# Patient Record
Sex: Female | Born: 1950
Health system: Southern US, Community
[De-identification: ages and names within clinical notes are randomized; demographics above are authoritative.]

## PROBLEM LIST (undated history)

## (undated) DIAGNOSIS — J45909 Unspecified asthma, uncomplicated: Secondary | ICD-10-CM

## (undated) DIAGNOSIS — R569 Unspecified convulsions: Secondary | ICD-10-CM

## (undated) DIAGNOSIS — C50919 Malignant neoplasm of unspecified site of unspecified female breast: Secondary | ICD-10-CM

## (undated) DIAGNOSIS — G35 Multiple sclerosis: Secondary | ICD-10-CM

## (undated) DIAGNOSIS — B019 Varicella without complication: Secondary | ICD-10-CM

## (undated) DIAGNOSIS — N183 Chronic kidney disease, stage 3 unspecified: Secondary | ICD-10-CM

## (undated) DIAGNOSIS — I10 Essential (primary) hypertension: Secondary | ICD-10-CM

## (undated) HISTORY — DX: Essential (primary) hypertension: I10

## (undated) HISTORY — DX: Chronic kidney disease, stage 3 unspecified: N18.30

## (undated) HISTORY — DX: Malignant neoplasm of unspecified site of unspecified female breast: C50.919

## (undated) HISTORY — DX: Varicella without complication: B01.9

## (undated) HISTORY — DX: Unspecified convulsions: R56.9

## (undated) HISTORY — DX: Unspecified asthma, uncomplicated: J45.909

## (undated) HISTORY — DX: Multiple sclerosis: G35

## (undated) HISTORY — PX: BREAST SURGERY: SHX581

---

## 2012-08-09 DIAGNOSIS — C50919 Malignant neoplasm of unspecified site of unspecified female breast: Secondary | ICD-10-CM

## 2012-08-09 HISTORY — DX: Malignant neoplasm of unspecified site of unspecified female breast: C50.919

## 2012-08-09 HISTORY — PX: MASTECTOMY: SHX3

## 2012-11-01 DIAGNOSIS — C50411 Malignant neoplasm of upper-outer quadrant of right female breast: Secondary | ICD-10-CM | POA: Insufficient documentation

## 2012-11-02 DIAGNOSIS — Z789 Other specified health status: Secondary | ICD-10-CM | POA: Insufficient documentation

## 2012-11-02 DIAGNOSIS — Z531 Procedure and treatment not carried out because of patient's decision for reasons of belief and group pressure: Secondary | ICD-10-CM | POA: Insufficient documentation

## 2015-06-02 DIAGNOSIS — M792 Neuralgia and neuritis, unspecified: Secondary | ICD-10-CM | POA: Insufficient documentation

## 2015-12-31 DIAGNOSIS — R569 Unspecified convulsions: Secondary | ICD-10-CM | POA: Insufficient documentation

## 2015-12-31 DIAGNOSIS — M79606 Pain in leg, unspecified: Secondary | ICD-10-CM | POA: Insufficient documentation

## 2015-12-31 DIAGNOSIS — G8929 Other chronic pain: Secondary | ICD-10-CM | POA: Insufficient documentation

## 2016-09-22 DIAGNOSIS — G4089 Other seizures: Secondary | ICD-10-CM | POA: Diagnosis not present

## 2016-09-22 DIAGNOSIS — M150 Primary generalized (osteo)arthritis: Secondary | ICD-10-CM | POA: Diagnosis not present

## 2016-09-22 DIAGNOSIS — I10 Essential (primary) hypertension: Secondary | ICD-10-CM | POA: Diagnosis not present

## 2016-09-29 DIAGNOSIS — Z832 Family history of diseases of the blood and blood-forming organs and certain disorders involving the immune mechanism: Secondary | ICD-10-CM | POA: Diagnosis not present

## 2016-09-29 DIAGNOSIS — D573 Sickle-cell trait: Secondary | ICD-10-CM | POA: Diagnosis not present

## 2016-09-29 DIAGNOSIS — Z79811 Long term (current) use of aromatase inhibitors: Secondary | ICD-10-CM | POA: Diagnosis not present

## 2016-09-29 DIAGNOSIS — M8580 Other specified disorders of bone density and structure, unspecified site: Secondary | ICD-10-CM | POA: Diagnosis not present

## 2016-09-29 DIAGNOSIS — C50111 Malignant neoplasm of central portion of right female breast: Secondary | ICD-10-CM | POA: Diagnosis not present

## 2016-09-29 DIAGNOSIS — Z9011 Acquired absence of right breast and nipple: Secondary | ICD-10-CM | POA: Diagnosis not present

## 2016-09-29 DIAGNOSIS — E559 Vitamin D deficiency, unspecified: Secondary | ICD-10-CM | POA: Diagnosis not present

## 2016-09-29 DIAGNOSIS — Z170 Estrogen receptor positive status [ER+]: Secondary | ICD-10-CM | POA: Diagnosis not present

## 2016-09-29 DIAGNOSIS — G35 Multiple sclerosis: Secondary | ICD-10-CM | POA: Diagnosis not present

## 2016-09-29 DIAGNOSIS — C50411 Malignant neoplasm of upper-outer quadrant of right female breast: Secondary | ICD-10-CM | POA: Diagnosis not present

## 2016-09-29 DIAGNOSIS — Z87891 Personal history of nicotine dependence: Secondary | ICD-10-CM | POA: Diagnosis not present

## 2016-09-29 DIAGNOSIS — G893 Neoplasm related pain (acute) (chronic): Secondary | ICD-10-CM | POA: Diagnosis not present

## 2016-09-29 DIAGNOSIS — G40909 Epilepsy, unspecified, not intractable, without status epilepticus: Secondary | ICD-10-CM | POA: Diagnosis not present

## 2016-09-29 DIAGNOSIS — J45909 Unspecified asthma, uncomplicated: Secondary | ICD-10-CM | POA: Diagnosis not present

## 2016-09-29 DIAGNOSIS — D649 Anemia, unspecified: Secondary | ICD-10-CM | POA: Diagnosis not present

## 2016-09-29 DIAGNOSIS — G629 Polyneuropathy, unspecified: Secondary | ICD-10-CM | POA: Diagnosis not present

## 2016-10-05 DIAGNOSIS — L84 Corns and callosities: Secondary | ICD-10-CM | POA: Diagnosis not present

## 2016-10-05 DIAGNOSIS — L608 Other nail disorders: Secondary | ICD-10-CM | POA: Diagnosis not present

## 2016-10-05 DIAGNOSIS — M2041 Other hammer toe(s) (acquired), right foot: Secondary | ICD-10-CM | POA: Diagnosis not present

## 2016-10-05 DIAGNOSIS — M79671 Pain in right foot: Secondary | ICD-10-CM | POA: Diagnosis not present

## 2016-10-15 DIAGNOSIS — Z124 Encounter for screening for malignant neoplasm of cervix: Secondary | ICD-10-CM | POA: Diagnosis not present

## 2016-10-15 DIAGNOSIS — I10 Essential (primary) hypertension: Secondary | ICD-10-CM | POA: Diagnosis not present

## 2016-10-15 DIAGNOSIS — Z853 Personal history of malignant neoplasm of breast: Secondary | ICD-10-CM | POA: Diagnosis not present

## 2016-10-15 DIAGNOSIS — G35 Multiple sclerosis: Secondary | ICD-10-CM | POA: Diagnosis not present

## 2016-12-14 DIAGNOSIS — L84 Corns and callosities: Secondary | ICD-10-CM | POA: Diagnosis not present

## 2016-12-14 DIAGNOSIS — M79671 Pain in right foot: Secondary | ICD-10-CM | POA: Diagnosis not present

## 2016-12-14 DIAGNOSIS — M2041 Other hammer toe(s) (acquired), right foot: Secondary | ICD-10-CM | POA: Diagnosis not present

## 2016-12-14 DIAGNOSIS — L608 Other nail disorders: Secondary | ICD-10-CM | POA: Diagnosis not present

## 2016-12-29 DIAGNOSIS — G35 Multiple sclerosis: Secondary | ICD-10-CM | POA: Diagnosis not present

## 2016-12-29 DIAGNOSIS — I10 Essential (primary) hypertension: Secondary | ICD-10-CM | POA: Diagnosis not present

## 2017-01-19 DIAGNOSIS — M6250 Muscle wasting and atrophy, not elsewhere classified, unspecified site: Secondary | ICD-10-CM | POA: Diagnosis not present

## 2017-01-19 DIAGNOSIS — R2689 Other abnormalities of gait and mobility: Secondary | ICD-10-CM | POA: Diagnosis not present

## 2017-01-19 DIAGNOSIS — G35 Multiple sclerosis: Secondary | ICD-10-CM | POA: Diagnosis not present

## 2017-01-26 DIAGNOSIS — R2689 Other abnormalities of gait and mobility: Secondary | ICD-10-CM | POA: Diagnosis not present

## 2017-01-26 DIAGNOSIS — G35 Multiple sclerosis: Secondary | ICD-10-CM | POA: Diagnosis not present

## 2017-01-26 DIAGNOSIS — M6250 Muscle wasting and atrophy, not elsewhere classified, unspecified site: Secondary | ICD-10-CM | POA: Diagnosis not present

## 2017-02-01 DIAGNOSIS — M6250 Muscle wasting and atrophy, not elsewhere classified, unspecified site: Secondary | ICD-10-CM | POA: Diagnosis not present

## 2017-02-01 DIAGNOSIS — R2689 Other abnormalities of gait and mobility: Secondary | ICD-10-CM | POA: Diagnosis not present

## 2017-02-01 DIAGNOSIS — G35 Multiple sclerosis: Secondary | ICD-10-CM | POA: Diagnosis not present

## 2017-02-08 DIAGNOSIS — G35 Multiple sclerosis: Secondary | ICD-10-CM | POA: Diagnosis not present

## 2017-02-08 DIAGNOSIS — R2689 Other abnormalities of gait and mobility: Secondary | ICD-10-CM | POA: Diagnosis not present

## 2017-02-08 DIAGNOSIS — M6250 Muscle wasting and atrophy, not elsewhere classified, unspecified site: Secondary | ICD-10-CM | POA: Diagnosis not present

## 2017-02-11 DIAGNOSIS — R2689 Other abnormalities of gait and mobility: Secondary | ICD-10-CM | POA: Diagnosis not present

## 2017-02-11 DIAGNOSIS — M6250 Muscle wasting and atrophy, not elsewhere classified, unspecified site: Secondary | ICD-10-CM | POA: Diagnosis not present

## 2017-02-11 DIAGNOSIS — G35 Multiple sclerosis: Secondary | ICD-10-CM | POA: Diagnosis not present

## 2017-02-15 DIAGNOSIS — R2689 Other abnormalities of gait and mobility: Secondary | ICD-10-CM | POA: Diagnosis not present

## 2017-02-15 DIAGNOSIS — G35 Multiple sclerosis: Secondary | ICD-10-CM | POA: Diagnosis not present

## 2017-02-15 DIAGNOSIS — M6250 Muscle wasting and atrophy, not elsewhere classified, unspecified site: Secondary | ICD-10-CM | POA: Diagnosis not present

## 2017-02-18 DIAGNOSIS — R2689 Other abnormalities of gait and mobility: Secondary | ICD-10-CM | POA: Diagnosis not present

## 2017-02-18 DIAGNOSIS — M6250 Muscle wasting and atrophy, not elsewhere classified, unspecified site: Secondary | ICD-10-CM | POA: Diagnosis not present

## 2017-02-18 DIAGNOSIS — G35 Multiple sclerosis: Secondary | ICD-10-CM | POA: Diagnosis not present

## 2017-02-22 DIAGNOSIS — M6250 Muscle wasting and atrophy, not elsewhere classified, unspecified site: Secondary | ICD-10-CM | POA: Diagnosis not present

## 2017-02-22 DIAGNOSIS — G35 Multiple sclerosis: Secondary | ICD-10-CM | POA: Diagnosis not present

## 2017-02-22 DIAGNOSIS — R2689 Other abnormalities of gait and mobility: Secondary | ICD-10-CM | POA: Diagnosis not present

## 2017-02-25 DIAGNOSIS — G35 Multiple sclerosis: Secondary | ICD-10-CM | POA: Diagnosis not present

## 2017-02-25 DIAGNOSIS — R2689 Other abnormalities of gait and mobility: Secondary | ICD-10-CM | POA: Diagnosis not present

## 2017-02-25 DIAGNOSIS — M6250 Muscle wasting and atrophy, not elsewhere classified, unspecified site: Secondary | ICD-10-CM | POA: Diagnosis not present

## 2017-03-31 DIAGNOSIS — C50911 Malignant neoplasm of unspecified site of right female breast: Secondary | ICD-10-CM | POA: Diagnosis not present

## 2017-03-31 DIAGNOSIS — Z853 Personal history of malignant neoplasm of breast: Secondary | ICD-10-CM | POA: Diagnosis not present

## 2017-04-06 DIAGNOSIS — G35 Multiple sclerosis: Secondary | ICD-10-CM | POA: Diagnosis not present

## 2017-04-06 DIAGNOSIS — Z6824 Body mass index (BMI) 24.0-24.9, adult: Secondary | ICD-10-CM | POA: Diagnosis not present

## 2017-04-06 DIAGNOSIS — I10 Essential (primary) hypertension: Secondary | ICD-10-CM | POA: Diagnosis not present

## 2017-04-07 DIAGNOSIS — G893 Neoplasm related pain (acute) (chronic): Secondary | ICD-10-CM | POA: Diagnosis not present

## 2017-04-07 DIAGNOSIS — G8928 Other chronic postprocedural pain: Secondary | ICD-10-CM | POA: Diagnosis not present

## 2017-04-07 DIAGNOSIS — R5383 Other fatigue: Secondary | ICD-10-CM | POA: Diagnosis not present

## 2017-04-07 DIAGNOSIS — C50411 Malignant neoplasm of upper-outer quadrant of right female breast: Secondary | ICD-10-CM | POA: Diagnosis not present

## 2017-04-07 DIAGNOSIS — N6459 Other signs and symptoms in breast: Secondary | ICD-10-CM | POA: Diagnosis not present

## 2017-04-07 DIAGNOSIS — D573 Sickle-cell trait: Secondary | ICD-10-CM | POA: Diagnosis not present

## 2017-04-07 DIAGNOSIS — Z87891 Personal history of nicotine dependence: Secondary | ICD-10-CM | POA: Diagnosis not present

## 2017-04-07 DIAGNOSIS — Z832 Family history of diseases of the blood and blood-forming organs and certain disorders involving the immune mechanism: Secondary | ICD-10-CM | POA: Diagnosis not present

## 2017-04-07 DIAGNOSIS — R208 Other disturbances of skin sensation: Secondary | ICD-10-CM | POA: Diagnosis not present

## 2017-04-07 DIAGNOSIS — M1990 Unspecified osteoarthritis, unspecified site: Secondary | ICD-10-CM | POA: Diagnosis not present

## 2017-04-07 DIAGNOSIS — M8580 Other specified disorders of bone density and structure, unspecified site: Secondary | ICD-10-CM | POA: Diagnosis not present

## 2017-04-07 DIAGNOSIS — J45909 Unspecified asthma, uncomplicated: Secondary | ICD-10-CM | POA: Diagnosis not present

## 2017-04-07 DIAGNOSIS — Z9221 Personal history of antineoplastic chemotherapy: Secondary | ICD-10-CM | POA: Diagnosis not present

## 2017-04-07 DIAGNOSIS — R110 Nausea: Secondary | ICD-10-CM | POA: Diagnosis not present

## 2017-04-07 DIAGNOSIS — Z170 Estrogen receptor positive status [ER+]: Secondary | ICD-10-CM | POA: Diagnosis not present

## 2017-04-07 DIAGNOSIS — C50111 Malignant neoplasm of central portion of right female breast: Secondary | ICD-10-CM | POA: Diagnosis not present

## 2017-04-07 DIAGNOSIS — Z79811 Long term (current) use of aromatase inhibitors: Secondary | ICD-10-CM | POA: Diagnosis not present

## 2017-04-07 DIAGNOSIS — Z9011 Acquired absence of right breast and nipple: Secondary | ICD-10-CM | POA: Diagnosis not present

## 2017-04-07 DIAGNOSIS — D649 Anemia, unspecified: Secondary | ICD-10-CM | POA: Diagnosis not present

## 2017-04-07 DIAGNOSIS — G629 Polyneuropathy, unspecified: Secondary | ICD-10-CM | POA: Diagnosis not present

## 2017-04-07 DIAGNOSIS — G35 Multiple sclerosis: Secondary | ICD-10-CM | POA: Diagnosis not present

## 2017-04-07 DIAGNOSIS — M8589 Other specified disorders of bone density and structure, multiple sites: Secondary | ICD-10-CM | POA: Diagnosis not present

## 2017-04-19 DIAGNOSIS — M47816 Spondylosis without myelopathy or radiculopathy, lumbar region: Secondary | ICD-10-CM | POA: Diagnosis not present

## 2017-04-19 DIAGNOSIS — R109 Unspecified abdominal pain: Secondary | ICD-10-CM | POA: Diagnosis not present

## 2017-04-19 DIAGNOSIS — M5136 Other intervertebral disc degeneration, lumbar region: Secondary | ICD-10-CM | POA: Diagnosis not present

## 2017-04-19 DIAGNOSIS — G8911 Acute pain due to trauma: Secondary | ICD-10-CM | POA: Diagnosis not present

## 2017-04-19 DIAGNOSIS — S2232XA Fracture of one rib, left side, initial encounter for closed fracture: Secondary | ICD-10-CM | POA: Diagnosis not present

## 2017-04-19 DIAGNOSIS — M545 Low back pain: Secondary | ICD-10-CM | POA: Diagnosis not present

## 2017-07-12 DIAGNOSIS — I10 Essential (primary) hypertension: Secondary | ICD-10-CM | POA: Diagnosis not present

## 2017-07-12 DIAGNOSIS — Z23 Encounter for immunization: Secondary | ICD-10-CM | POA: Diagnosis not present

## 2017-07-12 DIAGNOSIS — G35 Multiple sclerosis: Secondary | ICD-10-CM | POA: Diagnosis not present

## 2017-07-12 DIAGNOSIS — Z6823 Body mass index (BMI) 23.0-23.9, adult: Secondary | ICD-10-CM | POA: Diagnosis not present

## 2017-07-14 DIAGNOSIS — Z780 Asymptomatic menopausal state: Secondary | ICD-10-CM | POA: Diagnosis not present

## 2017-07-14 DIAGNOSIS — M810 Age-related osteoporosis without current pathological fracture: Secondary | ICD-10-CM | POA: Diagnosis not present

## 2017-07-14 DIAGNOSIS — M8589 Other specified disorders of bone density and structure, multiple sites: Secondary | ICD-10-CM | POA: Diagnosis not present

## 2017-07-14 DIAGNOSIS — Z13820 Encounter for screening for osteoporosis: Secondary | ICD-10-CM | POA: Diagnosis not present

## 2017-07-14 LAB — HM DEXA SCAN

## 2017-10-06 DIAGNOSIS — M8580 Other specified disorders of bone density and structure, unspecified site: Secondary | ICD-10-CM | POA: Diagnosis not present

## 2017-10-06 DIAGNOSIS — R634 Abnormal weight loss: Secondary | ICD-10-CM | POA: Diagnosis not present

## 2017-10-06 DIAGNOSIS — D573 Sickle-cell trait: Secondary | ICD-10-CM | POA: Diagnosis not present

## 2017-10-06 DIAGNOSIS — Z170 Estrogen receptor positive status [ER+]: Secondary | ICD-10-CM | POA: Diagnosis not present

## 2017-10-06 DIAGNOSIS — R0781 Pleurodynia: Secondary | ICD-10-CM | POA: Diagnosis not present

## 2017-10-06 DIAGNOSIS — R079 Chest pain, unspecified: Secondary | ICD-10-CM | POA: Diagnosis not present

## 2017-10-06 DIAGNOSIS — G893 Neoplasm related pain (acute) (chronic): Secondary | ICD-10-CM | POA: Diagnosis not present

## 2017-10-06 DIAGNOSIS — Z79811 Long term (current) use of aromatase inhibitors: Secondary | ICD-10-CM | POA: Diagnosis not present

## 2017-10-06 DIAGNOSIS — C50411 Malignant neoplasm of upper-outer quadrant of right female breast: Secondary | ICD-10-CM | POA: Diagnosis not present

## 2017-10-19 DIAGNOSIS — G35 Multiple sclerosis: Secondary | ICD-10-CM | POA: Diagnosis not present

## 2017-10-19 DIAGNOSIS — I10 Essential (primary) hypertension: Secondary | ICD-10-CM | POA: Diagnosis not present

## 2017-10-19 DIAGNOSIS — Z6824 Body mass index (BMI) 24.0-24.9, adult: Secondary | ICD-10-CM | POA: Diagnosis not present

## 2017-10-19 DIAGNOSIS — G4089 Other seizures: Secondary | ICD-10-CM | POA: Diagnosis not present

## 2017-10-19 DIAGNOSIS — C50911 Malignant neoplasm of unspecified site of right female breast: Secondary | ICD-10-CM | POA: Diagnosis not present

## 2017-12-08 DIAGNOSIS — J45909 Unspecified asthma, uncomplicated: Secondary | ICD-10-CM | POA: Diagnosis not present

## 2017-12-08 DIAGNOSIS — Z1211 Encounter for screening for malignant neoplasm of colon: Secondary | ICD-10-CM | POA: Diagnosis not present

## 2017-12-08 DIAGNOSIS — F17210 Nicotine dependence, cigarettes, uncomplicated: Secondary | ICD-10-CM | POA: Diagnosis not present

## 2017-12-08 DIAGNOSIS — K5730 Diverticulosis of large intestine without perforation or abscess without bleeding: Secondary | ICD-10-CM | POA: Diagnosis not present

## 2017-12-08 LAB — HM COLONOSCOPY

## 2018-03-12 DIAGNOSIS — K429 Umbilical hernia without obstruction or gangrene: Secondary | ICD-10-CM | POA: Diagnosis not present

## 2018-03-12 DIAGNOSIS — M8580 Other specified disorders of bone density and structure, unspecified site: Secondary | ICD-10-CM | POA: Diagnosis not present

## 2018-03-12 DIAGNOSIS — Z720 Tobacco use: Secondary | ICD-10-CM | POA: Diagnosis not present

## 2018-03-12 DIAGNOSIS — N12 Tubulo-interstitial nephritis, not specified as acute or chronic: Secondary | ICD-10-CM | POA: Diagnosis not present

## 2018-03-12 DIAGNOSIS — H9203 Otalgia, bilateral: Secondary | ICD-10-CM | POA: Diagnosis not present

## 2018-03-12 DIAGNOSIS — D649 Anemia, unspecified: Secondary | ICD-10-CM | POA: Diagnosis not present

## 2018-03-12 DIAGNOSIS — Z853 Personal history of malignant neoplasm of breast: Secondary | ICD-10-CM | POA: Diagnosis not present

## 2018-03-12 DIAGNOSIS — M1990 Unspecified osteoarthritis, unspecified site: Secondary | ICD-10-CM | POA: Diagnosis not present

## 2018-03-12 DIAGNOSIS — G629 Polyneuropathy, unspecified: Secondary | ICD-10-CM | POA: Diagnosis not present

## 2018-03-12 DIAGNOSIS — G35 Multiple sclerosis: Secondary | ICD-10-CM | POA: Diagnosis not present

## 2018-03-12 DIAGNOSIS — J45909 Unspecified asthma, uncomplicated: Secondary | ICD-10-CM | POA: Diagnosis not present

## 2018-03-12 DIAGNOSIS — R109 Unspecified abdominal pain: Secondary | ICD-10-CM | POA: Diagnosis not present

## 2018-03-12 DIAGNOSIS — I10 Essential (primary) hypertension: Secondary | ICD-10-CM | POA: Diagnosis not present

## 2018-03-12 DIAGNOSIS — D571 Sickle-cell disease without crisis: Secondary | ICD-10-CM | POA: Diagnosis not present

## 2018-03-12 DIAGNOSIS — R569 Unspecified convulsions: Secondary | ICD-10-CM | POA: Diagnosis not present

## 2018-03-12 DIAGNOSIS — K449 Diaphragmatic hernia without obstruction or gangrene: Secondary | ICD-10-CM | POA: Diagnosis not present

## 2018-03-12 DIAGNOSIS — N10 Acute tubulo-interstitial nephritis: Secondary | ICD-10-CM | POA: Diagnosis not present

## 2018-04-03 DIAGNOSIS — Z170 Estrogen receptor positive status [ER+]: Secondary | ICD-10-CM | POA: Diagnosis not present

## 2018-04-03 DIAGNOSIS — G629 Polyneuropathy, unspecified: Secondary | ICD-10-CM | POA: Diagnosis not present

## 2018-04-03 DIAGNOSIS — D573 Sickle-cell trait: Secondary | ICD-10-CM | POA: Diagnosis not present

## 2018-04-03 DIAGNOSIS — G893 Neoplasm related pain (acute) (chronic): Secondary | ICD-10-CM | POA: Diagnosis not present

## 2018-04-03 DIAGNOSIS — M8589 Other specified disorders of bone density and structure, multiple sites: Secondary | ICD-10-CM | POA: Diagnosis not present

## 2018-04-03 DIAGNOSIS — J45909 Unspecified asthma, uncomplicated: Secondary | ICD-10-CM | POA: Diagnosis not present

## 2018-04-03 DIAGNOSIS — D649 Anemia, unspecified: Secondary | ICD-10-CM | POA: Diagnosis not present

## 2018-04-03 DIAGNOSIS — C50111 Malignant neoplasm of central portion of right female breast: Secondary | ICD-10-CM | POA: Diagnosis not present

## 2018-04-03 DIAGNOSIS — G35 Multiple sclerosis: Secondary | ICD-10-CM | POA: Diagnosis not present

## 2018-04-03 DIAGNOSIS — M1990 Unspecified osteoarthritis, unspecified site: Secondary | ICD-10-CM | POA: Diagnosis not present

## 2018-04-03 DIAGNOSIS — Z1231 Encounter for screening mammogram for malignant neoplasm of breast: Secondary | ICD-10-CM | POA: Diagnosis not present

## 2018-04-03 DIAGNOSIS — Z79811 Long term (current) use of aromatase inhibitors: Secondary | ICD-10-CM | POA: Diagnosis not present

## 2018-04-03 DIAGNOSIS — Z9011 Acquired absence of right breast and nipple: Secondary | ICD-10-CM | POA: Diagnosis not present

## 2018-04-03 DIAGNOSIS — C50411 Malignant neoplasm of upper-outer quadrant of right female breast: Secondary | ICD-10-CM | POA: Diagnosis not present

## 2018-04-03 DIAGNOSIS — R569 Unspecified convulsions: Secondary | ICD-10-CM | POA: Diagnosis not present

## 2018-04-03 DIAGNOSIS — I10 Essential (primary) hypertension: Secondary | ICD-10-CM | POA: Diagnosis not present

## 2018-04-03 DIAGNOSIS — M8580 Other specified disorders of bone density and structure, unspecified site: Secondary | ICD-10-CM | POA: Diagnosis not present

## 2018-08-26 DIAGNOSIS — G629 Polyneuropathy, unspecified: Secondary | ICD-10-CM | POA: Diagnosis not present

## 2018-08-26 DIAGNOSIS — M19071 Primary osteoarthritis, right ankle and foot: Secondary | ICD-10-CM | POA: Diagnosis not present

## 2018-08-26 DIAGNOSIS — I10 Essential (primary) hypertension: Secondary | ICD-10-CM | POA: Diagnosis not present

## 2018-08-26 DIAGNOSIS — M79671 Pain in right foot: Secondary | ICD-10-CM | POA: Diagnosis not present

## 2018-08-26 DIAGNOSIS — Z87891 Personal history of nicotine dependence: Secondary | ICD-10-CM | POA: Diagnosis not present

## 2018-08-26 DIAGNOSIS — G8911 Acute pain due to trauma: Secondary | ICD-10-CM | POA: Diagnosis not present

## 2018-08-26 DIAGNOSIS — S9031XA Contusion of right foot, initial encounter: Secondary | ICD-10-CM | POA: Diagnosis not present

## 2018-08-26 DIAGNOSIS — Z7982 Long term (current) use of aspirin: Secondary | ICD-10-CM | POA: Diagnosis not present

## 2018-08-26 DIAGNOSIS — G35 Multiple sclerosis: Secondary | ICD-10-CM | POA: Diagnosis not present

## 2018-08-26 DIAGNOSIS — Z79899 Other long term (current) drug therapy: Secondary | ICD-10-CM | POA: Diagnosis not present

## 2018-09-15 ENCOUNTER — Other Ambulatory Visit: Payer: Self-pay

## 2018-09-15 ENCOUNTER — Ambulatory Visit (INDEPENDENT_AMBULATORY_CARE_PROVIDER_SITE_OTHER): Payer: Medicare Other | Admitting: Family Medicine

## 2018-09-15 ENCOUNTER — Encounter: Payer: Self-pay | Admitting: Family Medicine

## 2018-09-15 VITALS — BP 152/75 | HR 79 | Temp 97.7°F | Ht 66.0 in | Wt 145.6 lb

## 2018-09-15 DIAGNOSIS — C50911 Malignant neoplasm of unspecified site of right female breast: Secondary | ICD-10-CM

## 2018-09-15 DIAGNOSIS — I10 Essential (primary) hypertension: Secondary | ICD-10-CM | POA: Diagnosis not present

## 2018-09-15 DIAGNOSIS — Z23 Encounter for immunization: Secondary | ICD-10-CM

## 2018-09-15 DIAGNOSIS — G40109 Localization-related (focal) (partial) symptomatic epilepsy and epileptic syndromes with simple partial seizures, not intractable, without status epilepticus: Secondary | ICD-10-CM | POA: Insufficient documentation

## 2018-09-15 DIAGNOSIS — R269 Unspecified abnormalities of gait and mobility: Secondary | ICD-10-CM | POA: Insufficient documentation

## 2018-09-15 DIAGNOSIS — G35 Multiple sclerosis: Secondary | ICD-10-CM | POA: Diagnosis not present

## 2018-09-15 DIAGNOSIS — F40240 Claustrophobia: Secondary | ICD-10-CM | POA: Insufficient documentation

## 2018-09-15 DIAGNOSIS — R202 Paresthesia of skin: Secondary | ICD-10-CM | POA: Insufficient documentation

## 2018-09-15 DIAGNOSIS — Z5181 Encounter for therapeutic drug level monitoring: Secondary | ICD-10-CM | POA: Diagnosis not present

## 2018-09-15 MED ORDER — GABAPENTIN 300 MG PO CAPS: 600.0000 mg | ORAL_CAPSULE | Freq: Two times a day (BID) | ORAL | 3 refills | Status: DC

## 2018-09-15 MED ORDER — LAMOTRIGINE 100 MG PO TABS: 100.0000 mg | ORAL_TABLET | Freq: Every day | ORAL | 0 refills | Status: DC

## 2018-09-15 MED ORDER — OXYCODONE-ACETAMINOPHEN 5-325 MG PO TABS: 1.0000 | ORAL_TABLET | Freq: Three times a day (TID) | ORAL | 0 refills | Status: DC | PRN

## 2018-09-15 MED ORDER — LOSARTAN POTASSIUM 100 MG PO TABS: 100.0000 mg | ORAL_TABLET | Freq: Every day | ORAL | 1 refills | Status: DC

## 2018-09-15 MED ORDER — VITAMIN D3 25 MCG (1000 UNIT) PO TABS: 1000.0000 [IU] | ORAL_TABLET | Freq: Every day | ORAL | 3 refills | Status: DC

## 2018-09-15 MED ORDER — AMLODIPINE BESYLATE 5 MG PO TABS: 5.0000 mg | ORAL_TABLET | Freq: Every day | ORAL | 1 refills | Status: DC

## 2018-09-15 NOTE — Patient Instructions (Signed)
° ° ° °  If you have lab work done today you will be contacted with your lab results within the next 2 weeks.  If you have not heard from us then please contact us. The fastest way to get your results is to register for My Chart. ° ° °IF you received an x-ray today, you will receive an invoice from [facility]. Please contact [facility] at [phone] with questions or concerns regarding your invoice.  ° °IF you received labwork today, you will receive an invoice from [facility]. Please contact [facility] at [phone] with questions or concerns regarding your invoice.  ° °Our billing staff will not be able to assist you with questions regarding bills from these companies. ° °You will be contacted with the lab results as soon as they are available. The fastest way to get your results is to activate your My Chart account. Instructions are located on the last page of this paperwork. If you have not heard from us regarding the results in 2 weeks, please contact this office. °  ° ° ° °

## 2018-09-15 NOTE — Progress Notes (Signed)
2/7/202011:47 AM  [place] [birth year], 68 y.o. female [id number]  Chief Complaint  Patient presents with  . Establish Care    needs refill on all meds, moving to [place] from [place]. Will sign consent for records    HPI:   Patient is a 68 y.o. female with past medical history significant for HTN, breast cancer right in 11-02-12, seizure disorder, Multiple sclerosis who presents today for to establish care  Moving from [place] to [place] to be closer to family After her husband died in 02-Nov-2016 Has a daughter in [place] and a son in [place]  Had [place] and chemo x 1 year for breast cancer, right Last mammogram in 2017-11-02 Sees onc feb 25th in [place], will need referral to med onc  seziures started couple years before MS was diagnosed Trial off lamictal lead to return of seizures Has not had seizure since she resumed lamictal  Used to be on copaxone for MS, had issues with pharmaceutical Last MS meds about 3 years ago Last MRI about 2 years ago, needs valium before MRI Last MRI was stable Uses cane Has nueropathic pain from MS, mostly right leg No known relapse  Takes oxycodone mostly for sign pain at masectomy scar and it also helps with her neuropathic pain Takes prn pmp reviewed  HTN - has been losartan for about 3 years Took BP med today Checks BP occ at home, reports 150/70s Has never tried any other BP meds Mother and sister both have HTN and ESRD on HD Patient has been told she has decreased kidney function  Requesting flu vaccine, gets every year  Fall Risk  09/15/2018  Falls in the past year? 0  Number falls in past yr: 0  Injury with Fall? 0  Risk for fall due to : Impaired vision;Impaired balance/gait     Depression screen [facility] 2/9 09/15/2018  Decreased Interest 0  Down, Depressed, Hopeless 0  PHQ - 2 Score 0    No Known Allergies  Prior to Admission medications   Not on File  meds reviewed and updated in epic  Past Medical History:    Diagnosis Date  . Breast cancer ([place])   . Hypertension   . Multiple sclerosis ([place])   . Seizures ([place])     Past Surgical History:  Procedure Laterality Date  . BREAST SURGERY      Social History   Tobacco Use  . Smoking status: Not on file  Substance Use Topics  . Alcohol use: Not on file    No family history on file.  Review of Systems  Constitutional: Negative for chills and fever.  Eyes: Negative for blurred vision and double vision.  Respiratory: Negative for cough and shortness of breath.   Cardiovascular: Negative for chest pain, palpitations and leg swelling.  Gastrointestinal: Negative for abdominal pain, nausea and vomiting.  Neurological: Positive for tingling, focal weakness and seizures.  All other systems reviewed and are negative. per hpi  OBJECTIVE:  Blood pressure (!) 152/75, pulse 79, temperature 97.7 F (36.5 C), temperature source Oral, height 5\' 6"  (1.676 m), weight 145 lb 9.6 oz (66 kg), SpO2 98 %. Body mass index is 23.5 kg/m.   Physical Exam Vitals signs and nursing note reviewed.  Constitutional:      General: She is not in acute distress.    Appearance: She is well-developed.  HENT:     Head: Normocephalic and atraumatic.     Mouth/Throat:     Pharynx: No oropharyngeal exudate.  Eyes:     General: No scleral icterus.    Conjunctiva/sclera: Conjunctivae normal.     Pupils: Pupils are equal, round, and reactive to light.  Neck:     Musculoskeletal: Neck supple.  Cardiovascular:     Rate and Rhythm: Normal rate and regular rhythm.     Heart sounds: Normal heart sounds. No murmur. No friction rub. No gallop.   Pulmonary:     Effort: Pulmonary effort is normal.     Breath sounds: Normal breath sounds. No wheezing or rales.  Abdominal:     General: Bowel sounds are normal. There is no distension.     Palpations: Abdomen is soft. There is no mass.     Tenderness: There is no abdominal tenderness.  Musculoskeletal:     Right lower  leg: No edema.     Left lower leg: No edema.  Lymphadenopathy:     Cervical: No cervical adenopathy.  Skin:    General: Skin is warm and dry.  Neurological:     Mental Status: She is alert and oriented to person, place, and time.     Gait: Gait abnormal.  Psychiatric:        Mood and Affect: Mood normal.     ASSESSMENT and PLAN  1. Essential hypertension Uncontrolled. Adding amlodipine, reviewed r/se/b. Cont losartan.  - CBC - Comprehensive metabolic panel - Lipid panel - TSH  2. Need for prophylactic vaccination and inoculation against influenza - Flu vaccine HIGH DOSE PF  3. Localization-related epilepsy ([place]) Stable on lamictal. Checking labs. Med refilled. Referral to neuro - CBC - Comprehensive metabolic panel - Lamotrigine level - Ambulatory referral to Neurology  4. Multiple sclerosis ([place]) Patient currently not on any medications. Referring to neuro.  Refilled gabapentin  - Ambulatory referral to Neurology  5. Malignant neoplasm of right female breast, unspecified estrogen receptor status, unspecified site of breast [facility]) Patient has upcoming appt with current onc. Referring so that patient may establish locally, referred percocet, takes very prn, pmp reviewed - Ambulatory referral to Hematology / Oncology  6. Medication monitoring encounter - CBC - Comprehensive metabolic panel - Lamotrigine level  PMH, PSH, meds, allergies, Fhx, Shx, reviewed with patient today.  Other orders - gabapentin (NEURONTIN) 300 MG capsule; Take 2 capsules (600 mg total) by mouth 2 (two) times daily. - oxyCODONE-acetaminophen (PERCOCET/ROXICET) 5-325 MG tablet; Take 1 tablet by mouth every 8 (eight) hours as needed for severe pain. - lamoTRIgine (LAMICTAL) 100 MG tablet; Take 1 tablet (100 mg total) by mouth daily. - losartan (COZAAR) 100 MG tablet; Take 1 tablet (100 mg total) by mouth daily. - amLODipine (NORVASC) 5 MG tablet; Take 1 tablet (5 mg total) by mouth daily.     Return in about 3 months (around 12/14/2018).    Rutherford Guys, MD Primary Care at [place], [place] [address] Ph.  [phone] Fax [phone]

## 2018-09-18 ENCOUNTER — Telehealth: Payer: Self-pay | Admitting: Hematology and Oncology

## 2018-09-18 LAB — LIPID PANEL
Chol/HDL Ratio: 2.3 ratio (ref 0.0–4.4)
Cholesterol, Total: 175 mg/dL (ref 100–199)
HDL: 76 mg/dL (ref >39)
LDL Calculated: 87 mg/dL (ref 0–99)
Triglycerides: 60 mg/dL (ref 0–149)
VLDL Cholesterol Cal: 12 mg/dL (ref 5–40)

## 2018-09-18 LAB — COMPREHENSIVE METABOLIC PANEL
ALT: 12 IU/L (ref 0–32)
AST: 20 IU/L (ref 0–40)
Albumin/Globulin Ratio: 1.4 (ref 1.2–2.2)
Albumin: 4.2 g/dL (ref 3.8–4.8)
Alkaline Phosphatase: 54 IU/L (ref 39–117)
BUN/Creatinine Ratio: 15 (ref 12–28)
BUN: 20 mg/dL (ref 8–27)
Bilirubin Total: 0.5 mg/dL (ref 0.0–1.2)
CO2: 18 mmol/L — ABNORMAL LOW (ref 20–29)
Calcium: 9.6 mg/dL (ref 8.7–10.3)
Chloride: 106 mmol/L (ref 96–106)
Creatinine, Ser: 1.37 mg/dL — ABNORMAL HIGH (ref 0.57–1.00)
GFR calc Af Amer: 46 mL/min/{1.73_m2} — ABNORMAL LOW (ref >59)
GFR calc non Af Amer: 40 mL/min/{1.73_m2} — ABNORMAL LOW (ref >59)
Globulin, Total: 3 g/dL (ref 1.5–4.5)
Glucose: 82 mg/dL (ref 65–99)
Potassium: 4.5 mmol/L (ref 3.5–5.2)
Sodium: 141 mmol/L (ref 134–144)
Total Protein: 7.2 g/dL (ref 6.0–8.5)

## 2018-09-18 LAB — TSH: TSH: 0.583 u[IU]/mL (ref 0.450–4.500)

## 2018-09-18 LAB — CBC
Hematocrit: 33.3 % — ABNORMAL LOW (ref 34.0–46.6)
Hemoglobin: 11.3 g/dL (ref 11.1–15.9)
MCH: 27.6 pg (ref 26.6–33.0)
MCHC: 33.9 g/dL (ref 31.5–35.7)
MCV: 81 fL (ref 79–97)
Platelets: 204 10*3/uL (ref 150–450)
RBC: 4.09 x10E6/uL (ref 3.77–5.28)
RDW: 13.9 % (ref 11.7–15.4)
WBC: 5.6 10*3/uL (ref 3.4–10.8)

## 2018-09-18 LAB — LAMOTRIGINE LEVEL: Lamotrigine Lvl: 6.3 ug/mL (ref 2.0–20.0)

## 2018-09-18 NOTE — Telephone Encounter (Signed)
Received a new patient appt has been scheduled for the pt to see Dr. Lindi Adie on 3/4 at 1pm. Pt aware to arrive 30 minutes early.

## 2018-10-04 DIAGNOSIS — Z79811 Long term (current) use of aromatase inhibitors: Secondary | ICD-10-CM | POA: Diagnosis not present

## 2018-10-04 DIAGNOSIS — G893 Neoplasm related pain (acute) (chronic): Secondary | ICD-10-CM | POA: Diagnosis not present

## 2018-10-04 DIAGNOSIS — I10 Essential (primary) hypertension: Secondary | ICD-10-CM | POA: Diagnosis not present

## 2018-10-04 DIAGNOSIS — M1990 Unspecified osteoarthritis, unspecified site: Secondary | ICD-10-CM | POA: Diagnosis not present

## 2018-10-04 DIAGNOSIS — Z7982 Long term (current) use of aspirin: Secondary | ICD-10-CM | POA: Diagnosis not present

## 2018-10-04 DIAGNOSIS — Z170 Estrogen receptor positive status [ER+]: Secondary | ICD-10-CM | POA: Diagnosis not present

## 2018-10-04 DIAGNOSIS — Z87891 Personal history of nicotine dependence: Secondary | ICD-10-CM | POA: Diagnosis not present

## 2018-10-04 DIAGNOSIS — J45909 Unspecified asthma, uncomplicated: Secondary | ICD-10-CM | POA: Diagnosis not present

## 2018-10-04 DIAGNOSIS — M8580 Other specified disorders of bone density and structure, unspecified site: Secondary | ICD-10-CM | POA: Diagnosis not present

## 2018-10-04 DIAGNOSIS — G35 Multiple sclerosis: Secondary | ICD-10-CM | POA: Diagnosis not present

## 2018-10-04 DIAGNOSIS — Z9011 Acquired absence of right breast and nipple: Secondary | ICD-10-CM | POA: Diagnosis not present

## 2018-10-04 DIAGNOSIS — D573 Sickle-cell trait: Secondary | ICD-10-CM | POA: Diagnosis not present

## 2018-10-04 DIAGNOSIS — C50411 Malignant neoplasm of upper-outer quadrant of right female breast: Secondary | ICD-10-CM | POA: Diagnosis not present

## 2018-10-11 ENCOUNTER — Ambulatory Visit: Payer: Self-pay | Admitting: Hematology and Oncology

## 2018-10-11 NOTE — Assessment & Plan Note (Signed)
At [facility]  11/02/2012:Mastectomy simple Right and axillary lymph node dissection: Grade 3 IDC with DCIS 1.8 cm, 0/3 lymph nodes negative, ER PR positive HER-2 -3+ by IHC, T1c N0 stage Ia  Taxol Herceptin weekly x12 followed by Herceptin maintenance for 1 year Current treatment:  Cancer surveillance: 1.  Mammogram left breast 2.  Breast exam 10/11/2018: Benign  Return to clinic in 1 year for follow-up

## 2018-10-12 ENCOUNTER — Encounter: Payer: Self-pay | Admitting: Neurology

## 2018-11-22 ENCOUNTER — Telehealth: Payer: Self-pay | Admitting: Family Medicine

## 2018-11-22 MED ORDER — OXYCODONE-ACETAMINOPHEN 5-325 MG PO TABS: 1.0000 | ORAL_TABLET | Freq: Three times a day (TID) | ORAL | 0 refills | Status: DC | PRN

## 2018-11-22 NOTE — Telephone Encounter (Signed)
pmp reviewed  Med refilled 

## 2018-11-22 NOTE — Telephone Encounter (Signed)
Copied from [place] [id number]. Topic: Quick Communication - Rx Refill/Question >> Nov 22, 2018  4:02 PM Nils Flack, Marland Kitchen wrote: Medication: oxyCODONE-acetaminophen (PERCOCET/ROXICET) 5-325 MG tablet  Has the patient contacted their pharmacy? yes (Agent: If no, request that the patient contact the pharmacy for the refill.) (Agent: If yes, when and what did the pharmacy advise?)  Preferred Pharmacy (with phone number or street name): [facility] [address]   Agent: Please be advised that RX refills may take up to 3 business days. We ask that you follow-up with your pharmacy.

## 2018-11-22 NOTE — Telephone Encounter (Signed)
Please refill is appropriate 

## 2018-12-01 ENCOUNTER — Encounter: Payer: Self-pay | Admitting: Neurology

## 2018-12-06 ENCOUNTER — Other Ambulatory Visit: Payer: Self-pay | Admitting: Family Medicine

## 2018-12-12 ENCOUNTER — Ambulatory Visit: Payer: Self-pay | Admitting: Neurology

## 2018-12-15 ENCOUNTER — Ambulatory Visit: Payer: Medicare Other | Admitting: Family Medicine

## 2018-12-15 ENCOUNTER — Ambulatory Visit: Payer: Self-pay | Admitting: Neurology

## 2019-01-05 ENCOUNTER — Telehealth (INDEPENDENT_AMBULATORY_CARE_PROVIDER_SITE_OTHER): Payer: Medicare Other | Admitting: Family Medicine

## 2019-01-05 ENCOUNTER — Other Ambulatory Visit: Payer: Self-pay

## 2019-01-05 DIAGNOSIS — C50911 Malignant neoplasm of unspecified site of right female breast: Secondary | ICD-10-CM

## 2019-01-05 DIAGNOSIS — M792 Neuralgia and neuritis, unspecified: Secondary | ICD-10-CM

## 2019-01-05 DIAGNOSIS — I10 Essential (primary) hypertension: Secondary | ICD-10-CM | POA: Diagnosis not present

## 2019-01-05 DIAGNOSIS — G35 Multiple sclerosis: Secondary | ICD-10-CM

## 2019-01-05 DIAGNOSIS — G40109 Localization-related (focal) (partial) symptomatic epilepsy and epileptic syndromes with simple partial seizures, not intractable, without status epilepticus: Secondary | ICD-10-CM

## 2019-01-05 DIAGNOSIS — Z5181 Encounter for therapeutic drug level monitoring: Secondary | ICD-10-CM

## 2019-01-05 MED ORDER — LOSARTAN POTASSIUM 100 MG PO TABS: 100.0000 mg | ORAL_TABLET | Freq: Every day | ORAL | 1 refills | Status: DC

## 2019-01-05 MED ORDER — OXYCODONE-ACETAMINOPHEN 5-325 MG PO TABS: 1.0000 | ORAL_TABLET | Freq: Three times a day (TID) | ORAL | 0 refills | Status: DC | PRN

## 2019-01-05 MED ORDER — LORATADINE 10 MG PO TABS: 10.0000 mg | ORAL_TABLET | Freq: Every day | ORAL | 11 refills | Status: DC

## 2019-01-05 MED ORDER — LAMOTRIGINE 100 MG PO TABS: 100.0000 mg | ORAL_TABLET | Freq: Every day | ORAL | 0 refills | Status: DC

## 2019-01-05 NOTE — Progress Notes (Signed)
Virtual Visit Note  I connected with patient on 01/05/19 at 1238pm by phone and verified that I am speaking with the correct person using two identifiers. Selena Bender is currently located at home and patient is currently with them during visit. The provider, Rutherford Guys, MD is located in their office at time of visit.  I discussed the limitations, risks, security and privacy concerns of performing an evaluation and management service by telephone and the availability of in person appointments. I also discussed with the patient that there may be a patient responsible charge related to this service. The patient expressed understanding and agreed to proceed.   CC: routine followup  HPI ? Patient is a 68 y.o. female with past medical history significant for HTN, breast cancer right in 2014, seizure disorder, Multiple sclerosis who presents today for routine followup  Last OV feb 2020 Added amlodipine Referred to neuro and onc  Has not completed move to [place] Requesting we change her address to [place] (her cousin) were she is staying currently  Saw onc in march 2020, stable, followup 1 year Has not seen neurology yet, has appt in June  Checks BP at home Has been doing well, < 140/90 Having intermittent swelling with amlodipine of left ankle She does not want to change meds at this time  Chronic pain from MS and breast cancer scar Takes gabapentin and oxycodone only as needed Works for her pmp reviewed  Has not had any seizures  Requesting medication for seasonal allergies Has done well with loratidine in the past  Last labs in feb 2020 unremarkable x GFR 46  No Known Allergies  Prior to Admission medications   Medication Sig Start Date End Date Taking? Authorizing Provider  amLODipine (NORVASC) 5 MG tablet Take 1 tablet (5 mg total) by mouth daily. 09/15/18   Rutherford Guys, MD  ascorbic acid (VITAMIN C) 500 MG/5ML syrup Take by mouth daily.    [provider]  cholecalciferol (VITAMIN D) 25 MCG (1000 UT) tablet Take 1 tablet (1,000 Units total) by mouth daily. 09/15/18   Rutherford Guys, MD  gabapentin (NEURONTIN) 300 MG capsule Take 2 capsules (600 mg total) by mouth 2 (two) times daily. 09/15/18   Rutherford Guys, MD  lamoTRIgine (LAMICTAL) 100 MG tablet TAKE 1 TABLET BY MOUTH EVERY DAY 12/07/18   Rutherford Guys, MD  losartan (COZAAR) 100 MG tablet Take 1 tablet (100 mg total) by mouth daily. 09/15/18   Rutherford Guys, MD  oxyCODONE-acetaminophen (PERCOCET/ROXICET) 5-325 MG tablet Take 1 tablet by mouth every 8 (eight) hours as needed for severe pain. 11/22/18   Rutherford Guys, MD    Past Medical History:  Diagnosis Date  . Breast cancer ([place])   . Hypertension   . Multiple sclerosis ([place])   . Seizures ([place])     Past Surgical History:  Procedure Laterality Date  . BREAST SURGERY      Social History   Tobacco Use  . Smoking status: Former Smoker    Types: Cigarettes  . Smokeless tobacco: Never Used  Substance Use Topics  . Alcohol use: Yes    Family History  Problem Relation Age of Onset  . Sickle cell anemia Father     Review of Systems  Constitutional: Negative for chills and fever.  Respiratory: Negative for cough and shortness of breath.   Cardiovascular: Negative for chest pain, palpitations and leg swelling.  Gastrointestinal: Negative for abdominal pain, nausea and vomiting.  Objective  Vitals as reported by the patient: none   ASSESSMENT and PLAN  1. Essential hypertension Controlled. Discussed changing amlodipine due to swelling, at this time declines, as not constant and BP well controlled.  2. Localization-related epilepsy ([place]) Controlled. Continue current regime. lamictal level at goal in feb 2020.  3. Neuropathic pain Controlled. Continue current regime. pmp reviewed. Med refilled.  4. Multiple sclerosis ([place]) Has upcoming appt with neuro to establish care  5. Malignant neoplasm of right  female breast, unspecified estrogen receptor status, unspecified site of breast ([place]) Under onc care, seen once a year.   6. Medication monitoring encounter  Other orders - oxyCODONE-acetaminophen (PERCOCET/ROXICET) 5-325 MG tablet; Take 1 tablet by mouth every 8 (eight) hours as needed for severe pain. - losartan (COZAAR) 100 MG tablet; Take 1 tablet (100 mg total) by mouth daily. - lamoTRIgine (LAMICTAL) 100 MG tablet; Take 1 tablet (100 mg total) by mouth daily. - loratadine (CLARITIN) 10 MG tablet; Take 1 tablet (10 mg total) by mouth daily.  FOLLOW-UP: 3 months   The above assessment and management plan was discussed with the patient. The patient verbalized understanding of and has agreed to the management plan. Patient is aware to call the clinic if symptoms persist or worsen. Patient is aware when to return to the clinic for a follow-up visit. Patient educated on when it is appropriate to go to the emergency department.    I provided 22 minutes of non-face-to-face time during this encounter.  Rutherford Guys, MD Primary Care at [facility] [place], [place] [address] Ph.  [phone] Fax [phone]

## 2019-01-05 NOTE — Progress Notes (Signed)
Pt is following up with htn, nerve pain and and epilepsy. Pt is having swelling in the left ankle that is getting worse with the amlodipine she says. She is not experiencing any anxiety or depression. The gabapentin is not really working as well, she is using the oxycodone 5--325mg  for the pain

## 2019-01-10 ENCOUNTER — Telehealth: Payer: Self-pay | Admitting: Family Medicine

## 2019-01-10 NOTE — Telephone Encounter (Signed)
Called pt and LVM to call office back about medication. Informed pt that her medication is not on the recall list and if she has anymore questions concerning her Rx to call us back.

## 2019-01-10 NOTE — Telephone Encounter (Signed)
Copied from [place] [id number]. Topic: General - Other >> Jan 10, 2019  2:49 PM Lennox Solders wrote: Reason for CRM: Pt is calling and there is a recall on losartan and pt would like different medication. [facility] [address]

## 2019-01-11 NOTE — Telephone Encounter (Signed)
Patient returning call to [place]. Patient states that she is unable to pick up this medication from pharmacy. She is requesting a call back from CMA to discuss further. Please advise.

## 2019-01-12 NOTE — Telephone Encounter (Signed)
Pt states the  losartan (COZAAR) 100 MG tablet has been recalled and the pharmacy does not have. Pt needs a new medication prescribed.  [id number] [address], [place] [phone] (Phone) [phone] (Fax)   Would like to pick up today if possible

## 2019-01-12 NOTE — Telephone Encounter (Signed)
Is there and alternative medication she can be prescribed for her B/P.

## 2019-01-15 MED ORDER — OLMESARTAN MEDOXOMIL 40 MG PO TABS: 40.0000 mg | ORAL_TABLET | Freq: Every day | ORAL | 3 refills | Status: DC

## 2019-01-15 NOTE — Telephone Encounter (Signed)
Please let patient know that I sent in a prescription for olmesartan 40mg  once a day. thanks

## 2019-01-15 NOTE — Telephone Encounter (Signed)
Called pt and informed her that a new Rx has been sent to pharmacy.

## 2019-01-18 NOTE — Progress Notes (Deleted)
NEUROLOGY CONSULTATION NOTE  Selena Bender MRN: [id number] DOB: Jan 11, 1951  Referring provider: Grant Fontana, MD Primary care provider: Grant Fontana, MD  Reason for consult:  MS, epilepsy  HISTORY OF PRESENT ILLNESS: Selena Bender is a 68 year old black woman with multiple sclerosis, seizure disorder, hypertension and history of breast cancer who presents for MS and seizure disorder.  History supplemented by prior neurology and referring provider notes.  In the 1980s, she had ***.    Vision:  *** Motor:  *** Sensory:  *** Pain:  *** Gait:  *** Bowel/Bladder:  *** Fatigue:  *** Cognition:  *** Mood:  ***  Current disease modifying therapy:  *** Current antiepileptic medication:  Lamotrigine 100mg  daily Other current medications:  Gabapentin 600mg  twice daily, Percocet Vitamins:  D 1000 IU daily  Past disease modifying therapy:  *** Past antiepileptic medications:  *** Other past medications:  ***  MRI brain with and without contrast from 10/25/12 demonstrated "multiple perpendicularly oriented periventricular areas of T2/FLAIR hyperintense signal with dark T1 signal, compatible with chronic lesions of multiple sclerosis.  There is also characteristic involvement of the corpus callosum.  Mild encephalomalacia in the left parietal lobe is noted.  09/15/18 LABS:  CBC with WBC 5.6, HGB 11.3, HCT 33.3, PLT 204; CMP with Na 141, K 4.5, Cl 106, CO2 18, glucose 82, BUN 20, Cr 1.37, t bili 0.5, ALP 54, AST 20, ALT 12; lamotrigine level 6.3; TSH 0.583.  PAST MEDICAL HISTORY: Past Medical History:  Diagnosis Date  . Breast cancer ([place])   . Hypertension   . Multiple sclerosis ([place])   . Seizures ([place])     PAST SURGICAL HISTORY: Past Surgical History:  Procedure Laterality Date  . BREAST SURGERY      MEDICATIONS: Current Outpatient Medications on File Prior to Visit  Medication Sig Dispense Refill  . amLODipine (NORVASC) 5 MG tablet Take 1 tablet (5 mg total)  by mouth daily. 90 tablet 1  . ascorbic acid (VITAMIN C) 500 MG/5ML syrup Take by mouth daily.    . cholecalciferol (VITAMIN D) 25 MCG (1000 UT) tablet Take 1 tablet (1,000 Units total) by mouth daily. 90 tablet 3  . gabapentin (NEURONTIN) 300 MG capsule Take 2 capsules (600 mg total) by mouth 2 (two) times daily. 120 capsule 3  . lamoTRIgine (LAMICTAL) 100 MG tablet Take 1 tablet (100 mg total) by mouth daily. 90 tablet 0  . loratadine (CLARITIN) 10 MG tablet Take 1 tablet (10 mg total) by mouth daily. 30 tablet 11  . olmesartan (BENICAR) 40 MG tablet Take 1 tablet (40 mg total) by mouth daily. 90 tablet 3  . oxyCODONE-acetaminophen (PERCOCET/ROXICET) 5-325 MG tablet Take 1 tablet by mouth every 8 (eight) hours as needed for severe pain. 90 tablet 0   No current facility-administered medications on file prior to visit.     ALLERGIES: No Known Allergies  FAMILY HISTORY: Family History  Problem Relation Age of Onset  . Sickle cell anemia Father    ***.  SOCIAL HISTORY: Social History   Socioeconomic History  . Marital status: Widowed    Spouse name: Not on file  . Number of children: Not on file  . Years of education: Not on file  . Highest education level: Not on file  Occupational History  . Not on file  Social Needs  . Financial resource strain: Not on file  . Food insecurity    Worry: Not on file    Inability: Not on  file  . Transportation needs    Medical: Not on file    Non-medical: Not on file  Tobacco Use  . Smoking status: Former Smoker    Types: Cigarettes  . Smokeless tobacco: Never Used  Substance and Sexual Activity  . Alcohol use: Yes  . Drug use: Never  . Sexual activity: Not Currently  Lifestyle  . Physical activity    Days per week: Not on file    Minutes per session: Not on file  . Stress: Not on file  Relationships  . Social Herbalist on phone: Not on file    Gets together: Not on file    Attends religious service: Not on file     Active member of club or organization: Not on file    Attends meetings of clubs or organizations: Not on file    Relationship status: Not on file  . Intimate partner violence    Fear of current or ex partner: Not on file    Emotionally abused: Not on file    Physically abused: Not on file    Forced sexual activity: Not on file  Other Topics Concern  . Not on file  Social History Narrative  . Not on file    REVIEW OF SYSTEMS: Constitutional: No fevers, chills, or sweats, no generalized fatigue, change in appetite Eyes: No visual changes, double vision, eye pain Ear, nose and throat: No hearing loss, ear pain, nasal congestion, sore throat Cardiovascular: No chest pain, palpitations Respiratory:  No shortness of breath at rest or with exertion, wheezes GastrointestinaI: No nausea, vomiting, diarrhea, abdominal pain, fecal incontinence Genitourinary:  No dysuria, urinary retention or frequency Musculoskeletal:  No neck pain, back pain Integumentary: No rash, pruritus, skin lesions Neurological: as above Psychiatric: No depression, insomnia, anxiety Endocrine: No palpitations, fatigue, diaphoresis, mood swings, change in appetite, change in weight, increased thirst Hematologic/Lymphatic:  No purpura, petechiae. Allergic/Immunologic: no itchy/runny eyes, nasal congestion, recent allergic reactions, rashes  PHYSICAL EXAM: *** General: No acute distress.  Patient appears ***-groomed.  *** Head:  Normocephalic/atraumatic Eyes:  fundi examined but not visualized Neck: supple, no paraspinal tenderness, full range of motion Back: No paraspinal tenderness Heart: regular rate and rhythm Lungs: Clear to auscultation bilaterally. Vascular: No carotid bruits. Neurological Exam: Mental status: alert and oriented to person, place, and time, recent and remote memory intact, fund of knowledge intact, attention and concentration intact, speech fluent and not dysarthric, language intact. Cranial  nerves: CN I: not tested CN II: pupils equal, round and reactive to light, visual fields intact CN III, IV, VI:  full range of motion, no nystagmus, no ptosis CN V: facial sensation intact CN VII: upper and lower face symmetric CN VIII: hearing intact CN IX, X: gag intact, uvula midline CN XI: sternocleidomastoid and trapezius muscles intact CN XII: tongue midline Bulk & Tone: normal, no fasciculations. Motor:  5/5 throughout *** Sensation:  [place] *** temperature *** and vibration sensation intact.  ***. Deep Tendon Reflexes:  2+ throughout, *** toes downgoing.  *** Finger to nose testing:  Without dysmetria.  *** Heel to shin:  Without dysmetria.  *** Gait:  Normal station and stride.  Able to turn and tandem walk. [place] ***.  IMPRESSION: ***  PLAN: ***  Thank you for allowing me to take part in the care of this patient.  Metta Clines, DO  CC: ***

## 2019-01-19 ENCOUNTER — Ambulatory Visit: Payer: Self-pay | Admitting: Neurology

## 2019-01-25 ENCOUNTER — Ambulatory Visit: Payer: Self-pay

## 2019-01-25 NOTE — Telephone Encounter (Signed)
Appointment has been scheduled.

## 2019-01-25 NOTE — Telephone Encounter (Signed)
Returned call to patient who is reporting that she developed a stye on her left lower eye lid. She states that she first noticed it on Monday. Today it is very red. Just in the area of the stye and puss is draining form the area. She rates the pain at 7. She has been treating with warm compresses. She has no fever. She has had problems with styes in the past and since the quarantine started this is the second stye. Care advice read to patient. Pt verbalized understanding of instructions. Call placed to office for appointment scheduling.   Reason for Disposition . Longstanding or recurring problems with styes  Answer Assessment - Initial Assessment Questions 1. LOCATION: "Which eye has the sty?" "Upper or lower eyelid?"     Left eye lower eyelid 2. SIZE: "How big is it?" (Note: standard pencil eraser is 6 mm)     smaller 3. EYELID: "Is the eyelid swollen?" If so, ask: "How much?"     Just the painful spot 4. REDNESS: "Has the redness spread onto the eyelid?"    no 5. ONSET: "When did you notice the sty?"     monday 6. VISION: "Do you have blurred vision?"      no 7. PAIN: "Is it painful?" If so, ask: "How bad is the pain?"  (Scale 1-10; or mild, moderate, severe)     7 8. CONTACTS: "Do you wear contacts?"    no 9. OTHER SYMPTOMS: "Do you have any other symptoms?" (e.g., fever)     no 10. PREGNANCY: "Is there any chance you are pregnant?" "When was your last menstrual period?"       N/A  Protocols used: STY-A-AH

## 2019-01-26 ENCOUNTER — Ambulatory Visit: Payer: Medicare Other | Admitting: Registered Nurse

## 2019-02-23 ENCOUNTER — Ambulatory Visit: Payer: Self-pay | Admitting: Neurology

## 2019-02-24 ENCOUNTER — Other Ambulatory Visit: Payer: Self-pay | Admitting: Family Medicine

## 2019-02-24 NOTE — Telephone Encounter (Signed)
Requested Prescriptions  Pending Prescriptions Disp Refills  . amLODipine (NORVASC) 5 MG tablet [Pharmacy Med Name: AMLODIPINE BESYLATE 5 MG TAB] 90 tablet 0    Sig: TAKE 1 TABLET BY MOUTH EVERY DAY     Cardiovascular:  Calcium Channel Blockers Failed - 02/24/2019  9:06 AM      Failed - Last BP in normal range    BP Readings from Last 1 Encounters:  09/15/18 (!) 152/75         Passed - Valid encounter within last 6 months    Recent Outpatient Visits          5 months ago Essential hypertension   [facility], Lilia Argue, MD

## 2019-03-22 NOTE — Progress Notes (Signed)
NEUROLOGY CONSULTATION NOTE  Selena Bender MRN: [id number] DOB: [birth year]  Referring provider: Grant Fontana, MD Primary care provider: Grant Fontana, MD  Reason for consult:  Seizure disorder, multiple sclerosis  HISTORY OF PRESENT ILLNESS: Selena Bender is a 68 year old right-handed black female with hypertension and history of breast cancer presents to establish care regarding multiple sclerosis and seizure disorder.  History supplemented by prior neurologist and referring provider notes.  She had an initial flare up when she was in her 72s.  She had an episode of numbness from the waist down and difficulty with fine-finger movement, lasting 3-4 months.  After several years, she then started dragging her left foot.  She was formally diagnosed with multiple sclerosis in 2005 after MRI and CSF analysis.  She was started on Copaxone but stopped about 2 years ago.  She has not had any relapses but has had a gradual decline over the last several years, primary affecting her balance and gait.    Current disease modifying therapy:  None Past disease modifying therapy:  Copaxone (stopped in 2017- 2018 because she no longer had a neurologist)  Current medications:  Gabapentin 300mg  twice daily (for nerve pain, supposed to take 600mg  twice daily), lamotrigine 100mg  daily (for seizure prophylaxis), Percocet (for nerve pain), vitamin D 5000 IU daily  Vision:  Some blurred vision.  She states she needs new eyeglass prescription Motor:  Left foot weakness.  Sometimes her left hand cramps and closes, toes on left foot cramp.  Sensory:  She has neuropathy involving the right leg.   Pain:  Burning pins and needles sensation along the lateral right leg from foot to hip.  No back pain.   Gait:  Unsteady.  Tendency to lean backwards.  Left foot drags. Bowel/Bladder:  No issues. Fatigue:  Yes but manageable. Cognition:  Once in awhile she may have word-finding difficulty. Mood:  Some  depression from time to time.  Not chronic.    She also has a seizure disorder, possibly secondary to MS.  She has had a total of 3 seizures.  Last seizure was in the mid-90s. Current prophylaxis:  Lamotrigine 100mg  daily Past anti-epileptic medications:  Dilantin  09/15/18 LABS:  CMP with Na 141, K 4.5, Cl 106, CO2 18, glucose 82, BUN 20, Cr 1.37, GFR 46, t bili 0.5, ALP 54, AST 20, ALT 12; CBC with WBC 5.6, HGB 11.3, HCT 33.3, PLT 204; TSH 0.583; lamotrigine level 6.3.   PAST MEDICAL HISTORY: Past Medical History:  Diagnosis Date  . Breast cancer ([place])   . Hypertension   . Multiple sclerosis ([place])   . Seizures ([place])     PAST SURGICAL HISTORY: Past Surgical History:  Procedure Laterality Date  . BREAST SURGERY      MEDICATIONS: Current Outpatient Medications on File Prior to Visit  Medication Sig Dispense Refill  . amLODipine (NORVASC) 5 MG tablet TAKE 1 TABLET BY MOUTH EVERY DAY 90 tablet 0  . ascorbic acid (VITAMIN C) 500 MG/5ML syrup Take by mouth daily.    . cholecalciferol (VITAMIN D) 25 MCG (1000 UT) tablet Take 1 tablet (1,000 Units total) by mouth daily. 90 tablet 3  . gabapentin (NEURONTIN) 300 MG capsule Take 2 capsules (600 mg total) by mouth 2 (two) times daily. 120 capsule 3  . lamoTRIgine (LAMICTAL) 100 MG tablet Take 1 tablet (100 mg total) by mouth daily. 90 tablet 0  . loratadine (CLARITIN) 10 MG tablet Take 1 tablet (10 mg total) by  mouth daily. 30 tablet 11  . olmesartan (BENICAR) 40 MG tablet Take 1 tablet (40 mg total) by mouth daily. 90 tablet 3  . oxyCODONE-acetaminophen (PERCOCET/ROXICET) 5-325 MG tablet Take 1 tablet by mouth every 8 (eight) hours as needed for severe pain. 90 tablet 0   No current facility-administered medications on file prior to visit.     ALLERGIES: No Known Allergies  FAMILY HISTORY: Family History  Problem Relation Age of Onset  . Sickle cell anemia Father     SOCIAL HISTORY: Social History   Socioeconomic History  .  Marital status: Widowed    Spouse name: Not on file  . Number of children: Not on file  . Years of education: Not on file  . Highest education level: Not on file  Occupational History  . Not on file  Social Needs  . Financial resource strain: Not on file  . Food insecurity    Worry: Not on file    Inability: Not on file  . Transportation needs    Medical: Not on file    Non-medical: Not on file  Tobacco Use  . Smoking status: Former Smoker    Types: Cigarettes  . Smokeless tobacco: Never Used  Substance and Sexual Activity  . Alcohol use: Yes  . Drug use: Never  . Sexual activity: Not Currently  Lifestyle  . Physical activity    Days per week: Not on file    Minutes per session: Not on file  . Stress: Not on file  Relationships  . Social Herbalist on phone: Not on file    Gets together: Not on file    Attends religious service: Not on file    Active member of club or organization: Not on file    Attends meetings of clubs or organizations: Not on file    Relationship status: Not on file  . Intimate partner violence    Fear of current or ex partner: Not on file    Emotionally abused: Not on file    Physically abused: Not on file    Forced sexual activity: Not on file  Other Topics Concern  . Not on file  Social History Narrative  . Not on file    REVIEW OF SYSTEMS: Constitutional: No fevers, chills, or sweats, no generalized fatigue, change in appetite Eyes: No visual changes, double vision, eye pain Ear, nose and throat: No hearing loss, ear pain, nasal congestion, sore throat Cardiovascular: No chest pain, palpitations Respiratory:  No shortness of breath at rest or with exertion, wheezes GastrointestinaI: No nausea, vomiting, diarrhea, abdominal pain, fecal incontinence Genitourinary:  No dysuria, urinary retention or frequency Musculoskeletal:  No neck pain, back pain Integumentary: No rash, pruritus, skin lesions Neurological: as above  Psychiatric: No depression, insomnia, anxiety Endocrine: No palpitations, fatigue, diaphoresis, mood swings, change in appetite, change in weight, increased thirst Hematologic/Lymphatic:  No purpura, petechiae. Allergic/Immunologic: no itchy/runny eyes, nasal congestion, recent allergic reactions, rashes  PHYSICAL EXAM: Blood pressure 113/71, pulse 70, temperature 99 F (37.2 C), height 5\' 6"  (1.676 m), weight 134 lb (60.8 kg), SpO2 98 %. General: No acute distress.  Patient appears well-groomed.  Head:  Normocephalic/atraumatic Eyes:  fundi examined but not visualized Neck: supple, no paraspinal tenderness, full range of motion Back: No paraspinal tenderness Heart: regular rate and rhythm Lungs: Clear to auscultation bilaterally. Vascular: No carotid bruits. Neurological Exam: Mental status: alert and oriented to person, place, and time, recent and remote memory intact, fund of  knowledge intact, attention and concentration intact, speech fluent and not dysarthric, language intact. Cranial nerves: CN I: not tested CN II: pupils equal, round and reactive to light, visual fields intact CN III, IV, VI:  Left eye abducted on primary gaze, mildly reduced adduction of right eye;  Otherwise, full range of motion, no nystagmus, no ptosis CN V: facial sensation intact CN VII: upper and lower face symmetric CN VIII: hearing intact CN IX, X: gag intact, uvula midline CN XI: sternocleidomastoid and trapezius muscles intact CN XII: tongue midline Bulk & Tone: normal, no fasciculations. Motor:  4+/5 left upper extremity, 2+/5 left hip flexion, left foot drop/externally rotated.  Otherwise 5/5. Sensation:  Pinprick sensation reduced in left upper and lower extremities and vibration sensation reduced in lower extremities (right worse than left). Deep Tendon Reflexes:  2+ throughout, left Babinski, toes downgoing on right Finger to nose testing:  Past pointing Heel to shin:  Unable to assess Gait:   Left hemiplegic gait.  Left foot externally rotated.  IMPRESSION: 1.  Multiple sclerosis 2.  Localization-related epilepsy, stable  PLAN: 1.  Defers DMT 2.  Continue D3 5000 IU daily.  Check level. 3.  Increase gabapentin to 300mg  three times daily 4.  Check new baseline:  MRI of brain and cervical spine with and without contrast.  Will provide Valium for claustrophobia (patient does not drive) 5.  Follow up in 6 months.  Thank you for allowing me to take part in the care of this patient.  45 minutes spent face to face with patient, over 50% spent discussing management.  Metta Clines, DO  CC:  Grant Fontana, MD

## 2019-03-23 ENCOUNTER — Other Ambulatory Visit: Payer: Self-pay

## 2019-03-23 ENCOUNTER — Other Ambulatory Visit (INDEPENDENT_AMBULATORY_CARE_PROVIDER_SITE_OTHER): Payer: Medicare Other

## 2019-03-23 ENCOUNTER — Ambulatory Visit (INDEPENDENT_AMBULATORY_CARE_PROVIDER_SITE_OTHER): Payer: Medicare Other | Admitting: Neurology

## 2019-03-23 ENCOUNTER — Encounter: Payer: Self-pay | Admitting: Neurology

## 2019-03-23 VITALS — BP 113/71 | HR 70 | Temp 99.0°F | Ht 66.0 in | Wt 134.0 lb

## 2019-03-23 DIAGNOSIS — G40109 Localization-related (focal) (partial) symptomatic epilepsy and epileptic syndromes with simple partial seizures, not intractable, without status epilepticus: Secondary | ICD-10-CM

## 2019-03-23 DIAGNOSIS — R6889 Other general symptoms and signs: Secondary | ICD-10-CM | POA: Diagnosis not present

## 2019-03-23 DIAGNOSIS — G35 Multiple sclerosis: Secondary | ICD-10-CM

## 2019-03-23 DIAGNOSIS — E559 Vitamin D deficiency, unspecified: Secondary | ICD-10-CM | POA: Diagnosis not present

## 2019-03-23 LAB — VITAMIN D 25 HYDROXY (VIT D DEFICIENCY, FRACTURES): VITD: 111.82 ng/mL (ref 30.00–100.00)

## 2019-03-23 MED ORDER — DIAZEPAM 5 MG PO TABS: ORAL_TABLET | ORAL | 0 refills | Status: DC

## 2019-03-23 MED ORDER — GABAPENTIN 300 MG PO CAPS: 300.0000 mg | ORAL_CAPSULE | Freq: Three times a day (TID) | ORAL | 5 refills | Status: DC

## 2019-03-23 MED ORDER — LAMOTRIGINE 100 MG PO TABS: 100.0000 mg | ORAL_TABLET | Freq: Every day | ORAL | 1 refills | Status: DC

## 2019-03-23 NOTE — Patient Instructions (Addendum)
1. Refilled lamotrigine 100mg  daily 2.  Prescribed gabapentin 300mg  three times daily 3.  MRI of brain and cervical spine with and without contrast.  Take Valium 5mg  30 minutes prior to MRI and may take a tablet at the MRI if needed. 4.  Check vitamin D level 5.  Follow up in 6 months.  Your provider has requested that you have labwork completed today. Please go to [facility] ([id number]) on the second floor of this building before leaving the office today. You do not need to check in. If you are not called within 15 minutes please check with the front desk.    We have sent a referral to [facility] for your MRI's and they will call you directly to schedule your appointment. They are located at [address]. If you need to contact them directly, or you have not received a call within 5-7 days, please call [phone].

## 2019-03-26 ENCOUNTER — Telehealth: Payer: Self-pay

## 2019-03-26 NOTE — Telephone Encounter (Signed)
Patient is calling you back. Thanks!

## 2019-03-26 NOTE — Telephone Encounter (Signed)
-----   Message from Pieter Partridge, DO sent at 03/23/2019  4:30 PM EDT ----- Mildly elevated.  Reduce dose of D3 from 5000 to 4000 IU daily

## 2019-03-26 NOTE — Telephone Encounter (Signed)
Called Pt to advise of lab results, mailbox was full, unable to leave message.

## 2019-03-27 NOTE — Telephone Encounter (Signed)
Called and spoke with Pt, advised her to reduce D3 to 4000 IU daily. Pt verbalized understanding.

## 2019-04-04 DIAGNOSIS — C50411 Malignant neoplasm of upper-outer quadrant of right female breast: Secondary | ICD-10-CM | POA: Diagnosis not present

## 2019-04-04 DIAGNOSIS — M8580 Other specified disorders of bone density and structure, unspecified site: Secondary | ICD-10-CM | POA: Diagnosis not present

## 2019-04-04 DIAGNOSIS — Z170 Estrogen receptor positive status [ER+]: Secondary | ICD-10-CM | POA: Diagnosis not present

## 2019-04-06 DIAGNOSIS — Z1231 Encounter for screening mammogram for malignant neoplasm of breast: Secondary | ICD-10-CM | POA: Diagnosis not present

## 2019-04-06 LAB — HM MAMMOGRAPHY

## 2019-05-11 ENCOUNTER — Other Ambulatory Visit: Payer: Self-pay

## 2019-05-11 ENCOUNTER — Ambulatory Visit
Admission: RE | Admit: 2019-05-11 | Discharge: 2019-05-11 | Disposition: A | Discharge status: Home / Self Care | Payer: Medicare Other | Source: Ambulatory Visit | Attending: Neurology | Admitting: Neurology

## 2019-05-11 DIAGNOSIS — G35 Multiple sclerosis: Secondary | ICD-10-CM

## 2019-05-11 DIAGNOSIS — M4602 Spinal enthesopathy, cervical region: Secondary | ICD-10-CM | POA: Diagnosis not present

## 2019-05-11 DIAGNOSIS — R262 Difficulty in walking, not elsewhere classified: Secondary | ICD-10-CM | POA: Diagnosis not present

## 2019-05-11 DIAGNOSIS — M47812 Spondylosis without myelopathy or radiculopathy, cervical region: Secondary | ICD-10-CM | POA: Diagnosis not present

## 2019-05-11 DIAGNOSIS — M4802 Spinal stenosis, cervical region: Secondary | ICD-10-CM | POA: Diagnosis not present

## 2019-05-11 MED ORDER — GADOBENATE DIMEGLUMINE 529 MG/ML IV SOLN
7.0000 mL | Freq: Once | INTRAVENOUS | Status: AC | PRN
  Administered 2019-05-11: 7 mL via INTRAVENOUS

## 2019-05-15 ENCOUNTER — Telehealth: Payer: Self-pay | Admitting: *Deleted

## 2019-05-15 NOTE — Telephone Encounter (Addendum)
  Called and spoke to patient and relayed below info from MD - verbalizes understanding.  ----- Message from Pieter Partridge, DO sent at 05/14/2019 12:50 PM EDT -----     MRI of brain shows evidence of MS in brain and spinal cord.  No active plaques seen.

## 2019-05-20 ENCOUNTER — Other Ambulatory Visit: Payer: Self-pay | Admitting: Family Medicine

## 2019-05-25 ENCOUNTER — Telehealth: Payer: Self-pay | Admitting: Family Medicine

## 2019-05-25 NOTE — Telephone Encounter (Signed)
Pt called and is asking for Dr.Santiago to call something in different for allergies pt is taking loratadine (CLARITIN) 10 MG tablet   it is not working also needs something for pain .    Please advise

## 2019-05-29 NOTE — Telephone Encounter (Signed)
Patient needs office visit, please schedule. thanks

## 2019-05-29 NOTE — Telephone Encounter (Signed)
Spoke with pt and scheduled appt. Pt needs to verify transportation and may reschedule current appt

## 2019-05-31 ENCOUNTER — Encounter: Payer: Self-pay | Admitting: Family Medicine

## 2019-05-31 ENCOUNTER — Ambulatory Visit (INDEPENDENT_AMBULATORY_CARE_PROVIDER_SITE_OTHER): Payer: Medicare Other | Admitting: Family Medicine

## 2019-05-31 ENCOUNTER — Other Ambulatory Visit: Payer: Self-pay

## 2019-05-31 VITALS — BP 137/81 | HR 94 | Temp 98.0°F | Resp 16 | Wt 146.0 lb

## 2019-05-31 DIAGNOSIS — Z1159 Encounter for screening for other viral diseases: Secondary | ICD-10-CM | POA: Diagnosis not present

## 2019-05-31 DIAGNOSIS — G40109 Localization-related (focal) (partial) symptomatic epilepsy and epileptic syndromes with simple partial seizures, not intractable, without status epilepticus: Secondary | ICD-10-CM

## 2019-05-31 DIAGNOSIS — N1831 Chronic kidney disease, stage 3a: Secondary | ICD-10-CM | POA: Diagnosis not present

## 2019-05-31 DIAGNOSIS — Z23 Encounter for immunization: Secondary | ICD-10-CM

## 2019-05-31 DIAGNOSIS — J302 Other seasonal allergic rhinitis: Secondary | ICD-10-CM

## 2019-05-31 DIAGNOSIS — G35 Multiple sclerosis: Secondary | ICD-10-CM | POA: Diagnosis not present

## 2019-05-31 DIAGNOSIS — R8290 Unspecified abnormal findings in urine: Secondary | ICD-10-CM

## 2019-05-31 DIAGNOSIS — I10 Essential (primary) hypertension: Secondary | ICD-10-CM

## 2019-05-31 DIAGNOSIS — Z853 Personal history of malignant neoplasm of breast: Secondary | ICD-10-CM

## 2019-05-31 LAB — COMPREHENSIVE METABOLIC PANEL
ALT: 12 IU/L (ref 0–32)
AST: 16 IU/L (ref 0–40)
Albumin/Globulin Ratio: 1.2 (ref 1.2–2.2)
Albumin: 4.2 g/dL (ref 3.8–4.8)
Alkaline Phosphatase: 67 IU/L (ref 39–117)
BUN/Creatinine Ratio: 13 (ref 12–28)
BUN: 18 mg/dL (ref 8–27)
Bilirubin Total: 0.3 mg/dL (ref 0.0–1.2)
CO2: 21 mmol/L (ref 20–29)
Calcium: 9.4 mg/dL (ref 8.7–10.3)
Chloride: 106 mmol/L (ref 96–106)
Creatinine, Ser: 1.36 mg/dL — ABNORMAL HIGH (ref 0.57–1.00)
GFR calc Af Amer: 46 mL/min/{1.73_m2} — ABNORMAL LOW (ref >59)
GFR calc non Af Amer: 40 mL/min/{1.73_m2} — ABNORMAL LOW (ref >59)
Globulin, Total: 3.4 g/dL (ref 1.5–4.5)
Glucose: 94 mg/dL (ref 65–99)
Potassium: 4.3 mmol/L (ref 3.5–5.2)
Sodium: 142 mmol/L (ref 134–144)
Total Protein: 7.6 g/dL (ref 6.0–8.5)

## 2019-05-31 MED ORDER — LORATADINE 10 MG PO TABS: 10.0000 mg | ORAL_TABLET | Freq: Every day | ORAL | 11 refills | Status: DC

## 2019-05-31 MED ORDER — AMLODIPINE BESYLATE 5 MG PO TABS: 5.0000 mg | ORAL_TABLET | Freq: Every day | ORAL | 1 refills | Status: DC

## 2019-05-31 MED ORDER — LOSARTAN POTASSIUM 50 MG PO TABS: 50.0000 mg | ORAL_TABLET | Freq: Every day | ORAL | 1 refills | Status: DC

## 2019-05-31 NOTE — Progress Notes (Signed)
10/22/202010:07 AM  [place] 01-Aug-1951, 68 y.o., female [id number]  Chief Complaint  Patient presents with  . Hypertension    6 month follow-up   . Medication Management    need refills     HPI:   Patient is a 68 y.o. female with past medical history significant for HTN, CKD3, right breast cancer in 2014, seizure disorder and MS who presents today for routine followup  Last OV May 2020 - telemedicine Saw neuro, Dr Tomi Likens, in aug 2020 - increased her gabapentin, checked D3 - reduced her to 4000 units daily  Has never been tested for hep c  Has never had a blood transfusion  Continues to see Dr Chauncey Reading, onc He rx pain medication she takes prn Mammogram in aug 2020 at [facility] Colonoscopy 2 years ago, Dr Adonis Huguenin Scheduled for dexa in dec - managed by onc [facility] hospital were all of these happended  She still has not moved to [place] a cane  Depression screen [facility] 2/9 05/31/2019 09/15/2018  Decreased Interest 1 0  Down, Depressed, Hopeless 1 0  PHQ - 2 Score 2 0  Altered sleeping 0 -  Tired, decreased energy 0 -  Change in appetite 0 -  Feeling bad or failure about yourself  1 -  Trouble concentrating 0 -  Moving slowly or fidgety/restless 0 -  Suicidal thoughts 0 -  PHQ-9 Score 3 -    Fall Risk  05/31/2019 03/23/2019 09/15/2018 09/15/2018  Falls in the past year? 1 1 0 0  Number falls in past yr: 0 0 - 0  Injury with Fall? 0 0 - 0  Risk for fall due to : - - - Impaired vision;Impaired balance/gait     No Known Allergies  Prior to Admission medications   Medication Sig Start Date End Date Taking? Authorizing Provider  amLODipine (NORVASC) 5 MG tablet TAKE 1 TABLET BY MOUTH EVERY DAY 05/21/19  Yes Rutherford Guys, MD  ascorbic acid (VITAMIN C) 500 MG/5ML syrup Take by mouth daily.   Yes [provider]  cholecalciferol (VITAMIN D) 25 MCG (1000 UT) tablet Take 1 tablet (1,000 Units total) by mouth daily. 09/15/18  Yes  Rutherford Guys, MD  diazepam (VALIUM) 5 MG tablet Take 1 tablet 30 minutes prior to exam. May repeat at time of exam if needed. 03/23/19  Yes Jaffe, Adam R, DO  gabapentin (NEURONTIN) 300 MG capsule Take 1 capsule (300 mg total) by mouth 3 (three) times daily. 03/23/19  Yes Tomi Likens, Adam R, DO  lamoTRIgine (LAMICTAL) 100 MG tablet Take 1 tablet (100 mg total) by mouth daily. 03/23/19  Yes Jaffe, Adam R, DO  loratadine (CLARITIN) 10 MG tablet Take 1 tablet (10 mg total) by mouth daily. 01/05/19  Yes Rutherford Guys, MD  losartan (COZAAR) 50 MG tablet Take 50 mg by mouth daily.   Yes [provider]  oxyCODONE-acetaminophen (PERCOCET/ROXICET) 5-325 MG tablet Take 1 tablet by mouth every 8 (eight) hours as needed for severe pain. 01/05/19  Yes Rutherford Guys, MD    Past Medical History:  Diagnosis Date  . Breast cancer ([place])   . Hypertension   . Multiple sclerosis ([place])   . Seizures ([place])     Past Surgical History:  Procedure Laterality Date  . BREAST SURGERY      Social History   Tobacco Use  . Smoking status: Former Smoker    Types: Cigarettes  . Smokeless tobacco: Never Used  Substance Use Topics  . Alcohol use: Yes    Family History  Problem Relation Age of Onset  . Sickle cell anemia Father     Review of Systems  Constitutional: Negative for chills and fever.  Respiratory: Negative for cough and shortness of breath.   Cardiovascular: Negative for chest pain, palpitations and leg swelling.  Gastrointestinal: Negative for abdominal pain, nausea and vomiting.     OBJECTIVE:  Today's Vitals   05/31/19 0958 05/31/19 1051  BP: (!) 153/86 137/81  Pulse: 94   Resp: 16   Temp: 98 F (36.7 C)   TempSrc: Oral   SpO2: 100%   Weight: 146 lb (66.2 kg)    Body mass index is 23.57 kg/m.  BP Readings from Last 3 Encounters:  05/31/19 137/81  03/23/19 113/71  09/15/18 (!) 152/75    Physical Exam Vitals signs and nursing note reviewed.  Constitutional:       Appearance: She is well-developed.  HENT:     Head: Normocephalic and atraumatic.     Mouth/Throat:     Pharynx: No oropharyngeal exudate.  Eyes:     General: No scleral icterus.    Conjunctiva/sclera: Conjunctivae normal.     Pupils: Pupils are equal, round, and reactive to light.  Neck:     Musculoskeletal: Neck supple.  Cardiovascular:     Rate and Rhythm: Normal rate and regular rhythm.     Heart sounds: Normal heart sounds. No murmur. No friction rub. No gallop.   Pulmonary:     Effort: Pulmonary effort is normal.     Breath sounds: Normal breath sounds. No wheezing, rhonchi or rales.  Abdominal:     General: Bowel sounds are normal. There is no distension.     Palpations: There is no mass.     Tenderness: There is no abdominal tenderness.  Musculoskeletal:     Right lower leg: No edema.     Left lower leg: No edema.  Skin:    General: Skin is warm and dry.  Neurological:     Mental Status: She is alert and oriented to person, place, and time.     Gait: Gait abnormal.  Psychiatric:        Mood and Affect: Mood normal.        Behavior: Behavior normal.     No results found for this or any previous visit (from the past 24 hour(s)).  No results found.   ASSESSMENT and PLAN  1. Essential hypertension Controlled. Continue current regime.  - Comprehensive metabolic panel - Care order/instruction: - Urinalysis, Routine w reflex microscopic  2. Stage 3a chronic kidney disease Stable. Discussed avoidance of nephrotoxins. Unclear etiology, hoping medical records will elucidate cause. Cont to monitor.   3. Encounter for hepatitis C screening test for low risk patient - Hepatitis C antibody  4. Multiple sclerosis ([place]) 5. Localization-related epilepsy [facility]) Managed by neurology  6. History of right breast cancer Managed by onc  7. Seasonal allergies - loratadine (CLARITIN) 10 MG tablet; Take 1 tablet (10 mg total) by mouth daily.  Other orders - Pneumococcal  polysaccharide vaccine 23-valent greater than or equal to 2yo subcutaneous/IM - amLODipine (NORVASC) 5 MG tablet; Take 1 tablet (5 mg total) by mouth daily. - losartan (COZAAR) 50 MG tablet; Take 1 tablet (50 mg total) by mouth daily.  Return in about 6 months (around 11/29/2019).    Rutherford Guys, MD Primary Care at [facility], [place] [address] Ph.  [phone]  Fax [phone]

## 2019-05-31 NOTE — Patient Instructions (Signed)
° ° ° °  If you have lab work done today you will be contacted with your lab results within the next 2 weeks.  If you have not heard from us then please contact us. The fastest way to get your results is to register for My Chart. ° ° °IF you received an x-ray today, you will receive an invoice from [facility]. Please contact [facility] at [phone] with questions or concerns regarding your invoice.  ° °IF you received labwork today, you will receive an invoice from [facility]. Please contact [facility] at [phone] with questions or concerns regarding your invoice.  ° °Our billing staff will not be able to assist you with questions regarding bills from these companies. ° °You will be contacted with the lab results as soon as they are available. The fastest way to get your results is to activate your My Chart account. Instructions are located on the last page of this paperwork. If you have not heard from us regarding the results in 2 weeks, please contact this office. °  ° ° ° °

## 2019-06-01 ENCOUNTER — Telehealth: Payer: Self-pay | Admitting: Family Medicine

## 2019-06-01 LAB — URINALYSIS, ROUTINE W REFLEX MICROSCOPIC
Bilirubin, UA: NEGATIVE
Glucose, UA: NEGATIVE
Ketones, UA: NEGATIVE
Nitrite, UA: POSITIVE — AB
RBC, UA: NEGATIVE
Specific Gravity, UA: 1.019 (ref 1.005–1.030)
Urobilinogen, Ur: 0.2 mg/dL (ref 0.2–1.0)
pH, UA: 5.5 (ref 5.0–7.5)

## 2019-06-01 LAB — MICROSCOPIC EXAMINATION
Epithelial Cells (non renal): >10 /hpf — AB (ref 0–10)
RBC, Urine: NONE SEEN /hpf (ref 0–2)
WBC, UA: >30 /hpf — AB (ref 0–5)

## 2019-06-01 LAB — HEPATITIS C ANTIBODY: Hep C Virus Ab: <0.1 s/co ratio (ref 0.0–0.9)

## 2019-06-01 NOTE — Telephone Encounter (Signed)
Pt stated her medications that were sent to the pharmacy on yesterday were incorrect and she was missing some things as well. Please advise.

## 2019-06-01 NOTE — Telephone Encounter (Signed)
Patient would like something different for allergies instead of the Loratadine and she was suppose to have a pain medication sent to pharmacy. Please advise

## 2019-06-01 NOTE — Telephone Encounter (Signed)
Pt would like to speak to Dr Pamella Pert or her nurse.  Please call pt: [phone]

## 2019-06-01 NOTE — Addendum Note (Signed)
Addended by: Rutherford Guys on: 06/01/2019 08:16 AM   Modules accepted: Orders

## 2019-06-04 NOTE — Telephone Encounter (Signed)
Pt is checking on status of this call.

## 2019-06-05 NOTE — Telephone Encounter (Signed)
Patient calling to check the status of these prescriptions. States that she has not heard anything back. Please advise

## 2019-06-06 ENCOUNTER — Other Ambulatory Visit: Payer: Self-pay

## 2019-06-06 DIAGNOSIS — G35 Multiple sclerosis: Secondary | ICD-10-CM

## 2019-06-06 NOTE — Telephone Encounter (Signed)
Pt states a medication change was discussed at recent OV and it was not supposed to Loratadine that was called in as it is not effective.  Requesting change. Also states she was supposed to receive a refill of her Percocet, which she takes periodically for MS, rib pain and chronic pain. LRF was May 2020.  Refill pended.  Pharmacy confirmed.

## 2019-06-07 ENCOUNTER — Telehealth: Payer: Self-pay

## 2019-06-07 MED ORDER — HYDROCODONE-ACETAMINOPHEN 5-325 MG PO TABS: 1.0000 | ORAL_TABLET | Freq: Four times a day (QID) | ORAL | 0 refills | Status: DC | PRN

## 2019-06-07 MED ORDER — CETIRIZINE HCL 10 MG PO TABS: 10.0000 mg | ORAL_TABLET | Freq: Every day | ORAL | 11 refills | Status: DC

## 2019-06-07 NOTE — Telephone Encounter (Signed)
Lm on home phone letting pt know that meds were sent to her pharmacy.

## 2019-06-07 NOTE — Telephone Encounter (Signed)
PMP reviewed Please let patient know that I sent vicodin, which is what her previous provider was prescribing.  Also I normally do not send in prescriptions for oral allergy meds as usually they are not covered as they are all over the counter. I did however just send one, generic zyrtec. If not covered, its over the counter.  thanks

## 2019-06-07 NOTE — Telephone Encounter (Signed)
Pt called in, I informed her that she had prescriptions sent to the pharmacy

## 2019-06-14 ENCOUNTER — Other Ambulatory Visit: Payer: Self-pay | Admitting: Family Medicine

## 2019-06-14 ENCOUNTER — Encounter: Payer: Self-pay | Admitting: *Deleted

## 2019-06-14 NOTE — Telephone Encounter (Signed)
Forwarding medication refill request to provider for review. 

## 2019-07-19 DIAGNOSIS — Z780 Asymptomatic menopausal state: Secondary | ICD-10-CM | POA: Diagnosis not present

## 2019-07-19 DIAGNOSIS — C50411 Malignant neoplasm of upper-outer quadrant of right female breast: Secondary | ICD-10-CM | POA: Diagnosis not present

## 2019-07-19 DIAGNOSIS — M8588 Other specified disorders of bone density and structure, other site: Secondary | ICD-10-CM | POA: Diagnosis not present

## 2019-07-19 DIAGNOSIS — Z13820 Encounter for screening for osteoporosis: Secondary | ICD-10-CM | POA: Diagnosis not present

## 2019-07-19 DIAGNOSIS — M8580 Other specified disorders of bone density and structure, unspecified site: Secondary | ICD-10-CM | POA: Diagnosis not present

## 2019-07-19 DIAGNOSIS — Z79811 Long term (current) use of aromatase inhibitors: Secondary | ICD-10-CM | POA: Diagnosis not present

## 2019-07-19 DIAGNOSIS — Z170 Estrogen receptor positive status [ER+]: Secondary | ICD-10-CM | POA: Diagnosis not present

## 2019-07-19 DIAGNOSIS — G893 Neoplasm related pain (acute) (chronic): Secondary | ICD-10-CM | POA: Diagnosis not present

## 2019-08-13 ENCOUNTER — Other Ambulatory Visit: Payer: Self-pay | Admitting: Family Medicine

## 2019-09-24 NOTE — Progress Notes (Signed)
NEUROLOGY FOLLOW UP OFFICE NOTE  Selena Bender [id number]  HISTORY OF PRESENT ILLNESS: Selena Bender is a 69 year old right-handed black female with hypertension and history of breast cancer who follows up for multiple sclerosis and seizure disorder.    UPDATE: Current disease modifying therapy:  None Current seizure prophylaxis:  Lamotrigine 100mg  daily Current medications:  Gabapentin 300mg  twice daily (for nerve pain, supposed to take 600mg  twice daily), lamotrigine 100mg  daily (for seizure prophylaxis), Percocet (for nerve pain), vitamin D 4000 IU daily  03/23/2019:  Vitamin D 25 hydroxy was 111.82.  Advised to reduce D3 from 5000 IU to 4000 IU daily.  MRIs personally reviewed: 05/11/2019 MRI BRAIN W WO:  Advanced chronic demyelinating disease involving the cerebral white matter and cortex as as well as suspected superimposed chronic small vessel ischemia particularly involving the right caudate and cerebellum. 05/11/2019 MRI CERVICAL W WO:  Chronic multifocal patchy demyelination involving the spinal cord and cervicomedullary junction, with severe involvement of left hemicord at C4 level.  Degenerative cervical spine disease with moderate spinal stenosis causing mass effect on left hemicord at C4-5 with moderate to severe bilateral foraminal stenosis; lesser spinal stenosis at C6-7; moderate to severe bilateral foraminal stenosis at C6-7 and C7-T1.  Vision:  Some blurred vision.  She states she needs new eyeglass prescription Motor:  Left foot weakness.  Sometimes her left hand cramps and closes, toes on left foot cramp.  Sensory:  She has neuropathy involving the right leg.   Pain:  Burning pins and needles sensation along the lateral right leg from foot to hip.  No back pain.   Gait:  Unsteady.  Tendency to lean backwards.  Left foot drags.  Balance a little worse.  No falls. Bowel/Bladder:  No issues. Fatigue:  Yes but manageable. Cognition:  Once in awhile she may  have word-finding difficulty. Mood:  Some depression from time to time.  Not chronic.    HISTORY: Multiple Sclerosis: She had an initial flare up when she was in her 75s.  She had an episode of numbness from the waist down and difficulty with fine-finger movement, lasting 3-4 months.  After several years, she then started dragging her left foot.  She was formally diagnosed with multiple sclerosis in 2005 after MRI and CSF analysis.  She was started on Copaxone but stopped about 2 years ago.  She has not had any relapses but has had a gradual decline over the last several years, primary affecting her balance and gait.    Past disease modifying therapy:  Copaxone (stopped in 2017- 2018 because she no longer had a neurologist)  Seizure Disorder: She also has a seizure disorder, possibly secondary to MS.  She has had a total of 3 seizures.  Last seizure was in the mid-90s.  Past anti-epileptic medications:  Dilantin  PAST MEDICAL HISTORY: Past Medical History:  Diagnosis Date  . Breast cancer ([place])   . Hypertension   . Multiple sclerosis ([place])   . Seizures ([place])     MEDICATIONS: Current Outpatient Medications on File Prior to Visit  Medication Sig Dispense Refill  . amLODipine (NORVASC) 5 MG tablet TAKE 1 TABLET BY MOUTH EVERY DAY 90 tablet 1  . ascorbic acid (VITAMIN C) 500 MG/5ML syrup Take by mouth daily.    . cetirizine (ZYRTEC) 10 MG tablet Take 1 tablet (10 mg total) by mouth daily. 30 tablet 11  . cholecalciferol (VITAMIN D) 25 MCG (1000 UT) tablet Take 1 tablet (1,000 Units  total) by mouth daily. 90 tablet 3  . gabapentin (NEURONTIN) 300 MG capsule Take 1 capsule (300 mg total) by mouth 3 (three) times daily. 90 capsule 5  . HYDROcodone-acetaminophen (NORCO/VICODIN) 5-325 MG tablet Take 1 tablet by mouth every 6 (six) hours as needed for moderate pain. 90 tablet 0  . lamoTRIgine (LAMICTAL) 100 MG tablet TAKE 1 TABLET BY MOUTH EVERY DAY 90 tablet 1  . loratadine (CLARITIN) 10 MG  tablet Take 1 tablet (10 mg total) by mouth daily. 30 tablet 11  . losartan (COZAAR) 50 MG tablet Take 1 tablet (50 mg total) by mouth daily. 90 tablet 1   No current facility-administered medications on file prior to visit.    ALLERGIES: No Known Allergies  FAMILY HISTORY: Family History  Problem Relation Age of Onset  . Sickle cell anemia Father     SOCIAL HISTORY: Social History   Socioeconomic History  . Marital status: Widowed    Spouse name: Not on file  . Number of children: 2  . Years of education: Not on file  . Highest education level: Not on file  Occupational History  . Not on file  Tobacco Use  . Smoking status: Former Smoker    Types: Cigarettes  . Smokeless tobacco: Never Used  Substance and Sexual Activity  . Alcohol use: Yes  . Drug use: Never  . Sexual activity: Not Currently  Other Topics Concern  . Not on file  Social History Narrative   Patient  Is right-handed. She is widowed, lives alone in a one level home. She drinks tea or coffee 1 cup a day. She is unable to exercise.   Social Determinants of Health   Financial Resource Strain:   . Difficulty of Paying Living Expenses: Not on file  Food Insecurity:   . Worried About Charity fundraiser in the Last Year: Not on file  . Ran Out of Food in the Last Year: Not on file  Transportation Needs:   . Lack of Transportation (Medical): Not on file  . Lack of Transportation (Non-Medical): Not on file  Physical Activity:   . Days of Exercise per Week: Not on file  . Minutes of Exercise per Session: Not on file  Stress:   . Feeling of Stress : Not on file  Social Connections:   . Frequency of Communication with Friends and Family: Not on file  . Frequency of Social Gatherings with Friends and Family: Not on file  . Attends Religious Services: Not on file  . Active Member of Clubs or Organizations: Not on file  . Attends Archivist Meetings: Not on file  . Marital Status: Not on file    Intimate Partner Violence:   . Fear of Current or Ex-Partner: Not on file  . Emotionally Abused: Not on file  . Physically Abused: Not on file  . Sexually Abused: Not on file    PHYSICAL EXAM: Blood pressure 134/83, pulse 83, resp. rate 18, height 5\' 6"  (1.676 m), weight 149 lb (67.6 kg), SpO2 100 %. General: No acute distress.  Patient appears well-groomed.   Head:  Normocephalic/atraumatic Eyes:  Fundi examined but not visualized Neck: supple, no paraspinal tenderness, full range of motion Heart:  Regular rate and rhythm Lungs:  Clear to auscultation bilaterally Back: No paraspinal tenderness Neurological Exam: alert and oriented to person, place, and time. Attention span and concentration intact, recent and remote memory intact, fund of knowledge intact.  Speech fluent and not dysarthric, language  intact.  CN II-XII intact. Bulk and tone normal; muscle strength 4+/5 left upper extremity; 2+/5 left hip flexion, left foot drop/externally rotated;  Otherwise 5/5 throughout.  Sensation to pinpricke recued in left upper and lower extremities; and vibration sensation reduced in lower extremities (right worse than left).  Deep tendon reflexes 2+ throughout, left Babinski.  Finger to nose past pointing.  Left hemiplegic gait with left foot externally rotated.  IMPRESSION: 1.  Multiple sclerosis 2.  Localization-related epilepsy, stable 3.  Cervical spinal stenosis.  There is some cord mass effect at C4-5 level but I believe that the MS is the primary cause of her gait/weakness.  I offered evaluation by neurosurgery but she declines.  PLAN: 1.  Lamotrigine 100mg  daily for seizure prophylaxis 2.  Gabapentin 300mg  twice daily for pain 3.  D3 4000 IU daily.  Check vitamin D level today.  Metta Clines, DO  CC: Grant Fontana, MD

## 2019-09-25 ENCOUNTER — Other Ambulatory Visit: Payer: Medicare Other

## 2019-09-25 ENCOUNTER — Encounter: Payer: Self-pay | Admitting: Neurology

## 2019-09-25 ENCOUNTER — Other Ambulatory Visit: Payer: Self-pay

## 2019-09-25 ENCOUNTER — Ambulatory Visit (INDEPENDENT_AMBULATORY_CARE_PROVIDER_SITE_OTHER): Payer: Medicare Other | Admitting: Neurology

## 2019-09-25 VITALS — BP 134/83 | HR 83 | Resp 18 | Ht 66.0 in | Wt 149.0 lb

## 2019-09-25 DIAGNOSIS — G35 Multiple sclerosis: Secondary | ICD-10-CM

## 2019-09-25 DIAGNOSIS — M4802 Spinal stenosis, cervical region: Secondary | ICD-10-CM

## 2019-09-25 DIAGNOSIS — E559 Vitamin D deficiency, unspecified: Secondary | ICD-10-CM | POA: Diagnosis not present

## 2019-09-25 LAB — VITAMIN D 25 HYDROXY (VIT D DEFICIENCY, FRACTURES): Vit D, 25-Hydroxy: 43 ng/mL (ref 30–100)

## 2019-09-25 NOTE — Patient Instructions (Addendum)
1. Will check vitamin D level 2.  Follow up in 6 months  Your provider has requested that you have labwork completed today. Please go to [facility] ([id number]) on the second floor of this building before leaving the office today. You do not need to check in. If you are not called within 15 minutes please check with the front desk.

## 2019-10-04 ENCOUNTER — Other Ambulatory Visit: Payer: Self-pay | Admitting: Family Medicine

## 2019-10-04 MED ORDER — HYDROCODONE-ACETAMINOPHEN 5-325 MG PO TABS: 1.0000 | ORAL_TABLET | Freq: Four times a day (QID) | ORAL | 0 refills | Status: DC | PRN

## 2019-10-04 NOTE — Telephone Encounter (Signed)
Medication Refill - Medication: HYDROcodone-acetaminophen (NORCO/VICODIN) 5-325 MG tablet   Has the patient contacted their pharmacy? No. (Agent: If no, request that the patient contact the pharmacy for the refill.) (Agent: If yes, when and what did the pharmacy advise?)  Preferred Pharmacy (with phone number or street name):  [id number] [address], [address] Phone:  [phone]  Fax:  [phone]       Agent: Please be advised that RX refills may take up to 3 business days. We ask that you follow-up with your pharmacy.

## 2019-10-04 NOTE — Telephone Encounter (Signed)
Requested medication (s) are due for refill today: yes  Requested medication (s) are on the active medication list: yes  Last refill:  06/07/2019  Future visit scheduled: yes  Notes to clinic:  not delegated   Requested Prescriptions  Pending Prescriptions Disp Refills   HYDROcodone-acetaminophen (NORCO/VICODIN) 5-325 MG tablet 90 tablet 0    Sig: Take 1 tablet by mouth every 6 (six) hours as needed for moderate pain.      Not Delegated - Analgesics:  Opioid Agonist Combinations Failed - 10/04/2019  1:49 PM      Failed - This refill cannot be delegated      Failed - Urine Drug Screen completed in last 360 days.      Passed - Valid encounter within last 6 months    Recent Outpatient Visits           4 months ago Essential hypertension   [facility], Lilia Argue, MD   9 months ago Essential hypertension   [facility], Lilia Argue, MD   1 year ago Essential hypertension   [facility], Lilia Argue, MD       Future Appointments             In 1 month Rutherford Guys, MD Primary [facility] at [place], [facility]

## 2019-10-04 NOTE — Telephone Encounter (Signed)
Patient is requesting a refill of the following medications: Requested Prescriptions   Pending Prescriptions Disp Refills  . HYDROcodone-acetaminophen (NORCO/VICODIN) 5-325 MG tablet 90 tablet 0    Sig: Take 1 tablet by mouth every 6 (six) hours as needed for moderate pain.     Last office visit: 06/07/2019 Date of last refill: 05/31/2019 Last refill amount: 90

## 2019-10-04 NOTE — Telephone Encounter (Signed)
pmp reviewd, appropriate meds refilled 

## 2019-10-18 DIAGNOSIS — Z20822 Contact with and (suspected) exposure to covid-19: Secondary | ICD-10-CM | POA: Diagnosis not present

## 2019-11-01 ENCOUNTER — Ambulatory Visit: Payer: Medicare Other

## 2019-11-08 ENCOUNTER — Other Ambulatory Visit: Payer: Self-pay | Admitting: Neurology

## 2019-11-15 ENCOUNTER — Other Ambulatory Visit: Payer: Self-pay | Admitting: Family Medicine

## 2019-11-19 ENCOUNTER — Telehealth: Payer: Self-pay

## 2019-11-19 ENCOUNTER — Other Ambulatory Visit: Payer: Self-pay | Admitting: Family Medicine

## 2019-11-19 NOTE — Telephone Encounter (Signed)
Requested medication (s) are due for refill today: yes  Requested medication (s) are on the active medication list: yes  Last refill: 10/04/19  Future visit scheduled: yes  Notes to clinic:  not delegated    Requested Prescriptions  Pending Prescriptions Disp Refills   HYDROcodone-acetaminophen (NORCO/VICODIN) 5-325 MG tablet 90 tablet 0    Sig: Take 1 tablet by mouth every 6 (six) hours as needed for moderate pain.      Not Delegated - Analgesics:  Opioid Agonist Combinations Failed - 11/19/2019  9:07 AM      Failed - This refill cannot be delegated      Failed - Urine Drug Screen completed in last 360 days.      Passed - Valid encounter within last 6 months    Recent Outpatient Visits           5 months ago Essential hypertension   [facility], Lilia Argue, MD   10 months ago Essential hypertension   [facility], Lilia Argue, MD   1 year ago Essential hypertension   [facility], Lilia Argue, MD       Future Appointments             In 1 week Rutherford Guys, MD Primary Care at [place], [facility]

## 2019-11-19 NOTE — Telephone Encounter (Signed)
Medication refill: HYDROcodone-acetaminophen (NORCO/VICODIN) 5-325 MG tablet [id number]      Ph[facility]  [id number] [place], [place] Phone:  [phone]  Fax:  [phone]      Pt aware of turn around time.

## 2019-11-19 NOTE — Telephone Encounter (Signed)
Sent message to provider to refill

## 2019-11-19 NOTE — Telephone Encounter (Signed)
Patient is requesting a refill of the following medications: Requested Prescriptions    No prescriptions requested or ordered in this encounter  Hydro/Apap 5-325 mg  Date of patient request: 11/19/2019 Last office visit: 05/31/2019 Date of last refill: 10/04/2019 Last refill amount: 90 tablets Follow up time period per chart: 11/30/2019 appt

## 2019-11-20 MED ORDER — HYDROCODONE-ACETAMINOPHEN 5-325 MG PO TABS: 1.0000 | ORAL_TABLET | Freq: Four times a day (QID) | ORAL | 0 refills | Status: DC | PRN

## 2019-11-20 NOTE — Telephone Encounter (Signed)
pmp reviewd, appropriate meds refilled 

## 2019-11-30 ENCOUNTER — Ambulatory Visit (INDEPENDENT_AMBULATORY_CARE_PROVIDER_SITE_OTHER): Payer: Medicare Other | Admitting: Family Medicine

## 2019-11-30 ENCOUNTER — Encounter: Payer: Self-pay | Admitting: Family Medicine

## 2019-11-30 ENCOUNTER — Other Ambulatory Visit: Payer: Self-pay

## 2019-11-30 VITALS — BP 122/80 | HR 83 | Temp 98.0°F | Ht 66.0 in | Wt 141.0 lb

## 2019-11-30 DIAGNOSIS — G35 Multiple sclerosis: Secondary | ICD-10-CM

## 2019-11-30 DIAGNOSIS — Z5181 Encounter for therapeutic drug level monitoring: Secondary | ICD-10-CM

## 2019-11-30 DIAGNOSIS — R0982 Postnasal drip: Secondary | ICD-10-CM | POA: Diagnosis not present

## 2019-11-30 DIAGNOSIS — M792 Neuralgia and neuritis, unspecified: Secondary | ICD-10-CM | POA: Diagnosis not present

## 2019-11-30 DIAGNOSIS — I10 Essential (primary) hypertension: Secondary | ICD-10-CM | POA: Diagnosis not present

## 2019-11-30 DIAGNOSIS — J309 Allergic rhinitis, unspecified: Secondary | ICD-10-CM | POA: Diagnosis not present

## 2019-11-30 DIAGNOSIS — N1831 Chronic kidney disease, stage 3a: Secondary | ICD-10-CM

## 2019-11-30 DIAGNOSIS — G40109 Localization-related (focal) (partial) symptomatic epilepsy and epileptic syndromes with simple partial seizures, not intractable, without status epilepticus: Secondary | ICD-10-CM

## 2019-11-30 LAB — LIPID PANEL

## 2019-11-30 LAB — CBC

## 2019-11-30 MED ORDER — FLUTICASONE PROPIONATE 50 MCG/ACT NA SUSP
1.0000 | Freq: Every day | NASAL | 6 refills | Status: DC
Start: 2019-11-30 — End: 2023-01-14

## 2019-11-30 MED ORDER — LOSARTAN POTASSIUM 100 MG PO TABS: 100.0000 mg | ORAL_TABLET | Freq: Every day | ORAL | 3 refills | Status: DC

## 2019-11-30 NOTE — Patient Instructions (Signed)
° ° ° °  If you have lab work done today you will be contacted with your lab results within the next 2 weeks.  If you have not heard from us then please contact us. The fastest way to get your results is to register for My Chart. ° ° °IF you received an x-ray today, you will receive an invoice from [facility]. Please contact [facility] at [phone] with questions or concerns regarding your invoice.  ° °IF you received labwork today, you will receive an invoice from [facility]. Please contact [facility] at [phone] with questions or concerns regarding your invoice.  ° °Our billing staff will not be able to assist you with questions regarding bills from these companies. ° °You will be contacted with the lab results as soon as they are available. The fastest way to get your results is to activate your My Chart account. Instructions are located on the last page of this paperwork. If you have not heard from us regarding the results in 2 weeks, please contact this office. °  ° ° ° °

## 2019-11-30 NOTE — Progress Notes (Signed)
4/23/202111:22 AM  [place] 17-Apr-1951, 69 y.o., female [id number]  Chief Complaint  Patient presents with  . Hypertension    pharmacy gave patient 50 mg losartan , she takes 1 daily  . post nasal drip    causing cough all night     HPI:   Patient is a 69 y.o. female with past medical history significant for HTN, CKD3, right breast cancer in 2014, seizure disorder, MS and chronic neuropathic pain who presents today for routine followup  Last OV oct 2020 She is doing well, uses cane Has no acute concern today other than postnasal drip Has established with Dr Tomi Likens, neuro for MS and epilepsy, last OV feb 2021 Continues to go back forth between [place] and previous home Has been taking amlodipine 7m and losartan 1025mShe is not taking cymbalta She does continue to take gabapentin daily and vicodin prn  [facility] 11/30/2019 05/31/2019 09/15/2018  Decreased Interest 0 1 0  Down, Depressed, Hopeless 0 1 0  PHQ - 2 Score 0 2 0  Altered sleeping - 0 -  Tired, decreased energy - 0 -  Change in appetite - 0 -  Feeling bad or failure about yourself  - 1 -  Trouble concentrating - 0 -  Moving slowly or fidgety/restless - 0 -  Suicidal thoughts - 0 -  PHQ-9 Score - 3 -    Fall Risk  11/30/2019 09/25/2019 05/31/2019 03/23/2019 09/15/2018  Falls in the past year? 0 0 1 1 0  Number falls in past yr: 0 0 0 0 -  Injury with Fall? 0 0 0 0 -  Risk for fall due to : - - - - -  Follow up Falls evaluation completed - - - -     No Known Allergies  Prior to Admission medications   Medication Sig Start Date End Date Taking? Authorizing Provider  amLODipine (NORVASC) 5 MG tablet TAKE 1 TABLET BY MOUTH EVERY DAY 08/13/19  Yes SaRutherford GuysMD  anastrozole (ARIMIDEX) 1 MG tablet Take 1 mg by mouth daily. 09/13/19  Yes [provider]  ascorbic acid (VITAMIN C) 500 MG/5ML syrup Take by mouth daily.   Yes [provider]  cholecalciferol (VITAMIN D) 25  MCG (1000 UT) tablet Take 1 tablet (1,000 Units total) by mouth daily. 09/15/18  Yes SaRutherford GuysMD  gabapentin (NEURONTIN) 300 MG capsule Take 1 capsule (300 mg total) by mouth 3 (three) times daily. 03/23/19  Yes Jaffe, Adam R, DO  HYDROcodone-acetaminophen (NORCO/VICODIN) 5-325 MG tablet Take 1 tablet by mouth every 6 (six) hours as needed for moderate pain. 11/20/19  Yes SaRutherford GuysMD  lamoTRIgine (LAMICTAL) 100 MG tablet TAKE 1 TABLET BY MOUTH EVERY DAY 11/08/19  Yes Jaffe, Adam R, DO  loratadine (CLARITIN) 10 MG tablet Take 1 tablet (10 mg total) by mouth daily. 05/31/19  Yes SaRutherford GuysMD  losartan (COZAAR) 50 MG tablet Take 1 tablet (50 mg total) by mouth daily. 05/31/19  Yes SaRutherford GuysMD  cetirizine (ZYRTEC) 10 MG tablet Take 1 tablet (10 mg total) by mouth daily. Patient not taking: Reported on 11/30/2019 06/07/19   SaRutherford GuysMD  DULoxetine (CYMBALTA) 30 MG capsule duloxetine 30 mg capsule,delayed release    [provider]  DULoxetine (CYMBALTA) 60 MG capsule duloxetine 60 mg capsule,delayed release    [provider]  losartan (COZAAR) 100 MG tablet Take 100 mg by mouth daily. 08/13/19  [provider]  olmesartan (BENICAR) 40 MG tablet Take 40 mg by mouth daily. 11/11/19   [provider]    Past Medical History:  Diagnosis Date  . Breast cancer ([facility])   . Hypertension   . Multiple sclerosis ([facility])   . Seizures ([facility])     Past Surgical History:  Procedure Laterality Date  . BREAST SURGERY      Social History   Tobacco Use  . Smoking status: Former Smoker    Types: Cigarettes  . Smokeless tobacco: Never Used  Substance Use Topics  . Alcohol use: Yes    Family History  Problem Relation Age of Onset  . Sickle cell anemia Father     Review of Systems  Constitutional: Negative for chills and fever.  Respiratory: Negative for cough and shortness of breath.   Cardiovascular: Negative for chest pain,  palpitations and leg swelling.  Gastrointestinal: Negative for abdominal pain, nausea and vomiting.     OBJECTIVE:  Today's Vitals   11/30/19 1105 11/30/19 1115  BP: (!) 143/84 122/80  Pulse: 83   Temp: 98 F (36.7 C)   SpO2: 95%   Weight: 141 lb (64 kg)   Height: 5' 6" (1.676 m)    Body mass index is 22.76 kg/m.   Physical Exam Vitals and nursing note reviewed.  Constitutional:      Appearance: She is well-developed.  HENT:     Head: Normocephalic and atraumatic.     Mouth/Throat:     Pharynx: No oropharyngeal exudate.  Eyes:     General: No scleral icterus.    Conjunctiva/sclera: Conjunctivae normal.     Pupils: Pupils are equal, round, and reactive to light.  Cardiovascular:     Rate and Rhythm: Normal rate and regular rhythm.     Heart sounds: Normal heart sounds. No murmur. No friction rub. No gallop.   Pulmonary:     Effort: Pulmonary effort is normal.     Breath sounds: Normal breath sounds. No wheezing or rales.  Musculoskeletal:     Cervical back: Neck supple.  Skin:    General: Skin is warm and dry.  Neurological:     Mental Status: She is alert and oriented to person, place, and time.     No results found for this or any previous visit (from the past 24 hour(s)).  No results found.   ASSESSMENT and PLAN  1. Essential hypertension Controlled. Continue current regime.  - CBC - CMP14+EGFR - Lipid panel  2. Multiple sclerosis ([facility]) 3. Neuropathic pain 4. Medication monitoring encounter MS managed by neuro. Pain meds rx by me. pmp reviewed. Cont current regime.  - ToxASSURE Select 13 (MW), Urine  5. Localization-related epilepsy ([facility]) Managed by neuro  6. Allergic rhinitis with postnasal drip Adding flonase  7. Stage 3a chronic kidney disease Avoid nephrotoxins - CMP14+EGFR  Other orders - losartan (COZAAR) 100 MG tablet; Take 1 tablet (100 mg total) by mouth daily. - fluticasone (FLONASE) 50 MCG/ACT nasal spray; Place 1 spray into  both nostrils at bedtime.  Return in about 6 months (around 05/31/2020).    Irma M Santiago, MD [facility] [address] [place], [place] [address] Ph.  [phone] Fax [phone]   

## 2019-12-01 LAB — CMP14+EGFR
ALT: 15 IU/L (ref 0–32)
AST: 19 IU/L (ref 0–40)
Albumin/Globulin Ratio: 1.4 (ref 1.2–2.2)
Albumin: 4.7 g/dL (ref 3.8–4.8)
Alkaline Phosphatase: 70 IU/L (ref 39–117)
BUN/Creatinine Ratio: 9 — ABNORMAL LOW (ref 12–28)
BUN: 14 mg/dL (ref 8–27)
Bilirubin Total: 0.4 mg/dL (ref 0.0–1.2)
CO2: 23 mmol/L (ref 20–29)
Calcium: 9.9 mg/dL (ref 8.7–10.3)
Chloride: 103 mmol/L (ref 96–106)
Creatinine, Ser: 1.49 mg/dL — ABNORMAL HIGH (ref 0.57–1.00)
GFR calc Af Amer: 42 mL/min/{1.73_m2} — ABNORMAL LOW (ref >59)
GFR calc non Af Amer: 36 mL/min/{1.73_m2} — ABNORMAL LOW (ref >59)
Globulin, Total: 3.4 g/dL (ref 1.5–4.5)
Glucose: 94 mg/dL (ref 65–99)
Potassium: 4.6 mmol/L (ref 3.5–5.2)
Sodium: 139 mmol/L (ref 134–144)
Total Protein: 8.1 g/dL (ref 6.0–8.5)

## 2019-12-01 LAB — CBC
Hematocrit: 40 % (ref 34.0–46.6)
Hemoglobin: 12.8 g/dL (ref 11.1–15.9)
MCH: 27.2 pg (ref 26.6–33.0)
MCHC: 32 g/dL (ref 31.5–35.7)
MCV: 85 fL (ref 79–97)
Platelets: 241 10*3/uL (ref 150–450)
RBC: 4.71 x10E6/uL (ref 3.77–5.28)
RDW: 14.3 % (ref 11.7–15.4)
WBC: 5.8 10*3/uL (ref 3.4–10.8)

## 2019-12-01 LAB — LIPID PANEL
Chol/HDL Ratio: 2.8 ratio (ref 0.0–4.4)
Cholesterol, Total: 207 mg/dL — ABNORMAL HIGH (ref 100–199)
HDL: 73 mg/dL (ref >39)
LDL Chol Calc (NIH): 116 mg/dL — ABNORMAL HIGH (ref 0–99)
Triglycerides: 102 mg/dL (ref 0–149)
VLDL Cholesterol Cal: 18 mg/dL (ref 5–40)

## 2019-12-04 LAB — TOXASSURE SELECT 13 (MW), URINE

## 2020-01-09 ENCOUNTER — Ambulatory Visit: Payer: Medicare Other

## 2020-01-17 DIAGNOSIS — Z7982 Long term (current) use of aspirin: Secondary | ICD-10-CM | POA: Diagnosis not present

## 2020-01-17 DIAGNOSIS — M1990 Unspecified osteoarthritis, unspecified site: Secondary | ICD-10-CM | POA: Diagnosis not present

## 2020-01-17 DIAGNOSIS — G893 Neoplasm related pain (acute) (chronic): Secondary | ICD-10-CM | POA: Diagnosis not present

## 2020-01-17 DIAGNOSIS — I10 Essential (primary) hypertension: Secondary | ICD-10-CM | POA: Diagnosis not present

## 2020-01-17 DIAGNOSIS — C50411 Malignant neoplasm of upper-outer quadrant of right female breast: Secondary | ICD-10-CM | POA: Diagnosis not present

## 2020-01-17 DIAGNOSIS — Z87891 Personal history of nicotine dependence: Secondary | ICD-10-CM | POA: Diagnosis not present

## 2020-01-17 DIAGNOSIS — J45909 Unspecified asthma, uncomplicated: Secondary | ICD-10-CM | POA: Diagnosis not present

## 2020-01-17 DIAGNOSIS — M8580 Other specified disorders of bone density and structure, unspecified site: Secondary | ICD-10-CM | POA: Diagnosis not present

## 2020-01-17 DIAGNOSIS — G35 Multiple sclerosis: Secondary | ICD-10-CM | POA: Diagnosis not present

## 2020-01-17 DIAGNOSIS — Z170 Estrogen receptor positive status [ER+]: Secondary | ICD-10-CM | POA: Diagnosis not present

## 2020-01-17 DIAGNOSIS — Z79811 Long term (current) use of aromatase inhibitors: Secondary | ICD-10-CM | POA: Diagnosis not present

## 2020-01-17 DIAGNOSIS — C50111 Malignant neoplasm of central portion of right female breast: Secondary | ICD-10-CM | POA: Diagnosis not present

## 2020-01-17 DIAGNOSIS — Z832 Family history of diseases of the blood and blood-forming organs and certain disorders involving the immune mechanism: Secondary | ICD-10-CM | POA: Diagnosis not present

## 2020-01-17 DIAGNOSIS — D649 Anemia, unspecified: Secondary | ICD-10-CM | POA: Diagnosis not present

## 2020-01-17 DIAGNOSIS — G629 Polyneuropathy, unspecified: Secondary | ICD-10-CM | POA: Diagnosis not present

## 2020-01-17 DIAGNOSIS — D573 Sickle-cell trait: Secondary | ICD-10-CM | POA: Diagnosis not present

## 2020-01-17 DIAGNOSIS — N644 Mastodynia: Secondary | ICD-10-CM | POA: Diagnosis not present

## 2020-02-04 ENCOUNTER — Telehealth: Payer: Self-pay | Admitting: Family Medicine

## 2020-02-04 MED ORDER — HYDROCODONE-ACETAMINOPHEN 5-325 MG PO TABS: 1.0000 | ORAL_TABLET | Freq: Four times a day (QID) | ORAL | 0 refills | Status: DC | PRN

## 2020-02-04 NOTE — Telephone Encounter (Signed)
Pmp reviewed Medication refilled

## 2020-02-04 NOTE — Telephone Encounter (Signed)
Pt called and stated she needs the medication below refilled. Pt stated she is going out of town on Wednesday and would like it before then. Pt would like a call when it is sent in.  HYDROcodone-acetaminophen (NORCO/VICODIN) 5-325 MG tablet [[id number]]   [id number] [place] [place] [address]

## 2020-02-04 NOTE — Telephone Encounter (Signed)
Patient is requesting a refill of the following medications: Requested Prescriptions    No prescriptions requested or ordered in this encounter    Date of patient request: 02/04/20 Last office visit: 11/30/19 Date of last refill: 11/20/19 Last refill amount: 90 0RF Follow up time period per chart: 05/30/20

## 2020-02-16 ENCOUNTER — Other Ambulatory Visit: Payer: Self-pay | Admitting: Family Medicine

## 2020-02-16 NOTE — Telephone Encounter (Signed)
Requested Prescriptions  Pending Prescriptions Disp Refills  . amLODipine (NORVASC) 5 MG tablet [Pharmacy Med Name: AMLODIPINE BESYLATE 5 MG TAB] 90 tablet 1    Sig: TAKE 1 TABLET BY MOUTH EVERY DAY     Cardiovascular:  Calcium Channel Blockers Passed - 02/16/2020  8:58 AM      Passed - Last BP in normal range    BP Readings from Last 1 Encounters:  01/09/20 112/80         Passed - Valid encounter within last 6 months    Recent Outpatient Visits          2 months ago Essential hypertension   [facility], Lilia Argue, MD   8 months ago Essential hypertension   [facility], Lilia Argue, MD   1 year ago Essential hypertension   [facility], Lilia Argue, MD   1 year ago Essential hypertension   [facility], Lilia Argue, MD      Future Appointments            In 3 months Rutherford Guys, MD Primary Care at [place], [facility]

## 2020-03-10 ENCOUNTER — Telehealth: Payer: Self-pay | Admitting: Family Medicine

## 2020-03-10 DIAGNOSIS — Z9011 Acquired absence of right breast and nipple: Secondary | ICD-10-CM

## 2020-03-10 DIAGNOSIS — Z1231 Encounter for screening mammogram for malignant neoplasm of breast: Secondary | ICD-10-CM

## 2020-03-10 DIAGNOSIS — Z853 Personal history of malignant neoplasm of breast: Secondary | ICD-10-CM

## 2020-03-10 NOTE — Telephone Encounter (Signed)
Pt is calling to get a referral for  an oncologist . Also she will  Be needing a prescription  for new bras and she is due for a  mammogram    Please advise

## 2020-03-11 NOTE — Telephone Encounter (Signed)
Message received as call from pt requesting referral to oncology, mammo orders and new bra orders. Does it need to be an appt  Or can this be ordered . Last seen April /21 w/ 6 month follow up recommendations.  Please advise

## 2020-03-13 ENCOUNTER — Telehealth: Payer: Self-pay

## 2020-03-13 DIAGNOSIS — Z853 Personal history of malignant neoplasm of breast: Secondary | ICD-10-CM | POA: Insufficient documentation

## 2020-03-13 DIAGNOSIS — Z9011 Acquired absence of right breast and nipple: Secondary | ICD-10-CM | POA: Insufficient documentation

## 2020-03-13 NOTE — Telephone Encounter (Signed)
Please let patient know that referral for oncology and order for mammogram have been placed. Hand written prescription for her mastectomy bra is ready to be picked up at the office. thank

## 2020-03-13 NOTE — Telephone Encounter (Signed)
LVM for pt to let her know that her referrals has been placed per [place].

## 2020-03-13 NOTE — Telephone Encounter (Signed)
Pt order requested for mammo order and bra has been placed at front office for pick up

## 2020-03-20 NOTE — Progress Notes (Signed)
NEUROLOGY FOLLOW UP OFFICE NOTE  Selena Bender [id number]  HISTORY OF PRESENT ILLNESS: Selena Bender is a 15 year oldright-handed black female with hypertension and history of breast cancer who follows up for multiple sclerosis and seizure disorder.  UPDATE: Current disease modifying therapy:None Current seizure prophylaxis: Lamotrigine 100mg  daily Current medications: Gabapentin 300mg  twice daily (fornerve pain, supposed to take 600mg  twice daily), lamotrigine 100mg  daily (for seizure prophylaxis), Percocet (fornerve pain), vitamin D4000IU daily  Vitamin D level from February was 43.  Vision:She got new glasses, which has helped. Motor: Left foot weakness. Sometimes her left hand cramps and closes, toes on left foot cramp.  Sensory: She has neuropathy involving the right leg. Pain: Burning pins and needles sensation along the lateral right leg from foot to hip. No back pain. Usually gabapentin helps.  She uses hydrocodone which is more effective.   Gait: Unsteady. Tendency to lean backwards. Left foot drags.  Balance a little worse.  She had one "little spill" but nothing drastic.    Bowel/Bladder: No issues. Fatigue: Yes but manageable. Cognition: Once in awhile she may have word-finding difficulty. Mood: Some depression from time to time. Not chronic.   HISTORY: Multiple Sclerosis: She had an initial flare up when she was in her 39s.She had an episode of numbness from the waist down and difficulty with fine-finger movement, lasting 3-4 months.After several years, she then started dragging her left foot.She was formally diagnosed with multiple sclerosisin 2005 after MRI and CSF analysis.She was started on Copaxone but stopped about 2 years ago.She has not had any relapses but has had a gradual decline over the last several years, primary affecting her balance and gait.   Past disease modifying therapy:Copaxone (stopped in  2017- 2018 because she no longer had a neurologist)  Seizure Disorder: She also has a seizure disorder,possibly secondary to MS. She has had a total of 3 seizures. Last seizure was in the mid-90s.  Past anti-epileptic medications:Dilantin  Imaging: 05/11/2019 MRI BRAIN W WO:  Advanced chronic demyelinating disease involving the cerebral white matter and cortex as as well as suspected superimposed chronic small vessel ischemia particularly involving the right caudate and cerebellum. 05/11/2019 MRI CERVICAL W WO:  Chronic multifocal patchy demyelination involving the spinal cord and cervicomedullary junction, with severe involvement of left hemicord at C4 level.  Degenerative cervical spine disease with moderate spinal stenosis causing mass effect on left hemicord at C4-5 with moderate to severe bilateral foraminal stenosis; lesser spinal stenosis at C6-7; moderate to severe bilateral foraminal stenosis at C6-7 and C7-T1.   PAST MEDICAL HISTORY: Past Medical History:  Diagnosis Date   Breast cancer ([place])    Hypertension    Multiple sclerosis ([place])    Seizures ([place])     MEDICATIONS: Current Outpatient Medications on File Prior to Visit  Medication Sig Dispense Refill   amLODipine (NORVASC) 5 MG tablet TAKE 1 TABLET BY MOUTH EVERY DAY 90 tablet 1   anastrozole (ARIMIDEX) 1 MG tablet Take 1 mg by mouth daily.     ascorbic acid (VITAMIN C) 500 MG/5ML syrup Take by mouth daily.     cholecalciferol (VITAMIN D) 25 MCG (1000 UT) tablet Take 1 tablet (1,000 Units total) by mouth daily. 90 tablet 3   fluticasone (FLONASE) 50 MCG/ACT nasal spray Place 1 spray into both nostrils at bedtime. 16 g 6   gabapentin (NEURONTIN) 300 MG capsule Take 1 capsule (300 mg total) by mouth 3 (three) times daily. 90 capsule 5  HYDROcodone-acetaminophen (NORCO/VICODIN) 5-325 MG tablet Take 1 tablet by mouth every 6 (six) hours as needed for moderate pain. 90 tablet 0   lamoTRIgine (LAMICTAL) 100  MG tablet TAKE 1 TABLET BY MOUTH EVERY DAY 90 tablet 1   loratadine (CLARITIN) 10 MG tablet Take 1 tablet (10 mg total) by mouth daily. 30 tablet 11   losartan (COZAAR) 100 MG tablet Take 1 tablet (100 mg total) by mouth daily. 90 tablet 3   No current facility-administered medications on file prior to visit.    ALLERGIES: No Known Allergies  FAMILY HISTORY: Family History  Problem Relation Age of Onset   Sickle cell anemia Father    SOCIAL HISTORY: Social History   Socioeconomic History   Marital status: Widowed    Spouse name: Not on file   Number of children: 2   Years of education: Not on file   Highest education level: Not on file  Occupational History   Not on file  Tobacco Use   Smoking status: Former Smoker    Types: Cigarettes   Smokeless tobacco: Never Used  Vaping Use   Vaping Use: Never used  Substance and Sexual Activity   Alcohol use: Yes   Drug use: Never   Sexual activity: Not Currently  Other Topics Concern   Not on file  Social History Narrative   Patient  Is right-handed. She is widowed, lives alone in a one level home. She drinks tea or coffee 1 cup a day. She is unable to exercise.   Social Determinants of Health   Financial Resource Strain:    Difficulty of Paying Living Expenses:   Food Insecurity:    Worried About Charity fundraiser in the Last Year:    Arboriculturist in the Last Year:   Transportation Needs:    Film/video editor (Medical):    Lack of Transportation (Non-Medical):   Physical Activity:    Days of Exercise per Week:    Minutes of Exercise per Session:   Stress:    Feeling of Stress :   Social Connections:    Frequency of Communication with Friends and Family:    Frequency of Social Gatherings with Friends and Family:    Attends Religious Services:    Active Member of Clubs or Organizations:    Attends Music therapist:    Marital Status:   Intimate Partner Violence:     Fear of Current or Ex-Partner:    Emotionally Abused:    Physically Abused:    Sexually Abused:     PHYSICAL EXAM: Blood pressure 138/79, pulse 83, height 5\' 6"  (1.676 m), weight 143 lb (64.9 kg), SpO2 99 %. General: No acute distress.  Patient appears well-groomed.   Head:  Normocephalic/atraumatic Eyes:  Fundi examined but not visualized Neck: supple, no paraspinal tenderness, full range of motion Heart:  Regular rate and rhythm Lungs:  Clear to auscultation bilaterally Back: No paraspinal tenderness Neurological Exam: alert and oriented to person, place, and time. Attention span and concentration intact, recent and remote memory intact, fund of knowledge intact.  Speech fluent and not dysarthric, language intact.  Left eye abducted on primary gaze, mildly reduced adduction of right eye.  Otherwise, CN II-XII intact. Bulk and tone increased on left, muscle strength 4+/5 left upper extremity, 2+/5 left hip flexion, left foot drop/exterally rotated, otherwise 5/5.  Sensation to pinprick reduced in left upper and lower extremities, sensation to vibration reduced in lower extremities (right worse than left).  Deep tendon reflexes 2+ throughout, left Babinski.  Finger to nose past pointing.  Left hemiplegic/spastic gait with externally rotated left foot.  Marland Kitchen  IMPRESSION: 1.  Multiple sclerosis 2.  Localization-related epilepsy, stable 3.  Cervical spinal stenosis.  There is some cord mass effect at C4-5 level but I believe that the MS is the primary cause of her gait/weakness.  Regardless, patient still declines referral to neurosurgery at this time.  PLAN: 1.  Lamotrigine 100mg  daily for seizure prophylaxis 2.  Gabapentin 300mg  twice daily for neuropathic pain 3.  D3 4000 IU daily.   4.  Repeat MRI of brain and cervical spine in 6 months 5.  Follow up after repeat imaging.  Metta Clines, DO  CC:  Grant Fontana, MD

## 2020-03-24 ENCOUNTER — Other Ambulatory Visit: Payer: Self-pay

## 2020-03-24 ENCOUNTER — Ambulatory Visit (INDEPENDENT_AMBULATORY_CARE_PROVIDER_SITE_OTHER): Payer: Medicare Other | Admitting: Neurology

## 2020-03-24 ENCOUNTER — Encounter: Payer: Self-pay | Admitting: Neurology

## 2020-03-24 VITALS — BP 138/79 | HR 83 | Ht 66.0 in | Wt 143.0 lb

## 2020-03-24 DIAGNOSIS — M4802 Spinal stenosis, cervical region: Secondary | ICD-10-CM | POA: Diagnosis not present

## 2020-03-24 DIAGNOSIS — G35 Multiple sclerosis: Secondary | ICD-10-CM | POA: Diagnosis not present

## 2020-03-24 DIAGNOSIS — G40109 Localization-related (focal) (partial) symptomatic epilepsy and epileptic syndromes with simple partial seizures, not intractable, without status epilepticus: Secondary | ICD-10-CM

## 2020-03-24 NOTE — Patient Instructions (Addendum)
Continue lamotrigine and gabapentin Repeat MRI of brain and cervical spine  without contrast in 6 months. We have sent a referral to [facility] for your MRI and they will call you directly to schedule your appointment. They are located at [place]. If you need to contact them directly please call [phone].  Will order physical therapy for gait instability and leg weakness Follow up in 6 months.

## 2020-03-26 ENCOUNTER — Telehealth: Payer: Self-pay | Admitting: Neurology

## 2020-03-26 NOTE — Telephone Encounter (Signed)
Patient called and said she's scheduled an MRI on 04/19/20. She is requesting a sedative to help with her anxiety about the testing process on the day of the test.  [facility] [address]

## 2020-03-27 ENCOUNTER — Other Ambulatory Visit: Payer: Self-pay | Admitting: Neurology

## 2020-03-27 MED ORDER — DIAZEPAM 5 MG PO TABS: ORAL_TABLET | ORAL | 0 refills | Status: DC

## 2020-03-27 NOTE — Telephone Encounter (Signed)
[facility]. Pt advised to have a driver and to take her medication an hour before her appt.

## 2020-03-27 NOTE — Telephone Encounter (Signed)
Prescription sent.  She should be advised to have a driver to and from the MRI facility.  She should take care not to lose the prescription.

## 2020-04-01 ENCOUNTER — Other Ambulatory Visit: Payer: Self-pay

## 2020-04-01 ENCOUNTER — Telehealth: Payer: Self-pay | Admitting: Family Medicine

## 2020-04-01 DIAGNOSIS — M792 Neuralgia and neuritis, unspecified: Secondary | ICD-10-CM

## 2020-04-01 MED ORDER — HYDROCODONE-ACETAMINOPHEN 5-325 MG PO TABS: 1.0000 | ORAL_TABLET | Freq: Four times a day (QID) | ORAL | 0 refills | Status: DC | PRN

## 2020-04-01 NOTE — Progress Notes (Signed)
pmp reviewed  Med refilled 

## 2020-04-01 NOTE — Telephone Encounter (Signed)
PT IS CALLING IN ASKING FOR A REFILL ON  What is the name of the medication?  HYDROcodone-acetaminophen (NORCO/VICODIN) 5-325 MG tablet [[id number]]   Have you contacted your pharmacy to request a refill? Y  Which pharmacy would you like this sent to? [id number] [address]  [place], [place] [address]  Phone:  [phone] Fax:  [phone]    Patient notified that their request is being sent to the clinical staff for review and that they should receive a call once it is complete. If they do not receive a call within 72 hours they can check with their pharmacy or our office.

## 2020-04-17 ENCOUNTER — Ambulatory Visit (INDEPENDENT_AMBULATORY_CARE_PROVIDER_SITE_OTHER): Payer: Medicare Other | Admitting: Family Medicine

## 2020-04-17 VITALS — BP 138/79 | Ht 66.0 in | Wt 143.0 lb

## 2020-04-17 DIAGNOSIS — Z0000 Encounter for general adult medical examination without abnormal findings: Secondary | ICD-10-CM

## 2020-04-17 NOTE — Patient Instructions (Signed)
Thank you for taking time to come for your Medicare Wellness Visit. I appreciate your ongoing commitment to your health goals. Please review the following plan we discussed and let me know if I can assist you in the future.  Dayonna Selbe LPN  Preventive Care 68 Years and Older, Female Preventive care refers to lifestyle choices and visits with your health care provider that can promote health and wellness. This includes:  A yearly physical exam. This is also called an annual well check.  Regular dental and eye exams.  Immunizations.  Screening for certain conditions.  Healthy lifestyle choices, such as diet and exercise. What can I expect for my preventive care visit? Physical exam Your health care provider will check:  Height and weight. These may be used to calculate body mass index (BMI), which is a measurement that tells if you are at a healthy weight.  Heart rate and blood pressure.  Your skin for abnormal spots. Counseling Your health care provider may ask you questions about:  Alcohol, tobacco, and drug use.  Emotional well-being.  Home and relationship well-being.  Sexual activity.  Eating habits.  History of falls.  Memory and ability to understand (cognition).  Work and work environment.  Pregnancy and menstrual history. What immunizations do I need?  Influenza (flu) vaccine  This is recommended every year. Tetanus, diphtheria, and pertussis (Tdap) vaccine  You may need a Td booster every 10 years. Varicella (chickenpox) vaccine  You may need this vaccine if you have not already been vaccinated. Zoster (shingles) vaccine  You may need this after age 68. Pneumococcal conjugate (PCV13) vaccine  One dose is recommended after age 68. Pneumococcal polysaccharide (PPSV23) vaccine  One dose is recommended after age 68. Measles, mumps, and rubella (MMR) vaccine  You may need at least one dose of MMR if you were born in 1957 or later. You may also  need a second dose. Meningococcal conjugate (MenACWY) vaccine  You may need this if you have certain conditions. Hepatitis A vaccine  You may need this if you have certain conditions or if you travel or work in places where you may be exposed to hepatitis A. Hepatitis B vaccine  You may need this if you have certain conditions or if you travel or work in places where you may be exposed to hepatitis B. Haemophilus influenzae type b (Hib) vaccine  You may need this if you have certain conditions. You may receive vaccines as individual doses or as more than one vaccine together in one shot (combination vaccines). Talk with your health care provider about the risks and benefits of combination vaccines. What tests do I need? Blood tests  Lipid and cholesterol levels. These may be checked every 5 years, or more frequently depending on your overall health.  Hepatitis C test.  Hepatitis B test. Screening  Lung cancer screening. You may have this screening every year starting at age 55 if you have a 30-pack-year history of smoking and currently smoke or have quit within the past 15 years.  Colorectal cancer screening. All adults should have this screening starting at age 50 and continuing until age 75. Your health care provider may recommend screening at age 45 if you are at increased risk. You will have tests every 1-10 years, depending on your results and the type of screening test.  Diabetes screening. This is done by checking your blood sugar (glucose) after you have not eaten for a while (fasting). You may have this done every 1-3   years.  Mammogram. This may be done every 1-2 years. Talk with your health care provider about how often you should have regular mammograms.  BRCA-related cancer screening. This may be done if you have a family history of breast, ovarian, tubal, or peritoneal cancers. Other tests  Sexually transmitted disease (STD) testing.  Bone density scan. This is done  to screen for osteoporosis. You may have this done starting at age 94. Follow these instructions at home: Eating and drinking  Eat a diet that includes fresh fruits and vegetables, whole grains, lean protein, and low-fat dairy products. Limit your intake of foods with high amounts of sugar, saturated fats, and salt.  Take vitamin and mineral supplements as recommended by your health care provider.  Do not drink alcohol if your health care provider tells you not to drink.  If you drink alcohol: ? Limit how much you have to 0-1 drink a day. ? Be aware of how much alcohol is in your drink. In the [place] one drink equals one 12 oz bottle of beer (355 mL), one 5 oz glass of wine (148 mL), or one 1 oz glass of hard liquor (44 mL). Lifestyle  Take daily care of your teeth and gums.  Stay active. Exercise for at least 30 minutes on 5 or more days each week.  Do not use any products that contain nicotine or tobacco, such as cigarettes, e-cigarettes, and chewing tobacco. If you need help quitting, ask your health care provider.  If you are sexually active, practice safe sex. Use a condom or other form of protection in order to prevent STIs (sexually transmitted infections).  Talk with your health care provider about taking a low-dose aspirin or statin. What's next?  Go to your health care provider once a year for a well check visit.  Ask your health care provider how often you should have your eyes and teeth checked.  Stay up to date on all vaccines. This information is not intended to replace advice given to you by your health care provider. Make sure you discuss any questions you have with your health care provider. Document Revised: 07/20/2018 Document Reviewed: 07/20/2018 Elsevier Patient Education  2020 [employer].

## 2020-04-17 NOTE — Progress Notes (Signed)
Presents today for [employer] Visit   Date of last exam: 11-30-2019  Interpreter used for this visit? No   I connected with  Selena Bender on 04/17/20 by a telephone  and verified that I am speaking with the correct person using two identifiers.   I discussed the limitations of evaluation and management by telemedicine. The patient expressed understanding and agreed to proceed.  Patient location: home  Provider location: in office  I provided 20 minutes of non face - to - face time during this encounter.  Patient Care Team: Rutherford Guys, MD as PCP - General (Family Medicine) Pieter Partridge, DO as Consulting Physician (Neurology)   Other items to address today:   Discussed Eye/Dental Discussed Immunizations Follow up scheduled Dr. Pamella Pert 10-22 @ 11:00 Patient will get FLU-SHOT at follow up   Other Screening: Last screening for diabetes: 11-30-2019 Last lipid screening: 11-30-2019  ADVANCE DIRECTIVES: Discussed yes On File: no Materials Provided: no  Immunization status:  Immunization History  Administered Date(s) Administered  . Fluad Quad(high Dose 65+) 05/22/2019  . Influenza, High Dose Seasonal PF 09/15/2018  . Influenza-Unspecified 05/06/2014, 04/30/2015, 04/30/2015, 04/30/2016  . Moderna SARS-COVID-2 Vaccination 11/19/2019, 12/18/2019  . Pneumococcal Conjugate-13 06/02/2015  . Pneumococcal Polysaccharide-23 05/31/2019     Health Maintenance Due  Topic Date Due  . TETANUS/TDAP  Never done  . INFLUENZA VACCINE  03/09/2020     Functional Status Survey: Is the patient deaf or have difficulty hearing?: No Does the patient have difficulty seeing, even when wearing glasses/contacts?: No Does the patient have difficulty concentrating, remembering, or making decisions?: No Does the patient have difficulty walking or climbing stairs?: Yes (patient has MS) Does the patient have difficulty dressing or bathing?: No Does the patient  have difficulty doing errands alone such as visiting a doctor's office or shopping?: No   6CIT Screen 04/17/2020 04/17/2020  What Year? 0 points 0 points  What month? - 0 points  What time? - 0 points  Count back from 20 - 0 points  Months in reverse - 0 points  Repeat phrase - 0 points  Total Score - 0        Clinical Support from 04/17/2020 in [facility] at [facility]  AUDIT-C Score 4       Home Environment:   Live in a one story No grab bars No scattered rugs Adequate lighting / no clutter Yes trouble climbing stairs  (patient has MS) Patient does not drive    Patient Active Problem List   Diagnosis Date Noted  . History of right breast cancer 03/13/2020  . Status post right mastectomy 03/13/2020  . Abnormal gait 09/15/2018  . Claustrophobia 09/15/2018  . Hypertension 09/15/2018  . Localization-related epilepsy ([place]) 09/15/2018  . Multiple sclerosis ([place]) 09/15/2018  . Paresthesia 09/15/2018  . Pain in lower limb 12/31/2015  . Seizure ([place]) 12/31/2015  . Neuropathic pain 06/02/2015  . No advance directives 11/02/2012  . Refusal of blood transfusion for reasons of conscience 11/02/2012  . Malignant neoplasm of upper-outer quadrant of right breast in female, estrogen receptor positive ([place]) 11/01/2012     Past Medical History:  Diagnosis Date  . Breast cancer ([place])   . Hypertension   . Multiple sclerosis ([place])   . Seizures ([place])      Past Surgical History:  Procedure Laterality Date  . BREAST SURGERY       Family History  Problem Relation Age of Onset  . Sickle  cell anemia Father      Social History   Socioeconomic History  . Marital status: Widowed    Spouse name: Not on file  . Number of children: 2  . Years of education: Not on file  . Highest education level: Not on file  Occupational History  . Not on file  Tobacco Use  . Smoking status: Former Smoker    Types: Cigarettes  . Smokeless tobacco: Never Used  Vaping Use  . Vaping Use:  Never used  Substance and Sexual Activity  . Alcohol use: Yes  . Drug use: Yes    Types: Marijuana    Comment: Gummies  . Sexual activity: Not Currently  Other Topics Concern  . Not on file  Social History Narrative  . Not on file   Social Determinants of Health   Financial Resource Strain:   . Difficulty of Paying Living Expenses: Not on file  Food Insecurity:   . Worried About Charity fundraiser in the Last Year: Not on file  . Ran Out of Food in the Last Year: Not on file  Transportation Needs:   . Lack of Transportation (Medical): Not on file  . Lack of Transportation (Non-Medical): Not on file  Physical Activity:   . Days of Exercise per Week: Not on file  . Minutes of Exercise per Session: Not on file  Stress:   . Feeling of Stress : Not on file  Social Connections:   . Frequency of Communication with Friends and Family: Not on file  . Frequency of Social Gatherings with Friends and Family: Not on file  . Attends Religious Services: Not on file  . Active Member of Clubs or Organizations: Not on file  . Attends Archivist Meetings: Not on file  . Marital Status: Not on file  Intimate Partner Violence:   . Fear of Current or Ex-Partner: Not on file  . Emotionally Abused: Not on file  . Physically Abused: Not on file  . Sexually Abused: Not on file     No Known Allergies   Prior to Admission medications   Medication Sig Start Date End Date Taking? Authorizing Provider  amLODipine (NORVASC) 5 MG tablet TAKE 1 TABLET BY MOUTH EVERY DAY 02/16/20  Yes Rutherford Guys, MD  anastrozole (ARIMIDEX) 1 MG tablet Take 1 mg by mouth daily. 09/13/19  Yes [provider]  ascorbic acid (VITAMIN C) 500 MG/5ML syrup Take by mouth daily.   Yes [provider]  cholecalciferol (VITAMIN D) 25 MCG (1000 UT) tablet Take 1 tablet (1,000 Units total) by mouth daily. 09/15/18  Yes Rutherford Guys, MD  fluticasone [facility]) 50 MCG/ACT nasal spray Place 1 spray  into both nostrils at bedtime. 11/30/19  Yes Rutherford Guys, MD  gabapentin (NEURONTIN) 300 MG capsule Take 1 capsule (300 mg total) by mouth 3 (three) times daily. 03/23/19  Yes Jaffe, Adam R, DO  HYDROcodone-acetaminophen (NORCO/VICODIN) 5-325 MG tablet Take 1 tablet by mouth every 6 (six) hours as needed for moderate pain. 04/01/20  Yes Rutherford Guys, MD  lamoTRIgine (LAMICTAL) 100 MG tablet TAKE 1 TABLET BY MOUTH EVERY DAY 11/08/19  Yes Jaffe, Adam R, DO  loratadine (CLARITIN) 10 MG tablet Take 1 tablet (10 mg total) by mouth daily. 05/31/19  Yes Rutherford Guys, MD  losartan (COZAAR) 100 MG tablet Take 1 tablet (100 mg total) by mouth daily. 11/30/19  Yes Rutherford Guys, MD  diazepam (VALIUM) 5 MG tablet Take  1 tablet 40 to 60 minutes prior to MRI. Patient not taking: Reported on 04/17/2020 03/27/20   Pieter Partridge, DO     Depression screen [facility] 2/9 04/17/2020 11/30/2019 05/31/2019 09/15/2018  Decreased Interest 0 0 1 0  Down, Depressed, Hopeless 0 0 1 0  PHQ - 2 Score 0 0 2 0  Altered sleeping - - 0 -  Tired, decreased energy - - 0 -  Change in appetite - - 0 -  Feeling bad or failure about yourself  - - 1 -  Trouble concentrating - - 0 -  Moving slowly or fidgety/restless - - 0 -  Suicidal thoughts - - 0 -  PHQ-9 Score - - 3 -     Fall Risk  04/17/2020 03/24/2020 11/30/2019 09/25/2019 05/31/2019  Falls in the past year? 0 1 0 0 1  Number falls in past yr: 0 0 0 0 0  Injury with Fall? 0 0 0 0 0  Risk for fall due to : - - - - -  Follow up Falls evaluation completed;Education provided - Falls evaluation completed - -      PHYSICAL EXAM: BP 138/79 Comment: taken from a previous visit/ not in clinic  Ht 5\' 6"  (1.676 m)   Wt 143 lb (64.9 kg)   BMI 23.08 kg/m    Wt Readings from Last 3 Encounters:  04/17/20 143 lb (64.9 kg)  03/24/20 143 lb (64.9 kg)  01/09/20 141 lb (64 kg)   Medicare annual wellness visit, subsequent    Education/Counseling provided regarding diet and  exercise, prevention of chronic diseases, smoking/tobacco cessation, if applicable, and reviewed "Covered Medicare Preventive Services."

## 2020-04-19 ENCOUNTER — Other Ambulatory Visit: Payer: Medicare Other

## 2020-04-22 ENCOUNTER — Other Ambulatory Visit: Payer: Self-pay | Admitting: Neurology

## 2020-04-22 ENCOUNTER — Telehealth: Payer: Self-pay | Admitting: Neurology

## 2020-04-22 MED ORDER — DIAZEPAM 5 MG PO TABS
ORAL_TABLET | ORAL | 0 refills | Status: DC
Start: 2020-04-22 — End: 2021-10-28

## 2020-04-22 NOTE — Telephone Encounter (Signed)
Patient called and requested another prescription for diazepam 5 MG to be taken before her MRI on 05/12/20.   She previously had an MRI scheduled on 04/19/20. She said she took the medication on the way to that appointment but found that the appointment was cancelled when she got to the facility.  [facility] [address]

## 2020-04-22 NOTE — Telephone Encounter (Signed)
done

## 2020-05-01 ENCOUNTER — Telehealth: Payer: Self-pay

## 2020-05-01 NOTE — Telephone Encounter (Signed)
Faxed order form for dme mastectomy bra to [facility] [phone]

## 2020-05-02 ENCOUNTER — Ambulatory Visit: Payer: Medicare Other

## 2020-05-12 ENCOUNTER — Other Ambulatory Visit: Payer: Medicare Other

## 2020-05-27 ENCOUNTER — Ambulatory Visit
Admission: RE | Admit: 2020-05-27 | Discharge: 2020-05-27 | Disposition: A | Discharge status: Home / Self Care | Payer: Medicare Other | Source: Ambulatory Visit | Attending: Family Medicine | Admitting: Family Medicine

## 2020-05-27 ENCOUNTER — Other Ambulatory Visit: Payer: Self-pay

## 2020-05-27 DIAGNOSIS — Z1231 Encounter for screening mammogram for malignant neoplasm of breast: Secondary | ICD-10-CM | POA: Diagnosis not present

## 2020-05-27 DIAGNOSIS — Z853 Personal history of malignant neoplasm of breast: Secondary | ICD-10-CM

## 2020-05-30 ENCOUNTER — Ambulatory Visit: Payer: Medicare Other | Admitting: Family Medicine

## 2020-06-02 ENCOUNTER — Encounter: Payer: Self-pay | Admitting: Family Medicine

## 2020-06-02 ENCOUNTER — Ambulatory Visit (INDEPENDENT_AMBULATORY_CARE_PROVIDER_SITE_OTHER): Payer: Medicare Other | Admitting: Family Medicine

## 2020-06-02 ENCOUNTER — Other Ambulatory Visit: Payer: Self-pay

## 2020-06-02 VITALS — BP 134/75 | HR 97 | Temp 98.2°F | Ht 66.0 in | Wt 144.0 lb

## 2020-06-02 DIAGNOSIS — M792 Neuralgia and neuritis, unspecified: Secondary | ICD-10-CM | POA: Diagnosis not present

## 2020-06-02 DIAGNOSIS — Z23 Encounter for immunization: Secondary | ICD-10-CM

## 2020-06-02 DIAGNOSIS — G35 Multiple sclerosis: Secondary | ICD-10-CM

## 2020-06-02 DIAGNOSIS — I10 Essential (primary) hypertension: Secondary | ICD-10-CM

## 2020-06-02 DIAGNOSIS — E2839 Other primary ovarian failure: Secondary | ICD-10-CM | POA: Diagnosis not present

## 2020-06-02 MED ORDER — GABAPENTIN 300 MG/6ML PO SOLN: 400.0000 mg | Freq: Three times a day (TID) | ORAL | 12 refills | Status: DC

## 2020-06-02 MED ORDER — HYDROCODONE-ACETAMINOPHEN 5-325 MG PO TABS: 1.0000 | ORAL_TABLET | Freq: Four times a day (QID) | ORAL | 0 refills | Status: DC | PRN

## 2020-06-02 MED ORDER — AMLODIPINE BESYLATE 5 MG PO TABS: 5.0000 mg | ORAL_TABLET | Freq: Every day | ORAL | 3 refills | Status: DC

## 2020-06-02 MED ORDER — VITAMIN D3 25 MCG (1000 UNIT) PO TABS: 4000.0000 [IU] | ORAL_TABLET | Freq: Every day | ORAL | 3 refills | Status: AC

## 2020-06-02 NOTE — Progress Notes (Signed)
10/25/20213:10 PM  [place] 1950-10-28, 69 y.o., female [id number]  Chief Complaint  Patient presents with  . Transitions Of Care  . NEUROPATHIC PAIN    MS  . R shoulder pain    x 2 weeeks wakes her up at night at times    HPI:   Patient is a 69 y.o. female with past medical history significant for HTN, CKD3, right breast cancer in 2014, seizure disorder, MS and chronic neuropathic painwho presents today for routine followup.  Neurology: Dr. Tomi Likens Manages MS, localization-related epilepsy and Cervical spinal stenosis (declines neurosurgery) 1.  Lamotrigine 100mg  daily for seizure prophylaxis 2.  Gabapentin 300mg  twice daily for neuropathic pain 3.  D3 4000 IU daily.   4.  Repeat MRI of brain and cervical spine in 6 months 5.  Follow up after repeat imaging.  HTN Stable on current regimen losartan 100mg , and amlodipine 5mg  Goal<140/90 BP Readings from Last 3 Encounters:  06/02/20 134/75  04/17/20 138/79  03/24/20 138/79   Nerve Pain Gabapentin 600 mg in the am and 300 mg in the evening Would prefer to be liquid form Tingling pins and needles type of pain, burning  Screenings Mammogram: Last 05/27/2020 Dexa scan last 07/14/2017: Follow up 2 years PaP: no longer needed COVID: done Flu: 06/02/2020 Tetanus: Pharmacy? Date? Shingrix:  Years ago did 2 doses.  Left ankle swelling only for a week for 2 Denies recent injury, pain with movement, calf pain, SOB       Depression screen [facility] 2/9 06/02/2020 04/17/2020 11/30/2019  Decreased Interest 0 0 0  Down, Depressed, Hopeless 0 0 0  PHQ - 2 Score 0 0 0  Altered sleeping - - -  Tired, decreased energy - - -  Change in appetite - - -  Feeling bad or failure about yourself  - - -  Trouble concentrating - - -  Moving slowly or fidgety/restless - - -  Suicidal thoughts - - -  PHQ-9 Score - - -    Fall Risk  06/02/2020 04/17/2020 03/24/2020 11/30/2019 09/25/2019  Falls in the past year? 1 0 1 0 0  Number  falls in past yr: 0 0 0 0 0  Injury with Fall? 0 0 0 0 0  Risk for fall due to : - - - - -  Follow up Falls evaluation completed Falls evaluation completed;Education provided - Falls evaluation completed -     No Known Allergies  Prior to Admission medications   Medication Sig Start Date End Date Taking? Authorizing Provider  amLODipine (NORVASC) 5 MG tablet TAKE 1 TABLET BY MOUTH EVERY DAY 02/16/20   Rutherford Guys, MD  anastrozole (ARIMIDEX) 1 MG tablet Take 1 mg by mouth daily. 09/13/19   [provider]  ascorbic acid (VITAMIN C) 500 MG/5ML syrup Take by mouth daily.    [provider]  cholecalciferol (VITAMIN D) 25 MCG (1000 UT) tablet Take 1 tablet (1,000 Units total) by mouth daily. 09/15/18   Rutherford Guys, MD  diazepam (VALIUM) 5 MG tablet Take 1 tablet 40 to 60 minutes prior to MRI. 04/22/20   Tomi Likens, Adam R, DO  fluticasone (FLONASE) 50 MCG/ACT nasal spray Place 1 spray into both nostrils at bedtime. 11/30/19   Rutherford Guys, MD  gabapentin (NEURONTIN) 300 MG capsule Take 1 capsule (300 mg total) by mouth 3 (three) times daily. 03/23/19   Pieter Partridge, DO  HYDROcodone-acetaminophen (NORCO/VICODIN) 5-325 MG tablet Take 1 tablet by mouth every 6 (  six) hours as needed for moderate pain. 04/01/20   Rutherford Guys, MD  lamoTRIgine (LAMICTAL) 100 MG tablet TAKE 1 TABLET BY MOUTH EVERY DAY 11/08/19   Tomi Likens, Adam R, DO  loratadine (CLARITIN) 10 MG tablet Take 1 tablet (10 mg total) by mouth daily. 05/31/19   Rutherford Guys, MD  losartan (COZAAR) 100 MG tablet Take 1 tablet (100 mg total) by mouth daily. 11/30/19   Rutherford Guys, MD    Past Medical History:  Diagnosis Date  . Breast cancer ([place]) 2014   Right  . Hypertension   . Multiple sclerosis ([place])   . Seizures ([place])     Past Surgical History:  Procedure Laterality Date  . BREAST SURGERY    . MASTECTOMY Right 2014    Social History   Tobacco Use  . Smoking status: Former Smoker    Types:  Cigarettes  . Smokeless tobacco: Never Used  Substance Use Topics  . Alcohol use: Yes    Family History  Problem Relation Age of Onset  . Sickle cell anemia Father     Review of Systems  Constitutional: Negative for chills, fever and weight loss.  Eyes: Negative for blurred vision and double vision.  Respiratory: Negative for cough, shortness of breath and wheezing.   Cardiovascular: Negative for chest pain, palpitations and leg swelling.  Gastrointestinal: Negative for abdominal pain, blood in stool, constipation, diarrhea, heartburn, nausea and vomiting.  Genitourinary: Negative for dysuria, frequency and hematuria.  Musculoskeletal: Negative for back pain and joint pain.  Skin: Negative for rash.  Neurological: Positive for tingling. Negative for dizziness, focal weakness, seizures, weakness and headaches.     OBJECTIVE:  Today's Vitals   06/02/20 1425  BP: 134/75  Pulse: 97  Temp: 98.2 F (36.8 C)  SpO2: 99%  Weight: 144 lb (65.3 kg)  Height: 5\' 6"  (1.676 m)   Body mass index is 23.24 kg/m.   Physical Exam Constitutional:      General: She is not in acute distress.    Appearance: Normal appearance. She is not ill-appearing.  HENT:     Head: Normocephalic.  Cardiovascular:     Rate and Rhythm: Normal rate and regular rhythm.     Pulses: Normal pulses.     Heart sounds: Normal heart sounds. No murmur heard.  No friction rub. No gallop.   Pulmonary:     Effort: Pulmonary effort is normal. No respiratory distress.     Breath sounds: Normal breath sounds. No stridor. No wheezing, rhonchi or rales.  Abdominal:     General: Bowel sounds are normal.     Palpations: Abdomen is soft.     Tenderness: There is no abdominal tenderness.  Musculoskeletal:     Right lower leg: No edema.     Left lower leg: Edema present.  Skin:    General: Skin is warm and dry.  Neurological:     Mental Status: She is alert and oriented to person, place, and time. Mental status is  at baseline.     Sensory: No sensory deficit.     Motor: No weakness.  Psychiatric:        Mood and Affect: Mood normal.        Behavior: Behavior normal.     No results found for this or any previous visit (from the past 24 hour(s)).  No results found.   ASSESSMENT and PLAN  Problem List Items Addressed This Visit      Nervous and Auditory  Multiple sclerosis (HCC)   Relevant Medications   cholecalciferol (VITAMIN D) 25 MCG (1000 UNIT) tablet Continue to follow up with neurology     Other   Neuropathic pain   Relevant Medications   HYDROcodone-acetaminophen (NORCO/VICODIN) 5-325 MG tablet   gabapentin (NEURONTIN) 300 MG/6ML solution: Increased to 400mg  tid in a solution    Other Visit Diagnoses    Immunization due    -  Primary   Relevant Orders   Flu Vaccine QUAD High Dose(Fluad) (Completed)   Essential hypertension       Relevant Medications   amLODipine (NORVASC) 5 MG tablet Stable on current regimen At goal < 140/90   Estrogen deficiency       Relevant Orders   DG Bone Density: Per last scan follow up in 2 years.    Follow up on Tetanus vaccine Instructions given on setting up home mychart    Return in about 3 months (around 09/02/2020) for Medication follow-up.    Huston Foley Dontario Evetts, FNP-BC Primary Care at [place], [place] [address] Ph.  [phone] Fax [phone]

## 2020-06-02 NOTE — Patient Instructions (Addendum)
Let me know if the leg swelling doesn't improve Check into your tetanus vaccine, need every 10 years Increase gabapentin from 300 mg to 400 mg 3 times per day. Switched to liquid, let me know if there are any issues. Order placed for repeat DEXA.      RE: MyChart  Dear Ms. Durbin makes it easy for you to view your health information - all in one secure location - from any computer or mobile device at any time. Use the activation code below to enroll in MyChart online at [email or web address]   Once your account is activated, you can:  Marland Kitchen View your test results. . Communicate securely with your physician's office.  . View your medical history, allergies, medications, and immunizations. . Receive care virtually through an e-Visit.   If you are over 18, you may use features of MyChart to manage the health information of your spouse, children or others you care for.  Download child and adult access forms at [email or web address].    As you activate your MyChart account and need any technical assistance, please call the MyChart technical support line at [phone]).  Be sure to also download the MyChart app for your mobile device.   Thank you for choosing [facility] for your family's health care needs!   MyChart Activation Code:  [email or web address] Expires: 07/11/2020 12:25 PM             [facility]  [address] [place], [place] [address] If you have lab work done today you will be contacted with your lab results within the next 2 weeks.  If you have not heard from [place] then please contact us. The fastest way to get your results is to register for My Chart.   IF you received an x-ray today, you will receive an invoice from [facility]. Please contact [facility] at [phone] with questions or concerns regarding your invoice.   IF you received labwork today, you will receive an invoice from [facility]. Please  contact [facility] at [phone] with questions or concerns regarding your invoice.   Our billing staff will not be able to assist you with questions regarding bills from these companies.  You will be contacted with the lab results as soon as they are available. The fastest way to get your results is to activate your My Chart account. Instructions are located on the last page of this paperwork. If you have not heard from [place] regarding the results in 2 weeks, please contact this office.

## 2020-06-06 ENCOUNTER — Ambulatory Visit
Admission: RE | Admit: 2020-06-06 | Discharge: 2020-06-06 | Disposition: A | Discharge status: Home / Self Care | Payer: Medicare Other | Source: Ambulatory Visit | Attending: Neurology | Admitting: Neurology

## 2020-06-06 ENCOUNTER — Other Ambulatory Visit: Payer: Self-pay

## 2020-06-06 DIAGNOSIS — M4802 Spinal stenosis, cervical region: Secondary | ICD-10-CM

## 2020-06-06 DIAGNOSIS — R9082 White matter disease, unspecified: Secondary | ICD-10-CM | POA: Diagnosis not present

## 2020-06-06 DIAGNOSIS — I6381 Other cerebral infarction due to occlusion or stenosis of small artery: Secondary | ICD-10-CM | POA: Diagnosis not present

## 2020-06-06 DIAGNOSIS — G35 Multiple sclerosis: Secondary | ICD-10-CM

## 2020-06-06 DIAGNOSIS — G9389 Other specified disorders of brain: Secondary | ICD-10-CM | POA: Diagnosis not present

## 2020-06-13 ENCOUNTER — Ambulatory Visit: Payer: Medicare Other | Admitting: Family Medicine

## 2020-07-28 ENCOUNTER — Other Ambulatory Visit: Payer: Self-pay | Admitting: Family Medicine

## 2020-07-28 DIAGNOSIS — M792 Neuralgia and neuritis, unspecified: Secondary | ICD-10-CM

## 2020-07-28 MED ORDER — HYDROCODONE-ACETAMINOPHEN 5-325 MG PO TABS: 1.0000 | ORAL_TABLET | Freq: Four times a day (QID) | ORAL | 0 refills | Status: DC | PRN

## 2020-07-28 NOTE — Telephone Encounter (Signed)
Patient requesting HYDROcodone-acetaminophen (NORCO/VICODIN) 5-325 MG tablet , informed please allow 48 to 72 hour turn around time  [id number] [place], [address] Phone:  [phone]  Fax:  [phone]

## 2020-07-28 NOTE — Telephone Encounter (Signed)
Requested medication (s) are due for refill today: no  Requested medication (s) are on the active medication list: yes  Last refill: 06/02/2020  Future visit scheduled: yes  Notes to clinic:  this refill cannot be delegated    Requested Prescriptions  Pending Prescriptions Disp Refills   HYDROcodone-acetaminophen (NORCO/VICODIN) 5-325 MG tablet 60 tablet 0    Sig: Take 1 tablet by mouth every 6 (six) hours as needed for moderate pain.      There is no refill protocol information for this order

## 2020-07-28 NOTE — Telephone Encounter (Signed)
Please advise  Patient is requesting a refill of the following medications: Requested Prescriptions   Pending Prescriptions Disp Refills  . HYDROcodone-acetaminophen (NORCO/VICODIN) 5-325 MG tablet 60 tablet 0    Sig: Take 1 tablet by mouth every 6 (six) hours as needed for moderate pain.    Date of patient request: 07/28/20  Last office visit:06/02/20  Date of last refill: 06/02/20 Last refill amount: 60 tab, 0 refills Follow up time period per chart: 08/26/20

## 2020-08-26 ENCOUNTER — Encounter: Payer: Medicare Other | Admitting: Family Medicine

## 2020-08-27 ENCOUNTER — Encounter: Payer: Self-pay | Admitting: Family Medicine

## 2020-08-28 ENCOUNTER — Other Ambulatory Visit: Payer: Self-pay | Admitting: Neurology

## 2020-09-03 ENCOUNTER — Encounter: Payer: Self-pay | Admitting: Family Medicine

## 2020-09-03 ENCOUNTER — Ambulatory Visit (INDEPENDENT_AMBULATORY_CARE_PROVIDER_SITE_OTHER): Payer: Medicare Other | Admitting: Family Medicine

## 2020-09-03 ENCOUNTER — Ambulatory Visit: Payer: Medicare Other | Admitting: Family Medicine

## 2020-09-03 ENCOUNTER — Other Ambulatory Visit: Payer: Self-pay

## 2020-09-03 VITALS — BP 146/78 | HR 86 | Temp 97.9°F | Wt 144.0 lb

## 2020-09-03 DIAGNOSIS — G40109 Localization-related (focal) (partial) symptomatic epilepsy and epileptic syndromes with simple partial seizures, not intractable, without status epilepticus: Secondary | ICD-10-CM | POA: Diagnosis not present

## 2020-09-03 DIAGNOSIS — Z170 Estrogen receptor positive status [ER+]: Secondary | ICD-10-CM | POA: Diagnosis not present

## 2020-09-03 DIAGNOSIS — C50411 Malignant neoplasm of upper-outer quadrant of right female breast: Secondary | ICD-10-CM

## 2020-09-03 DIAGNOSIS — E2839 Other primary ovarian failure: Secondary | ICD-10-CM

## 2020-09-03 DIAGNOSIS — N1831 Chronic kidney disease, stage 3a: Secondary | ICD-10-CM

## 2020-09-03 DIAGNOSIS — L918 Other hypertrophic disorders of the skin: Secondary | ICD-10-CM | POA: Diagnosis not present

## 2020-09-03 DIAGNOSIS — I10 Essential (primary) hypertension: Secondary | ICD-10-CM | POA: Diagnosis not present

## 2020-09-03 DIAGNOSIS — G35 Multiple sclerosis: Secondary | ICD-10-CM | POA: Diagnosis not present

## 2020-09-03 DIAGNOSIS — E559 Vitamin D deficiency, unspecified: Secondary | ICD-10-CM | POA: Diagnosis not present

## 2020-09-03 MED ORDER — AMLODIPINE BESYLATE 10 MG PO TABS: 10.0000 mg | ORAL_TABLET | Freq: Every day | ORAL | 3 refills | Status: DC

## 2020-09-03 NOTE — Progress Notes (Signed)
1/26/202211:11 AM  [place] November 07, 1950, 70 y.o., female [id number]  Chief Complaint  Patient presents with  . Hypertension    Patient is here for her regular check up and would like to have her labs drawn    HPI:   Patient is a 70 y.o. female with past medical history significant for HTN, CKD3, right breast cancer in 2014, seizure disorder,MSand chronic neuropathic painwho presents today for routine followup.   HTN Amlodipine at 5 Losartan at 100 Continues to work  On LFM and diet Does not like the idea of adding another medication BP not at goal< 130/80  BP Readings from Last 3 Encounters:  09/03/20 (!) 146/78  06/02/20 134/75  04/17/20 138/79   New Skin tag to face Continues to have ongoing issues of contractures due to [place] Maintenance  Topic Date Due  . TETANUS/TDAP  Never done  . COVID-19 Vaccine (3 - Moderna risk 4-dose series) 01/15/2020  . MAMMOGRAM  05/27/2022  . COLONOSCOPY (Pts 45-43yrs Insurance coverage will need to be confirmed)  12/09/2027  . INFLUENZA VACCINE  Completed  . DEXA SCAN  Completed  . Hepatitis C Screening  Completed  . PNA vac Low Risk Adult  Completed    Depression screen [facility] 2/9 09/03/2020 06/02/2020 04/17/2020  Decreased Interest 0 0 0  Down, Depressed, Hopeless 0 0 0  PHQ - 2 Score 0 0 0  Altered sleeping - - -  Tired, decreased energy - - -  Change in appetite - - -  Feeling bad or failure about yourself  - - -  Trouble concentrating - - -  Moving slowly or fidgety/restless - - -  Suicidal thoughts - - -  PHQ-9 Score - - -    Fall Risk  09/03/2020 06/02/2020 04/17/2020 03/24/2020 11/30/2019  Falls in the past year? 0 1 0 1 0  Number falls in past yr: 0 0 0 0 0  Injury with Fall? 0 0 0 0 0  Risk for fall due to : - - - - -  Follow up Falls evaluation completed Falls evaluation completed Falls evaluation completed;Education provided - Falls evaluation completed     No Known Allergies  Prior to Admission  medications   Medication Sig Start Date End Date Taking? Authorizing Provider  amLODipine (NORVASC) 5 MG tablet Take 1 tablet (5 mg total) by mouth daily. 06/02/20  Yes Vian Fluegel, Laurita Quint, FNP  anastrozole (ARIMIDEX) 1 MG tablet Take 1 mg by mouth daily. 09/13/19  Yes [provider]  ascorbic acid (VITAMIN C) 500 MG/5ML syrup Take by mouth daily.   Yes [provider]  cholecalciferol (VITAMIN D) 25 MCG (1000 UNIT) tablet Take 4 tablets (4,000 Units total) by mouth daily. 06/02/20  Yes Vannesa Abair, Laurita Quint, FNP  fluticasone (FLONASE) 50 MCG/ACT nasal spray Place 1 spray into both nostrils at bedtime. 11/30/19  Yes Jacelyn Pi, Lilia Argue, MD  gabapentin (NEURONTIN) 300 MG/6ML solution Take 8 mLs (400 mg total) by mouth 3 (three) times daily. 06/02/20  Yes Azani Brogdon, Laurita Quint, FNP  HYDROcodone-acetaminophen (NORCO/VICODIN) 5-325 MG tablet Take 1 tablet by mouth every 6 (six) hours as needed for moderate pain. 07/28/20  Yes Sean Macwilliams, Laurita Quint, FNP  lamoTRIgine (LAMICTAL) 100 MG tablet TAKE 1 TABLET BY MOUTH EVERY DAY 08/28/20  Yes Tat, Eustace Quail, DO  loratadine (CLARITIN) 10 MG tablet Take 1 tablet (10 mg total) by mouth daily. 05/31/19  Yes Jacelyn Pi, Lilia Argue, MD  losartan (COZAAR) 100  MG tablet Take 1 tablet (100 mg total) by mouth daily. 11/30/19  Yes Jacelyn Pi, Lilia Argue, MD  diazepam (VALIUM) 5 MG tablet Take 1 tablet 40 to 60 minutes prior to MRI. Patient not taking: Reported on 09/03/2020 04/22/20   Pieter Partridge, DO    Past Medical History:  Diagnosis Date  . Breast cancer ([place]) 2014   Right  . Hypertension   . Multiple sclerosis ([place])   . Seizures ([place])     Past Surgical History:  Procedure Laterality Date  . BREAST SURGERY    . MASTECTOMY Right 2014    Social History   Tobacco Use  . Smoking status: Former Smoker    Types: Cigarettes  . Smokeless tobacco: Never Used  Substance Use Topics  . Alcohol use: Yes    Family History  Problem Relation Age of Onset  . Sickle cell  anemia Father     Review of Systems  Constitutional: Negative for chills, fever and malaise/fatigue.  Eyes: Negative for blurred vision and double vision.  Respiratory: Negative for cough, shortness of breath and wheezing.   Cardiovascular: Negative for chest pain, palpitations and leg swelling.  Gastrointestinal: Negative for abdominal pain, blood in stool, constipation, diarrhea, heartburn, nausea and vomiting.  Genitourinary: Negative for dysuria, frequency and hematuria.  Musculoskeletal: Negative for back pain and joint pain.  Skin: Negative for rash.       Skin tag to face  Neurological: Negative for dizziness, weakness and headaches.     OBJECTIVE:  Today's Vitals   09/03/20 1110  BP: (!) 146/78  Pulse: 86  Temp: 97.9 F (36.6 C)  SpO2: 98%  Weight: 144 lb (65.3 kg)   Body mass index is 23.24 kg/m.   Physical Exam Constitutional:      General: She is not in acute distress.    Appearance: Normal appearance. She is not ill-appearing.  HENT:     Head: Normocephalic.  Cardiovascular:     Rate and Rhythm: Normal rate and regular rhythm.     Pulses: Normal pulses.     Heart sounds: Normal heart sounds. No murmur heard. No friction rub. No gallop.   Pulmonary:     Effort: Pulmonary effort is normal. No respiratory distress.     Breath sounds: Normal breath sounds. No stridor. No wheezing, rhonchi or rales.  Abdominal:     General: Bowel sounds are normal.     Palpations: Abdomen is soft.     Tenderness: There is no abdominal tenderness.  Musculoskeletal:     Right lower leg: No edema.     Left lower leg: No edema.  Skin:    General: Skin is warm and dry.  Neurological:     Mental Status: She is alert and oriented to person, place, and time.  Psychiatric:        Mood and Affect: Mood normal.        Behavior: Behavior normal.     No results found for this or any previous visit (from the past 24 hour(s)).  No results found.   ASSESSMENT and  PLAN  Problem List Items Addressed This Visit      Nervous and Auditory   Localization-related epilepsy ([place])   Multiple sclerosis ([place]) - Primary   Relevant Orders   CBC with Differential/Platelet   VITAMIN D 25 Hydroxy (Vit-D Deficiency, Fractures)   TSH   Ambulatory referral to Occupational Therapy     Other   Malignant neoplasm of upper-outer quadrant of right  breast in female, estrogen receptor positive ([place])    Other Visit Diagnoses    Essential hypertension       Relevant Medications   amLODipine (NORVASC) 10 MG tablet   Other Relevant Orders   Lipid panel   Amb ref to Medical Nutrition Therapy-MNT   Stage 3a chronic kidney disease ([place])       Relevant Orders   CMP14+EGFR   Estrogen deficiency       Relevant Orders   DG Bone Density   Vitamin D deficiency, unspecified        Relevant Orders   VITAMIN D 25 Hydroxy (Vit-D Deficiency, Fractures)   Essential (primary) hypertension        Relevant Medications   amLODipine (NORVASC) 10 MG tablet   Other Relevant Orders   TSH   Skin tag          Plan . Increase amlodipine from 5 mg to 10 mg for BP goal< 130/80 continue losartan 100 . Working on [employer]: referral to nutritionist placed  . Declined dermatology referral at this time for skin tag removal . Order placed for DEXA scan  . Will follow up with lab results . Referral to OT to work on hand contractures . Contact info provided for Oncologist to f/u with Breast cancer medication (referral placed prev.)   Return in about 3 months (around 12/02/2020).    Huston Foley Javionna Leder, FNP-BC Primary Care at [place], [place] [address] Ph.  [phone] Fax [phone]

## 2020-09-03 NOTE — Patient Instructions (Addendum)
Call for Breast cancer follow up: [phone]     [email or web address].[email or web address]">  DASH Eating Plan DASH stands for Dietary Approaches to Stop Hypertension. The DASH eating plan is a healthy eating plan that has been shown to:  Reduce high blood pressure (hypertension).  Reduce your risk for type 2 diabetes, heart disease, and stroke.  Help with weight loss. What are tips for following this plan? Reading food labels  Check food labels for the amount of salt (sodium) per serving. Choose foods with less than 5 percent of the Daily Value of sodium. Generally, foods with less than 300 milligrams (mg) of sodium per serving fit into this eating plan.  To find whole grains, look for the word "whole" as the first word in the ingredient list. Shopping  Buy products labeled as "low-sodium" or "no salt added."  Buy fresh foods. Avoid canned foods and pre-made or frozen meals. Cooking  Avoid adding salt when cooking. Use salt-free seasonings or herbs instead of table salt or sea salt. Check with your health care provider or pharmacist before using salt substitutes.  Do not fry foods. Cook foods using healthy methods such as baking, boiling, grilling, roasting, and broiling instead.  Cook with heart-healthy oils, such as olive, canola, avocado, soybean, or sunflower oil. Meal planning  Eat a balanced diet that includes: ? 4 or more servings of fruits and 4 or more servings of vegetables each day. Try to fill one-half of your plate with fruits and vegetables. ? 6-8 servings of whole grains each day. ? Less than 6 oz (170 g) of lean meat, poultry, or fish each day. A 3-oz (85-g) serving of meat is about the same size as a deck of cards. One egg equals 1 oz (28 g). ? 2-3 servings of low-fat dairy each day. One serving is 1 cup (237 mL). ? 1 serving of nuts, seeds, or beans 5 times each week. ? 2-3 servings of heart-healthy fats. Healthy fats called omega-3  fatty acids are found in foods such as walnuts, flaxseeds, fortified milks, and eggs. These fats are also found in cold-water fish, such as sardines, salmon, and mackerel.  Limit how much you eat of: ? Canned or prepackaged foods. ? Food that is high in trans fat, such as some fried foods. ? Food that is high in saturated fat, such as fatty meat. ? Desserts and other sweets, sugary drinks, and other foods with added sugar. ? Full-fat dairy products.  Do not salt foods before eating.  Do not eat more than 4 egg yolks a week.  Try to eat at least 2 vegetarian meals a week.  Eat more home-cooked food and less restaurant, buffet, and fast food.   Lifestyle  When eating at a restaurant, ask that your food be prepared with less salt or no salt, if possible.  If you drink alcohol: ? Limit how much you use to:  0-1 drink a day for women who are not pregnant.  0-2 drinks a day for men. ? Be aware of how much alcohol is in your drink. In the [place] one drink equals one 12 oz bottle of beer (355 mL), one 5 oz glass of wine (148 mL), or one 1 oz glass of hard liquor (44 mL). General information  Avoid eating more than 2,300 mg of salt a day. If you have hypertension, you may need to reduce your sodium intake to 1,500 mg a day.  Work with your health care provider to maintain a healthy body weight  or to lose weight. Ask what an ideal weight is for you.  Get at least 30 minutes of exercise that causes your heart to beat faster (aerobic exercise) most days of the week. Activities may include walking, swimming, or biking.  Work with your health care provider or dietitian to adjust your eating plan to your individual calorie needs. What foods should I eat? Fruits All fresh, dried, or frozen fruit. Canned fruit in natural juice (without added sugar). Vegetables Fresh or frozen vegetables (raw, steamed, roasted, or grilled). Low-sodium or reduced-sodium tomato and vegetable juice. Low-sodium or  reduced-sodium tomato sauce and tomato paste. Low-sodium or reduced-sodium canned vegetables. Grains Whole-grain or whole-wheat bread. Whole-grain or whole-wheat pasta. Brown rice. [place]. Bulgur. Whole-grain and low-sodium cereals. Pita bread. Low-fat, low-sodium crackers. Whole-wheat flour tortillas. Meats and other proteins Skinless chicken or [place]. Ground chicken or [place]. Pork with fat trimmed off. Fish and seafood. Egg whites. Dried beans, peas, or lentils. Unsalted nuts, nut butters, and seeds. Unsalted canned beans. Lean cuts of beef with fat trimmed off. Low-sodium, lean precooked or cured meat, such as sausages or meat loaves. Dairy Low-fat (1%) or fat-free (skim) milk. Reduced-fat, low-fat, or fat-free cheeses. Nonfat, low-sodium ricotta or cottage cheese. Low-fat or nonfat yogurt. Low-fat, low-sodium cheese. Fats and oils Soft margarine without trans fats. Vegetable oil. Reduced-fat, low-fat, or light mayonnaise and salad dressings (reduced-sodium). Canola, safflower, olive, avocado, soybean, and sunflower oils. Avocado. Seasonings and condiments Herbs. Spices. Seasoning mixes without salt. Other foods Unsalted popcorn and pretzels. Fat-free sweets. The items listed above may not be a complete list of foods and beverages you can eat. Contact a dietitian for more information. What foods should I avoid? Fruits Canned fruit in a light or heavy syrup. Fried fruit. Fruit in cream or butter sauce. Vegetables Creamed or fried vegetables. Vegetables in a cheese sauce. Regular canned vegetables (not low-sodium or reduced-sodium). Regular canned tomato sauce and paste (not low-sodium or reduced-sodium). Regular tomato and vegetable juice (not low-sodium or reduced-sodium). Angie Fava. Olives. Grains Baked goods made with fat, such as croissants, muffins, or some breads. Dry pasta or rice meal packs. Meats and other proteins Fatty cuts of meat. Ribs. Fried meat. Berniece Salines. Bologna,  salami, and other precooked or cured meats, such as sausages or meat loaves. Fat from the back of a pig (fatback). Bratwurst. Salted nuts and seeds. Canned beans with added salt. Canned or smoked fish. Whole eggs or egg yolks. Chicken or [place] with skin. Dairy Whole or 2% milk, cream, and half-and-half. Whole or full-fat cream cheese. Whole-fat or sweetened yogurt. Full-fat cheese. Nondairy creamers. Whipped toppings. Processed cheese and cheese spreads. Fats and oils Butter. Stick margarine. Lard. Shortening. Ghee. Bacon fat. Tropical oils, such as coconut, palm kernel, or palm oil. Seasonings and condiments Onion salt, garlic salt, seasoned salt, table salt, and sea salt. Worcestershire sauce. Tartar sauce. Barbecue sauce. Teriyaki sauce. Soy sauce, including reduced-sodium. Steak sauce. Canned and packaged gravies. Fish sauce. Oyster sauce. Cocktail sauce. Store-bought horseradish. Ketchup. Mustard. Meat flavorings and tenderizers. Bouillon cubes. Hot sauces. Pre-made or packaged marinades. Pre-made or packaged taco seasonings. Relishes. Regular salad dressings. Other foods Salted popcorn and pretzels. The items listed above may not be a complete list of foods and beverages you should avoid. Contact a dietitian for more information. Where to find more information  National Heart, Lung, and Blood Institute: https://wilson-eaton.com/  American Heart Association: www.heart.org  Academy of Nutrition and Dietetics: www.eatright.[place]: www.kidney.org Summary  The DASH eating  plan is a healthy eating plan that has been shown to reduce high blood pressure (hypertension). It may also reduce your risk for type 2 diabetes, heart disease, and stroke.  When on the DASH eating plan, aim to eat more fresh fruits and vegetables, whole grains, lean proteins, low-fat dairy, and heart-healthy fats.  With the DASH eating plan, you should limit salt (sodium) intake to 2,300 mg a day. If you  have hypertension, you may need to reduce your sodium intake to 1,500 mg a day.  Work with your health care provider or dietitian to adjust your eating plan to your individual calorie needs. This information is not intended to replace advice given to you by your health care provider. Make sure you discuss any questions you have with your health care provider. Document Revised: 06/29/2019 Document Reviewed: 06/29/2019 Elsevier Patient Education  2021 [employer].    If you have lab work done today you will be contacted with your lab results within the next 2 weeks.  If you have not heard from [place] then please contact us. The fastest way to get your results is to register for My Chart.   IF you received an x-ray today, you will receive an invoice from [facility]. Please contact [facility] at [phone] with questions or concerns regarding your invoice.   IF you received labwork today, you will receive an invoice from [facility]. Please contact [facility] at [phone] with questions or concerns regarding your invoice.   Our billing staff will not be able to assist you with questions regarding bills from these companies.  You will be contacted with the lab results as soon as they are available. The fastest way to get your results is to activate your My Chart account. Instructions are located on the last page of this paperwork. If you have not heard from [place] regarding the results in 2 weeks, please contact this office.

## 2020-09-04 LAB — CBC WITH DIFFERENTIAL/PLATELET
Basophils Absolute: 0.1 10*3/uL (ref 0.0–0.2)
Basos: 1 %
EOS (ABSOLUTE): 0.2 10*3/uL (ref 0.0–0.4)
Eos: 2 %
Hematocrit: 35.6 % (ref 34.0–46.6)
Hemoglobin: 11.9 g/dL (ref 11.1–15.9)
Immature Grans (Abs): 0 10*3/uL (ref 0.0–0.1)
Immature Granulocytes: 0 %
Lymphocytes Absolute: 2.5 10*3/uL (ref 0.7–3.1)
Lymphs: 40 %
MCH: 27.5 pg (ref 26.6–33.0)
MCHC: 33.4 g/dL (ref 31.5–35.7)
MCV: 82 fL (ref 79–97)
Monocytes Absolute: 0.6 10*3/uL (ref 0.1–0.9)
Monocytes: 9 %
Neutrophils Absolute: 3 10*3/uL (ref 1.4–7.0)
Neutrophils: 48 %
Platelets: 244 10*3/uL (ref 150–450)
RBC: 4.33 x10E6/uL (ref 3.77–5.28)
RDW: 14.8 % (ref 11.7–15.4)
WBC: 6.3 10*3/uL (ref 3.4–10.8)

## 2020-09-04 LAB — CMP14+EGFR
ALT: 13 IU/L (ref 0–32)
AST: 22 IU/L (ref 0–40)
Albumin/Globulin Ratio: 1.4 (ref 1.2–2.2)
Albumin: 4.6 g/dL (ref 3.8–4.8)
Alkaline Phosphatase: 72 IU/L (ref 44–121)
BUN/Creatinine Ratio: 13 (ref 12–28)
BUN: 24 mg/dL (ref 8–27)
Bilirubin Total: 0.3 mg/dL (ref 0.0–1.2)
CO2: 21 mmol/L (ref 20–29)
Calcium: 9.9 mg/dL (ref 8.7–10.3)
Chloride: 103 mmol/L (ref 96–106)
Creatinine, Ser: 1.9 mg/dL — ABNORMAL HIGH (ref 0.57–1.00)
GFR calc Af Amer: 31 mL/min/{1.73_m2} — ABNORMAL LOW (ref >59)
GFR calc non Af Amer: 27 mL/min/{1.73_m2} — ABNORMAL LOW (ref >59)
Globulin, Total: 3.2 g/dL (ref 1.5–4.5)
Glucose: 94 mg/dL (ref 65–99)
Potassium: 5 mmol/L (ref 3.5–5.2)
Sodium: 138 mmol/L (ref 134–144)
Total Protein: 7.8 g/dL (ref 6.0–8.5)

## 2020-09-04 LAB — LIPID PANEL
Chol/HDL Ratio: 2.5 ratio (ref 0.0–4.4)
Cholesterol, Total: 204 mg/dL — ABNORMAL HIGH (ref 100–199)
HDL: 82 mg/dL (ref >39)
LDL Chol Calc (NIH): 113 mg/dL — ABNORMAL HIGH (ref 0–99)
Triglycerides: 51 mg/dL (ref 0–149)
VLDL Cholesterol Cal: 9 mg/dL (ref 5–40)

## 2020-09-04 LAB — VITAMIN D 25 HYDROXY (VIT D DEFICIENCY, FRACTURES): Vit D, 25-Hydroxy: 45 ng/mL (ref 30.0–100.0)

## 2020-09-04 LAB — TSH: TSH: 1.25 u[IU]/mL (ref 0.450–4.500)

## 2020-09-07 ENCOUNTER — Other Ambulatory Visit: Payer: Self-pay | Admitting: Family Medicine

## 2020-09-07 DIAGNOSIS — N1832 Chronic kidney disease, stage 3b: Secondary | ICD-10-CM

## 2020-09-07 DIAGNOSIS — E7849 Other hyperlipidemia: Secondary | ICD-10-CM

## 2020-09-07 MED ORDER — ROSUVASTATIN CALCIUM 10 MG PO TABS: 10.0000 mg | ORAL_TABLET | Freq: Every day | ORAL | 3 refills | Status: DC

## 2020-09-07 NOTE — Progress Notes (Signed)
Overall labs look good. Cholesterol is elevated I would recommend starting daily crestor for that. Any questions we can se up a virtual to discuss further. Kidney labs remain elevated. Increase fluids as able and limit any NSAID (Ibuprofen, Aleve) medications.

## 2020-09-08 ENCOUNTER — Other Ambulatory Visit: Payer: Self-pay | Admitting: Family Medicine

## 2020-09-11 ENCOUNTER — Ambulatory Visit: Payer: Medicare Other | Attending: Family Medicine | Admitting: Occupational Therapy

## 2020-09-11 ENCOUNTER — Other Ambulatory Visit: Payer: Self-pay

## 2020-09-11 ENCOUNTER — Encounter: Payer: Self-pay | Admitting: Occupational Therapy

## 2020-09-11 DIAGNOSIS — R278 Other lack of coordination: Secondary | ICD-10-CM | POA: Insufficient documentation

## 2020-09-11 DIAGNOSIS — R29818 Other symptoms and signs involving the nervous system: Secondary | ICD-10-CM | POA: Insufficient documentation

## 2020-09-11 DIAGNOSIS — M6281 Muscle weakness (generalized): Secondary | ICD-10-CM | POA: Insufficient documentation

## 2020-09-11 DIAGNOSIS — M25642 Stiffness of left hand, not elsewhere classified: Secondary | ICD-10-CM | POA: Insufficient documentation

## 2020-09-11 NOTE — Therapy (Signed)
[address] [place], [place], [address] Phone: [phone]   Fax:  [phone]  Occupational Therapy Evaluation  Patient Details  Name: Selena Bender MRN: [id number] Date of Birth: [birth year] Referring Provider (OT): Huston Foley Just, FNP   Encounter Date: 09/11/2020   OT End of Session - 09/11/20 1349    Visit Number 1    Number of Visits 9    Date for OT Re-Evaluation 11/06/20    Authorization Type Medicare    OT Start Time 1108   pt arrived late   OT Stop Time 1145    OT Time Calculation (min) 37 min    Equipment Utilized During Treatment 9-HPT, goniometer, dynamometer and pinch gauge    Activity Tolerance Patient tolerated treatment well;No increased pain    Behavior During Therapy WFL for tasks assessed/performed           Past Medical History:  Diagnosis Date  . Breast cancer ([place]) 2014   Right  . Hypertension   . Multiple sclerosis ([place])   . Seizures ([place])     Past Surgical History:  Procedure Laterality Date  . BREAST SURGERY    . MASTECTOMY Right 2014    There were no vitals filed for this visit.   Subjective Assessment - 09/11/20 1114    Subjective  Pt reports she has had MS for many years now, but recently she has noticed her L hand/fingers tightening up when at rest (particularly the L long, ring, and little fingers) and is worried about developing contractures. She also reports having difficulty primarily with coordination and fine motor tasks. Additionally, pt mentioned wanting to work on improving her balance and is noticing short lapses in her short-term memory.    Patient is accompanied by: Family member   Cousin Regino Schultze*)   Pertinent History Multiple sclerosis and chronic neuropathic pain, HTN, Stage 3 CKD, R breast cancer in 2014, seizure disorder    Patient Stated Goals Work on functional use of L hand, balance, and preventing contractures    Currently in Pain? Yes   MS with  chronic neuropathic pain, primarily in LEs   Pain Score 4     Pain Location Leg    Pain Orientation Right;Left    Pain Descriptors / Indicators Burning;Pins and needles    Pain Type Chronic pain    Pain Onset More than a month ago    Pain Frequency Intermittent             [facility] OT Assessment - 09/11/20 1115      Assessment   Medical Diagnosis L hand stiffness; MS    Referring Provider (OT) Huston Foley Just, FNP    Hand Dominance Right    Next MD Visit 12/02/20      Prior Function   Level of Independence Requires assistive device for independence;Needs assistance with homemaking    Vocation Retired    [employer] in ED admission    Leisure going out to eat; puzzles on phone      ADL   Eating/Feeding Modified independent   Able to cut foods, but requires increased time   Grooming Independent    Upper Body Dressing Needs assist for fasteners;Increased time    Lower Body Dressing Needs assist for fasteners;Increased time    ADL comments Pt reports no significant functional difficulties with BADLs aside from tasks that involve [facility] and manipulatives      IADL   Shopping Needs to be  accompanied on any shopping trip    Light Housekeeping Performs light daily tasks such as dishwashing, bed making    Meal Prep Plans, prepares and serves adequate meals independently   Requires increased time   Community Mobility Relies on family or friends for transportation   Never an independent driver   Medication Management Is responsible for taking medication in correct dosages at correct time    Physiological scientist day-to-day purchases, but needs help with banking, major purchases, etc.      Cognition   Overall Cognitive Status Cognition to be further assessed in functional context PRN    Memory Impaired    Memory Impairment Decreased short term memory   Per pt report     Sensation   Light Touch Appears Intact    Hot/Cold Appears Intact   per pt report      Coordination   Gross Motor Movements are Fluid and Coordinated No    Fine Motor Movements are Fluid and Coordinated No    Coordination and Movement Description Pt consistently demo'd difficulty coordinating wrist and hand into testing position for AROM; required verbal, visual, and tactile cues as well as extended time    Right 9 Hole Peg Test 56    Left 9 Hole Peg Test 138      AROM   Overall AROM Comments Difficult to assess due to limitations with coordination    Right Wrist Extension 67 Degrees    Right Wrist Flexion 35 Degrees    Right Wrist Radial Deviation 19 Degrees    Right Wrist Ulnar Deviation 43 Degrees    Left Wrist Extension 39 Degrees    Left Wrist Flexion 46 Degrees    Left Wrist Radial Deviation 12 Degrees    Left Wrist Ulnar Deviation 33 Degrees      PROM   Overall PROM  Within functional limits for tasks performed    Overall PROM Comments Slightly decreased finger extension of L long and ring finger      Right Hand AROM   R Index  MCP 0-90 67 Degrees    R Long  MCP 0-90 90 Degrees    R Ring  MCP 0-90 92 Degrees    R Little  MCP 0-90 90 Degrees      Left Hand AROM   L Index  MCP 0-90 90 Degrees    L Long  MCP 0-90 92 Degrees    L Ring  MCP 0-90 87 Degrees    L Little  MCP 0-90 77 Degrees      Hand Function   Right Hand Grip (lbs) 43 lbs    Right Hand Lateral Pinch 16 lbs    Right Hand 3 Point Pinch 8 lbs    Left Hand Grip (lbs) 25 lbs    Left Hand Lateral Pinch 12 lbs    Left 3 point pinch --   Unable to complete           OT Education - 09/11/20 1346    Education Details Education provided on role and purpose of OT, as well as potential interventions and goals for therapy.    Person(s) Educated Patient    Methods Explanation    Comprehension Verbalized understanding            OT Short Term Goals - 09/11/20 1528      OT SHORT TERM GOAL #1   Title Pt will verbalize understanding/return demonstration of initial HEP designed for strengthening  of L hand/fingers  Baseline No current HEP    Time 4    Period Weeks    Status New    Target Date 10/10/20      OT SHORT TERM GOAL #2   Title Pt will be able to use compensatory strategies/assistive devices to manipulate clothing fasteners without assistance in 2/4 trials.    Baseline Requires assistance with clothing fasteners    Time 4    Period Weeks    Status New             OT Long Term Goals - 09/11/20 1510      OT LONG TERM GOAL #1   Title Pt will self-report compliance with wear and care of orthosis at least 90% of the time at home.    Baseline No orthosis for L hand contractures    Time 8    Period Weeks    Status New    Target Date 11/07/20      OT LONG TERM GOAL #2   Title Pt will be Mod I with complete HEP designed for stretching and strengthening of L hand/fingers   May require cueing for compliance   Baseline No current HEP    Time 8    Period Weeks    Status New      OT LONG TERM GOAL #3   Title Pt will demonstrate Mod I with adaptive techniques/assistive devices for participation in UB/LB dressing.    Baseline Significant difficulty manipulating clothing fasteners    Time 8    Period Weeks    Status New            Plan - 09/11/20 1351    Clinical Impression Statement Pt is a 70 y.o. female who presents to OP OT secondary to L hand tightness and decreased coordination secondary to MS. Pt will benefit from skilled occupational therapy services to address decreased strength and coordination, limitations with memory, GM and [place], introduction to AE and assistive devices, and development of an HEP program to improve participation and safety with ADL/IADLs.    OT Occupational Profile and History Detailed Assessment- Review of Records and additional review of physical, cognitive, psychosocial history related to current functional performance    Occupational performance deficits (Please refer to evaluation for details): ADL's;IADL's;Leisure    Body  Structure / Function / Physical Skills ADL;UE functional use;Balance;Body mechanics;Flexibility;Pain;FMC;ROM;Coordination;GMC;Decreased knowledge of use of DME;IADL;Dexterity;Strength;Tone    Psychosocial Skills Environmental  Adaptations    Rehab Potential Good    Clinical Decision Making Several treatment options, min-mod task modification necessary    Comorbidities Affecting Occupational Performance: Presence of comorbidities impacting occupational performance    Modification or Assistance to Complete Evaluation  Min-Moderate modification of tasks or assist with assess necessary to complete eval    OT Frequency 1x / week    OT Duration 8 weeks    OT Treatment/Interventions Self-care/ADL training;Ultrasound;Compression bandaging;DME and/or AE instruction;Patient/family education;Energy conservation;Passive range of motion;Cryotherapy;Splinting;Electrical Stimulation;Moist Heat;Therapeutic exercise;Manual Therapy;Therapeutic activities;Neuromuscular education    Plan Initiate HEP for stretching    Consulted and Agree with Plan of Care Patient           Patient will benefit from skilled therapeutic intervention in order to improve the following deficits and impairments:   Body Structure / Function / Physical Skills: ADL,UE functional use,Balance,Body mechanics,Flexibility,Pain,FMC,ROM,Coordination,GMC,Decreased knowledge of use of DME,IADL,Dexterity,Strength,Tone   Psychosocial Skills: Environmental  Adaptations   Visit Diagnosis: Stiffness of left hand, not elsewhere classified  Other lack of coordination  Muscle weakness (generalized)  Other symptoms and signs involving the nervous system    Problem List Patient Active Problem List   Diagnosis Date Noted  . History of right breast cancer 03/13/2020  . Status post right mastectomy 03/13/2020  . Abnormal gait 09/15/2018  . Claustrophobia 09/15/2018  . Hypertension 09/15/2018  . Localization-related epilepsy ([place]) 09/15/2018   . Multiple sclerosis ([place]) 09/15/2018  . Paresthesia 09/15/2018  . Pain in lower limb 12/31/2015  . Seizure ([place]) 12/31/2015  . Neuropathic pain 06/02/2015  . No advance directives 11/02/2012  . Refusal of blood transfusion for reasons of conscience 11/02/2012  . Malignant neoplasm of upper-outer quadrant of right breast in female, estrogen receptor positive ([place]) 11/01/2012     Kathrine Cords, OTR/L, [place] 09/11/2020, 6:09 PM  [address] [place], [place], [address] Phone: [phone]   Fax:  [phone]  Name: Selena Bender MRN: [id number] Date of Birth: 06/18/1951

## 2020-09-17 ENCOUNTER — Encounter: Payer: Self-pay | Admitting: Occupational Therapy

## 2020-09-17 ENCOUNTER — Other Ambulatory Visit: Payer: Self-pay

## 2020-09-17 ENCOUNTER — Ambulatory Visit: Payer: Medicare Other | Admitting: Occupational Therapy

## 2020-09-17 DIAGNOSIS — R278 Other lack of coordination: Secondary | ICD-10-CM

## 2020-09-17 DIAGNOSIS — R29818 Other symptoms and signs involving the nervous system: Secondary | ICD-10-CM

## 2020-09-17 DIAGNOSIS — M25642 Stiffness of left hand, not elsewhere classified: Secondary | ICD-10-CM

## 2020-09-17 DIAGNOSIS — M6281 Muscle weakness (generalized): Secondary | ICD-10-CM | POA: Diagnosis not present

## 2020-09-17 NOTE — Patient Instructions (Signed)
Access Code: [email or web address] URL: [email or web address] Date: 09/17/2020   Exercises Hand PROM Finger Extension - 2-3 x daily - 1 sets - 15 reps - 10 hold Finger Abduction - 2-3 x daily - 1 sets - 15 reps - 5 hold Finger-to-Thumb Opposition - 2-3 x daily - 1 sets - 15 reps

## 2020-09-17 NOTE — Therapy (Addendum)
[facility] [place], [place], [address] Phone: [phone]   Fax:  [phone]  Occupational Therapy Treatment & Discharge Summary  Patient Details  Name: Selena Bender MRN: [id number] Date of Birth: 1950-09-04 Referring Provider (OT): Huston Foley Just, FNP   Encounter Date: 09/17/2020   OT End of Session - 09/17/20 1116    Visit Number 2    Number of Visits 9    Date for OT Re-Evaluation 11/06/20    Authorization Type Medicare    OT Start Time 1111    OT Stop Time 1145    OT Time Calculation (min) 34 min    Equipment Utilized During Treatment 9-HPT, goniometer, dynamometer and pinch gauge    Activity Tolerance Patient tolerated treatment well;No increased pain    Behavior During Therapy WFL for tasks assessed/performed           Past Medical History:  Diagnosis Date  . Breast cancer ([place]) 2014   Right  . Hypertension   . Multiple sclerosis ([place])   . Seizures ([place])     Past Surgical History:  Procedure Laterality Date  . BREAST SURGERY    . MASTECTOMY Right 2014    There were no vitals filed for this visit.   Subjective Assessment - 09/17/20 1206    Subjective  Pt reports noticing her L long finger has started cramping up (MCP/PIP flexion pattern) more since this past weekend.    Patient is accompanied by: Family member   Daughter   Pertinent History Multiple sclerosis and chronic neuropathic pain, HTN, Stage 3 CKD, R breast cancer in 2014, seizure disorder    Patient Stated Goals Work on functional use of L hand, balance, and preventing contractures    Currently in Pain? Other (Comment)   Chronic neuropathic pain in BLEs            OT Treatments/Exercises (OP) - 09/17/20 1217      Wrist Exercises   Wrist Flexion --    Other wrist exercises AROM of wrist flexion/extension attempted; pt demo'd increased cramping of L fingers and activity was modified. Self-PROM with pt holding stretch at  end-range attempted; cramping of fingers continued and activity was d/c   AAROM rolling a ball forward and back used to facilitate improved wrist ROM; pt was able to complete activity 15x w/out difficulty or increased cramping of fingers     Hand Exercises   MCPJ Flexion AROM;Left;5 reps   Ataxia demo'd; pt experienced difficulty isolating MCPJ flexion with fingers adducted. OT provided v/c to slow movements down to facilitate full ROM   MCPJ Extension AROM;Left;10 reps   V/c required to slow pt down; OT facilitated gentle PROM of each finger, holding stretch at end-range. Increased tone of L long finger flexion   Opposition AROM;Left;10 reps   Thumb-to-finger opposition with each finger attempted; pt demo'd ataxic movement with all digits, but was able to successfully completed 10x with thumb-to-index finger. OT instructed pt to open hand fully between reps   Digit Abduction/Adduction L finger abduction/adduction completed with palm positioned on tabletop; decreased abduction of L ring finger demo'd   Exercise added to HEP            OT Education - 09/17/20 1205    Education Details OT reviewed all goals with pt, discussed potential orthosis, and initiated HEP designed for stretching/ROM.    Person(s) Educated Patient;Child(ren)   Daughter   Methods Explanation;Handout    Comprehension Verbalized understanding  OT Short Term Goals - 09/17/20 1212      OT SHORT TERM GOAL #1   Title Pt will verbalize understanding/return demonstration of initial HEP designed for strengthening of L hand/fingers    Baseline No current HEP    Time 4    Period Weeks    Status On-going    Target Date 10/10/20      OT SHORT TERM GOAL #2   Title Pt will be able to use compensatory strategies/assistive devices to manipulate clothing fasteners without assistance in 2/4 trials.    Baseline Requires assistance with clothing fasteners    Time 4    Period Weeks    Status On-going              OT Long Term Goals - 09/17/20 1214      OT LONG TERM GOAL #1   Title Pt will self-report compliance with wear and care of orthosis at least 90% of the time at home.    Baseline No orthosis for L hand contractures    Time 8    Period Weeks    Status On-going      OT LONG TERM GOAL #2   Title Pt will be Mod I with complete HEP designed for stretching and strengthening of L hand/fingers   May require cueing for compliance   Baseline No current HEP    Time 8    Period Weeks    Status On-going      OT LONG TERM GOAL #3   Title Pt will demonstrate Mod I with adaptive techniques/assistive devices for participation in UB/LB dressing.    Baseline Significant difficulty manipulating clothing fasteners    Time 8    Period Weeks    Status On-going             Plan - 09/17/20 1209    Clinical Impression Statement OT initiated gentle wrist/hand AROM and stretching this session; pt demo'd increased cramping of 2nd-5th digits in flexion pattern with repetition and activities were modified. Resting hand orthosis may be indicated. Potential benefit of AE/devices were discussed with pt and her daughter; pt was receptive.    OT Occupational Profile and History Detailed Assessment- Review of Records and additional review of physical, cognitive, psychosocial history related to current functional performance    Occupational performance deficits (Please refer to evaluation for details): ADL's;IADL's;Leisure    Body Structure / Function / Physical Skills ADL;UE functional use;Balance;Body mechanics;Flexibility;Pain;FMC;ROM;Coordination;GMC;Decreased knowledge of use of DME;IADL;Dexterity;Strength;Tone    Psychosocial Skills Environmental  Adaptations    Rehab Potential Good    Clinical Decision Making Several treatment options, min-mod task modification necessary    Comorbidities Affecting Occupational Performance: Presence of comorbidities impacting occupational performance    Modification or Assistance  to Complete Evaluation  Min-Moderate modification of tasks or assist with assess necessary to complete eval    OT Frequency 1x / week    OT Duration 8 weeks    OT Treatment/Interventions Self-care/ADL training;Ultrasound;Compression bandaging;DME and/or AE instruction;Patient/family education;Energy conservation;Passive range of motion;Cryotherapy;Splinting;Electrical Stimulation;Moist Heat;Therapeutic exercise;Manual Therapy;Therapeutic activities;Neuromuscular education    Plan Trial adaptive eqiupment/devices for functional FM tasks    Consulted and Agree with Plan of Care Patient           Patient will benefit from skilled therapeutic intervention in order to improve the following deficits and impairments:   Body Structure / Function / Physical Skills: ADL,UE functional use,Balance,Body mechanics,Flexibility,Pain,FMC,ROM,Coordination,GMC,Decreased knowledge of use of DME,IADL,Dexterity,Strength,Tone   Psychosocial Skills: Environmental  Adaptations  OCCUPATIONAL THERAPY DISCHARGE SUMMARY  Visits from Start of Care: 2  Current functional level related to goals / functional outcomes: Difficulty manipulating fasteners; decreased knowledge of AE and compensatory strategies   Remaining deficits: Decreased coordination, strength, and [facility]   Education / Equipment: Initiated HEP for gentle stretching of L hand/digits    Plan:                                                    Patient goals were not met.  Patient is being discharged due to not returning since the last visit.  ?????      Visit Diagnosis: Stiffness of left hand, not elsewhere classified  Other lack of coordination  Muscle weakness (generalized)  Other symptoms and signs involving the nervous system    Problem List Patient Active Problem List   Diagnosis Date Noted  . History of right breast cancer 03/13/2020  . Status post right mastectomy 03/13/2020  . Abnormal gait 09/15/2018  . Claustrophobia  09/15/2018  . Hypertension 09/15/2018  . Localization-related epilepsy ([place]) 09/15/2018  . Multiple sclerosis ([place]) 09/15/2018  . Paresthesia 09/15/2018  . Pain in lower limb 12/31/2015  . Seizure ([place]) 12/31/2015  . Neuropathic pain 06/02/2015  . No advance directives 11/02/2012  . Refusal of blood transfusion for reasons of conscience 11/02/2012  . Malignant neoplasm of upper-outer quadrant of right breast in female, estrogen receptor positive ([place]) 11/01/2012     Kathrine Cords, OTR/L, [place] 09/17/2020, 12:36 PM  [facility] [place], [place], [address] Phone: [phone]   Fax:  [phone]  Name: Selena Bender MRN: [id number] Date of Birth: [birth year]

## 2020-09-23 NOTE — Progress Notes (Unsigned)
NEUROLOGY FOLLOW UP OFFICE NOTE  Selena Bender [id number]   Subjective:  Selena Bender is a 65year oldright-handed black female with hypertension and history of breast cancerwho follows up for multiple sclerosis and seizure.  Accompanied by her daughter who supplements history.  UPDATE: Current disease modifying therapy:None Currentseizureprophylaxis: Lamotrigine 100mg  daily Current medications: Gabapentin 300mg  twice daily (fornerve pain, supposed to take 600mg  twice daily), lamotrigine 100mg  daily (for seizure prophylaxis), Percocet (fornerve pain), vitamin D4000IU daily  Underwent MRI of brain and cervical spine without contrast on 06/06/2020, personally reviewed.  MRI of brain showed stable (compared to 05/11/2019) MS lesions and chronic small vessel disease with remote lacunar infarcts in the bilateral cerebellar hemispheres and right caudate.  MRI of cervical spine showed stable (compared to 05/11/2019) multifocal MS cord lesions with similar multilevel degenerative changes causing similar mass effect on the left eccentric cord at C4-5 and C5-6 with severe bilateral foraminal stenosis.   Vision:OK.  Dysconjugate gaze. Motor: Left foot weakness. Sometimes her left hand cramps and closes, toes on left foot cramp. She reports that when she stands, her left knee "goes back", causing balance problems.  No pain Sensory: She has neuropathy involving the right leg. Pain: Burning pins and needles sensation along the lateral right leg from foot to hip. No back pain. Usually gabapentin helps.  She uses hydrocodone which is more effective.   Gait: Unsteady. Tendency to lean backwards. Left foot drags.Poor balance Bowel/Bladder: No issues. Fatigue: Yes but manageable. Cognition: Once in awhile she may have word-finding difficulty. Mood: Some depression from time to time. Not chronic.  HISTORY: Multiple Sclerosis: She had an initial flare up when  she was in her 57s.She had an episode of numbness from the waist down and difficulty with fine-finger movement, lasting 3-4 months.After several years, she then started dragging her left foot.She was formally diagnosed with multiple sclerosisin 2005 after MRI and CSF analysis.She was started on Copaxone but stopped about 2 years ago.She has not had any relapses but has had a gradual decline over the last several years, primary affecting her balance and gait.   Past disease modifying therapy:Copaxone (stopped in 2017- 2018 because she no longer had a neurologist)  Seizure Disorder: She also has a seizure disorder,possibly secondary to MS. She has had a total of 3 seizures. Last seizure was in the mid-90s.  Past anti-epileptic medications:Dilantin  Imaging: 05/11/2019 MRI BRAIN W WO: Advanced chronic demyelinating disease involving the cerebral white matter and cortex as as well as suspected superimposed chronic small vessel ischemia particularly involving the right caudate and cerebellum. 05/11/2019 MRI CERVICAL W WO: Chronic multifocal patchy demyelination involving the spinal cord and cervicomedullary junction, with severe involvement of left hemicord at C4 level. Degenerative cervical spine disease with moderate spinal stenosis causing mass effect on left hemicord at C4-5 with moderate to severe bilateral foraminal stenosis; lesser spinal stenosis at C6-7; moderate to severe bilateral foraminal stenosis at C6-7 and C7-T1.  PAST MEDICAL HISTORY: Past Medical History:  Diagnosis Date  . Breast cancer ([place]) 2014   Right  . Hypertension   . Multiple sclerosis ([place])   . Seizures ([place])     MEDICATIONS: Current Outpatient Medications on File Prior to Visit  Medication Sig Dispense Refill  . amLODipine (NORVASC) 10 MG tablet Take 1 tablet (10 mg total) by mouth daily. 90 tablet 3  . anastrozole (ARIMIDEX) 1 MG tablet Take 1 mg by mouth daily.    [place] Kitchen ascorbic acid  (VITAMIN C) 500 MG/5ML  syrup Take by mouth daily.    . cholecalciferol (VITAMIN D) 25 MCG (1000 UNIT) tablet Take 4 tablets (4,000 Units total) by mouth daily. 90 tablet 3  . diazepam (VALIUM) 5 MG tablet Take 1 tablet 40 to 60 minutes prior to MRI. (Patient not taking: Reported on 09/03/2020) 1 tablet 0  . fluticasone (FLONASE) 50 MCG/ACT nasal spray Place 1 spray into both nostrils at bedtime. 16 g 6  . gabapentin (NEURONTIN) 300 MG/6ML solution Take 8 mLs (400 mg total) by mouth 3 (three) times daily. 240 mL 12  . HYDROcodone-acetaminophen (NORCO/VICODIN) 5-325 MG tablet Take 1 tablet by mouth every 6 (six) hours as needed for moderate pain. 60 tablet 0  . lamoTRIgine (LAMICTAL) 100 MG tablet TAKE 1 TABLET BY MOUTH EVERY DAY 30 tablet 0  . loratadine (CLARITIN) 10 MG tablet Take 1 tablet (10 mg total) by mouth daily. 30 tablet 11  . losartan (COZAAR) 100 MG tablet Take 1 tablet (100 mg total) by mouth daily. 90 tablet 3   No current facility-administered medications on file prior to visit.    ALLERGIES: No Known Allergies  FAMILY HISTORY: Family History  Problem Relation Age of Onset  . Sickle cell anemia Father     SOCIAL HISTORY: Social History   Socioeconomic History  . Marital status: Widowed    Spouse name: Not on file  . Number of children: 2  . Years of education: Not on file  . Highest education level: Not on file  Occupational History  . Not on file  Tobacco Use  . Smoking status: Former Smoker    Types: Cigarettes  . Smokeless tobacco: Never Used  Vaping Use  . Vaping Use: Never used  Substance and Sexual Activity  . Alcohol use: Yes  . Drug use: Yes    Types: Marijuana    Comment: Gummies  . Sexual activity: Not Currently  Other Topics Concern  . Not on file  Social History Narrative  . Not on file   Social Determinants of Health   Financial Resource Strain: Not on file  Food Insecurity: Not on file  Transportation Needs: Not on file  Physical  Activity: Not on file  Stress: Not on file  Social Connections: Not on file  Intimate Partner Violence: Not on file     Objective:  Blood pressure 135/74, pulse 93, height 5\' 5"  (1.651 m), weight 141 lb 12.8 oz (64.3 kg), SpO2 96 %. General: No acute distress.  Patient appears well-groomed.   Head:  Normocephalic/atraumatic Eyes:  Fundi examined but not visualized Neck: supple, no paraspinal tenderness, full range of motion Heart:  Regular rate and rhythm Lungs:  Clear to auscultation bilaterally Back: No paraspinal tenderness Neurological Exam: alert and oriented to person, place, and time. Attention span and concentration intact, recent and remote memory intact, fund of knowledge intact.  Speech fluent and not dysarthric, language intact.  Left eye abducted on primary gaze, mildly reduced adduction of right eye.  Otherwise, CN II-XII intact. Bulk and tone increased on left, muscle strength 4+/5 left upper extremity, 2+/5 left hip flexion, left foot drop/exterally rotated, otherwise 5/5.  Sensation to pinprick reduced in left upper and lower extremities, sensation to vibration reduced in lower extremities (right worse than left).  Deep tendon reflexes 2+ throughout, left Babinski.  Finger to nose past pointing.  Left hemiplegic/spastic gait with externally rotated left foot.  .   Assessment/Plan:   1.  Multiple sclerosis 2.  Localization-related epilepsy, stable 3.  Cervical spinal stenosis.  There is some cord mass effect at C4-5 level.  Unclear how much, if any, is contributing to her balance, weakness vs the MS.  She is open to getting an opinion by neurosurgery. 4.  Hyperextension of left knee - causes instability on her feet which is a concern for her.  1.  Seizure prophylaxis: Lamotrigine 100mg  daily 2.  Pain management:  Gabapentin 300mg  BID 3.  D3 4000 IU daily 4.  Refer to neurosurgery regarding cervical spinal stenosis 5.  Refer to orthopedics regarding hyperextension of left  knee 6.  AFO for left foot drop 7.  Follow up one year.  Metta Clines, DO  CC: Huston Foley Just, FNP

## 2020-09-24 ENCOUNTER — Telehealth: Payer: Self-pay | Admitting: Family Medicine

## 2020-09-24 ENCOUNTER — Encounter: Payer: Self-pay | Admitting: Neurology

## 2020-09-24 ENCOUNTER — Other Ambulatory Visit: Payer: Self-pay

## 2020-09-24 ENCOUNTER — Ambulatory Visit (INDEPENDENT_AMBULATORY_CARE_PROVIDER_SITE_OTHER): Payer: Medicare Other | Admitting: Neurology

## 2020-09-24 VITALS — BP 135/74 | HR 93 | Ht 65.0 in | Wt 141.8 lb

## 2020-09-24 DIAGNOSIS — M792 Neuralgia and neuritis, unspecified: Secondary | ICD-10-CM

## 2020-09-24 DIAGNOSIS — S8982XA Other specified injuries of left lower leg, initial encounter: Secondary | ICD-10-CM

## 2020-09-24 DIAGNOSIS — G40109 Localization-related (focal) (partial) symptomatic epilepsy and epileptic syndromes with simple partial seizures, not intractable, without status epilepticus: Secondary | ICD-10-CM

## 2020-09-24 DIAGNOSIS — M21372 Foot drop, left foot: Secondary | ICD-10-CM

## 2020-09-24 DIAGNOSIS — M4802 Spinal stenosis, cervical region: Secondary | ICD-10-CM

## 2020-09-24 DIAGNOSIS — G35 Multiple sclerosis: Secondary | ICD-10-CM

## 2020-09-24 MED ORDER — LAMOTRIGINE 100 MG PO TABS: 100.0000 mg | ORAL_TABLET | Freq: Every day | ORAL | 5 refills | Status: DC

## 2020-09-24 MED ORDER — HYDROCODONE-ACETAMINOPHEN 5-325 MG PO TABS: 1.0000 | ORAL_TABLET | Freq: Four times a day (QID) | ORAL | 0 refills | Status: DC | PRN

## 2020-09-24 NOTE — Telephone Encounter (Signed)
Medication Refill - Medication: HYDROcodone-acetaminophen (NORCO/VICODIN) 5-325 MG tablet  Pt asked if this can be done today so she can pick up while at another Dr appt / please advise    Has the patient contacted their pharmacy? No. (Agent: If no, request that the patient contact the pharmacy for the refill.) (Agent: If yes, when and what did the pharmacy advise?)  Preferred Pharmacy (with phone number or street name): [id number] [place], [place]  [address] [place], [place] [address]  Phone:  [phone] Fax:  [phone]   Agent: Please be advised that RX refills may take up to 3 business days. We ask that you follow-up with your pharmacy.

## 2020-09-24 NOTE — Patient Instructions (Addendum)
1.  Will order AFO for left foot/ankle 2.  Will refer to orthopedics regarding left knee hyperextension/instability 3.  Refer to neurosurgery regarding cervical spinal stenosis 4.  Continue lamotrigine

## 2020-09-24 NOTE — Telephone Encounter (Signed)
Patient is requesting a refill of the following medications: Requested Prescriptions   Pending Prescriptions Disp Refills   HYDROcodone-acetaminophen (NORCO/VICODIN) 5-325 MG tablet 60 tablet 0    Sig: Take 1 tablet by mouth every 6 (six) hours as needed for moderate pain.    Date of patient request: 09/24/20 Last office visit: 09/03/20 Date of last refill: 07/28/20 Last refill amount: 60 + 0 Follow up time period per chart: 12/02/20

## 2020-09-24 NOTE — Telephone Encounter (Signed)
Requested medication (s) are due for refill today: yes  Requested medication (s) are on the active medication list:  yes  Last refill:  07/28/2020  Future visit scheduled: yes  Notes to clinic: this refill cannot be delegated  Patient would like to have this done today so she can pick up at appt    Requested Prescriptions  Pending Prescriptions Disp Refills   HYDROcodone-acetaminophen (NORCO/VICODIN) 5-325 MG tablet 60 tablet 0    Sig: Take 1 tablet by mouth every 6 (six) hours as needed for moderate pain.      There is no refill protocol information for this order

## 2020-09-25 ENCOUNTER — Ambulatory Visit: Payer: Medicare Other | Admitting: Occupational Therapy

## 2020-09-25 DIAGNOSIS — G959 Disease of spinal cord, unspecified: Secondary | ICD-10-CM | POA: Insufficient documentation

## 2020-09-26 ENCOUNTER — Other Ambulatory Visit: Payer: Self-pay | Admitting: Family Medicine

## 2020-09-26 DIAGNOSIS — R2681 Unsteadiness on feet: Secondary | ICD-10-CM

## 2020-10-01 ENCOUNTER — Encounter: Payer: Medicare Other | Admitting: Occupational Therapy

## 2020-10-16 ENCOUNTER — Ambulatory Visit: Payer: Medicare Other | Admitting: Orthopedic Surgery

## 2020-10-18 ENCOUNTER — Other Ambulatory Visit: Payer: Self-pay | Admitting: Family Medicine

## 2020-10-18 DIAGNOSIS — M792 Neuralgia and neuritis, unspecified: Secondary | ICD-10-CM

## 2020-10-18 NOTE — Telephone Encounter (Signed)
Insurance requires 90 day supply. Pharmacy requested prescription to be rewritten. Routing back to PCP.

## 2020-10-23 ENCOUNTER — Ambulatory Visit: Payer: Medicare Other

## 2020-11-04 ENCOUNTER — Other Ambulatory Visit: Payer: Self-pay

## 2020-11-04 ENCOUNTER — Ambulatory Visit: Payer: Medicare Other | Attending: Family Medicine | Admitting: Physical Therapy

## 2020-11-04 ENCOUNTER — Encounter: Payer: Self-pay | Admitting: Physical Therapy

## 2020-11-04 DIAGNOSIS — R2689 Other abnormalities of gait and mobility: Secondary | ICD-10-CM | POA: Insufficient documentation

## 2020-11-04 DIAGNOSIS — M6281 Muscle weakness (generalized): Secondary | ICD-10-CM | POA: Insufficient documentation

## 2020-11-04 DIAGNOSIS — R262 Difficulty in walking, not elsewhere classified: Secondary | ICD-10-CM | POA: Insufficient documentation

## 2020-11-04 NOTE — Therapy (Signed)
[place]. [place], [place], [address] Phone: [phone]   Fax:  [phone]  Physical Therapy Evaluation  Patient Details  Name: Selena Bender MRN: [id number] Date of Birth: [birth year] No data recorded  Encounter Date: 11/04/2020   PT End of Session - 11/04/20 1307    Visit Number 1    Date for PT Re-Evaluation 01/27/21    PT Start Time 1100    PT Stop Time 1141    PT Time Calculation (min) 41 min    Activity Tolerance Patient tolerated treatment well;Patient limited by fatigue    Behavior During Therapy [facility] for tasks assessed/performed           Past Medical History:  Diagnosis Date  . Breast cancer ([place]) 2014   Right  . Hypertension   . Multiple sclerosis ([place])   . Seizures ([place])     Past Surgical History:  Procedure Laterality Date  . BREAST SURGERY    . MASTECTOMY Right 2014    There were no vitals filed for this visit.    Subjective Assessment - 11/04/20 1059    Subjective Pt reports to clinic with symptoms of MS; has had dx since her 92s. Was diagnosed several years after first exacerbation. Pt ambulates into clinic with quad cane. Pt states she is increasingly having difficulties with steadiness and balance. Pt states she has had L foot drop since 2003 and that this is getting worse over the past year or so. Pt reports she has an AFO and brace but does not wear it because it hurts so much; states she has not worn them because they are so painful and uncomfortable. Pt reports nerve pain which she takes gabapentin for; states that she gets occasional knee pain. Her knee tends to hyperextend in stance and that is fairly painful.    Limitations Standing;Walking    Currently in Pain? Yes    Pain Score 5     Pain Location Leg   nerve pain   Pain Orientation Right;Left    Pain Descriptors / Indicators Burning;Pins and needles    Pain Type Chronic pain    Pain Onset More than a month ago     Pain Frequency Intermittent              [facility] PT Assessment - 11/04/20 0001      Assessment   Medical Diagnosis MS; unsteadiness/gait    Hand Dominance Right    Next MD Visit 12/02/20      Precautions   Precautions None      Restrictions   Weight Bearing Restrictions No      Balance Screen   Has the patient fallen in the past 6 months Yes    How many times? 1    Has the patient had a decrease in activity level because of a fear of falling?  No    Is the patient reluctant to leave their home because of a fear of falling?  No      Home Environment   Additional Comments no stairs at home      Prior Function   Level of Independence Requires assistive device for independence;Needs assistance with homemaking    Vocation Retired    [facility] in ED admission    Leisure going out to eat; puzzles on phone      Sensation   Light Touch Appears Intact      Functional Tests  Functional tests Sit to Stand      Sit to Stand   Comments difficulty with eccentric control, locking legs on back of chair. needs use of cane                      Objective measurements completed on examination: See above findings.               PT Education - 11/04/20 1306    Education Details Pt educated on POC and HEP    Person(s) Educated Patient;Child(ren)    Methods Explanation;Demonstration;Handout    Comprehension Verbalized understanding;Returned demonstration            PT Short Term Goals - 11/04/20 1336      PT SHORT TERM GOAL #1   Title Pt will be I with initial HEP    Time 2    Period Weeks    Status New    Target Date 11/18/20             PT Long Term Goals - 11/04/20 1336      PT LONG TERM GOAL #1   Title Pt will demo gait x100 ft with LRAD and modI and BLE foot clearance    Time 8    Period Weeks    Status New    Target Date 12/30/20      PT LONG TERM GOAL #2   Title Pt will demo L hip flexion strength improved  by 1 grade on MMT    Time 8    Period Weeks    Status New    Target Date 12/30/20      PT LONG TERM GOAL #3   Title Pt will report no additional falls    Time 8    Period Weeks    Status New    Target Date 12/30/20      PT LONG TERM GOAL #4   Title Pt will demo TUG with LRAD <16 sec    Time 8    Period Weeks    Status New    Target Date 12/30/20                  Plan - 11/04/20 1307    Clinical Impression Statement Pt presents to clinic with dx of MS. Pt has been experiencing L foot drop since 2003 but pt and daughter noted gait/mobility issues have been worsening over the past few years. Pt ambulates into clinic with SPC, swayback posture, BLE foot clearance deficits L>R, LLE externally rotated, shortened step length B L>R, and LOB with transitional movements able to self-correct. Pt demos signifcant BLE strength deficits L>R with hip flexion/abd/ext and L ankle DF most limited. Pt has several AFOs but does not wear them d/t discomfort/pain. Pt will bring these next rx to try in clinic. Pt educated on the need for some type of bracing to decrease risk for falls. Pt is moving over the next few weeks into a one level apartment and will return to clinic for first follow up after her move. Pt would benefit from skilled PT to help inrease independent mobility, decrease fall risk, and improve quality of life.    Personal Factors and Comorbidities Comorbidity 3+    Comorbidities HTN, MS, seizures (reports it has been years since last seizure)    Examination-Activity Limitations Locomotion Level;Transfers;Stand;Stairs    Examination-Participation Restrictions Community Activity;Interpersonal Relationship    Stability/Clinical Decision Making Evolving/Moderate complexity    Clinical Decision Making  Moderate    Rehab Potential Good    PT Frequency 2x / week    PT Duration 8 weeks    PT Treatment/Interventions ADLs/Self Care Home Management;Electrical Stimulation;Neuromuscular  re-education;Balance training;Therapeutic exercise;Therapeutic activities;Functional mobility training;Gait training;Patient/family education;Manual techniques;Passive range of motion    PT Next Visit Plan TUG, review HEP, LE strengthening with focus on LLE, gait training, balance ex's, gait with AFOs (if she brings them)    PT Home Exercise Plan see pt instructions    Consulted and Agree with Plan of Care Patient;Family member/caregiver    Family Member Consulted Daughter Darden Dates)           Patient will benefit from skilled therapeutic intervention in order to improve the following deficits and impairments:  Abnormal gait,Decreased range of motion,Difficulty walking,Decreased endurance,Decreased activity tolerance,Pain,Decreased balance,Impaired flexibility,Decreased mobility,Decreased strength,Postural dysfunction  Visit Diagnosis: Difficulty in walking, not elsewhere classified  Muscle weakness (generalized)  Other abnormalities of gait and mobility     Problem List Patient Active Problem List   Diagnosis Date Noted  . History of right breast cancer 03/13/2020  . Status post right mastectomy 03/13/2020  . Abnormal gait 09/15/2018  . Claustrophobia 09/15/2018  . Hypertension 09/15/2018  . Localization-related epilepsy ([place]) 09/15/2018  . Multiple sclerosis ([place]) 09/15/2018  . Paresthesia 09/15/2018  . Pain in lower limb 12/31/2015  . Seizure ([place]) 12/31/2015  . Neuropathic pain 06/02/2015  . No advance directives 11/02/2012  . Refusal of blood transfusion for reasons of conscience 11/02/2012  . Malignant neoplasm of upper-outer quadrant of right breast in female, estrogen receptor positive ([place]) 11/01/2012   Amador Cunas, PT, DPT Donald Prose S[facility] [place] 11/04/2020, 1:46 PM  [place]. [place], [place], [address] Phone: [phone]   Fax:  [phone]  Name: Selena Bender MRN: [id number] Date of Birth:  November 01, 1950

## 2020-11-04 NOTE — Patient Instructions (Signed)
Access Code: [facility] URL: [email or web address] Date: 11/04/2020 Prepared by: Amador Cunas  Exercises Seated Heel Slide - 1 x daily - 7 x weekly - 3 sets - 10 reps Seated March - 1 x daily - 7 x weekly - 3 sets - 10 reps - 1-2 sec hold Seated Long Arc Quad - 1 x daily - 7 x weekly - 3 sets - 10 reps - 1-2 sec hold Seated Heel Raise - 1 x daily - 7 x weekly - 3 sets - 10 reps Seated Toe Raise - 1 x daily - 7 x weekly - 3 sets - 10 reps

## 2020-11-18 ENCOUNTER — Ambulatory Visit: Payer: Medicare Other | Admitting: Physical Therapy

## 2020-12-02 ENCOUNTER — Ambulatory Visit: Payer: Medicare Other | Admitting: Family Medicine

## 2020-12-05 ENCOUNTER — Ambulatory Visit: Payer: Medicare Other | Attending: Family Medicine | Admitting: Physical Therapy

## 2020-12-05 ENCOUNTER — Other Ambulatory Visit: Payer: Self-pay

## 2020-12-05 DIAGNOSIS — R2689 Other abnormalities of gait and mobility: Secondary | ICD-10-CM | POA: Diagnosis not present

## 2020-12-05 DIAGNOSIS — R262 Difficulty in walking, not elsewhere classified: Secondary | ICD-10-CM | POA: Insufficient documentation

## 2020-12-05 DIAGNOSIS — R278 Other lack of coordination: Secondary | ICD-10-CM | POA: Diagnosis not present

## 2020-12-05 DIAGNOSIS — R29818 Other symptoms and signs involving the nervous system: Secondary | ICD-10-CM | POA: Insufficient documentation

## 2020-12-05 DIAGNOSIS — M6281 Muscle weakness (generalized): Secondary | ICD-10-CM | POA: Insufficient documentation

## 2020-12-05 DIAGNOSIS — M25642 Stiffness of left hand, not elsewhere classified: Secondary | ICD-10-CM | POA: Diagnosis not present

## 2020-12-05 NOTE — Therapy (Signed)
[place]. [place], [place], [address] Phone: [phone]   Fax:  [phone]  Physical Therapy Treatment  Patient Details  Name: Selena Bender MRN: [id number] Date of Birth: [birth year] No data recorded  Encounter Date: 12/05/2020   PT End of Session - 12/05/20 1126    Visit Number 2    Date for PT Re-Evaluation 01/27/21    PT Start Time 9983    PT Stop Time 1055    PT Time Calculation (min) 40 min           Past Medical History:  Diagnosis Date  . Breast cancer ([place]) 2014   Right  . Hypertension   . Multiple sclerosis ([place])   . Seizures ([place])     Past Surgical History:  Procedure Laterality Date  . BREAST SURGERY    . MASTECTOMY Right 2014    There were no vitals filed for this visit.   Subjective Assessment - 12/05/20 1019    Subjective Pt reports she is having her normal nerve pain today, but it worse today.    Currently in Pain? Yes    Pain Score 7     Pain Location Leg    Pain Orientation Left;Right                             [facility] Adult PT Treatment/Exercise - 12/05/20 0001      Ambulation/Gait   Ambulation/Gait Yes    Ambulation/Gait Assistance 4: Min guard    Ambulation Distance (Feet) 75 Feet    Assistive device Straight cane    Gait Pattern Step-to pattern    Ambulation Surface Indoor    Gait Comments max cues needed for cane sequencing, LLE weight bearing, LLE clearance. sway back posture with lateral postural sway noted during amb.      Exercises   Exercises Knee/Hip      Knee/Hip Exercises: Aerobic   Nustep L4 x 56mins      Knee/Hip Exercises: Seated   Long Arc Quad Strengthening;Both;2 sets;10 reps    Ball Squeeze 2x10    Clamshell with TheraBand Yellow   2x10   Other Seated Knee/Hip Exercises rows 2x10    Marching Strengthening;2 sets;20 reps   limited ROM on L                   PT Short Term Goals - 11/04/20 1336      PT  SHORT TERM GOAL #1   Title Pt will be I with initial HEP    Time 2    Period Weeks    Status New    Target Date 11/18/20             PT Long Term Goals - 11/04/20 1336      PT LONG TERM GOAL #1   Title Pt will demo gait x100 ft with LRAD and modI and BLE foot clearance    Time 8    Period Weeks    Status New    Target Date 12/30/20      PT LONG TERM GOAL #2   Title Pt will demo L hip flexion strength improved by 1 grade on MMT    Time 8    Period Weeks    Status New    Target Date 12/30/20      PT LONG TERM GOAL #3   Title Pt will report no additional falls  Time 8    Period Weeks    Status New    Target Date 12/30/20      PT LONG TERM GOAL #4   Title Pt will demo TUG with LRAD <16 sec    Time 8    Period Weeks    Status New    Target Date 12/30/20                 Plan - 12/05/20 1112    Clinical Impression Statement Pt tolerated initial PT session well. She stated at beginning of session she had been doing all of her HEP exercises but when prompted to demo she stated she had only been doing heel slides. Educated pt on importance of doing all exercises, and worked through exercises of HEP. Pt did not bring AFOs this session, stated they were still at her old house and she would try to remember next time. Decreased L hip strength noted with full ROM of LAQ and very limited ROM during seated marching and clamshell with band. Adjusted pt's cane to proper height at beginning of session before amb practice, unsteady gait with c/o of fear and discomfort with new cane height noted. Cane adjusted back to improper but comfortable height at end of session per pt request. Educated pt on reasoning for proper cane height.    PT Treatment/Interventions ADLs/Self Care Home Management;Electrical Stimulation;Neuromuscular re-education;Balance training;Therapeutic exercise;Therapeutic activities;Functional mobility training;Gait training;Patient/family education;Manual  techniques;Passive range of motion    PT Next Visit Plan TUG, continue LE strengthening with focus on LLE, gait training, balance ex's, gait with AFOs (if she brings them)    Family Member Consulted Daughter Darden Dates)           Patient will benefit from skilled therapeutic intervention in order to improve the following deficits and impairments:  Abnormal gait,Decreased range of motion,Difficulty walking,Decreased endurance,Decreased activity tolerance,Pain,Decreased balance,Impaired flexibility,Decreased mobility,Decreased strength,Postural dysfunction  Visit Diagnosis: Difficulty in walking, not elsewhere classified  Muscle weakness (generalized)  Other abnormalities of gait and mobility  Stiffness of left hand, not elsewhere classified  Other lack of coordination  Other symptoms and signs involving the nervous system     Problem List Patient Active Problem List   Diagnosis Date Noted  . History of right breast cancer 03/13/2020  . Status post right mastectomy 03/13/2020  . Abnormal gait 09/15/2018  . Claustrophobia 09/15/2018  . Hypertension 09/15/2018  . Localization-related epilepsy ([place]) 09/15/2018  . Multiple sclerosis ([place]) 09/15/2018  . Paresthesia 09/15/2018  . Pain in lower limb 12/31/2015  . Seizure ([place]) 12/31/2015  . Neuropathic pain 06/02/2015  . No advance directives 11/02/2012  . Refusal of blood transfusion for reasons of conscience 11/02/2012  . Malignant neoplasm of upper-outer quadrant of right breast in female, estrogen receptor positive ([place]) 11/01/2012    Ernst Spell, [address] 12/05/2020, 11:27 AM  [place]. [place], [place], [address] Phone: [phone]   Fax:  [phone]  Name: Sharine Cadle MRN: [id number] Date of Birth: Oct 10, 1950

## 2020-12-07 DIAGNOSIS — U071 COVID-19: Secondary | ICD-10-CM

## 2020-12-07 HISTORY — DX: COVID-19: U07.1

## 2020-12-08 ENCOUNTER — Other Ambulatory Visit: Payer: Self-pay

## 2020-12-08 ENCOUNTER — Ambulatory Visit: Payer: Medicare Other | Attending: Family Medicine | Admitting: Physical Therapy

## 2020-12-08 DIAGNOSIS — R262 Difficulty in walking, not elsewhere classified: Secondary | ICD-10-CM | POA: Diagnosis not present

## 2020-12-08 DIAGNOSIS — M25642 Stiffness of left hand, not elsewhere classified: Secondary | ICD-10-CM | POA: Diagnosis not present

## 2020-12-08 DIAGNOSIS — R278 Other lack of coordination: Secondary | ICD-10-CM | POA: Insufficient documentation

## 2020-12-08 DIAGNOSIS — M6281 Muscle weakness (generalized): Secondary | ICD-10-CM | POA: Diagnosis not present

## 2020-12-08 DIAGNOSIS — R2689 Other abnormalities of gait and mobility: Secondary | ICD-10-CM | POA: Diagnosis not present

## 2020-12-08 DIAGNOSIS — R29818 Other symptoms and signs involving the nervous system: Secondary | ICD-10-CM | POA: Insufficient documentation

## 2020-12-08 NOTE — Therapy (Signed)
[place]. [place], [place], [address] Phone: [phone]   Fax:  [phone]  Physical Therapy Treatment  Patient Details  Name: Selena Bender MRN: [id number] Date of Birth: 1950-09-03 No data recorded  Encounter Date: 12/08/2020   PT End of Session - 12/08/20 1309    Visit Number 3    Date for PT Re-Evaluation 01/27/21    PT Start Time 8101    PT Stop Time 1344    PT Time Calculation (min) 39 min    Activity Tolerance Patient tolerated treatment well;Patient limited by fatigue    Behavior During Therapy [facility] for tasks assessed/performed           Past Medical History:  Diagnosis Date  . Breast cancer ([place]) 2014   Right  . Hypertension   . Multiple sclerosis ([place])   . Seizures ([place])     Past Surgical History:  Procedure Laterality Date  . BREAST SURGERY    . MASTECTOMY Right 2014    There were no vitals filed for this visit.   Subjective Assessment - 12/08/20 1309    Subjective Pt was ~5 mins late. C/o of her usual nerve pain.    Patient is accompained by: Family member    Pain Score 6     Pain Location Leg    Pain Orientation Right;Left                             [facility] Adult PT Treatment/Exercise - 12/08/20 0001      Knee/Hip Exercises: Aerobic   Nustep L4 x 27mins      Knee/Hip Exercises: Standing   Hip Abduction Both;2 sets;10 reps   1 set 2#   Hip Extension Both;2 sets;10 reps   2#   Other Standing Knee Exercises marching 2x20    Other Standing Knee Exercises shoulder ext/rows 2x10 ea red tband      Knee/Hip Exercises: Seated   Long Arc Quad Strengthening;Both;2 sets;10 reps    Long Arc Quad Weight 3 lbs.    Other Seated Knee/Hip Exercises L hip internal rotation x 10    Sit to Sand 2 sets;15 reps;without UE support   from elevated mat table                   PT Short Term Goals - 11/04/20 1336      PT SHORT TERM GOAL #1   Title Pt will be I  with initial HEP    Time 2    Period Weeks    Status New    Target Date 11/18/20             PT Long Term Goals - 11/04/20 1336      PT LONG TERM GOAL #1   Title Pt will demo gait x100 ft with LRAD and modI and BLE foot clearance    Time 8    Period Weeks    Status New    Target Date 12/30/20      PT LONG TERM GOAL #2   Title Pt will demo L hip flexion strength improved by 1 grade on MMT    Time 8    Period Weeks    Status New    Target Date 12/30/20      PT LONG TERM GOAL #3   Title Pt will report no additional falls    Time 8  Period Weeks    Status New    Target Date 12/30/20      PT LONG TERM GOAL #4   Title Pt will demo TUG with LRAD <16 sec    Time 8    Period Weeks    Status New    Target Date 12/30/20                 Plan - 12/08/20 1347    Clinical Impression Statement Pt continued to tolerate progression of TE well today. She was able to compelete all standing ther ex and progress to ankle weights during all standing and seated ther ex. Limited ROM noted during satanding marching on LLE. Cues needed during standing UE ther ex to not avoid bracing backs of legs onmat table. Cues needed during STS to keep foot even. L hip internal rotator weakness noted with out-turned L toe during amb. Pt again amb into clinic with cane adjusted too tall, adjusted to proper height for session, but pt requested cane to be adjusted back to her prefered height before she left.    PT Treatment/Interventions ADLs/Self Care Home Management;Electrical Stimulation;Neuromuscular re-education;Balance training;Therapeutic exercise;Therapeutic activities;Functional mobility training;Gait training;Patient/family education;Manual techniques;Passive range of motion    PT Next Visit Plan TUG, continue LE strengthening with focus on LLE, gait training, balance ex's, gait with AFOs (if she brings them)           Patient will benefit from skilled therapeutic intervention in order to  improve the following deficits and impairments:  Abnormal gait,Decreased range of motion,Difficulty walking,Decreased endurance,Decreased activity tolerance,Pain,Decreased balance,Impaired flexibility,Decreased mobility,Decreased strength,Postural dysfunction  Visit Diagnosis: Difficulty in walking, not elsewhere classified  Muscle weakness (generalized)  Other abnormalities of gait and mobility  Stiffness of left hand, not elsewhere classified  Other lack of coordination  Other symptoms and signs involving the nervous system     Problem List Patient Active Problem List   Diagnosis Date Noted  . History of right breast cancer 03/13/2020  . Status post right mastectomy 03/13/2020  . Abnormal gait 09/15/2018  . Claustrophobia 09/15/2018  . Hypertension 09/15/2018  . Localization-related epilepsy ([place]) 09/15/2018  . Multiple sclerosis ([place]) 09/15/2018  . Paresthesia 09/15/2018  . Pain in lower limb 12/31/2015  . Seizure ([place]) 12/31/2015  . Neuropathic pain 06/02/2015  . No advance directives 11/02/2012  . Refusal of blood transfusion for reasons of conscience 11/02/2012  . Malignant neoplasm of upper-outer quadrant of right breast in female, estrogen receptor positive ([place]) 11/01/2012    Ernst Spell, [place] 12/08/2020, 1:54 PM  [place]. [place], [place], [address] Phone: [phone]   Fax:  [phone]  Name: Selena Bender MRN: [id number] Date of Birth: Jan 21, 1951

## 2020-12-16 ENCOUNTER — Ambulatory Visit: Payer: Medicare Other | Admitting: Physical Therapy

## 2020-12-18 ENCOUNTER — Encounter (HOSPITAL_COMMUNITY): Payer: Self-pay

## 2020-12-18 ENCOUNTER — Emergency Department (HOSPITAL_COMMUNITY)
Admission: EM | Admit: 2020-12-18 | Discharge: 2020-12-18 | Disposition: A | Discharge status: Home / Self Care | Payer: Medicare Other | Attending: Emergency Medicine | Admitting: Emergency Medicine

## 2020-12-18 ENCOUNTER — Ambulatory Visit: Payer: Medicare Other | Admitting: Physical Therapy

## 2020-12-18 ENCOUNTER — Emergency Department (HOSPITAL_COMMUNITY): Payer: Medicare Other

## 2020-12-18 ENCOUNTER — Other Ambulatory Visit: Payer: Self-pay

## 2020-12-18 DIAGNOSIS — J069 Acute upper respiratory infection, unspecified: Secondary | ICD-10-CM | POA: Diagnosis not present

## 2020-12-18 DIAGNOSIS — I517 Cardiomegaly: Secondary | ICD-10-CM | POA: Diagnosis not present

## 2020-12-18 DIAGNOSIS — I10 Essential (primary) hypertension: Secondary | ICD-10-CM | POA: Insufficient documentation

## 2020-12-18 DIAGNOSIS — Z87891 Personal history of nicotine dependence: Secondary | ICD-10-CM | POA: Diagnosis not present

## 2020-12-18 DIAGNOSIS — Z853 Personal history of malignant neoplasm of breast: Secondary | ICD-10-CM | POA: Diagnosis not present

## 2020-12-18 DIAGNOSIS — U071 COVID-19: Secondary | ICD-10-CM | POA: Insufficient documentation

## 2020-12-18 DIAGNOSIS — R059 Cough, unspecified: Secondary | ICD-10-CM | POA: Diagnosis not present

## 2020-12-18 DIAGNOSIS — Z79899 Other long term (current) drug therapy: Secondary | ICD-10-CM | POA: Insufficient documentation

## 2020-12-18 LAB — RESP PANEL BY RT-PCR (FLU A&B, COVID) ARPGX2
Influenza A by PCR: NEGATIVE
Influenza B by PCR: NEGATIVE
SARS Coronavirus 2 by RT PCR: POSITIVE — AB

## 2020-12-18 NOTE — ED Triage Notes (Signed)
Pt presents with c/o cough and headache that started last night. Pt denies any Covid exposures. Pt also c/o generalized body aches.

## 2020-12-18 NOTE — Discharge Instructions (Addendum)
You were seen in the emergency room today for your body aches and congestion.  Your physical exam and vital signs were very reassuring.  You have been tested for COVID-19 and influenza A/B.  You may follow-up your test results in the MyChart app.  Please follow close with your primary care doctor, increase your hydration, plenty of rest.  Return to the emergency department if you develop any worsening shortness of breath, chest pain, nausea or vomiting that does not stop, or any other new severe symptoms.

## 2020-12-18 NOTE — ED Notes (Signed)
Pt left prior to receiving discharge paperwork  

## 2020-12-18 NOTE — ED Provider Notes (Signed)
[facility] DEPT Provider Note   CSN: [id number] Arrival date & time: 12/18/20  1248     History Chief Complaint  Patient presents with  . Cough  . Headache    Selena Bender is a 70 y.o. female with history of multiple sclerosis and right breast cancer who presents with concern for 24 hours of cough, headache, body aches, chills, sore throat, and nasal congestion.  She has been vaccinated as COVID-19 and influenza.  Cough is nonproductive.  She denies any nausea, vomiting, diarrhea, urinary symptoms.  Denies any difficulty swallowing or pain when she swallows.  I personally reviewed this patient's medical records.  She is not on any anticoagulation.   HPI     Past Medical History:  Diagnosis Date  . Breast cancer ([place]) 2014   Right  . Hypertension   . Multiple sclerosis ([place])   . Seizures [facility])     Patient Active Problem List   Diagnosis Date Noted  . History of right breast cancer 03/13/2020  . Status post right mastectomy 03/13/2020  . Abnormal gait 09/15/2018  . Claustrophobia 09/15/2018  . Hypertension 09/15/2018  . Localization-related epilepsy ([place]) 09/15/2018  . Multiple sclerosis ([place]) 09/15/2018  . Paresthesia 09/15/2018  . Pain in lower limb 12/31/2015  . Seizure ([place]) 12/31/2015  . Neuropathic pain 06/02/2015  . No advance directives 11/02/2012  . Refusal of blood transfusion for reasons of conscience 11/02/2012  . Malignant neoplasm of upper-outer quadrant of right breast in female, estrogen receptor positive ([place]) 11/01/2012    Past Surgical History:  Procedure Laterality Date  . BREAST SURGERY    . MASTECTOMY Right 2014     OB History   No obstetric history on file.     Family History  Problem Relation Age of Onset  . Sickle cell anemia Father     Social History   Tobacco Use  . Smoking status: Former Smoker    Types: Cigarettes  . Smokeless tobacco: Never Used  Vaping Use  . Vaping Use: Never  used  Substance Use Topics  . Alcohol use: Yes  . Drug use: Yes    Types: Marijuana    Comment: Gummies    Home Medications Prior to Admission medications   Medication Sig Start Date End Date Taking? Authorizing Provider  gabapentin (NEURONTIN) 250 MG/5ML solution TAKE 8 MLS BY MOUTH 3 TIMES DAILY. 10/20/20   Just, Laurita Quint, FNP  amLODipine (NORVASC) 10 MG tablet Take 1 tablet (10 mg total) by mouth daily. 09/03/20   Just, Laurita Quint, FNP  anastrozole (ARIMIDEX) 1 MG tablet Take 1 mg by mouth daily. 09/13/19   [provider]  ascorbic acid (VITAMIN C) 500 MG/5ML syrup Take by mouth daily.    [provider]  cholecalciferol (VITAMIN D) 25 MCG (1000 UNIT) tablet Take 4 tablets (4,000 Units total) by mouth daily. 06/02/20   Just, Laurita Quint, FNP  diazepam (VALIUM) 5 MG tablet Take 1 tablet 40 to 60 minutes prior to MRI. 04/22/20   Tomi Likens, Adam R, DO  fluticasone (FLONASE) 50 MCG/ACT nasal spray Place 1 spray into both nostrils at bedtime. 11/30/19   Daleen Squibb, MD  HYDROcodone-acetaminophen (NORCO/VICODIN) 5-325 MG tablet Take 1 tablet by mouth every 6 (six) hours as needed for moderate pain. 09/24/20   Just, Laurita Quint, FNP  lamoTRIgine (LAMICTAL) 100 MG tablet Take 1 tablet (100 mg total) by mouth daily. 09/24/20   Pieter Partridge, DO  loratadine (CLARITIN) 10  MG tablet Take 1 tablet (10 mg total) by mouth daily. 05/31/19   Jacelyn Pi, Lilia Argue, MD  losartan (COZAAR) 100 MG tablet Take 1 tablet (100 mg total) by mouth daily. 11/30/19   Daleen Squibb, MD    Allergies    Patient has no known allergies.  Review of Systems   Review of Systems  Constitutional: Positive for activity change, appetite change, chills and fatigue. Negative for diaphoresis and fever.  HENT: Positive for congestion, rhinorrhea, sinus pressure and sore throat. Negative for sinus pain, sneezing, trouble swallowing and voice change.   Eyes: Negative.   Respiratory: Positive for cough and chest  tightness. Negative for shortness of breath, wheezing and stridor.   Cardiovascular: Negative.  Negative for chest pain, palpitations and leg swelling.  Gastrointestinal: Negative.   Genitourinary: Negative.   Musculoskeletal: Positive for myalgias. Negative for arthralgias, back pain, neck pain and neck stiffness.  Skin: Negative.   Neurological: Positive for headaches. Negative for dizziness, tremors, weakness and light-headedness.    Physical Exam Updated Vital Signs BP 105/63   Pulse 68   Temp 98.7 F (37.1 C) (Oral)   Resp 18   SpO2 98%   Physical Exam Vitals and nursing note reviewed.  Constitutional:      Appearance: She is normal weight. She is not ill-appearing or toxic-appearing.  HENT:     Head: Normocephalic and atraumatic.     Nose: Nose normal.     Mouth/Throat:     Mouth: Mucous membranes are moist.     Pharynx: Oropharynx is clear. Uvula midline. Posterior oropharyngeal erythema present. No pharyngeal swelling or oropharyngeal exudate.     Tonsils: No tonsillar exudate.  Eyes:     General: Lids are normal. Vision grossly intact.        Right eye: No discharge.        Left eye: No discharge.     Extraocular Movements: Extraocular movements intact.     Conjunctiva/sclera: Conjunctivae normal.     Pupils: Pupils are equal, round, and reactive to light.  Neck:     Trachea: Trachea and phonation normal.  Cardiovascular:     Rate and Rhythm: Normal rate and regular rhythm.     Pulses: Normal pulses.     Heart sounds: Normal heart sounds. No murmur heard.   Pulmonary:     Effort: Pulmonary effort is normal. No tachypnea, bradypnea, accessory muscle usage, prolonged expiration or respiratory distress.     Breath sounds: Normal breath sounds. No wheezing or rales.  Chest:     Chest wall: No mass, lacerations, deformity, swelling, tenderness, crepitus or edema.  Abdominal:     General: Bowel sounds are normal. There is no distension.     Palpations: Abdomen is  soft.     Tenderness: There is no abdominal tenderness. There is no right CVA tenderness, left CVA tenderness, guarding or rebound.  Musculoskeletal:        General: No deformity.     Cervical back: Normal range of motion and neck supple. No rigidity or crepitus. No pain with movement or spinous process tenderness.     Right lower leg: No edema.     Left lower leg: No edema.  Lymphadenopathy:     Cervical: No cervical adenopathy.  Skin:    General: Skin is warm and dry.  Neurological:     Mental Status: She is alert and oriented to person, place, and time. Mental status is at baseline.  Sensory: Sensation is intact.     Motor: Motor function is intact.     Gait: Gait is intact.  Psychiatric:        Mood and Affect: Mood normal.     ED Results / Procedures / Treatments   Labs (all labs ordered are listed, but only abnormal results are displayed) Labs Reviewed  RESP PANEL BY RT-PCR (FLU A&B, COVID) [id number]    EKG None  Radiology DG Chest 2 View  Result Date: 12/18/2020 CLINICAL DATA:  Cough EXAM: CHEST - 2 VIEW COMPARISON:  None. FINDINGS: The heart is enlarged. Vascular calcifications are seen in the aortic arch. The lungs are clear. Degenerative changes are seen in the spine. IMPRESSION: Cardiomegaly.  No active cardiopulmonary disease. Aortic Atherosclerosis (ICD10-I70.0). Electronically Signed   By: Zerita Boers M.D.   On: 12/18/2020 14:21    Procedures Procedures   Medications Ordered in ED Medications - No data to display  ED Course  I have reviewed the triage vital signs and the nursing notes.  Pertinent labs & imaging results that were available during my care of the patient were reviewed by me and considered in my medical decision making (see chart for details).    MDM Rules/Calculators/A&P                         70 year old female presents with concern for myalgias, chills, cough, headache, sore throat x24 hours.  Differential diagnosis includes  resolving to COVID-19, influenza A/B, other viral upper respiratory infection, pharyngitis, sepsis, pneumonia, pleural effusion.  Vital signs are normal intake.  Cardiopulmonary exam is normal, abdominal exam is benign.  HEENT exam revealed mild posterior pharyngeal erythema without exudate or sign of oropharyngeal abscess.  There are no rashes.  Will proceed with evaluation orthostatic vital signs given borderline hypotension, monitor.  Chest x-ray obtained in triage negative for acute cardiopulmonary disease.  Will also proceed with COVID-19 and influenza A/B test.  No evidence of orthostasis on examination orthostatic vital signs.  Patient safe to be discharged home given reassuring physical exam, vital signs, and chest x-ray.  She may follow-up on her COVID-19 test in her MyChart app.  Recommend close follow-up with her primary care doctor.  Utilize OTC analgesia as needed for symptoms.  Jema voiced understanding of her medical evaluation and treatment plan.  Each of her questions was answered to her expressed satisfaction.  Return precautions were given.  Patient is well-appearing, stable, and appropriate for discharge at this time.  This chart was dictated using voice recognition software, Dragon. Despite the best efforts of this provider to proofread and correct errors, errors may still occur which can change documentation meaning.  Final Clinical Impression(s) / ED Diagnoses Final diagnoses:  Upper respiratory tract infection, unspecified type    Rx / DC Orders ED Discharge Orders    None       Aura Dials 12/18/20 1725    Carmin Muskrat, MD 12/18/20 2242

## 2020-12-18 NOTE — ED Provider Notes (Addendum)
Emergency Medicine Provider Triage Evaluation Note  Selena Bender , a 70 y.o. female  was evaluated in triage.  Pt complains of headache, myalgias, cough.  Cough is non productive.   Patient denies any known sick contacts.  Patient was vaccinated for influenza.  Patient was vaccinated for COVID-19.  Patient has not had booster for COVID-19.   Review of Systems  Positive: Chills, headache, myalgias, cough, rhinorrhea, Negative: Fever, drooling, trouble swallowing, hot hot potatoe voice, abd pain, nausea, vomiting  Physical Exam  BP 106/63 (BP Location: Left Arm)   Pulse 84   Temp 98.7 F (37.1 C) (Oral)   Resp 17   SpO2 98%  Gen:   Awake, no distress   Resp:  Normal effort, lungs clear to auscultation bilaterally MSK:   Moves extremities without difficulty  Other:    Medical Decision Making  Medically screening exam initiated at 1:24 PM.  Appropriate orders placed.  Selena Bender was informed that the remainder of the evaluation will be completed by another provider, this initial triage assessment does not replace that evaluation, and the importance of remaining in the ED until their evaluation is complete.  The patient appears stable so that the remainder of the work up may be completed by another provider.      Loni Beckwith, PA-C 12/18/20 1329    Loni Beckwith, PA-C 12/18/20 1329    Carmin Muskrat, MD 12/18/20 2242

## 2020-12-19 ENCOUNTER — Telehealth: Payer: Self-pay

## 2020-12-19 ENCOUNTER — Encounter: Payer: Self-pay | Admitting: Nurse Practitioner

## 2020-12-19 ENCOUNTER — Telehealth: Payer: Self-pay | Admitting: Nurse Practitioner

## 2020-12-19 ENCOUNTER — Other Ambulatory Visit: Payer: Self-pay | Admitting: Nurse Practitioner

## 2020-12-19 DIAGNOSIS — N183 Chronic kidney disease, stage 3 unspecified: Secondary | ICD-10-CM | POA: Insufficient documentation

## 2020-12-19 DIAGNOSIS — I10 Essential (primary) hypertension: Secondary | ICD-10-CM

## 2020-12-19 DIAGNOSIS — U071 COVID-19: Secondary | ICD-10-CM

## 2020-12-19 NOTE — Telephone Encounter (Signed)
Pt is calling back for COVID & flu results. [phone]

## 2020-12-19 NOTE — Telephone Encounter (Signed)
Selena Bender called back on 12/19/2020 at 1:07 PM to discuss the potential use of a new treatment for mild to moderate COVID-19 viral infection in non-hospitalized patients.  She is interested in trying to get scheduled for mAb on 5/16 and I have added her to our list.  Murray Hodgkins, NP 12/19/2020 1:07 PM

## 2020-12-19 NOTE — Progress Notes (Signed)
I connected by phone with Selena Bender on 12/19/2020 at 1:09 PM to discuss the potential use of a new treatment for mild to moderate COVID-19 viral infection in non-hospitalized patients.  This patient is a 70 y.o. female that meets the FDA criteria for Emergency Use Authorization of COVID monoclonal antibody bebtelovimab.  Has a (+) direct SARS-CoV-2 viral test result  Has mild or moderate COVID-19   Is NOT hospitalized due to COVID-19  Is within 7 days of symptom onset  Has at least one of the high risk factor(s) for progression to severe COVID-19 and/or hospitalization as defined in EUA.  Specific high risk criteria : Older age (>/= 69 yo), Chronic Kidney Disease (CKD) and Cardiovascular disease or hypertension   I have spoken and communicated the following to the patient or parent/caregiver regarding COVID monoclonal antibody treatment:  1. FDA has authorized the emergency use for the treatment of mild to moderate COVID-19 in adults and pediatric patients with positive results of direct SARS-CoV-2 viral testing who are 3 years of age and older weighing at least 40 kg, and who are at high risk for progressing to severe COVID-19 and/or hospitalization.  2. The significant known and potential risks and benefits of COVID monoclonal antibody, and the extent to which such potential risks and benefits are unknown.  3. Information on available alternative treatments and the risks and benefits of those alternatives, including clinical trials.  4. Patients treated with COVID monoclonal antibody should continue to self-isolate and use infection control measures (e.g., wear mask, isolate, social distance, avoid sharing personal items, clean and disinfect "high touch" surfaces, and frequent handwashing) according to CDC guidelines.   5. The patient or parent/caregiver has the option to accept or refuse COVID monoclonal antibody treatment.  6. Discussion about the monoclonal antibody  infusion does not ensure treatment. The patient will be placed on a list and scheduled according to risk, symptom onset and availability. A scheduler will reach to the patient to let them know if we can accommodate their infusion or not.  After reviewing this information with the patient, the patient has agreed to receive one of the available covid 19 monoclonal antibodies and will be provided an appropriate fact sheet prior to infusion. Murray Hodgkins, NP 12/19/2020 1:09 PM

## 2020-12-19 NOTE — Telephone Encounter (Addendum)
Called to discuss with patient about COVID-19 symptoms and the use of one of the available treatments for those with mild to moderate Covid symptoms and at a high risk of hospitalization.  Pt appears to qualify for outpatient treatment due to co-morbid conditions and/or a member of an at-risk group in accordance with the FDA Emergency Use Authorization.    Symptom onset: 12/17/20 Cough,chills,sore throat,fatigue,headache Vaccinated: Yes Booster? No Immunocompromised? MS Qualifiers: MS,HTN NIH Criteria: Tier 1   States she will call back.  Would like to speak with APP. Marcello Moores

## 2020-12-19 NOTE — Telephone Encounter (Signed)
I called Selena Bender to discuss Covid symptoms and the use of or antiviral agents or a monoclonal antibody infusion for those with mild to moderate Covid symptoms and at a high risk of hospitalization.    Pt is qualified for this infusion at the monoclonal antibody infusion center due to co-morbid conditions and/or a member of an at-risk group, however declines infusion at this time secondary to cost.  She is not a candidate for oral antiviral therapy due to a lack of blood work w/in the past 90 days. Patient hung up on me before I could review symptomatic mgmt or reasons for seeking additional care. Last date pt would be eligible for infusion is 12/23/2020.   Murray Hodgkins, NP

## 2020-12-22 ENCOUNTER — Ambulatory Visit: Payer: Medicare Other

## 2020-12-22 ENCOUNTER — Ambulatory Visit: Payer: Medicare Other | Admitting: Physical Therapy

## 2020-12-24 ENCOUNTER — Ambulatory Visit: Payer: Medicare Other | Admitting: Physical Therapy

## 2021-01-05 DIAGNOSIS — Z8616 Personal history of COVID-19: Secondary | ICD-10-CM | POA: Diagnosis not present

## 2021-01-25 DIAGNOSIS — R42 Dizziness and giddiness: Secondary | ICD-10-CM | POA: Diagnosis not present

## 2021-01-25 DIAGNOSIS — R569 Unspecified convulsions: Secondary | ICD-10-CM | POA: Diagnosis not present

## 2021-01-25 DIAGNOSIS — R55 Syncope and collapse: Secondary | ICD-10-CM | POA: Diagnosis not present

## 2021-01-25 DIAGNOSIS — I959 Hypotension, unspecified: Secondary | ICD-10-CM | POA: Diagnosis not present

## 2021-01-26 ENCOUNTER — Other Ambulatory Visit: Payer: Medicare Other

## 2021-01-26 DIAGNOSIS — R569 Unspecified convulsions: Secondary | ICD-10-CM | POA: Diagnosis not present

## 2021-01-26 DIAGNOSIS — E7800 Pure hypercholesterolemia, unspecified: Secondary | ICD-10-CM | POA: Diagnosis not present

## 2021-01-26 DIAGNOSIS — C50911 Malignant neoplasm of unspecified site of right female breast: Secondary | ICD-10-CM | POA: Diagnosis not present

## 2021-01-26 DIAGNOSIS — I10 Essential (primary) hypertension: Secondary | ICD-10-CM | POA: Diagnosis not present

## 2021-01-26 DIAGNOSIS — Z0000 Encounter for general adult medical examination without abnormal findings: Secondary | ICD-10-CM | POA: Diagnosis not present

## 2021-01-26 DIAGNOSIS — R1310 Dysphagia, unspecified: Secondary | ICD-10-CM | POA: Diagnosis not present

## 2021-01-26 DIAGNOSIS — R55 Syncope and collapse: Secondary | ICD-10-CM | POA: Diagnosis not present

## 2021-01-26 DIAGNOSIS — G8929 Other chronic pain: Secondary | ICD-10-CM | POA: Diagnosis not present

## 2021-01-28 ENCOUNTER — Ambulatory Visit
Admission: RE | Admit: 2021-01-28 | Discharge: 2021-01-28 | Disposition: A | Discharge status: Home / Self Care | Payer: Medicare Other | Source: Ambulatory Visit | Attending: Family Medicine | Admitting: Family Medicine

## 2021-01-28 ENCOUNTER — Other Ambulatory Visit: Payer: Self-pay

## 2021-01-28 DIAGNOSIS — E2839 Other primary ovarian failure: Secondary | ICD-10-CM

## 2021-01-28 DIAGNOSIS — Z780 Asymptomatic menopausal state: Secondary | ICD-10-CM | POA: Diagnosis not present

## 2021-01-28 DIAGNOSIS — M8589 Other specified disorders of bone density and structure, multiple sites: Secondary | ICD-10-CM | POA: Diagnosis not present

## 2021-02-03 DIAGNOSIS — R1310 Dysphagia, unspecified: Secondary | ICD-10-CM | POA: Diagnosis not present

## 2021-02-03 DIAGNOSIS — G8929 Other chronic pain: Secondary | ICD-10-CM | POA: Diagnosis not present

## 2021-02-03 DIAGNOSIS — G35 Multiple sclerosis: Secondary | ICD-10-CM | POA: Diagnosis not present

## 2021-02-03 DIAGNOSIS — I10 Essential (primary) hypertension: Secondary | ICD-10-CM | POA: Diagnosis not present

## 2021-02-05 ENCOUNTER — Encounter: Payer: Self-pay | Admitting: Physical Medicine and Rehabilitation

## 2021-02-10 ENCOUNTER — Other Ambulatory Visit (HOSPITAL_COMMUNITY): Payer: Self-pay | Admitting: Family Medicine

## 2021-02-10 DIAGNOSIS — R1310 Dysphagia, unspecified: Secondary | ICD-10-CM

## 2021-02-16 ENCOUNTER — Ambulatory Visit (HOSPITAL_BASED_OUTPATIENT_CLINIC_OR_DEPARTMENT_OTHER): Payer: Medicare Other | Admitting: Nurse Practitioner

## 2021-03-29 NOTE — Progress Notes (Signed)
[place]   [phone]) [phone] Fax:([phone]) [place]off Note   Patient Care Team: Glenis Smoker, MD as PCP - General (Family Medicine) Pieter Partridge, DO as Consulting Physician (Neurology) Date of Service: 03/30/2021   CHIEF COMPLAINTS/PURPOSE OF CONSULTATION:  History of ER/PR/Her2 + right breast cancer, referred by PCP Dr. Sela Hilding  HISTORY OF PRESENTING ILLNESS:  Selena Bender 70 y.o. female with past medical history of multiple sclerosis, neuropathy, HTN, and CKD is here because of history of screening detected right breast cancer. Mammogram 08/16/12 showed 1.3 cm spiculated mass in 12:00 right breast, a new small 5 mm area of increased density in the right upper outer quadrant, and multiple calcifications in the retroareolar region that are abnormal and suspicious. Diagnostic mammo 09/13/12 showed a broad region of pleomorphic microcalcifications in the upper anterior right breast measuring 7 cm, a 1.5 cm spiculated mass at 12:00, and approximately 3 cm lateral to this area an additional area of architectural distortion. US showed a 1.4 cm irregular solid mass at 12:00 and adjacent to a separate nodule measuring 6 mm, concerning for multifocal malignancy. She underwent US guided core biopsy of the 12:00 mass on 10/03/12, path showed ER + 99% strong, PR + 100% strong, and HER2 + (3+ IHC) grade 3 invasive ductal carcinoma, suspicious for LVI with microcalcification. She underwent right mastectomy 11/02/2012 at [facility], path showed grade 3 multifocal invasive adenocarcinoma with grade 3 DCIS, 0/3 + LN's, clear margins. There was no LVI or PNI, this was staged pT1c pN0 Mx, stage IA. She completed adjuvant chemotherapy with Taxotere, carboplatin, and Herceptin ([place]) q3 weeks x6 with G-CSF and 1 year of herceptin (12/2013) by Dr. Jayme Cloud at [facility] in [place].  She tolerated chemo well except chronic nausea.  She has been on Anastrozole  since she completed chemo (2014), tolerating well without significant toxicities. Last seen by surgical oncologist Dr. Josepha Pigg 09/02/14 and medical oncology in 2021 in [place] before relocating. She is compliant with annual mammograms last done 05/27/2020 was negative for malignancy.  DEXA 01/28/2021 showed osteopenia, lowest T score -2.2 at the forearm radius with a FRAX 5.2% risk of major osteoporotic fracture and 0.9% risk of hip fracture in the next 10 years.  Socially, she is widowed, husband died from metastatic prostate cancer.  She relocated from [place] to be with family.  Independent with ADLs but never learned to drive, originally from [place], [place].  Up-to-date on cancer screenings.  Drinks alcohol socially, smokes cigarettes on and off for 50 years, less than 1 pack/day, denies other drug use.  Denies any family history of cancer.  GYN HISTORY  Menarchal: 9 LMP: natural, age 72 HRT: none G2P2  Today, she presents with her daughter.  Reports doing well overall, MS has been stable lately.  She has chronic fatigue and neuropathy mainly in her legs. She has burning pain at the low right axilla/chest wall that has worsened over past few months. She is on gabapentin. She was prescribed Norco by her PCP, would take one before increased activity which helped her neuropathy. She has been out for several months and is asking for a refill. Was told by her PCP that she does not prescribed narcotics.   Otherwise, she tolerates anastrozole without bone or joint pain, hot flashes, or major mood fluctuations.  Denies changes in her bowel habits, bleeding, headaches, abdominal pain, or other new concerns.  MEDICAL HISTORY:  Past Medical History:  Diagnosis  Date   Breast cancer [facility]) 2014   Right   CKD (chronic kidney disease), stage III ([place])    COVID-19 virus infection 12/2020   Hypertension    Multiple sclerosis ([place])    Seizures ([place])     SURGICAL HISTORY: Past Surgical History:   Procedure Laterality Date   BREAST SURGERY     MASTECTOMY Right 2014    SOCIAL HISTORY: Social History   Socioeconomic History   Marital status: Widowed    Spouse name: Not on file   Number of children: 2   Years of education: Not on file   Highest education level: Not on file  Occupational History   Not on file  Tobacco Use   Smoking status: Former    Types: Cigarettes   Smokeless tobacco: Never  Vaping Use   Vaping Use: Never used  Substance and Sexual Activity   Alcohol use: Yes   Drug use: Yes    Types: Marijuana    Comment: Gummies   Sexual activity: Not Currently  Other Topics Concern   Not on file  Social History Narrative   Not on file   Social Determinants of Health   Financial Resource Strain: Not on file  Food Insecurity: Not on file  Transportation Needs: Not on file  Physical Activity: Not on file  Stress: Not on file  Social Connections: Not on file  Intimate Partner Violence: Not on file    FAMILY HISTORY: Family History  Problem Relation Age of Onset   Sickle cell anemia Father     ALLERGIES:  has No Known Allergies.  MEDICATIONS:  Current Outpatient Medications  Medication Sig Dispense Refill   amLODipine (NORVASC) 10 MG tablet Take 1 tablet (10 mg total) by mouth daily. 90 tablet 3   anastrozole (ARIMIDEX) 1 MG tablet Take 1 mg by mouth daily.     ascorbic acid (VITAMIN C) 500 MG/5ML syrup Take by mouth daily.     cholecalciferol (VITAMIN D) 25 MCG (1000 UNIT) tablet Take 4 tablets (4,000 Units total) by mouth daily. 90 tablet 3   diazepam (VALIUM) 5 MG tablet Take 1 tablet 40 to 60 minutes prior to MRI. 1 tablet 0   fluticasone (FLONASE) 50 MCG/ACT nasal spray Place 1 spray into both nostrils at bedtime. 16 g 6   gabapentin (NEURONTIN) 250 MG/5ML solution TAKE 8 MLS BY MOUTH 3 TIMES DAILY. 2160 mL 12   HYDROcodone-acetaminophen (NORCO/VICODIN) 5-325 MG tablet Take 1 tablet by mouth every 6 (six) hours as needed for moderate pain. 60  tablet 0   lamoTRIgine (LAMICTAL) 100 MG tablet Take 1 tablet (100 mg total) by mouth daily. 30 tablet 5   loratadine (CLARITIN) 10 MG tablet Take 1 tablet (10 mg total) by mouth daily. 30 tablet 11   losartan (COZAAR) 100 MG tablet Take 1 tablet (100 mg total) by mouth daily. 90 tablet 3   traMADol (ULTRAM) 50 MG tablet Take 1 tablet (50 mg total) by mouth daily as needed for severe pain. 15 tablet 0   No current facility-administered medications for this visit.    REVIEW OF SYSTEMS:   Constitutional: Denies fevers, chills or abnormal night sweats (+) chronic fatigue Eyes: Denies blurriness of vision, double vision or watery eyes Ears, nose, mouth, throat, and face: Denies mucositis or sore throat Respiratory: Denies cough, dyspnea or wheezes Cardiovascular: Denies palpitation, chest discomfort or lower extremity swelling Gastrointestinal:  Denies nausea, heartburn or change in bowel habits Skin: Denies abnormal skin rashes Lymphatics: Denies  new lymphadenopathy or easy bruising Neurological:Denies numbness, tingling or new weaknesses (+) MS, stable (+) neuropathy in legs > arms (+) pain in right low axilla/chest wall  Behavioral/Psych: Mood is stable, no new changes  All other systems were reviewed with the patient and are negative.  PHYSICAL EXAMINATION:  Vitals:   03/30/21 1125  BP: (!) 142/83  Pulse: 98  Resp: 18  Temp: 97.8 F (36.6 C)  SpO2: 98%   Filed Weights   03/30/21 1125  Weight: 138 lb 5 oz (62.7 kg)    GENERAL:alert, no distress and comfortable SKIN: No rash EYES: sclera clear NECK: Without mass LYMPH:  no palpable cervical or supraclavicular lymphadenopathy  LUNGS: clear with normal breathing effort HEART: regular rate & rhythm, no lower extremity edema ABDOMEN:abdomen soft, non-tender and normal bowel sounds Musculoskeletal:no focal spinal tenderness. TTP right posterolateral chest wall/low axilla  PSYCH: alert & oriented x 3 with fluent speech NEURO:  no focal motor deficits Breast exam: S/p right mastectomy, incision completely healed, no palpable mass or nodularity along the chest wall or incision, left breast, or either axilla that I could appreciate.  No left nipple discharge or inversion.  LABORATORY DATA:  I have reviewed the data as listed CBC Latest Ref Rng & Units 09/03/2020 11/30/2019 09/15/2018  WBC 3.4 - 10.8 x10E3/uL 6.3 5.8 5.6  Hemoglobin 11.1 - 15.9 g/dL 11.9 12.8 11.3  Hematocrit 34.0 - 46.6 % 35.6 40.0 33.3(L)  Platelets 150 - 450 x10E3/uL 244 241 204   CMP Latest Ref Rng & Units 09/03/2020 11/30/2019 05/31/2019  Glucose 65 - 99 mg/dL 94 94 94  BUN 8 - 27 mg/dL $Remove'24 14 18  'BJLrEZl$ Creatinine 0.57 - 1.00 mg/dL 1.90(H) 1.49(H) 1.36(H)  Sodium 134 - 144 mmol/L 138 139 142  Potassium 3.5 - 5.2 mmol/L 5.0 4.6 4.3  Chloride 96 - 106 mmol/L 103 103 106  CO2 20 - 29 mmol/L $RemoveB'21 23 21  'SbeEhHUo$ Calcium 8.7 - 10.3 mg/dL 9.9 9.9 9.4  Total Protein 6.0 - 8.5 g/dL 7.8 8.1 7.6  Total Bilirubin 0.0 - 1.2 mg/dL 0.3 0.4 0.3  Alkaline Phos 44 - 121 IU/L 72 70 67  AST 0 - 40 IU/L $Remov'22 19 16  'fjNgdA$ ALT 0 - 32 IU/L $Remov'13 15 12     'KYDRgH$ RADIOGRAPHIC STUDIES: I have personally reviewed the radiological images as listed and agreed with the findings in the report. No results found.  ASSESSMENT & PLAN: 70 yo female   1.Malignant neoplasm of the central right breast, invasive ductal carcinoma and DCIS, ER/PR/HER2 +, grade 3, pT1c pN0 Mx stage IA -We reviewed her outside records record in detail with the patient and her daughter.  -She had screening detected stage IA ER/PR/HER2 + breast cancer in 2014 s/p right mastectomy, adjuvant chemotherapy [facility]) and 1 year of herceptin therapy. She has been on AI for 8 years, tolerating well -given that she had mastectomy and negative lymph nodes on final path, she did not need adjuvant radiation -left mammogram 05/2020 is negative. She is doing well on surveillance -we discussed the risk of recurrence in HER2 positive breast cancer.  Continue surveillance  -due to stage IA disease, she has had adequate benefit of anti-estrogen therapy now after 8 years, she can stop AI now.  -Continue surveillance. Next mammo in 05/2021 which I ordered today -f/up in 6 months   2. Right low axilla/chest wall pain and neuropathy  -She has had pain at the mastectomy scar since surgery. She developed right posterolateral chest wall  pain months ago, worse lately  -ganapentin for neuropathy, partially effective -she was prescribed Norco in the past, takes sparingly usually only before going out or doing more activity but she ran out. -Norco was being filled by Dr. Pamella Pert and Huston Foley Just, NP. I plan to discuss pain management with them. -we also discussed referral to pain specialist, patient will think about it. -she is mildly tender to palpation. She is being referred for CT chest to r/o recurrence. We will call her with results.  -She was given a short course of tramadol.  -if negative for breast cancer recurrence, f/up with PCP and neuro. She may benefit from med adjustments for neuropathy.   3. Multiple sclerosis and neuropathy -stable per 06/06/2020 brain MRI, on med regimen -f/up Neuro Dr. Tomi Likens   4. HTN, CKD -on losartan, recently PCP stopped amlodipine -last CMP 09/03/20 showed Scr 1.9  5. Bone health, osteopenia -DEXA 07/14/2017 showed lowest T score -1.6 at right femur neck, with FRAX score 6% risk of major osteoporotic fracture and 1.3% risk of hip fracture in 10 years -DEXA 01/28/2021 showed worsening osteopenia, lowest T score -2.2 at forearm radius.  FRAX score had improved to 5.2% risk of major osteoporotic fracture and 0.9% risk of hip fracture in 10 years -We reviewed the potential benefit and side effects of Zometa, however due to her CKD she is not a candidate -I recommend to maximize calcium and vitamin D and weightbearing exercise -She has been on AI for 8 years, likely contributing to worsening osteopenia, she can stop  today  PLAN: -medical record reviewed -stop anastrozole -continue breast cancer surveillance -Lab and CT chest wo contrast in 1-2 weeks for right chest wall pain, r/o recurrence. We will call her with results -Rx tramadol once daily PRN for severe pain -if CT negative, f/up PCP and neuro for pain   Orders Placed This Encounter  Procedures   CT Chest Wo Contrast    Standing Status:   Future    Standing Expiration Date:   03/30/2022    Order Specific Question:   Preferred imaging location?    Answer:   [facility]   MM 3D SCREEN BREAST UNI LEFT    Standing Status:   Future    Standing Expiration Date:   03/30/2022    Order Specific Question:   Reason for Exam (SYMPTOM  OR DIAGNOSIS REQUIRED)    Answer:   h/o right breast cancer 2014 s/p mastectomy    Order Specific Question:   Preferred imaging location?    Answer:   [facility]   CA 27.29    Standing Status:   Standing    Number of Occurrences:   20    Standing Expiration Date:   03/30/2022   CBC with Differential ([place] Only)    Standing Status:   Standing    Number of Occurrences:   20    Standing Expiration Date:   03/30/2022   CMP ([place] only)    Standing Status:   Standing    Number of Occurrences:   20    Standing Expiration Date:   03/30/2022     All questions were answered. The patient knows to call the clinic with any problems, questions or concerns.     Alla Feeling, NP 03/31/2021 10:43 AM   Addendum  I have seen the patient, examined her. I agree with the assessment and and plan and have edited the notes.   71 yo AAF with history  if pT1cN0M0 stage I triple positive breast cancer, status postmastectomy, adjuvant chemotherapy and anti-HER2 therapy.  She has been on anastrozole since then for a total of 8 years now.  I think that she has had adequate adjuvant antiestrogen therapy, and recommend her stop now. She is overall clinically doing well, however does report right-sided chest  pain for the past several months, especially in the past few months, and requests narcotics for pain.  She was on hydrocodone occasionally in the past.  Chest x-ray was negative 3 months ago.  I recommend CT chest without contrast for further evaluation of her worsening right-sided chest pain, to ruled out cancer recurrence, also I do not have high suspicion. If CT negative. Will continue annual screening mammogram and breast cancer surveillance. Her recent DEXA scan showed worsening osteopenia, however she is not a candidate for biphosphonate or Prolia due to her CKD. I will call her after CT in a few weeks. Will f/u in 6 months.   Truitt Merle  03/30/2021

## 2021-03-30 ENCOUNTER — Inpatient Hospital Stay: Payer: Medicare Other | Attending: Nurse Practitioner | Admitting: Nurse Practitioner

## 2021-03-30 ENCOUNTER — Other Ambulatory Visit: Payer: Self-pay

## 2021-03-30 VITALS — BP 142/83 | HR 98 | Temp 97.8°F | Resp 18 | Wt 138.3 lb

## 2021-03-30 DIAGNOSIS — Z170 Estrogen receptor positive status [ER+]: Secondary | ICD-10-CM | POA: Diagnosis not present

## 2021-03-30 DIAGNOSIS — C50111 Malignant neoplasm of central portion of right female breast: Secondary | ICD-10-CM | POA: Diagnosis not present

## 2021-03-30 DIAGNOSIS — C50411 Malignant neoplasm of upper-outer quadrant of right female breast: Secondary | ICD-10-CM

## 2021-03-30 DIAGNOSIS — Z9011 Acquired absence of right breast and nipple: Secondary | ICD-10-CM | POA: Diagnosis not present

## 2021-03-30 DIAGNOSIS — Z79811 Long term (current) use of aromatase inhibitors: Secondary | ICD-10-CM | POA: Diagnosis not present

## 2021-03-30 DIAGNOSIS — Z1231 Encounter for screening mammogram for malignant neoplasm of breast: Secondary | ICD-10-CM

## 2021-03-30 DIAGNOSIS — Z9221 Personal history of antineoplastic chemotherapy: Secondary | ICD-10-CM | POA: Diagnosis not present

## 2021-03-30 MED ORDER — TRAMADOL HCL 50 MG PO TABS: 50.0000 mg | ORAL_TABLET | Freq: Every day | ORAL | 0 refills | Status: DC | PRN

## 2021-03-31 ENCOUNTER — Encounter: Payer: Self-pay | Admitting: Nurse Practitioner

## 2021-04-02 ENCOUNTER — Telehealth: Payer: Self-pay | Admitting: Hematology

## 2021-04-02 NOTE — Telephone Encounter (Signed)
Scheduled follow-up appointments per 8/22 los. Patient is aware. 

## 2021-04-06 ENCOUNTER — Other Ambulatory Visit: Payer: Self-pay

## 2021-04-06 ENCOUNTER — Inpatient Hospital Stay: Payer: Medicare Other

## 2021-04-06 ENCOUNTER — Telehealth: Payer: Self-pay

## 2021-04-06 DIAGNOSIS — Z170 Estrogen receptor positive status [ER+]: Secondary | ICD-10-CM | POA: Diagnosis not present

## 2021-04-06 DIAGNOSIS — C50111 Malignant neoplasm of central portion of right female breast: Secondary | ICD-10-CM | POA: Diagnosis not present

## 2021-04-06 DIAGNOSIS — Z9011 Acquired absence of right breast and nipple: Secondary | ICD-10-CM | POA: Diagnosis not present

## 2021-04-06 DIAGNOSIS — Z9221 Personal history of antineoplastic chemotherapy: Secondary | ICD-10-CM | POA: Diagnosis not present

## 2021-04-06 DIAGNOSIS — Z79811 Long term (current) use of aromatase inhibitors: Secondary | ICD-10-CM | POA: Diagnosis not present

## 2021-04-06 LAB — CBC WITH DIFFERENTIAL (CANCER CENTER ONLY)
Abs Immature Granulocytes: 0.01 10*3/uL (ref 0.00–0.07)
Basophils Absolute: 0.1 10*3/uL (ref 0.0–0.1)
Basophils Relative: 1 %
Eosinophils Absolute: 0.1 10*3/uL (ref 0.0–0.5)
Eosinophils Relative: 2 %
HCT: 33.9 % — ABNORMAL LOW (ref 36.0–46.0)
Hemoglobin: 11.7 g/dL — ABNORMAL LOW (ref 12.0–15.0)
Immature Granulocytes: 0 %
Lymphocytes Relative: 48 %
Lymphs Abs: 2.9 10*3/uL (ref 0.7–4.0)
MCH: 27.8 pg (ref 26.0–34.0)
MCHC: 34.5 g/dL (ref 30.0–36.0)
MCV: 80.5 fL (ref 80.0–100.0)
Monocytes Absolute: 0.5 10*3/uL (ref 0.1–1.0)
Monocytes Relative: 9 %
Neutro Abs: 2.4 10*3/uL (ref 1.7–7.7)
Neutrophils Relative %: 40 %
Platelet Count: 190 10*3/uL (ref 150–400)
RBC: 4.21 MIL/uL (ref 3.87–5.11)
RDW: 14.1 % (ref 11.5–15.5)
WBC Count: 5.9 10*3/uL (ref 4.0–10.5)
nRBC: 0 % (ref 0.0–0.2)

## 2021-04-06 LAB — CMP (CANCER CENTER ONLY)
ALT: 10 U/L (ref 0–44)
AST: 19 U/L (ref 15–41)
Albumin: 4.1 g/dL (ref 3.5–5.0)
Alkaline Phosphatase: 57 U/L (ref 38–126)
Anion gap: 10 (ref 5–15)
BUN: 17 mg/dL (ref 8–23)
CO2: 23 mmol/L (ref 22–32)
Calcium: 9.7 mg/dL (ref 8.9–10.3)
Chloride: 109 mmol/L (ref 98–111)
Creatinine: 1.5 mg/dL — ABNORMAL HIGH (ref 0.44–1.00)
GFR, Estimated: 38 mL/min — ABNORMAL LOW (ref >60)
Glucose, Bld: 94 mg/dL (ref 70–99)
Potassium: 4.9 mmol/L (ref 3.5–5.1)
Sodium: 142 mmol/L (ref 135–145)
Total Bilirubin: 0.4 mg/dL (ref 0.3–1.2)
Total Protein: 7.9 g/dL (ref 6.5–8.1)

## 2021-04-06 NOTE — Telephone Encounter (Signed)
-----   Message from Alla Feeling, NP sent at 04/06/2021 11:21 AM EDT ----- Mickel Baas, could you please schedule CT chest wo contrast this week or next. Order is in and authorized.  Thanks, Regan Rakers

## 2021-04-06 NOTE — Telephone Encounter (Signed)
This nurse called to schedule pt for CT chest. Pt is aware of appt 04/10/21 at 0800 and knows to arrive at [place]; however, asks for it to be r/s to 9/8 when she has her EGD. Pt was provided number to centralized scheduling to call and r/s if she should prefer. Pt verbalized thanks and understanding.

## 2021-04-07 LAB — CANCER ANTIGEN 27.29: CA 27.29: 26.8 U/mL (ref 0.0–38.6)

## 2021-04-10 ENCOUNTER — Ambulatory Visit (HOSPITAL_COMMUNITY): Admission: RE | Admit: 2021-04-10 | Payer: Medicare Other | Source: Ambulatory Visit

## 2021-04-16 ENCOUNTER — Ambulatory Visit (HOSPITAL_COMMUNITY): Admission: RE | Admit: 2021-04-16 | Payer: Medicare Other | Source: Ambulatory Visit

## 2021-04-16 ENCOUNTER — Encounter (HOSPITAL_COMMUNITY): Payer: Self-pay

## 2021-05-08 ENCOUNTER — Encounter: Payer: Medicare Other | Admitting: Physical Medicine and Rehabilitation

## 2021-06-05 ENCOUNTER — Other Ambulatory Visit: Payer: Self-pay

## 2021-06-05 ENCOUNTER — Ambulatory Visit
Admission: RE | Admit: 2021-06-05 | Discharge: 2021-06-05 | Disposition: A | Discharge status: Home / Self Care | Payer: Medicare Other | Source: Ambulatory Visit | Attending: Nurse Practitioner | Admitting: Nurse Practitioner

## 2021-06-05 DIAGNOSIS — Z1231 Encounter for screening mammogram for malignant neoplasm of breast: Secondary | ICD-10-CM | POA: Diagnosis not present

## 2021-06-10 DIAGNOSIS — M8580 Other specified disorders of bone density and structure, unspecified site: Secondary | ICD-10-CM | POA: Diagnosis not present

## 2021-06-10 DIAGNOSIS — I10 Essential (primary) hypertension: Secondary | ICD-10-CM | POA: Diagnosis not present

## 2021-06-10 DIAGNOSIS — Z23 Encounter for immunization: Secondary | ICD-10-CM | POA: Diagnosis not present

## 2021-06-10 DIAGNOSIS — C50911 Malignant neoplasm of unspecified site of right female breast: Secondary | ICD-10-CM | POA: Diagnosis not present

## 2021-06-10 DIAGNOSIS — G35 Multiple sclerosis: Secondary | ICD-10-CM | POA: Diagnosis not present

## 2021-06-10 DIAGNOSIS — R1310 Dysphagia, unspecified: Secondary | ICD-10-CM | POA: Diagnosis not present

## 2021-07-17 ENCOUNTER — Telehealth: Payer: Self-pay

## 2021-07-17 NOTE — Telephone Encounter (Signed)
Pt called requesting refill for Tramadol 50 mg. The request was routed to Wilber Bihari, NP. Pt aware and verbalized thanks.

## 2021-07-21 ENCOUNTER — Other Ambulatory Visit (HOSPITAL_COMMUNITY): Payer: Self-pay

## 2021-07-21 DIAGNOSIS — R1310 Dysphagia, unspecified: Secondary | ICD-10-CM

## 2021-07-21 DIAGNOSIS — G35 Multiple sclerosis: Secondary | ICD-10-CM | POA: Diagnosis not present

## 2021-07-21 DIAGNOSIS — R899 Unspecified abnormal finding in specimens from other organs, systems and tissues: Secondary | ICD-10-CM | POA: Diagnosis not present

## 2021-07-21 DIAGNOSIS — R1312 Dysphagia, oropharyngeal phase: Secondary | ICD-10-CM | POA: Diagnosis not present

## 2021-07-21 DIAGNOSIS — D649 Anemia, unspecified: Secondary | ICD-10-CM | POA: Diagnosis not present

## 2021-07-21 DIAGNOSIS — R059 Cough, unspecified: Secondary | ICD-10-CM

## 2021-07-24 ENCOUNTER — Ambulatory Visit: Payer: Medicare Other | Admitting: Physical Medicine and Rehabilitation

## 2021-07-29 ENCOUNTER — Encounter: Payer: Medicare Other | Admitting: Physical Medicine and Rehabilitation

## 2021-08-04 DIAGNOSIS — N1832 Chronic kidney disease, stage 3b: Secondary | ICD-10-CM | POA: Diagnosis not present

## 2021-08-04 DIAGNOSIS — I10 Essential (primary) hypertension: Secondary | ICD-10-CM | POA: Diagnosis not present

## 2021-08-06 ENCOUNTER — Ambulatory Visit (HOSPITAL_COMMUNITY): Payer: Medicare Other

## 2021-08-06 ENCOUNTER — Ambulatory Visit (HOSPITAL_COMMUNITY)
Admission: RE | Admit: 2021-08-06 | Discharge: 2021-08-06 | Disposition: A | Discharge status: Home / Self Care | Payer: Medicare Other | Source: Ambulatory Visit | Attending: Gastroenterology | Admitting: Gastroenterology

## 2021-08-06 ENCOUNTER — Other Ambulatory Visit: Payer: Self-pay

## 2021-08-06 ENCOUNTER — Encounter (HOSPITAL_COMMUNITY): Payer: Self-pay

## 2021-08-13 ENCOUNTER — Telehealth (HOSPITAL_COMMUNITY): Payer: Self-pay

## 2021-08-13 NOTE — Telephone Encounter (Signed)
Attempted to contact patient to schedule OP MBS - left voicemail. ?

## 2021-08-27 ENCOUNTER — Ambulatory Visit (HOSPITAL_COMMUNITY)
Admission: RE | Admit: 2021-08-27 | Discharge: 2021-08-27 | Disposition: A | Discharge status: Home / Self Care | Payer: Medicare Other | Source: Ambulatory Visit | Attending: Gastroenterology | Admitting: Gastroenterology

## 2021-08-27 ENCOUNTER — Other Ambulatory Visit: Payer: Self-pay

## 2021-08-27 DIAGNOSIS — R1312 Dysphagia, oropharyngeal phase: Secondary | ICD-10-CM

## 2021-08-27 DIAGNOSIS — R059 Cough, unspecified: Secondary | ICD-10-CM | POA: Insufficient documentation

## 2021-08-27 DIAGNOSIS — R1310 Dysphagia, unspecified: Secondary | ICD-10-CM | POA: Diagnosis not present

## 2021-09-22 NOTE — Progress Notes (Signed)
NEUROLOGY FOLLOW UP OFFICE NOTE  Selena Bender [id number]  Assessment/Plan:   1.  Multiple sclerosis 2.  Focal onset seizures with impaired consciousness, stable 3.  Cervical spinal stenosis.  There is some cord mass effect at C4-5 level.  Didn't see neurosurgery.  Denies any progression of symptoms over past year.  Unlikely contributing to her symptoms.  Will continue to monitor. 4.  Hyperextension of left knee - causes instability on her feet which is a concern for her. 5.  Hypertension   1.  Seizure prophylaxis: Lamotrigine 100mg  daily 2.  Pain management:  Increase gabapentin to 600mg  three times daily 3.  D3 4000 IU daily 4.  Refer to orthopedics regarding hyperextension of left knee 6.  Check D level, B6, B12 7.  Follow up with PCP regarding blood pressure. 8.  Follow up one year.  Subjective:  Selena Bender is a 71 year old right-handed black female with hypertension and history of breast cancer who follows up for multiple sclerosis and seizure.  Accompanied by her daughter who supplements history.   UPDATE: Current disease modifying therapy:  None Current seizure prophylaxis:  Lamotrigine 100mg  daily Current medications:  Gabapentin 300mg  twice daily (for nerve pain, supposed to take 600mg  twice daily), lamotrigine 100mg  daily (for seizure prophylaxis), Percocet (for nerve pain), vitamin D 4000 IU daily  Referred to neurosurgery for opinion regarding the cervical spinal stenosis. Did not receive call to schedule appointment  Never received call about seeing orthopedics for her knee.   Vision:  OK.  Dysconjugate gaze. Motor:  Left foot weakness.  Sometimes her left hand cramps and closes, toes on left foot cramp. She reports that when she stands, her left knee "goes back", causing balance problems.  No pain Sensory:  She has neuropathy involving the right leg.   Pain:  Burning pins and needles sensation along the lateral right leg from foot to hip.  No back  pain.  Usually gabapentin helps but not recently.  She uses hydrocodone which is more effective.   Gait:  Unsteady.  Tendency to lean backwards.  Left foot drags.  Poor balance Bowel/Bladder:  No issues. Fatigue:  Yes but manageable. Cognition:  Once in awhile she may have word-finding difficulty. Mood:  Some depression from time to time.  Not chronic.     HISTORY: Multiple Sclerosis: She had an initial flare up when she was in her 47s.  She had an episode of numbness from the waist down and difficulty with fine-finger movement, lasting 3-4 months.  After several years, she then started dragging her left foot.  She was formally diagnosed with multiple sclerosis in 2005 after MRI and CSF analysis.  She was started on Copaxone but stopped about 2 years ago.  She has not had any relapses but has had a gradual decline over the last several years, primary affecting her balance and gait.     Past disease modifying therapy:  Copaxone (stopped in 2017- 2018 because she no longer had a neurologist)    Seizure Disorder: She also has a seizure disorder, possibly secondary to MS.  She has had a total of 3 seizures.  Last seizure was in the mid-90s.   Past anti-epileptic medications:  Dilantin   Imaging: 06/06/2020 MRI BRAIN W WO:  Stable 06/06/2020 MRI CERVICAL W WO:  multifocal MS cord lesions with similar multilevel degenerative changes causing similar mass effect on the left eccentric cord at C4-5 and C5-6 with severe bilateral foraminal stenosis.  05/11/2019  MRI BRAIN W WO:  Advanced chronic demyelinating disease involving the cerebral white matter and cortex as as well as suspected superimposed chronic small vessel ischemia particularly involving the right caudate and cerebellum. 05/11/2019 MRI CERVICAL W WO:  Chronic multifocal patchy demyelination involving the spinal cord and cervicomedullary junction, with severe involvement of left hemicord at C4 level.  Degenerative cervical spine disease with  moderate spinal stenosis causing mass effect on left hemicord at C4-5 with moderate to severe bilateral foraminal stenosis; lesser spinal stenosis at C6-7; moderate to severe bilateral foraminal stenosis at C6-7 and C7-T1.   PAST MEDICAL HISTORY: Past Medical History:  Diagnosis Date   Breast cancer ([place]) 2014   Right   CKD (chronic kidney disease), stage III ([place])    COVID-19 virus infection 12/2020   Hypertension    Multiple sclerosis ([place])    Seizures ([facility])     MEDICATIONS: Current Outpatient Medications on File Prior to Visit  Medication Sig Dispense Refill   amLODipine (NORVASC) 10 MG tablet Take 1 tablet (10 mg total) by mouth daily. 90 tablet 3   anastrozole (ARIMIDEX) 1 MG tablet Take 1 mg by mouth daily.     ascorbic acid (VITAMIN C) 500 MG/5ML syrup Take by mouth daily.     cholecalciferol (VITAMIN D) 25 MCG (1000 UNIT) tablet Take 4 tablets (4,000 Units total) by mouth daily. 90 tablet 3   diazepam (VALIUM) 5 MG tablet Take 1 tablet 40 to 60 minutes prior to MRI. 1 tablet 0   fluticasone (FLONASE) 50 MCG/ACT nasal spray Place 1 spray into both nostrils at bedtime. 16 g 6   gabapentin (NEURONTIN) 250 MG/5ML solution TAKE 8 MLS BY MOUTH 3 TIMES DAILY. 2160 mL 12   HYDROcodone-acetaminophen (NORCO/VICODIN) 5-325 MG tablet Take 1 tablet by mouth every 6 (six) hours as needed for moderate pain. 60 tablet 0   lamoTRIgine (LAMICTAL) 100 MG tablet Take 1 tablet (100 mg total) by mouth daily. 30 tablet 5   loratadine (CLARITIN) 10 MG tablet Take 1 tablet (10 mg total) by mouth daily. 30 tablet 11   losartan (COZAAR) 100 MG tablet Take 1 tablet (100 mg total) by mouth daily. 90 tablet 3   traMADol (ULTRAM) 50 MG tablet Take 1 tablet (50 mg total) by mouth daily as needed for severe pain. 15 tablet 0   No current facility-administered medications on file prior to visit.    ALLERGIES: No Known Allergies  FAMILY HISTORY: Family History  Problem Relation Age of Onset   Sickle cell  anemia Father       Objective:  Blood pressure (!) 156/85, pulse 80, height 5\' 5"  (1.651 m), weight 145 lb 9.6 oz (66 kg), SpO2 98 %. General: No acute distress.  Patient appears well-groomed.   Head:  Normocephalic/atraumatic Eyes:  Fundi examined but not visualized Neck: supple, no paraspinal tenderness, full range of motion Heart:  Regular rate and rhythm Lungs:  Clear to auscultation bilaterally Back: No paraspinal tenderness Neurological Exam: alert and oriented to person, place, and time.  Speech fluent and not dysarthric, language intact.  Left eye abducted on primary gaze, mildly reduced adduction of right eye.  Otherwise, CN II-XII intact. Bulk and tone increased on left, muscle strength 4+/5 left upper extremity, 2+/5 left hip flexion, left foot drop/exterally rotated, otherwise 5/5.  Sensation to pinprick reduced in left upper and lower extremities, sensation to vibration reduced in lower extremities (right worse than left).  Deep tendon reflexes 2+ throughout, left Babinski.  Finger  to nose past pointing.  Left hemiplegic/spastic gait with externally rotated left foot.  Metta Clines, DO  CC: Sela Hilding, MD

## 2021-09-24 ENCOUNTER — Other Ambulatory Visit: Payer: Self-pay

## 2021-09-24 ENCOUNTER — Encounter: Payer: Self-pay | Admitting: Neurology

## 2021-09-24 ENCOUNTER — Ambulatory Visit (INDEPENDENT_AMBULATORY_CARE_PROVIDER_SITE_OTHER): Payer: Medicare Other | Admitting: Neurology

## 2021-09-24 ENCOUNTER — Other Ambulatory Visit (INDEPENDENT_AMBULATORY_CARE_PROVIDER_SITE_OTHER): Payer: Medicare Other

## 2021-09-24 VITALS — BP 156/85 | HR 80 | Ht 65.0 in | Wt 145.6 lb

## 2021-09-24 DIAGNOSIS — M21862 Other specified acquired deformities of left lower leg: Secondary | ICD-10-CM

## 2021-09-24 DIAGNOSIS — G35 Multiple sclerosis: Secondary | ICD-10-CM

## 2021-09-24 DIAGNOSIS — G5793 Unspecified mononeuropathy of bilateral lower limbs: Secondary | ICD-10-CM

## 2021-09-24 DIAGNOSIS — E559 Vitamin D deficiency, unspecified: Secondary | ICD-10-CM | POA: Diagnosis not present

## 2021-09-24 DIAGNOSIS — I10 Essential (primary) hypertension: Secondary | ICD-10-CM

## 2021-09-24 DIAGNOSIS — M792 Neuralgia and neuritis, unspecified: Secondary | ICD-10-CM

## 2021-09-24 DIAGNOSIS — E538 Deficiency of other specified B group vitamins: Secondary | ICD-10-CM

## 2021-09-24 DIAGNOSIS — M4802 Spinal stenosis, cervical region: Secondary | ICD-10-CM | POA: Diagnosis not present

## 2021-09-24 LAB — VITAMIN B12: Vitamin B-12: 755 pg/mL (ref 211–911)

## 2021-09-24 LAB — VITAMIN D 25 HYDROXY (VIT D DEFICIENCY, FRACTURES): VITD: 73.18 ng/mL (ref 30.00–100.00)

## 2021-09-24 MED ORDER — GABAPENTIN 250 MG/5ML PO SOLN: ORAL | 1 refills | Status: DC

## 2021-09-24 NOTE — Patient Instructions (Signed)
Increase gabapentin to 16 mL three times daily Check vit D, B12 and B6 levels Refer to orthopedics Refer to physical therapy.

## 2021-09-25 NOTE — Progress Notes (Signed)
Tried calling pt, LMOVM for pt to call the office.

## 2021-09-28 LAB — VITAMIN B6: Vitamin B6: 32.7 ng/mL — ABNORMAL HIGH (ref 2.1–21.7)

## 2021-09-28 NOTE — Progress Notes (Unsigned)
[place]   Telephone:(336) [phone] Fax:(336) [phone]   Clinic Follow up Note   Patient Care Team: Glenis Smoker, MD as PCP - General (Family Medicine) Pieter Partridge, DO as Consulting Physician (Neurology) 09/28/2021  CHIEF COMPLAINT: Follow up h/o triple positive R breast cancer   SUMMARY OF ONCOLOGIC HISTORY: Oncology History  Malignant neoplasm of upper-outer quadrant of right breast in female, estrogen receptor positive ([place])  08/16/2012 Initial Diagnosis   Bilateral mammogram on 08/16/2012 showed a spiculated mass in the right breast at 12:00, on ultrasound 1.3cm. An additional focal asymmetry is also seen just lateral and superior to the spiculated mass, but this could not be definitely identified on ultrasound. In addition, extensive pleomorphic calcifications were noted throughout the right upper breast. Total extent of abnormality measures 11 x 7.4 x 6.6 cm.    08/16/2012 Cancer Staging   Staging form: Breast, AJCC 7th Edition - Clinical stage from 08/16/2012: Stage IA (T1c, N0, M0) - Signed by Alla Feeling, NP on 03/30/2021 Stage prefix: Initial diagnosis Laterality: Right Estrogen receptor status: Positive Progesterone receptor status: Positive HER2 status: Positive    10/03/2012 Pathology Results   Ultrasound guided biopsy on 2/25 of the right breast at 12 o'clock returned IDC (ER+, PR+, HER2+, grade 3, LVI suspicious) with microcalcifications    11/02/2012 Surgery   Mastectomy simple Right and axillary lymph node dissection: Grade 3 IDC with DCIS 1.8 cm, 0/3 lymph nodes negative, ER PR positive HER-2 -3+ by IHC, T1c N0 stage Ia    11/02/2012 Cancer Staging   Staging form: Breast, AJCC 7th Edition - Pathologic stage from 11/02/2012: Stage IA (T1c, N0, cM0) - Signed by Alla Feeling, NP on 03/30/2021 Laterality: Right Lymph-vascular invasion (LVI): LVI not present (absent)/not identified Tumor grade (Scarff-Bloom-Richardson system): G3 Estrogen  receptor status: Positive Progesterone receptor status: Positive HER2 status: Positive      CURRENT THERAPY: Completed 8 years adjuvant anastrozole 03/2021, Surveillance   INTERVAL HISTORY: Ms. Selena Bender returns for follow up as scheduled. Last seen 03/30/21 at which time she stopped anastrozole.    REVIEW OF SYSTEMS:   Constitutional: Denies fevers, chills or abnormal weight loss Eyes: Denies blurriness of vision Ears, nose, mouth, throat, and face: Denies mucositis or sore throat Respiratory: Denies cough, dyspnea or wheezes Cardiovascular: Denies palpitation, chest discomfort or lower extremity swelling Gastrointestinal:  Denies nausea, heartburn or change in bowel habits Skin: Denies abnormal skin rashes Lymphatics: Denies new lymphadenopathy or easy bruising Neurological:Denies numbness, tingling or new weaknesses Behavioral/Psych: Mood is stable, no new changes  All other systems were reviewed with the patient and are negative.  MEDICAL HISTORY:  Past Medical History:  Diagnosis Date   Breast cancer ([place]) 2014   Right   CKD (chronic kidney disease), stage III ([place])    COVID-19 virus infection 12/2020   Hypertension    Multiple sclerosis ([place])    Seizures ([place])     SURGICAL HISTORY: Past Surgical History:  Procedure Laterality Date   BREAST SURGERY     MASTECTOMY Right 2014    I have reviewed the social history and family history with the patient and they are unchanged from previous note.  ALLERGIES:  has No Known Allergies.  MEDICATIONS:  Current Outpatient Medications  Medication Sig Dispense Refill   anastrozole (ARIMIDEX) 1 MG tablet Take 1 mg by mouth daily.     ascorbic acid (VITAMIN C) 500 MG/5ML syrup Take by mouth daily.     cholecalciferol (VITAMIN D) 25  MCG (1000 UNIT) tablet Take 4 tablets (4,000 Units total) by mouth daily. 90 tablet 3   diazepam (VALIUM) 5 MG tablet Take 1 tablet 40 to 60 minutes prior to MRI. 1 tablet 0   fluticasone (FLONASE) 50  MCG/ACT nasal spray Place 1 spray into both nostrils at bedtime. 16 g 6   gabapentin (NEURONTIN) 250 MG/5ML solution 16 mL three times daily. 4320 mL 1   HYDROcodone-acetaminophen (NORCO/VICODIN) 5-325 MG tablet Take 1 tablet by mouth every 6 (six) hours as needed for moderate pain. 60 tablet 0   lamoTRIgine (LAMICTAL) 100 MG tablet Take 1 tablet (100 mg total) by mouth daily. 30 tablet 5   loratadine (CLARITIN) 10 MG tablet Take 1 tablet (10 mg total) by mouth daily. 30 tablet 11   losartan (COZAAR) 100 MG tablet Take 1 tablet (100 mg total) by mouth daily. 90 tablet 3   traMADol (ULTRAM) 50 MG tablet Take 1 tablet (50 mg total) by mouth daily as needed for severe pain. 15 tablet 0   No current facility-administered medications for this visit.    PHYSICAL EXAMINATION: ECOG PERFORMANCE STATUS: {CHL ONC ECOG PS:[id number]}  There were no vitals filed for this visit. There were no vitals filed for this visit.  GENERAL:alert, no distress and comfortable SKIN: skin color, texture, turgor are normal, no rashes or significant lesions EYES: normal, Conjunctiva are pink and non-injected, sclera clear OROPHARYNX:no exudate, no erythema and lips, buccal mucosa, and tongue normal  NECK: supple, thyroid normal size, non-tender, without nodularity LYMPH:  no palpable lymphadenopathy in the cervical, axillary or inguinal LUNGS: clear to auscultation and percussion with normal breathing effort HEART: regular rate & rhythm and no murmurs and no lower extremity edema ABDOMEN:abdomen soft, non-tender and normal bowel sounds Musculoskeletal:no cyanosis of digits and no clubbing  NEURO: alert & oriented x 3 with fluent speech, no focal motor/sensory deficits  LABORATORY DATA:  I have reviewed the data as listed CBC Latest Ref Rng & Units 04/06/2021 09/03/2020 11/30/2019  WBC 4.0 - 10.5 K/uL 5.9 6.3 5.8  Hemoglobin 12.0 - 15.0 g/dL 11.7(L) 11.9 12.8  Hematocrit 36.0 - 46.0 % 33.9(L) 35.6 40.0  Platelets  150 - 400 K/uL 190 244 241     CMP Latest Ref Rng & Units 04/06/2021 09/03/2020 11/30/2019  Glucose 70 - 99 mg/dL 94 94 94  BUN 8 - 23 mg/dL $Remove'17 24 14  'HuRYWWF$ Creatinine 0.44 - 1.00 mg/dL 1.50(H) 1.90(H) 1.49(H)  Sodium 135 - 145 mmol/L 142 138 139  Potassium 3.5 - 5.1 mmol/L 4.9 5.0 4.6  Chloride 98 - 111 mmol/L 109 103 103  CO2 22 - 32 mmol/L $RemoveB'23 21 23  'kuiRYrfQ$ Calcium 8.9 - 10.3 mg/dL 9.7 9.9 9.9  Total Protein 6.5 - 8.1 g/dL 7.9 7.8 8.1  Total Bilirubin 0.3 - 1.2 mg/dL 0.4 0.3 0.4  Alkaline Phos 38 - 126 U/L 57 72 70  AST 15 - 41 U/L $Remo'19 22 19  'nhwnb$ ALT 0 - 44 U/L $Remo'10 13 15      'ATyco$ RADIOGRAPHIC STUDIES: I have personally reviewed the radiological images as listed and agreed with the findings in the report. No results found.   ASSESSMENT & PLAN:  No problem-specific Assessment & Plan notes found for this encounter.   No orders of the defined types were placed in this encounter.  All questions were answered. The patient knows to call the clinic with any problems, questions or concerns. No barriers to learning was detected. I spent {CHL ONC TIME VISIT -  XTAVW:[id number]} counseling the patient face to face. The total time spent in the appointment was {CHL ONC TIME VISIT - VZSMO:[id number]} and more than 50% was on counseling and review of test results     Alla Feeling, NP 09/28/21

## 2021-09-30 ENCOUNTER — Inpatient Hospital Stay: Payer: Medicare Other | Attending: Family Medicine

## 2021-09-30 ENCOUNTER — Inpatient Hospital Stay: Payer: Medicare Other | Admitting: Nurse Practitioner

## 2021-10-07 ENCOUNTER — Ambulatory Visit (INDEPENDENT_AMBULATORY_CARE_PROVIDER_SITE_OTHER): Payer: Medicare Other | Admitting: Orthopedic Surgery

## 2021-10-07 ENCOUNTER — Ambulatory Visit (INDEPENDENT_AMBULATORY_CARE_PROVIDER_SITE_OTHER): Payer: Medicare Other

## 2021-10-07 ENCOUNTER — Encounter: Payer: Self-pay | Admitting: Orthopedic Surgery

## 2021-10-07 ENCOUNTER — Other Ambulatory Visit: Payer: Self-pay

## 2021-10-07 DIAGNOSIS — G8929 Other chronic pain: Secondary | ICD-10-CM | POA: Diagnosis not present

## 2021-10-07 DIAGNOSIS — M2242 Chondromalacia patellae, left knee: Secondary | ICD-10-CM

## 2021-10-07 DIAGNOSIS — M25562 Pain in left knee: Secondary | ICD-10-CM | POA: Diagnosis not present

## 2021-10-07 DIAGNOSIS — M2352 Chronic instability of knee, left knee: Secondary | ICD-10-CM

## 2021-10-07 NOTE — Progress Notes (Addendum)
Office Visit Note   Patient: Selena Bender           Date of Birth: 1950-09-23           MRN: [id number] Visit Date: 10/07/2021 Requested by: Drema Dallas, DO [address]  [address] [place],  [place] [address] PCP: Shon Hale, MD  Subjective: Chief Complaint  Patient presents with   Other     Left knee hyperextension    HPI: Patient presents for evaluation of hyperextension of the left leg.  Patient has MS.  She states this has been going on for possibly longer than 20 years.  Describes swelling.  No prior surgery to the left knee.  The knee does give way at times.  She denies much in the way of pain.  Denies any discrete injury associated with this problem.  She does not do too much with exercise.  She starting therapy for gait training in the near future.  She does use a cane.  This hyperextension issue does cause the patient to lose balance.  Her symptoms are not really worsening they are essentially staying the same in terms of the hyperextension problem.              ROS: All systems reviewed are negative as they relate to the chief complaint within the history of present illness.  Patient denies  fevers or chills.   Assessment & Plan: Visit Diagnoses:  1. Chronic pain of left knee     Plan: Impression is left leg hyperextension with ambulation in a patient who does have some hip flexor weakness.  Radiographic abnormalities are present on the medial tibial plateau which appear subacute to chronic in nature.  Mild effusion is present.  Plan is custom ACL brace which prevents hyperextension and MRI scan of the left knee to evaluate this medial tibial plateau deformity.  Patient has obvious hyperextension instability with ambulation which this brace should help to address in terms of making her functionally more ambulatory.  Follow-up after the studies.  Follow-Up Instructions: Return for after MRI.   Orders:  Orders Placed This Encounter  Procedures    XR Knee 1-2 Views Left   MR Knee Left w/o contrast   No orders of the defined types were placed in this encounter.     Procedures: No procedures performed   Clinical Data: No additional findings.  Objective: Vital Signs: There were no vitals taken for this visit.  Physical Exam:   Constitutional: Patient appears well-developed HEENT:  Head: Normocephalic Eyes:EOM are normal Neck: Normal range of motion Cardiovascular: Normal rate Pulmonary/chest: Effort normal Neurologic: Patient is alert Skin: Skin is warm Psychiatric: Patient has normal mood and affect   Ortho Exam: Ortho exam demonstrates hyperextension gait on the left compared to the right.  She has 5 out of 5 ankle dorsiflexion plantarflexion quad and hamstring strength.  Quad strength about 5- out of 5 on the left compared to the right.  Hip flexion strength on the left is about 3 out of 5 compared to 5 out of 5 on the right.  Slight quad atrophy left versus right.  Pedal pulses palpable.  No groin pain with internal/external rotation of the leg.  Patient does have some laxity to varus and valgus stress at 0 and 30 degrees on the left knee compared to the right.  ACL and PCL feel intact.  Mild effusion present.  Extensor mechanism intact.  Specialty Comments:  No specialty comments available.  Imaging: XR Knee 1-2 Views Left  Result Date: 10/07/2021 AP lateral radiographs left knee reviewed.  No acute fracture.  Mild degenerative changes are present.  There is posterior sloping of the medial tibial plateau which appears subacute to chronic in nature.  No loose bodies.  Moderate degenerative changes noted in the right knee on the AP view.    PMFS History: Patient Active Problem List   Diagnosis Date Noted   CKD (chronic kidney disease), stage III ([facility])    COVID-19 virus infection 12/2020   History of right breast cancer 03/13/2020   Status post right mastectomy 03/13/2020   Abnormal gait 09/15/2018    Claustrophobia 09/15/2018   Hypertension 09/15/2018   Localization-related epilepsy ([facility]) 09/15/2018   Multiple sclerosis ([facility]) 09/15/2018   Paresthesia 09/15/2018   Pain in lower limb 12/31/2015   Seizure ([facility]) 12/31/2015   Neuropathic pain 06/02/2015   No advance directives 11/02/2012   Refusal of blood transfusion for reasons of conscience 11/02/2012   Malignant neoplasm of upper-outer quadrant of right breast in female, estrogen receptor positive ([facility]) 11/01/2012   Past Medical History:  Diagnosis Date   Breast cancer ([facility]) 2014   Right   CKD (chronic kidney disease), stage III ([facility])    COVID-19 virus infection 12/2020   Hypertension    Multiple sclerosis ([facility])    Seizures ([facility])     Family History  Problem Relation Age of Onset   Sickle cell anemia Father     Past Surgical History:  Procedure Laterality Date   BREAST SURGERY     MASTECTOMY Right 2014   Social History   Occupational History   Not on file  Tobacco Use   Smoking status: Former    Types: Cigarettes   Smokeless tobacco: Never  Vaping Use   Vaping Use: Never used  Substance and Sexual Activity   Alcohol use: Yes   Drug use: Yes    Types: Marijuana    Comment: Gummies   Sexual activity: Not Currently

## 2021-10-14 ENCOUNTER — Telehealth: Payer: Self-pay | Admitting: Orthopedic Surgery

## 2021-10-14 NOTE — Telephone Encounter (Signed)
LMOM for pt to return call to sch post MRI after 10/22/21 with GD ?

## 2021-10-19 ENCOUNTER — Ambulatory Visit: Payer: Medicare Other | Admitting: Physical Medicine and Rehabilitation

## 2021-10-22 ENCOUNTER — Other Ambulatory Visit: Payer: Medicare Other

## 2021-10-28 ENCOUNTER — Encounter: Payer: Self-pay | Admitting: Physical Medicine and Rehabilitation

## 2021-10-28 ENCOUNTER — Encounter
Payer: Medicare Other | Attending: Physical Medicine and Rehabilitation | Admitting: Physical Medicine and Rehabilitation

## 2021-10-28 ENCOUNTER — Other Ambulatory Visit: Payer: Self-pay

## 2021-10-28 VITALS — BP 136/89 | HR 87 | Temp 98.4°F | Ht 65.0 in | Wt 145.0 lb

## 2021-10-28 DIAGNOSIS — G35 Multiple sclerosis: Secondary | ICD-10-CM | POA: Insufficient documentation

## 2021-10-28 DIAGNOSIS — Z9011 Acquired absence of right breast and nipple: Secondary | ICD-10-CM | POA: Insufficient documentation

## 2021-10-28 DIAGNOSIS — M792 Neuralgia and neuritis, unspecified: Secondary | ICD-10-CM | POA: Diagnosis not present

## 2021-10-28 MED ORDER — NALTREXONE HCL 50 MG PO TABS: 25.0000 mg | ORAL_TABLET | Freq: Every day | ORAL | 5 refills | Status: DC

## 2021-10-28 NOTE — Patient Instructions (Signed)
Pt is a 71 yr old R handed female with hx of relapsing remitting MS and breast CA and seizures here for evaluation of chronic pain in B/L arms and chest due to mastectomy- dx'd 2014- and in remission.  ?A lot of nerve pain, likely from MS.  ?Here for evaluation of MS and pain.  ? ?LLE is weak, but about the same as 1 year ago per pt- weakness is more the issue with gait ? Had a place for PT- [phone]- is number for [address]  ? ?2. Try to avoid heat- since can make her weaker.  ? ? ?3. If takes magnesium, can take up to 1000 mg /day orally- or can do a spray, but max 1000 mg/day. Only side effects are looser stools.  ? ? ?4. Cannot use Cymbalta due to swallowing issues- cannot be opened. However can take Tramadol- so, can possibly try Cymbalta in future.  ? ? ?5. Naltrexone- 25 mg nightly- can crush 1/2 tab and put in applesauce/pudding, yogurt, etc. To be taken nightly-.  ? ? ?6.   Asking for additional pain meds. Will see how Naltrexone helps prior to changing meds.  ? ? ?7. F/U in 3 months, but call me in 2-4 weeks to let me know how thinking.  ?

## 2021-10-28 NOTE — Progress Notes (Signed)
? ?Subjective:  ? ? Patient ID: Selena Bender, female    DOB: 09-Apr-1951, 71 y.o.   MRN: [id number] ? ?HPI ? ?Pt is a 72 yr old female with hx of relapsing remitting MS and breast CA and seizures  (not on meds)- last seizure 1998- here for evaluation of chronic pain in B/L arms and chest due to mastectomy- dx'd 2014- and in remission.  ?A lot of nerve pain, likely from MS.  ?Here for evaluation of MS and pain.  ? ? ?Has a lot of pain from mastectomy site and a lot of nerve pain from MS.  ?Gabapentin doesn't help. Doesn't feel like it makes a difference if misses a dose.  ? ?Has tried "pain killers" ? ?Some days, pain is "terrible.  ? ?Pain- is burning and stinging and pins and needles- B/L legs and hips and bottom of feet.  ? ?Cancer doctor gave her 1 Rx for Norco- and was helpful, but didn't see her again.  ? ?Was on Lamictal >1 year ago- for Seizures, but hasn't taken in ~ 1 year-  ?Wasn't effective for nerve pain.  ? ? ?As a result of MS; walking is slower and hard to step up/down with stairs, inclines etc.  ? ?No falls recently- 1-2 near falls- lately-  ?Sometimes caught by daughter and sometimes catches herself.  ? ?Neurology- Dr Tomi Likens-  ?Not on anything for MS- tried Copaxone for years- ?Has been off Copaxone x 4 years.  ?MRI lesions "aren't growing"- so decided to not do meds.  ?06/06/20 was last MRI.  ? ? ?Spasticity- has some muscle spasms- painful- occ spasms can put off balance as well.  ? ? ?Of note, R chest pain is aching and radiates into her back on Right side.  ?Not burning.  ? ? ?Tried: ?Gabapentin 800 mg tid ?OTC rubs ?Tramadol- helped ?Norco- helped- a great deal. But only had 1 month of Rx.  ?Never tried lyrica or Cymbalta or any other nerve pain meds ? ? ?Pain Inventory ?Average Pain 9 ?Pain Right Now 8 ?My pain is sharp, burning, stabbing, tingling, and aching ? ?In the last 24 hours, has pain interfered with the following? ?General activity 7 ?Relation with others 7 ?Enjoyment of life  8 ?What TIME of day is your pain at its worst? varies ?Sleep (in general) NA ? ?Pain is worse with: walking, bending, sitting, standing, and some activites ?Pain improves with:  na ?Relief from Meds:  na ? ?use a cane ?use a walker ?ability to climb steps?  yes ?do you drive?  no ? ?not employed: date last employed   ?disabled: date disabled 105 years ?I need assistance with the following:  dressing, household duties, and shopping ? ?bladder control problems ?weakness ?numbness ?tingling ?trouble walking ?dizziness ?confusion ?depression ?anxiety ? ?Any changes since last visit?  no ? ?Primary care Sela Hilding ?Neurologist Dr Loretta Plume ? ? ? ?Family History  ?Problem Relation Age of Onset  ? Sickle cell anemia Father   ? ?Social History  ? ?Socioeconomic History  ? Marital status: Widowed  ?  Spouse name: Not on file  ? Number of children: 2  ? Years of education: Not on file  ? Highest education level: Not on file  ?Occupational History  ? Not on file  ?Tobacco Use  ? Smoking status: Former  ?  Types: Cigarettes  ? Smokeless tobacco: Never  ?Vaping Use  ? Vaping Use: Never used  ?Substance and Sexual Activity  ? Alcohol use:  Yes  ? Drug use: Yes  ?  Types: Marijuana  ?  Comment: Gummies  ? Sexual activity: Not Currently  ?Other Topics Concern  ? Not on file  ?Social History Narrative  ? Not on file  ? ?Social Determinants of Health  ? ?Financial Resource Strain: Not on file  ?Food Insecurity: Not on file  ?Transportation Needs: Not on file  ?Physical Activity: Not on file  ?Stress: Not on file  ?Social Connections: Not on file  ? ?Past Surgical History:  ?Procedure Laterality Date  ? BREAST SURGERY    ? MASTECTOMY Right 2014  ? ?Past Medical History:  ?Diagnosis Date  ? Breast cancer ([place]) 2014  ? Right  ? CKD (chronic kidney disease), stage III ([place])   ? COVID-19 virus infection 12/2020  ? Hypertension   ? Multiple sclerosis ([place])   ? Seizures ([place])   ? ?BP 136/89   Pulse 87   Temp 98.4 ?F (36.9 ?C)   Ht 5'  5" (1.651 m)   Wt 145 lb (65.8 kg)   SpO2 98%   BMI 24.13 kg/m?  ? ?Opioid Risk Score:   ?Fall Risk Score:  `1 ? ?Depression screen PHQ 2/9 ? ? ?  10/28/2021  ? 10:45 AM 09/03/2020  ? 10:07 AM 06/02/2020  ?  2:30 PM 04/17/2020  ? 11:01 AM 11/30/2019  ? 11:09 AM 05/31/2019  ?  9:57 AM 09/15/2018  ? 11:43 AM  ?Depression screen PHQ 2/9  ?Decreased Interest 2 0 0 0 0 1 0  ?Down, Depressed, Hopeless 2 0 0 0 0 1 0  ?PHQ - 2 Score 4 0 0 0 0 2 0  ?Altered sleeping 2     0   ?Tired, decreased energy 3     0   ?Change in appetite 0     0   ?Feeling bad or failure about yourself  0     1   ?Trouble concentrating 1     0   ?Moving slowly or fidgety/restless 2     0   ?Suicidal thoughts 0     0   ?PHQ-9 Score 12     3   ?Difficult doing work/chores Extremely dIfficult        ?  ? ?Review of Systems  ?Constitutional: Negative.   ?HENT: Negative.    ?Eyes: Negative.   ?Respiratory:  Positive for cough.   ?Cardiovascular: Negative.   ?Gastrointestinal:  Positive for diarrhea.  ?Endocrine: Negative.   ?Genitourinary:   ?     Bladder control  ?Musculoskeletal:  Positive for back pain and gait problem.  ?     Arms and right chest  ?Skin: Negative.   ?Allergic/Immunologic: Negative.   ?Neurological:  Positive for dizziness, weakness and numbness.  ?     Tingling  ?Hematological: Negative.   ?Psychiatric/Behavioral:  Positive for confusion and dysphoric mood. The patient is nervous/anxious.   ?All other systems reviewed and are negative. ? ?   ?Objective:  ? Physical Exam ? ?Awake, alert, appropriate, accompanied by Kasandra Knudsen- daughter- using small quad cane (quad addition on [facility]); has dysconjugate gaze, NAD ? ?RUE- deltoid 4-/5; biceps 5-/5; triceps 5-/5; WE 4+/5; grip 4/5; FA 4/5 ?LUE- deltoid 5-/5; biceps 5-/5; triceps 5-/5; WE 4+/5; grip 4/5; and FA 2+/5 ?RLE- HF 5-/5; KE/KF 5-/5; DF 4+/5; and PF 5-/5 ?LLE- HF 2/5; KE 3+/5; KF 3/5; DF 3+/5; and PF 4+/5 ? ?Neuro: ?MAS of 1+; on RLE;  ?MAS of 0-1 in  LLE-  ?No clonus in arms or legs B/L ; LUE  Hoffman's but not on RUE ?Intact to light touch in all 4 extremities per pt ? ? ? ? ?   ?Assessment & Plan:  ? ?Pt is a 71 yr old R handed female with hx of relapsing remitting MS and breast CA and seizures here for evaluation of chronic pain in B/L arms and chest due to mastectomy- dx'd 2014- and in remission.  ?A lot of nerve pain, likely from MS.  ?Here for evaluation of MS and pain.  ? ?LLE is weak, but about the same as 1 year ago per pt- weakness is more the issue with gait ? Had a place for PT- [phone]- is number for [address]  ? ?2. Try to avoid heat- since can make her weaker.  ? ? ?3. If takes magnesium, can take up to 1000 mg /day orally- or can do a spray, but max 1000 mg/day. Only side effects are looser stools.  ? ? ?4. Cannot use Cymbalta due to swallowing issues- cannot be opened. However can take Tramadol- so, can possibly try Cymbalta in future.  ? ? ?5. Naltrexone- 25 mg nightly- can crush 1/2 tab and put in applesauce/pudding, yogurt, etc. To be taken nightly-.  ? ? ?6.   Asking for additional pain meds. Will see how Naltrexone helps prior to changing meds.  ? ? ?7. F/U in 3 months, but call me in 2-4 weeks to let me know how thinking.  ? ? ?I spent a total of 48   minutes on total care today- >50% coordination of care- due to education on naltrexone and other options for nerve pain. Also discussing waiting on controlled pain meds.  ? ?

## 2021-11-02 ENCOUNTER — Ambulatory Visit: Payer: Medicare Other | Attending: Physical Medicine and Rehabilitation | Admitting: Physical Therapy

## 2021-11-02 DIAGNOSIS — R2689 Other abnormalities of gait and mobility: Secondary | ICD-10-CM | POA: Insufficient documentation

## 2021-11-02 DIAGNOSIS — R293 Abnormal posture: Secondary | ICD-10-CM | POA: Insufficient documentation

## 2021-11-02 DIAGNOSIS — M6281 Muscle weakness (generalized): Secondary | ICD-10-CM | POA: Insufficient documentation

## 2021-11-02 DIAGNOSIS — G8929 Other chronic pain: Secondary | ICD-10-CM | POA: Insufficient documentation

## 2021-11-02 DIAGNOSIS — R2681 Unsteadiness on feet: Secondary | ICD-10-CM | POA: Insufficient documentation

## 2021-11-02 DIAGNOSIS — M25562 Pain in left knee: Secondary | ICD-10-CM | POA: Insufficient documentation

## 2021-11-03 ENCOUNTER — Telehealth: Payer: Self-pay

## 2021-11-03 NOTE — Telephone Encounter (Signed)
Received email from [place] with [employer] stating in order for patients insurance to cover part of brace that was ordered for patients hyper extension she needs last OV note addended to have dx code of instability. Can you do this for them? ?

## 2021-11-03 NOTE — Telephone Encounter (Signed)
Done thx

## 2021-11-05 ENCOUNTER — Encounter: Payer: Self-pay | Admitting: Rehabilitation

## 2021-11-05 ENCOUNTER — Ambulatory Visit: Payer: Medicare Other | Admitting: Rehabilitation

## 2021-11-05 DIAGNOSIS — R293 Abnormal posture: Secondary | ICD-10-CM | POA: Diagnosis not present

## 2021-11-05 DIAGNOSIS — G8929 Other chronic pain: Secondary | ICD-10-CM | POA: Diagnosis not present

## 2021-11-05 DIAGNOSIS — R2681 Unsteadiness on feet: Secondary | ICD-10-CM | POA: Diagnosis not present

## 2021-11-05 DIAGNOSIS — M6281 Muscle weakness (generalized): Secondary | ICD-10-CM

## 2021-11-05 DIAGNOSIS — R2689 Other abnormalities of gait and mobility: Secondary | ICD-10-CM | POA: Diagnosis not present

## 2021-11-05 DIAGNOSIS — M25562 Pain in left knee: Secondary | ICD-10-CM | POA: Diagnosis not present

## 2021-11-05 NOTE — Therapy (Signed)
?OUTPATIENT PHYSICAL THERAPY NEURO EVALUATION ? ? ?Patient Name: Selena Bender Junius Creamer ?MRN: [id number] ?DOB:[birth year], 71 y.o., female ?Today's Date: 11/05/2021 ? ?PCP: Glenis Smoker, MD ?REFERRING PROVIDER: Courtney Heys, MD  ? ? PT End of Session - 11/05/21 1238   ? ? Visit Number 1   ? Number of Visits 17   ? Date for PT Re-Evaluation 01/04/22   ? Authorization Type Medicare/[employer] (needs 10th visit PN)   ? Progress Note Due on Visit 10   ? PT Start Time 1102   ? PT Stop Time 1147   ? PT Time Calculation (min) 45 min   ? Activity Tolerance Patient tolerated treatment well   ? Behavior During Therapy [facility] for tasks assessed/performed   ? ?  ?  ? ?  ? ? ?Past Medical History:  ?Diagnosis Date  ? Breast cancer ([place]) 2014  ? Right  ? CKD (chronic kidney disease), stage III ([place])   ? COVID-19 virus infection 12/2020  ? Hypertension   ? Multiple sclerosis ([place])   ? Seizures ([place])   ? ?Past Surgical History:  ?Procedure Laterality Date  ? BREAST SURGERY    ? MASTECTOMY Right 2014  ? ?Patient Active Problem List  ? Diagnosis Date Noted  ? CKD (chronic kidney disease), stage III ([place])   ? COVID-19 virus infection 12/2020  ? History of right breast cancer 03/13/2020  ? Status post right mastectomy 03/13/2020  ? Abnormal gait 09/15/2018  ? Claustrophobia 09/15/2018  ? Hypertension 09/15/2018  ? Localization-related epilepsy ([place]) 09/15/2018  ? Multiple sclerosis ([place]) 09/15/2018  ? Paresthesia 09/15/2018  ? Pain in lower limb 12/31/2015  ? Seizure ([place]) 12/31/2015  ? Neuropathic pain 06/02/2015  ? No advance directives 11/02/2012  ? Refusal of blood transfusion for reasons of conscience 11/02/2012  ? Malignant neoplasm of upper-outer quadrant of right breast in female, estrogen receptor positive ([place]) 11/01/2012  ? ? ?ONSET DATE: Chronic issue (referral date is 10/29/21) ? ?REFERRING DIAG: G35 (ICD-10-CM) - Multiple sclerosis ([place])  ? ?THERAPY DIAG:  ?Unsteadiness on feet ? ?Muscle weakness (generalized) ? ?Chronic pain  of left knee ? ?Abnormal posture ? ?Other abnormalities of gait and mobility ? ?SUBJECTIVE:  ?                                                                                                                                                                                           ? ?SUBJECTIVE STATEMENT: ?"The left knee is hyperextending, esp when I am tired.  Its causing me to be off balance.  I haven't fallen."   ?Pt accompanied by: family member (daughter Kasandra Knudsen) ? ?PERTINENT  HISTORY: Pt is a 71 yr old R handed female with hx of relapsing remitting MS and breast CA and seizures here for evaluation of chronic pain in B/L arms and chest due to mastectomy- dx'd 2014- and in remission.  ? ?PAIN:  ?Are you having pain? Yes: NPRS scale: 7/10 ?Pain location: BLEs ?Pain description: shooting pain ?Aggravating factors: when I get ready to stand up and move ?Relieving factors: nothing (medication doesn't work) ? ?PRECAUTIONS: Fall ? ?WEIGHT BEARING RESTRICTIONS No ? ?FALLS: Has patient fallen in last 6 months? No ? ?LIVING ENVIRONMENT: ?Lives with: lives with their family ?Lives in: House/apartment ?Stairs: Yes: External: 1 steps; none (one curb step) ?Has following equipment at home: Single point cane, Walker - 4 wheeled, and shower chair ? ?PLOF: Independent with basic ADLs and Independent with household mobility with device ? ?PATIENT GOALS : I want to be more stable.  ? ?OBJECTIVE:  ? ?DIAGNOSTIC FINDINGS: n/a ? ?COGNITION: ?Overall cognitive status: Within functional limits for tasks assessed ?  ?SENSATION: ?Light touch: Impaired  Somewhat impaired on R upper thigh.  PT was touching BLEs in same location and she only felt L.  ? ?COORDINATION: ?RLE WFL, unable to perform on LLE due to strength deficits.  ? ? ?MUSCLE TONE: LLE: none in seated position but when asked if leg gets "tight" she does report its hard to bend at times.  ? ? ?MUSCLE LENGTH: ?Hamstring length is normal  ? ? ?POSTURE: rounded shoulders and forward  head ? ?LE ROM:    ? ?Passive  Right ?11/05/2021 Left ?11/05/2021  ?Hip flexion    ?Hip extension    ?Hip abduction    ?Hip adduction    ?Hip internal rotation    ?Hip external rotation    ?Knee flexion    ?Knee extension    ?Ankle dorsiflexion  To Neutral  ?Ankle plantarflexion    ?Ankle inversion    ?Ankle eversion    ? (Blank rows = not tested) ? ?MMT:   ? ?MMT Right ?11/05/2021 Left ?11/05/2021  ?Hip flexion 3+/5 2/5  ?Hip extension    ?Hip abduction 5/5 3+/5  ?Hip adduction 4/5 3+/5  ?Hip internal rotation    ?Hip external rotation    ?Knee flexion 5/5 3+/5  ?Knee extension 4/5 4/5  ?Ankle dorsiflexion 4/5 3+/5  ?Ankle plantarflexion    ?Ankle inversion    ?Ankle eversion    ?(Blank rows = not tested) ? ? ? ?TRANSFERS: ?Assistive device utilized:  SPC with quad tip    ?Sit to stand: SBA ?Stand to sit: SBA ?Chair to chair: SBA ?Floor:  not performed ? ? ? ?GAIT: ?Gait pattern: step to pattern, step through pattern, decreased arm swing- Left, decreased step length- Right, decreased stance time- Left, decreased hip/knee flexion- Left, decreased ankle dorsiflexion- Left, circumduction- Left, genu recurvatum- Left, lateral hip instability, decreased trunk rotation, wide BOS, and poor foot clearance- Left ?Distance walked: 28' x 2, 50' x 1 ?Assistive device utilized:  SPC with quad tip  ?Level of assistance: CGA ?Comments: Pt tends to keep L knee straight and circumduct with gait. She also demos less weight shift L as she is afraid it will lock up on her.  She does not fully weight bear on LLE and therefore doesn't advance over L foot, causing heel cord to be very tight.  ? ?FUNCTIONAL TESTs:  ?Timed up and go (TUG): 35.78 secs with quad tip cane at min/guard A ?10 meter walk test: .92 ft/sec with quad tip  cane and min/guard A ? ? ? ?TODAY'S TREATMENT:  ?Evaluation initiated today.  See above ? ? ?PATIENT EDUCATION: ?Education details: Educated on evaluation results, POC, goals.  Also discussed that PT would call [employer]  to see if she could figure out what type of brace was ordered for her to ensure its most appropriate.  Also discussed use of walker at all times for most safety.  ?Person educated: Patient and Child(ren) ?Education method: Explanation and Verbal cues ?Education comprehension: verbalized understanding ? ? ?HOME EXERCISE PROGRAM: ?None at this time ? ? ? ?GOALS: ?Goals reviewed with patient? Yes ? ?SHORT TERM GOALS: Target date: 12/03/2021 ? ?Pt will be IND with HEP in order to indicate improved functional mobility and dec fall risk. ?Baseline: ?Goal status: INITIAL ? ?2.  Pt will improve gait speed to >/=1.8 ft/sec w/ LRAD (walker vs cane) in order to indicate dec fall risk. ? ?Baseline: 0.92 ft/sec with quad tip cane and min/guard  ?Goal status: INITIAL ? ?3.  Pt will perform BERG and improve balance score 4 points from baseline in order to indicate dec fall risk.  ? ?Baseline:  ?Goal status: INITIAL ? ?4.  Pt will ambulate 150' with RW vs rollator at mod I level in order to indicate more independent household ambulation.  ? ?Baseline:  ?Goal status: INITIAL ? ?5.  Will assess varying AFOs/KAFOs (AFO w/ knee cage) in order to determine which would be most appropriate to dec fall risk.  ?Baseline:  ?Goal status: INITIAL ? ?6.  Pt will improve TUG to </=30 secs w/ LRAD at S level in order to indicate dec fall risk  ?Baseline: 35.78 secs with quad tip cane ?Goal status: INITIAL ? ?LONG TERM GOALS: Target date: 12/31/2021 ? ?Pt will be IND with final HEP in order to indicate improved functional mobility and dec fall risk. ?Baseline:  ?Goal status: INITIAL ? ?2.  Pt will improve gait speed to >/=2.6 ft/sec w/ LRAD in order to indicate dec fall risk. ? ?Baseline: 0.92 ft/sec with quad tip cane and min/guard ?Goal status: INITIAL ? ?3.  Pt will negotiate up/down 4 steps with B rails, up/down ramp and curb and ambulate x 500' outdoors over unlevel paved surfaces at S level with LRAD in order to indicate improved community  mobility.  ? ?Baseline:  ?Goal status: INITIAL ? ?4.  Pt will improve BERG balance score 8 points from baseline in order to indicate dec fall risk.  ? ?Baseline:  ?Goal status: INITIAL ? ?5.  Pt will im

## 2021-11-07 ENCOUNTER — Inpatient Hospital Stay: Admission: RE | Admit: 2021-11-07 | Payer: Medicare Other | Source: Ambulatory Visit

## 2021-11-10 ENCOUNTER — Ambulatory Visit: Payer: Medicare Other | Attending: Family Medicine | Admitting: Physical Therapy

## 2021-11-10 DIAGNOSIS — R2689 Other abnormalities of gait and mobility: Secondary | ICD-10-CM | POA: Diagnosis not present

## 2021-11-10 DIAGNOSIS — R293 Abnormal posture: Secondary | ICD-10-CM

## 2021-11-10 DIAGNOSIS — R2681 Unsteadiness on feet: Secondary | ICD-10-CM

## 2021-11-10 DIAGNOSIS — M6281 Muscle weakness (generalized): Secondary | ICD-10-CM | POA: Diagnosis not present

## 2021-11-10 NOTE — Therapy (Signed)
?OUTPATIENT PHYSICAL THERAPY TREATMENT NOTE ? ? ?Patient Name: Selena Bender ?MRN: [id number] ?DOB:[birth year], 71 y.o., female ?Today's Date: 11/10/2021 ? ?PCP: Glenis Smoker, MD ?REFERRING PROVIDER: Courtney Heys, MD  ? ? PT End of Session - 11/10/21 1111   ? ? Visit Number 2   ? Number of Visits 17   ? Date for PT Re-Evaluation 01/04/22   ? Authorization Type Medicare/[employer] (needs 10th visit PN)   ? Progress Note Due on Visit 10   ? PT Start Time 1108   Pt arrived late  ? PT Stop Time 1145   ? PT Time Calculation (min) 37 min   ? Activity Tolerance Patient tolerated treatment well   ? Behavior During Therapy [facility] for tasks assessed/performed   ? ?  ?  ? ?  ? ? ?Past Medical History:  ?Diagnosis Date  ? Breast cancer ([place]) 2014  ? Right  ? CKD (chronic kidney disease), stage III ([place])   ? COVID-19 virus infection 12/2020  ? Hypertension   ? Multiple sclerosis ([place])   ? Seizures ([place])   ? ?Past Surgical History:  ?Procedure Laterality Date  ? BREAST SURGERY    ? MASTECTOMY Right 2014  ? ?Patient Active Problem List  ? Diagnosis Date Noted  ? CKD (chronic kidney disease), stage III ([place])   ? COVID-19 virus infection 12/2020  ? History of right breast cancer 03/13/2020  ? Status post right mastectomy 03/13/2020  ? Abnormal gait 09/15/2018  ? Claustrophobia 09/15/2018  ? Hypertension 09/15/2018  ? Localization-related epilepsy ([place]) 09/15/2018  ? Multiple sclerosis ([place]) 09/15/2018  ? Paresthesia 09/15/2018  ? Pain in lower limb 12/31/2015  ? Seizure ([place]) 12/31/2015  ? Neuropathic pain 06/02/2015  ? No advance directives 11/02/2012  ? Refusal of blood transfusion for reasons of conscience 11/02/2012  ? Malignant neoplasm of upper-outer quadrant of right breast in female, estrogen receptor positive ([place]) 11/01/2012  ? ? ?REFERRING DIAG: G35 (ICD-10-CM) - Multiple sclerosis ([place])  ? ?THERAPY DIAG:  ?Unsteadiness on feet ? ?Muscle weakness (generalized) ? ?Abnormal posture ? ?Other abnormalities of gait and  mobility ? ?PERTINENT HISTORY: relapsing remitting MS and breast CA and seizures. Pt has MS with LLE wekaness greater than other limbs, with coexisting debility- uses cane to walk has had multiple near falls- eval and treat  ? ?PRECAUTIONS: Fall  ? ?SUBJECTIVE: No new changes, reports she does not know what her knee brace is supposed to look like and she has no updates on it. Does not want to pay $300 OOP for a brace that "may not help me"  ? ?PAIN:  ?Are you having pain? Yes: NPRS scale: 7/10 ?Pain location: BLEs ?Pain description: nerve pain ?Aggravating factors: none ?Relieving factors: meds  ?OBJECTIVE:  ?  ?TODAY'S TREATMENT:  ?Gait pattern:  ER of LLE, step through pattern, decreased step length- Right, decreased stance time- Left, decreased stride length, decreased hip/knee flexion- Left, decreased ankle dorsiflexion- Left, Left foot flat, genu recurvatum- Left, antalgic, wide BOS, and poor foot clearance- Left ?Distance walked: various clinic distances ?Assistive device utilized: Environmental consultant - 4 wheeled ?Level of assistance: SBA ?Comments: Noted significant ER of LLE and relying on adductors during swing phase of LLE.  ? ? ?Ther Act  ? [facility] PT Assessment - 11/10/21 1118   ? ?  ? Balance  ? Balance Assessed Yes   ?  ? Standardized Balance Assessment  ? Standardized Balance Assessment Berg Balance Test   ?  ? Berg Balance Test  ?  Sit to Stand Able to stand without using hands and stabilize independently   ? Standing Unsupported Able to stand safely 2 minutes   ? Sitting with Back Unsupported but Feet Supported on Floor or Stool Able to sit safely and securely 2 minutes   ? Stand to Sit Sits safely with minimal use of hands   ? Transfers Able to transfer safely, definite need of hands   ? Standing Unsupported with Eyes Closed Able to stand 10 seconds with supervision   ? Standing Unsupported with Feet Together Able to place feet together independently and stand for 1 minute with supervision   ? From Standing, Reach  Forward with Outstretched Arm Loses balance while trying/requires external support   Unable to reach  ? From Standing Position, Pick up Object from Floor Able to pick up shoe, needs supervision   ? From Standing Position, Turn to Look Behind Over each Shoulder Turn sideways only but maintains balance   ? Turn 360 Degrees Needs assistance while turning   ? Standing Unsupported, Alternately Place Feet on Step/Stool Needs assistance to keep from falling or unable to try   ? Standing Unsupported, One Foot in Front Needs help to step but can hold 15 seconds   ? Standing on One Leg Tries to lift leg/unable to hold 3 seconds but remains standing independently   ? Total Score 32   ? Berg comment: High fall risk   ? ?  ?  ? ?  ? ?Self-Care/Home management ?Showed pt examples of AFOs to trial in clinic and pt reports she was given one multiple years ago but never wore it because "it hurt so badly". Encouraged pt to attempt to find AFO at home and bring it to next session  ?  ?  ?PATIENT EDUCATION: ?Education details: Berg outcome assessment, establishing HEP next session, trying to find pt's AFO and bring it to next session  ?Person educated: Patient and Cousin Regino Schultze)  ?Education method: Explanation and Verbal cues ?Education comprehension: verbalized understanding ?  ?  ?HOME EXERCISE PROGRAM: ?To be established next session ?  ?  ?  ?GOALS: ?Goals reviewed with patient? Yes ?  ?SHORT TERM GOALS: Target date: 12/03/2021 ?  ?Pt will be IND with HEP in order to indicate improved functional mobility and dec fall risk. ?Baseline: ?Goal status: INITIAL ?  ?2.  Pt will improve gait speed to >/=1.8 ft/sec w/ LRAD (walker vs cane) in order to indicate dec fall risk. ?  ?Baseline: 0.92 ft/sec with quad tip cane and min/guard  ?Goal status: INITIAL ?  ?3.  Pt will perform BERG and improve balance score 4 points from baseline in order to indicate dec fall risk.  ?  ?Baseline: 32/56 on 11/10/21 ?Goal status: INITIAL ?  ?4.  Pt will  ambulate 150' with RW vs rollator at mod I level in order to indicate more independent household ambulation.  ?  ?Baseline:  ?Goal status: INITIAL ?  ?5.  Will assess varying AFOs/KAFOs (AFO w/ knee cage) in order to determine which would be most appropriate to dec fall risk.  ?Baseline:  ?Goal status: INITIAL ?  ?6.  Pt will improve TUG to </=30 secs w/ LRAD at S level in order to indicate dec fall risk  ?Baseline: 35.78 secs with quad tip cane ?Goal status: INITIAL ?  ?LONG TERM GOALS: Target date: 12/31/2021 ?  ?Pt will be IND with final HEP in order to indicate improved functional mobility and dec fall risk. ?Baseline:  ?  Goal status: INITIAL ?  ?2.  Pt will improve gait speed to >/=2.6 ft/sec w/ LRAD in order to indicate dec fall risk. ?  ?Baseline: 0.92 ft/sec with quad tip cane and min/guard ?Goal status: INITIAL ?  ?3.  Pt will negotiate up/down 4 steps with B rails, up/down ramp and curb and ambulate x 500' outdoors over unlevel paved surfaces at S level with LRAD in order to indicate improved community mobility.  ?  ?Baseline:  ?Goal status: INITIAL ?  ?4.  Pt will improve BERG balance score 8 points from baseline in order to indicate dec fall risk.  ?  ?Baseline: 32/56 on 11/10/21 ?Goal status: INITIAL ?  ?5.  Pt will improve TUG to </=20 secs w/ LRAD (and brace as indicated) at S level in order to indicate dec fall risk  ?  ?Baseline:  ?Goal status: INITIAL ?  ?  ?  ?ASSESSMENT: ?  ?CLINICAL IMPRESSION: ?Emphasis of skilled PT session on balance assessment and discussion on bracing options for LLE. Pt demonstrates significant ER and genu recurvatum of LLE due to poor eccentric control of L knee. Pt scored a 32/56 on Berg, indicative of high fall risk. Pt demonstrates the greatest difficulty with staggered stance, single leg and reaching out of BOS. Pt reported she was provided an AFO multiple years ago and never wore it due to it "being so painful". Pt unsure if AFO survived the move to [facility], will look for it  and try to find it to bring to next session. Continue POC.  ?  ?  ?OBJECTIVE IMPAIRMENTS Abnormal gait, decreased activity tolerance, decreased balance, decreased coordination, decreased endurance, decrease

## 2021-11-12 ENCOUNTER — Ambulatory Visit: Payer: Medicare Other | Admitting: Physical Therapy

## 2021-11-12 DIAGNOSIS — R293 Abnormal posture: Secondary | ICD-10-CM

## 2021-11-12 DIAGNOSIS — M6281 Muscle weakness (generalized): Secondary | ICD-10-CM

## 2021-11-12 DIAGNOSIS — R2689 Other abnormalities of gait and mobility: Secondary | ICD-10-CM | POA: Diagnosis not present

## 2021-11-12 DIAGNOSIS — R2681 Unsteadiness on feet: Secondary | ICD-10-CM | POA: Diagnosis not present

## 2021-11-12 NOTE — Therapy (Signed)
?OUTPATIENT PHYSICAL THERAPY TREATMENT NOTE ? ? ?Patient Name: Selena Bender Junius Creamer ?MRN: [id number] ?DOB:1951/07/06, 71 y.o., female ?Today's Date: 11/12/2021 ? ?PCP: Glenis Smoker, MD ?REFERRING PROVIDER: Courtney Heys, MD  ? ? PT End of Session - 11/12/21 1456   ? ? Visit Number 3   ? Number of Visits 17   ? Date for PT Re-Evaluation 01/04/22   ? Authorization Type Medicare/C[employer] (needs 10th visit PN)   ? Progress Note Due on Visit 10   ? PT Start Time 8315   Pt arrived late  ? PT Stop Time 1530   ? PT Time Calculation (min) 35 min   ? Equipment Utilized During Treatment Gait belt;Other (comment)   L AFO  ? Activity Tolerance Patient tolerated treatment well   ? Behavior During Therapy [facility] for tasks assessed/performed   ? ?  ?  ? ?  ? ? ? ?Past Medical History:  ?Diagnosis Date  ? Breast cancer ([place]) 2014  ? Right  ? CKD (chronic kidney disease), stage III ([place])   ? COVID-19 virus infection 12/2020  ? Hypertension   ? Multiple sclerosis ([place])   ? Seizures ([place])   ? ?Past Surgical History:  ?Procedure Laterality Date  ? BREAST SURGERY    ? MASTECTOMY Right 2014  ? ?Patient Active Problem List  ? Diagnosis Date Noted  ? CKD (chronic kidney disease), stage III ([place])   ? COVID-19 virus infection 12/2020  ? History of right breast cancer 03/13/2020  ? Status post right mastectomy 03/13/2020  ? Abnormal gait 09/15/2018  ? Claustrophobia 09/15/2018  ? Hypertension 09/15/2018  ? Localization-related epilepsy ([place]) 09/15/2018  ? Multiple sclerosis ([place]) 09/15/2018  ? Paresthesia 09/15/2018  ? Pain in lower limb 12/31/2015  ? Seizure ([place]) 12/31/2015  ? Neuropathic pain 06/02/2015  ? No advance directives 11/02/2012  ? Refusal of blood transfusion for reasons of conscience 11/02/2012  ? Malignant neoplasm of upper-outer quadrant of right breast in female, estrogen receptor positive ([place]) 11/01/2012  ? ? ?REFERRING DIAG: G35 (ICD-10-CM) - Multiple sclerosis ([place])  ? ?THERAPY DIAG:  ?Unsteadiness on feet ? ?Muscle  weakness (generalized) ? ?Abnormal posture ? ?PERTINENT HISTORY: relapsing remitting MS and breast CA and seizures. Pt has MS with LLE wekaness greater than other limbs, with coexisting debility- uses cane to walk has had multiple near falls- eval and treat  ? ?PRECAUTIONS: Fall  ? ?SUBJECTIVE: Was unable to find AFO, will continue to look for it this weekend. No new changes.  ? ?PAIN:  ?Are you having pain? Yes: NPRS scale: 7/10 ?Pain location: BLEs ?Pain description: nerve pain ?Aggravating factors: none ?Relieving factors: meds  ?OBJECTIVE:  ?  ?TODAY'S TREATMENT:  ?Gait Training  ?Gait pattern: step to pattern, decreased arm swing- Left, decreased step length- Right, decreased step length- Left, decreased stance time- Left, decreased hip/knee flexion- Left, decreased ankle dorsiflexion- Left, Left hip hike, genu recurvatum- Left, lateral hip instability, narrow BOS, and poor foot clearance- Left ?Distance walked: Various clinic distances  ?Assistive device utilized: Single point cane ?Level of assistance: Min A and Mod A ?Comments: w/ L reaction [place] AFO on. Noted continued hyperextension, ER and circumduction of LLE. Several LOB episodes posteriorly requiring mod A to prevent fall. Pt exhibits severe fear-avoidance behavior, mod verbal cues for reduction of ER of LLE.  ?   ?  Ther Ex  ?Blocked practice of sit <>stands, as pt stands by kicking RLE posteriorly and bracing it against chair to assist to stand. Blocked  practice of proper foot placement and anterior weight shifting using NDT principles for 10 minutes. Regressed to blocking RLE as pt unable to maintain proper foot placement.  ? ? ?Self-Care/Home management ?Lengthy discussion with pt and daughter regarding practicing proper standing technique at home and improving pt's confidence with bearing weight on LLE.  ?  ?  ?PATIENT EDUCATION: ?Education details: Proper standing technique, trying to find pt's AFO and bring it to next session  ?Person  educated: Patient and Daughter ?Education method: Explanation and Verbal cues ?Education comprehension: verbalized understanding ?  ?  ?HOME EXERCISE PROGRAM: ?To be established next session ?  ?  ?GOALS: ?Goals reviewed with patient? Yes ?  ?SHORT TERM GOALS: Target date: 12/03/2021 ?  ?Pt will be IND with HEP in order to indicate improved functional mobility and dec fall risk. ?Baseline: ?Goal status: INITIAL ?  ?2.  Pt will improve gait speed to >/=1.8 ft/sec w/ LRAD (walker vs cane) in order to indicate dec fall risk. ?  ?Baseline: 0.92 ft/sec with quad tip cane and min/guard  ?Goal status: INITIAL ?  ?3.  Pt will perform BERG and improve balance score 4 points from baseline in order to indicate dec fall risk.  ?  ?Baseline: 32/56 on 11/10/21 ?Goal status: INITIAL ?  ?4.  Pt will ambulate 150' with RW vs rollator at mod I level in order to indicate more independent household ambulation.  ?  ?Baseline:  ?Goal status: INITIAL ?  ?5.  Will assess varying AFOs/KAFOs (AFO w/ knee cage) in order to determine which would be most appropriate to dec fall risk.  ?Baseline:  ?Goal status: INITIAL ?  ?6.  Pt will improve TUG to </=30 secs w/ LRAD at S level in order to indicate dec fall risk  ?Baseline: 35.78 secs with quad tip cane ?Goal status: INITIAL ?  ?LONG TERM GOALS: Target date: 12/31/2021 ?  ?Pt will be IND with final HEP in order to indicate improved functional mobility and dec fall risk. ?Baseline:  ?Goal status: INITIAL ?  ?2.  Pt will improve gait speed to >/=2.6 ft/sec w/ LRAD in order to indicate dec fall risk. ?  ?Baseline: 0.92 ft/sec with quad tip cane and min/guard ?Goal status: INITIAL ?  ?3.  Pt will negotiate up/down 4 steps with B rails, up/down ramp and curb and ambulate x 500' outdoors over unlevel paved surfaces at S level with LRAD in order to indicate improved community mobility.  ?  ?Baseline:  ?Goal status: INITIAL ?  ?4.  Pt will improve BERG balance score 8 points from baseline in order to  indicate dec fall risk.  ?  ?Baseline: 32/56 on 11/10/21 ?Goal status: INITIAL ?  ?5.  Pt will improve TUG to </=20 secs w/ LRAD (and brace as indicated) at S level in order to indicate dec fall risk  ?  ?Baseline:  ?Goal status: INITIAL ?  ?  ?  ?ASSESSMENT: ?  ?CLINICAL IMPRESSION: ?Emphasis of skilled PT session on gait training w/L AFO and proper sit <>stand technique. Pt exhibits fear-avoidance behavior regarding bearing weight on LLE and does not respond to verbal cues. Noted continued hyperextension of L knee w/L AFO donned but improved when pt reduced ER of LLE. Continue POC.  ?  ?  ?OBJECTIVE IMPAIRMENTS Abnormal gait, decreased activity tolerance, decreased balance, decreased coordination, decreased endurance, decreased knowledge of use of DME, decreased mobility, difficulty walking, decreased ROM, decreased strength, hypomobility, impaired perceived functional ability, impaired flexibility, impaired sensation, and  postural dysfunction.  ?  ?ACTIVITY LIMITATIONS community activity, meal prep, laundry, and shopping.  ?  ?PERSONAL FACTORS Past/current experiences, Time since onset of injury/illness/exacerbation, and 1-2 comorbidities: see above  are also affecting patient's functional outcome.  ?  ?  ?REHAB POTENTIAL: Good ?  ?CLINICAL DECISION MAKING: Evolving/moderate complexity ?  ?EVALUATION COMPLEXITY: Moderate ?  ?PLAN: ?PT FREQUENCY: 2x/week ?  ?PT DURATION: 8 weeks ?  ?PLANNED INTERVENTIONS: Therapeutic exercises, Therapeutic activity, Neuromuscular re-education, Balance training, Gait training, Patient/Family education, Vestibular training, DME instructions, Aquatic Therapy, Electrical stimulation, and Manual therapy ?  ?PLAN FOR NEXT SESSION: initiate HEP for BLE strengthening (L>R) sit<>stand, hamstrings, heel cord stretch.  Did pt bring AFO? Gait training with various AFOs  ? ? ? ?Cruzita Lederer Jermal Dismuke, PT, DPT ?11/12/2021, 3:33 PM ? ?   ?

## 2021-11-19 ENCOUNTER — Ambulatory Visit: Payer: Medicare Other | Admitting: Physical Therapy

## 2021-11-19 DIAGNOSIS — M6281 Muscle weakness (generalized): Secondary | ICD-10-CM | POA: Diagnosis not present

## 2021-11-19 DIAGNOSIS — R2681 Unsteadiness on feet: Secondary | ICD-10-CM

## 2021-11-19 DIAGNOSIS — R293 Abnormal posture: Secondary | ICD-10-CM

## 2021-11-19 DIAGNOSIS — R2689 Other abnormalities of gait and mobility: Secondary | ICD-10-CM | POA: Diagnosis not present

## 2021-11-19 NOTE — Therapy (Signed)
?OUTPATIENT PHYSICAL THERAPY TREATMENT NOTE ? ? ?Patient Name: Selena Bender ?MRN: [id number] ?DOB:July 20, 1951, 71 y.o., female ?Today's Date: 11/19/2021 ? ?PCP: Glenis Smoker, MD ?REFERRING PROVIDER: Courtney Heys, MD  ? ? PT End of Session - 11/19/21 0940   ? ? Visit Number 4   ? Number of Visits 17   ? Date for PT Re-Evaluation 01/04/22   ? Authorization Type Medicare/Cigna (needs 10th visit PN)   ? Progress Note Due on Visit 10   ? PT Start Time [phone]   Pt arrived late  ? PT Stop Time 1015   ? PT Time Calculation (min) 37 min   ? Equipment Utilized During Treatment --   L AFO  ? Activity Tolerance Patient tolerated treatment well   ? Behavior During Therapy [facility] for tasks assessed/performed   ? ?  ?  ? ?  ? ? ? ?Past Medical History:  ?Diagnosis Date  ? Breast cancer ([place]) 2014  ? Right  ? CKD (chronic kidney disease), stage III ([place])   ? COVID-19 virus infection 12/2020  ? Hypertension   ? Multiple sclerosis ([place])   ? Seizures ([place])   ? ?Past Surgical History:  ?Procedure Laterality Date  ? BREAST SURGERY    ? MASTECTOMY Right 2014  ? ?Patient Active Problem List  ? Diagnosis Date Noted  ? CKD (chronic kidney disease), stage III ([place])   ? COVID-19 virus infection 12/2020  ? History of right breast cancer 03/13/2020  ? Status post right mastectomy 03/13/2020  ? Abnormal gait 09/15/2018  ? Claustrophobia 09/15/2018  ? Hypertension 09/15/2018  ? Localization-related epilepsy ([place]) 09/15/2018  ? Multiple sclerosis ([place]) 09/15/2018  ? Paresthesia 09/15/2018  ? Pain in lower limb 12/31/2015  ? Seizure ([place]) 12/31/2015  ? Neuropathic pain 06/02/2015  ? No advance directives 11/02/2012  ? Refusal of blood transfusion for reasons of conscience 11/02/2012  ? Malignant neoplasm of upper-outer quadrant of right breast in female, estrogen receptor positive ([place]) 11/01/2012  ? ? ?REFERRING DIAG: G35 (ICD-10-CM) - Multiple sclerosis ([place])  ? ?THERAPY DIAG:  ?Unsteadiness on feet ? ?Muscle weakness  (generalized) ? ?Abnormal posture ? ?PERTINENT HISTORY: relapsing remitting MS and breast CA and seizures. Pt has MS with LLE wekaness greater than other limbs, with coexisting debility- uses cane to walk has had multiple near falls- eval and treat  ? ?PRECAUTIONS: Fall  ? ?SUBJECTIVE: Still has not found AFO, having more pain today. States the sit <>stands are still a challenge as she frequently loses her balance posteriorly.  ? ?PAIN:  ?Are you having pain? Yes: NPRS scale: 8/10 ?Pain location: BLEs ?Pain description: nerve pain ?Aggravating factors: none ?Relieving factors: meds  ?OBJECTIVE:  ?  ?TODAY'S TREATMENT:  ?Ther Ex ?SciFit level 3 using BLEs/BUEs for 6 minutes for improved BLE position, BLE strength, UE/LE coordination and endurance. Min cues to avoid excessive adduction/abduction of BLEs throughout to promote neutral hip and knee position and quad recruitment. Noted continued ER of L foot and L genu valgus  ? ?Updated and demonstrated HEP for improved bilateral BLE strength, lateral weight shift, single leg stance: ?-Supine bridges, 2x10 w/min assist and min cues for equal weight shift, proper LLE placement and neutral hip rotation  ?-Standing marches at counter, mod cues for upright posture and improved hip flexion as pt demonstrated knee flexion/kickback to compensate  ?-Proper sit <>stand technique, x10 w/R foot block and min cues for anterior weight shift.  ?  ?Gait pattern:  ER of LLE, genu  recurvatum of LLE, wide BOS, decreased step clearance of RLE > LLE, lateral hip instability, decreased hip/knee flexion of LLE  ?Distance walked: Various clinic distances  ?Assistive device utilized: Quad cane small base ?Level of assistance: SBA ?Comments: Pt with very slow cadence and fear-avoidance behavior regarding weightbearing on LLE.  ? ? ?PATIENT EDUCATION: ?Education details: Updates to HEP, trying to find pt's AFO and bring it to next session  ?Person educated: Patient and Daughter ?Education method:  Explanation and Verbal cues and handout ?Education comprehension: verbalized understanding ?  ?  ?HOME EXERCISE PROGRAM: ?[id number] ? ?  ?  ?GOALS: ?Goals reviewed with patient? Yes ?  ?SHORT TERM GOALS: Target date: 12/03/2021 ?  ?Pt will be IND with HEP in order to indicate improved functional mobility and dec fall risk. ?Baseline: ?Goal status: INITIAL ?  ?2.  Pt will improve gait speed to >/=1.8 ft/sec w/ LRAD (walker vs cane) in order to indicate dec fall risk. ?  ?Baseline: 0.92 ft/sec with quad tip cane and min/guard  ?Goal status: INITIAL ?  ?3.  Pt will perform BERG and improve balance score 4 points from baseline in order to indicate dec fall risk.  ?  ?Baseline: 32/56 on 11/10/21 ?Goal status: INITIAL ?  ?4.  Pt will ambulate 150' with RW vs rollator at mod I level in order to indicate more independent household ambulation.  ?  ?Baseline:  ?Goal status: INITIAL ?  ?5.  Will assess varying AFOs/KAFOs (AFO w/ knee cage) in order to determine which would be most appropriate to dec fall risk.  ?Baseline:  ?Goal status: INITIAL ?  ?6.  Pt will improve TUG to </=30 secs w/ LRAD at S level in order to indicate dec fall risk  ?Baseline: 35.78 secs with quad tip cane ?Goal status: INITIAL ?  ?LONG TERM GOALS: Target date: 12/31/2021 ?  ?Pt will be IND with final HEP in order to indicate improved functional mobility and dec fall risk. ?Baseline:  ?Goal status: INITIAL ?  ?2.  Pt will improve gait speed to >/=2.6 ft/sec w/ LRAD in order to indicate dec fall risk. ?  ?Baseline: 0.92 ft/sec with quad tip cane and min/guard ?Goal status: INITIAL ?  ?3.  Pt will negotiate up/down 4 steps with B rails, up/down ramp and curb and ambulate x 500' outdoors over unlevel paved surfaces at S level with LRAD in order to indicate improved community mobility.  ?  ?Baseline:  ?Goal status: INITIAL ?  ?4.  Pt will improve BERG balance score 8 points from baseline in order to indicate dec fall risk.  ?  ?Baseline: 32/56 on 11/10/21 ?Goal  status: INITIAL ?  ?5.  Pt will improve TUG to </=20 secs w/ LRAD (and brace as indicated) at S level in order to indicate dec fall risk  ?  ?Baseline:  ?Goal status: INITIAL ?  ?  ?  ?ASSESSMENT: ?  ?CLINICAL IMPRESSION: ?Emphasis of skilled PT session on BLE strength and updating HEP. Pt demonstrated improved biomechanics with sit <>stands and was able to perform without staggered stance. Pt continues to demonstrate significant ER of LLE, hyperextension of lumbar spine w/gait and step-to pattern. Pt's daughter to try to bring AFO to next session if they can find it. Continue POC.  ?  ?  ?OBJECTIVE IMPAIRMENTS Abnormal gait, decreased activity tolerance, decreased balance, decreased coordination, decreased endurance, decreased knowledge of use of DME, decreased mobility, difficulty walking, decreased ROM, decreased strength, hypomobility, impaired perceived functional ability, impaired flexibility,  impaired sensation, and postural dysfunction.  ?  ?ACTIVITY LIMITATIONS community activity, meal prep, laundry, and shopping.  ?  ?PERSONAL FACTORS Past/current experiences, Time since onset of injury/illness/exacerbation, and 1-2 comorbidities: see above  are also affecting patient's functional outcome.  ?  ?  ?REHAB POTENTIAL: Good ?  ?CLINICAL DECISION MAKING: Evolving/moderate complexity ?  ?EVALUATION COMPLEXITY: Moderate ?  ?PLAN: ?PT FREQUENCY: 2x/week ?  ?PT DURATION: 8 weeks ?  ?PLANNED INTERVENTIONS: Therapeutic exercises, Therapeutic activity, Neuromuscular re-education, Balance training, Gait training, Patient/Family education, Vestibular training, DME instructions, Aquatic Therapy, Electrical stimulation, and Manual therapy ?  ?PLAN FOR NEXT SESSION: How is HEP? sit<>stand practice, heel cord stretch. LLE strengthening.  Did pt bring AFO? Gait training with various AFOs (tried reactive [place], too short for pt)  ? ? ? ?Cruzita Lederer Glema Takaki, PT, DPT ?11/19/2021, 12:08 PM ? ?   ?

## 2021-11-24 ENCOUNTER — Ambulatory Visit: Payer: Medicare Other

## 2021-11-24 DIAGNOSIS — R2681 Unsteadiness on feet: Secondary | ICD-10-CM

## 2021-11-24 DIAGNOSIS — R2689 Other abnormalities of gait and mobility: Secondary | ICD-10-CM

## 2021-11-24 DIAGNOSIS — M6281 Muscle weakness (generalized): Secondary | ICD-10-CM | POA: Diagnosis not present

## 2021-11-24 DIAGNOSIS — R293 Abnormal posture: Secondary | ICD-10-CM

## 2021-11-24 NOTE — Therapy (Signed)
?OUTPATIENT PHYSICAL THERAPY TREATMENT NOTE ? ? ?Patient Name: Selena Bender ?MRN: [id number] ?DOB:11/24/1950, 71 y.o., female ?Today's Date: 11/24/2021 ? ?PCP: Glenis Smoker, MD ?REFERRING PROVIDER: Courtney Heys, MD  ? ? PT End of Session - 11/24/21 1320   ? ? Visit Number 5   ? Number of Visits 17   ? Date for PT Re-Evaluation 01/04/22   ? Authorization Type Medicare/[employer] (needs 10th visit PN)   ? Progress Note Due on Visit 10   ? PT Start Time 1320   ? PT Stop Time 1400   ? PT Time Calculation (min) 40 min   ? Equipment Utilized During Treatment --   L AFO  ? Activity Tolerance Patient tolerated treatment well   ? Behavior During Therapy [facility] for tasks assessed/performed   ? ?  ?  ? ?  ? ? ? ?Past Medical History:  ?Diagnosis Date  ? Breast cancer ([place]) 2014  ? Right  ? CKD (chronic kidney disease), stage III ([place])   ? COVID-19 virus infection 12/2020  ? Hypertension   ? Multiple sclerosis ([place])   ? Seizures ([place])   ? ?Past Surgical History:  ?Procedure Laterality Date  ? BREAST SURGERY    ? MASTECTOMY Right 2014  ? ?Patient Active Problem List  ? Diagnosis Date Noted  ? CKD (chronic kidney disease), stage III ([place])   ? COVID-19 virus infection 12/2020  ? History of right breast cancer 03/13/2020  ? Status post right mastectomy 03/13/2020  ? Abnormal gait 09/15/2018  ? Claustrophobia 09/15/2018  ? Hypertension 09/15/2018  ? Localization-related epilepsy ([place]) 09/15/2018  ? Multiple sclerosis ([place]) 09/15/2018  ? Paresthesia 09/15/2018  ? Pain in lower limb 12/31/2015  ? Seizure ([place]) 12/31/2015  ? Neuropathic pain 06/02/2015  ? No advance directives 11/02/2012  ? Refusal of blood transfusion for reasons of conscience 11/02/2012  ? Malignant neoplasm of upper-outer quadrant of right breast in female, estrogen receptor positive ([place]) 11/01/2012  ? ? ?REFERRING DIAG: G35 (ICD-10-CM) - Multiple sclerosis ([place])  ? ?THERAPY DIAG:  ?Unsteadiness on feet ? ?Muscle weakness (generalized) ? ?Abnormal  posture ? ?Other abnormalities of gait and mobility ? ?PERTINENT HISTORY: relapsing remitting MS and breast CA and seizures. Pt has MS with LLE wekaness greater than other limbs, with coexisting debility- uses cane to walk has had multiple near falls- eval and treat  ? ?PRECAUTIONS: Fall  ? ?SUBJECTIVE: No falls this year. She has pain in both arms and legs that are nerve pain. Pt reports she uses rollator at home but doesn't use it for community as it is hard for someone to lift the walker as it is heavey. Pt reports that she is thinking about buying yoga met to do exercises on floor instead of her bed. ? ?PAIN:  ?Are you having pain? Yes: NPRS scale: 8/10 ?Pain location: BLEs ?Pain description: nerve pain ?Aggravating factors: none ?Relieving factors: meds  ?OBJECTIVE:  ?  ?TODAY'S TREATMENT:  ?Pt asked what is most difficult for patient: wants pain to be better in her legs and arms ?Pt asked if she has been able ot get up and down from floor. Pt reports she can as she has done it in past. We tried stand to floor transfer using chair as support surface. Pt unabel to fully get down to floor and required max A to stand back up again and sit on the chair. Pt educated that she doesn't need to do exercises on floor and she should continue with  them on bed. ?Sit to stand: 10x, able to stand up without HHA support but requires min A with 2 fingers from each hand on chair in front to control eccentric sitting and requires verbal and tactile cues. ?BBS performed 41/56 ?Gait speed performed 082 ft/sec with quad tip cane and SBA ? ? ? ? ? [facility] PT Assessment - 11/24/21 1337   ? ?  ? Berg Balance Test  ? Sit to Stand Able to stand without using hands and stabilize independently   ? Standing Unsupported Able to stand safely 2 minutes   ? Sitting with Back Unsupported but Feet Supported on Floor or Stool Able to sit safely and securely 2 minutes   ? Stand to Sit Sits safely with minimal use of hands   ? Transfers Able to  transfer safely, definite need of hands   ? Standing Unsupported with Eyes Closed Able to stand 10 seconds safely   ? Standing Unsupported with Feet Together Able to place feet together independently and stand 1 minute safely   ? From Standing, Reach Forward with Outstretched Arm Can reach confidently >25 cm (10")   ? From Standing Position, Pick up Object from [place] to pick up shoe safely and easily   ? From Standing Position, Turn to Look Behind Over each Shoulder Looks behind one side only/other side shows less weight shift   ? Turn 360 Degrees Able to turn 360 degrees safely but slowly   ? Standing Unsupported, Alternately Place Feet on Step/Stool Needs assistance to keep from falling or unable to try   ? Standing Unsupported, One Foot in Front Needs help to step but can hold 15 seconds   able to stand with R leg behind for 30 sec but with L leg behind for 15 sec  ? Standing on One Leg Unable to try or needs assist to prevent fall   ? Total Score 41   ? ?  ?  ? ?  ? ?PATIENT EDUCATION: ?Education details: Updates to HEP, trying to find pt's AFO and bring it to next session  ?Person educated: Patient and Daughter ?Education method: Explanation and Verbal cues and handout ?Education comprehension: verbalized understanding ?  ?  ?HOME EXERCISE PROGRAM: ?[id number] ? ?  ?  ?GOALS: ?Goals reviewed with patient? Yes ?  ?SHORT TERM GOALS: Target date: 12/03/2021 ?  ?Pt will be IND with HEP in order to indicate improved functional mobility and dec fall risk. ?Baseline: pt is performing HEP routinely ?Goal status: Goal met ?  ?2.  Pt will improve gait speed to >/=1.8 ft/sec w/ LRAD (walker vs cane) in order to indicate dec fall risk. ?  ?Baseline: 0.92 ft/sec with quad tip cane and min/guard ; 0.82 ft/s with SBA and quad tip st cane. 11/24/21 ?Goal status: progressing ?  ?3.  Pt will perform BERG and improve balance score 4 points from baseline in order to indicate dec fall risk.  ?  ?Baseline: 32/56 on 11/10/21; 41/56  11/25/19 ?Goal status: MET ?  ?4.  Pt will ambulate 150' with RW vs rollator at mod I level in order to indicate more independent household ambulation.  ?  ?Baseline:  ?Goal status: INITIAL ?  ?5.  Will assess varying AFOs/KAFOs (AFO w/ knee cage) in order to determine which would be most appropriate to dec fall risk.  ?Baseline:  ?Goal status: INITIAL ?  ?6.  Pt will improve TUG to </=30 secs w/ LRAD at S level in order to indicate  dec fall risk  ?Baseline: 35.78 secs with quad tip cane ?Goal status: INITIAL ?  ?LONG TERM GOALS: Target date: 12/31/2021 ?  ?Pt will be IND with final HEP in order to indicate improved functional mobility and dec fall risk. ?Baseline:  ?Goal status: INITIAL ?  ?2.  Pt will improve gait speed to >/=2.6 ft/sec w/ LRAD in order to indicate dec fall risk. ?  ?Baseline: 0.92 ft/sec with quad tip cane and min/guard; 0.46ft/s with SBA and quad tip st. Cane 11/24/21 ?Goal status: progressing continue ?  ?3.  Pt will negotiate up/down 4 steps with B rails, up/down ramp and curb and ambulate x 500' outdoors over unlevel paved surfaces at S level with LRAD in order to indicate improved community mobility.  ?  ?Baseline:  ?Goal status: INITIAL ?  ?4.  Pt will improve BERG balance score 8 points from baseline in order to indicate dec fall risk.  ?  ?Baseline: 32/56 on 11/10/21; 41/56 11/25/19 ?Goal status: MET ?5.  Pt will improve TUG to </=20 secs w/ LRAD (and brace as indicated) at S level in order to indicate dec fall risk  ?  ?Baseline:  ?Goal status: INITIAL ?  ?  ?  ?ASSESSMENT: ?  ?CLINICAL IMPRESSION: ?MET LTG#4 today. Pt demonstrates decreased eccentric control with stand to sit transfer with decreased confidence with knee flexion on L LE due to weakness. Pt was unable to perform stand to floor transfer and required max A to be able to get up during transfer. ?  ?  ?OBJECTIVE IMPAIRMENTS Abnormal gait, decreased activity tolerance, decreased balance, decreased coordination, decreased endurance,  decreased knowledge of use of DME, decreased mobility, difficulty walking, decreased ROM, decreased strength, hypomobility, impaired perceived functional ability, impaired flexibility, impaired sensation, and postural

## 2021-11-26 ENCOUNTER — Ambulatory Visit: Payer: Medicare Other | Admitting: Physical Therapy

## 2021-11-26 DIAGNOSIS — R2681 Unsteadiness on feet: Secondary | ICD-10-CM

## 2021-11-26 DIAGNOSIS — M6281 Muscle weakness (generalized): Secondary | ICD-10-CM | POA: Diagnosis not present

## 2021-11-26 DIAGNOSIS — R2689 Other abnormalities of gait and mobility: Secondary | ICD-10-CM | POA: Diagnosis not present

## 2021-11-26 DIAGNOSIS — R293 Abnormal posture: Secondary | ICD-10-CM | POA: Diagnosis not present

## 2021-11-26 NOTE — Therapy (Signed)
?OUTPATIENT PHYSICAL THERAPY TREATMENT NOTE ? ? ?Patient Name: Selena Bender ?MRN: [id number] ?DOB:10-09-1950, 71 y.o., female ?Today's Date: 11/26/2021 ? ?PCP: Glenis Smoker, MD ?REFERRING PROVIDER: Courtney Heys, MD  ? ? PT End of Session - 11/26/21 1108   ? ? Visit Number 6   ? Number of Visits 17   ? Date for PT Re-Evaluation 01/04/22   ? Authorization Type Medicare/[employer] (needs 10th visit PN)   ? Progress Note Due on Visit 10   ? PT Start Time 1105   ? PT Stop Time 1145   ? PT Time Calculation (min) 40 min   ? Equipment Utilized During Treatment Other (comment)   Genu recurvatum knee orthosis on LLE  ? Activity Tolerance Patient tolerated treatment well   ? Behavior During Therapy [facility] for tasks assessed/performed   ? ?  ?  ? ?  ? ? ? ? ?Past Medical History:  ?Diagnosis Date  ? Breast cancer ([place]) 2014  ? Right  ? CKD (chronic kidney disease), stage III ([place])   ? COVID-19 virus infection 12/2020  ? Hypertension   ? Multiple sclerosis ([place])   ? Seizures ([place])   ? ?Past Surgical History:  ?Procedure Laterality Date  ? BREAST SURGERY    ? MASTECTOMY Right 2014  ? ?Patient Active Problem List  ? Diagnosis Date Noted  ? CKD (chronic kidney disease), stage III ([place])   ? COVID-19 virus infection 12/2020  ? History of right breast cancer 03/13/2020  ? Status post right mastectomy 03/13/2020  ? Abnormal gait 09/15/2018  ? Claustrophobia 09/15/2018  ? Hypertension 09/15/2018  ? Localization-related epilepsy ([place]) 09/15/2018  ? Multiple sclerosis ([place]) 09/15/2018  ? Paresthesia 09/15/2018  ? Pain in lower limb 12/31/2015  ? Seizure ([place]) 12/31/2015  ? Neuropathic pain 06/02/2015  ? No advance directives 11/02/2012  ? Refusal of blood transfusion for reasons of conscience 11/02/2012  ? Malignant neoplasm of upper-outer quadrant of right breast in female, estrogen receptor positive ([place]) 11/01/2012  ? ? ?REFERRING DIAG: G35 (ICD-10-CM) - Multiple sclerosis ([place])  ? ?THERAPY DIAG:  ?Unsteadiness on  feet ? ?Muscle weakness (generalized) ? ?Other abnormalities of gait and mobility ? ?PERTINENT HISTORY: relapsing remitting MS and breast CA and seizures. Pt has MS with LLE wekaness greater than other limbs, with coexisting debility- uses cane to walk has had multiple near falls- eval and treat  ? ?PRECAUTIONS: Fall  ? ?SUBJECTIVE: Pt ambulated into clinic w/L genu recurvatum brace donned, reports her insurance paid for it in full and did not tell her it was coming. Received it roughly an hour ago.  ? ?PAIN:  ?Are you having pain? Yes: NPRS scale: 8/10 ?Pain location: BLEs ?Pain description: nerve pain ?Aggravating factors: none ?Relieving factors: meds  ?OBJECTIVE:  ?  ?TODAY'S TREATMENT:  ?Gait Training ?Gait pattern: step to pattern, decreased arm swing- Left, decreased step length- Right, decreased stance time- Left, decreased hip/knee flexion- Left, Left hip hike, Left foot flat, genu recurvatum- Left, antalgic, lateral lean- Right, trunk rotated posterior- Right, wide BOS, abducted- Left, and poor foot clearance- Left ?Distance walked: Various clinic distances ?Assistive device utilized: Quad cane small base ?Level of assistance: Min A ?Comments: Pt practiced ambulating w/L genu recurvatum brace on. Noted increased posterolateral lean to R side, decreased ER of LLE due to tightness of brace and multiple posterior LOB episodes requiring min A to prevent fall. Pt reported feeling "more unsteady" w/brace on.  ? ?Ther Ex  ?Alternating step downs from 6"  step w/BUE support on rails, x15 per side, for improved eccentric control of bilat quads/hip abductors and proper knee flexion positioning. Pt able to perform w/CGA and noted decreased ER of LLE when stepping down w/LLE. No knee buckling noted throughout.  ? ?Self-care/home management  ?Noted L genu recurvatum brace very tight on pt's leg and therapist unable to slip finger under brace when donned. Brace unable to be adjusted, encouraged pt and daughter to check  pt's skin when they got home to ensure the brace was not damaging pt's skin. Encouraged pt to perform HEP at home with and without the brace donned to continue strengthening LLE. Pt verbalized understanding. Encouraged pt to use rollator rather than [facility] w/knee brace due to increased instability w/brace on.  ? ? ?PATIENT EDUCATION: ?Education details: See self-care section  ?Person educated: Patient and Daughter ?Education method: Explanation and Verbal cues and handout ?Education comprehension: verbalized understanding ?  ?  ?HOME EXERCISE PROGRAM: ?Access Code: [email or web address] ?URL: [email or web address] ?Date: 11/26/2021 ?Prepared by: Mickie Bail Celicia Minahan ? ?Exercises ?- Sit to Stand Without Arm Support  - 1 x daily - 7 x weekly - 3 sets - 10 reps ?- Supine Bridge  - 1 x daily - 7 x weekly - 3 sets - 10 reps ?- Standing March with Counter Support  - 1 x daily - 7 x weekly - 3 sets - 10 reps ? ?  ?GOALS: ?Goals reviewed with patient? Yes ?  ?SHORT TERM GOALS: Target date: 12/03/2021 ?  ?Pt will be IND with HEP in order to indicate improved functional mobility and dec fall risk. ?Baseline: pt is performing HEP routinely ?Goal status: Goal met ?  ?2.  Pt will improve gait speed to >/=1.8 ft/sec w/ LRAD (walker vs cane) in order to indicate dec fall risk. ?  ?Baseline: 0.92 ft/sec with quad tip cane and min/guard ; 0.82 ft/s with SBA and quad tip st cane. 11/24/21 ?Goal status: progressing ?  ?3.  Pt will perform BERG and improve balance score 4 points from baseline in order to indicate dec fall risk.  ?  ?Baseline: 32/56 on 11/10/21; 41/56 11/25/19 ?Goal status: MET ?  ?4.  Pt will ambulate 150' with RW vs rollator at mod I level in order to indicate more independent household ambulation.  ?  ?Baseline:  ?Goal status: INITIAL ?  ?5.  Will assess varying AFOs/KAFOs (AFO w/ knee cage) in order to determine which would be most appropriate to dec fall risk.  ?Baseline:  ?Goal status: INITIAL ?  ?6.  Pt will improve TUG to  </=30 secs w/ LRAD at S level in order to indicate dec fall risk  ?Baseline: 35.78 secs with quad tip cane ?Goal status: INITIAL ?  ?LONG TERM GOALS: Target date: 12/31/2021 ?  ?Pt will be IND with final HEP in order to indicate improved functional mobility and dec fall risk. ?Baseline:  ?Goal status: INITIAL ?  ?2.  Pt will improve gait speed to >/=2.6 ft/sec w/ LRAD in order to indicate dec fall risk. ?  ?Baseline: 0.92 ft/sec with quad tip cane and min/guard; 0.13ft/s with SBA and quad tip st. Cane 11/24/21 ?Goal status: progressing continue ?  ?3.  Pt will negotiate up/down 4 steps with B rails, up/down ramp and curb and ambulate x 500' outdoors over unlevel paved surfaces at S level with LRAD in order to indicate improved community mobility.  ?  ?Baseline:  ?Goal status: INITIAL ?  ?4.  Pt will improve BERG balance score  8 points from baseline in order to indicate dec fall risk.  ?  ?Baseline: 32/56 on 11/10/21; 41/56 11/25/19 ?Goal status: MET ?5.  Pt will improve TUG to </=20 secs w/ LRAD (and brace as indicated) at S level in order to indicate dec fall risk  ?  ?Baseline:  ?Goal status: INITIAL ?  ?  ?  ?ASSESSMENT: ?  ?CLINICAL IMPRESSION: ?Emphasis of skilled PT session on assessing pt's balance and L knee control w/genu recurvatum brace on. Pt demonstrates increased posterior LOB w/use of brace and maintained knee extension throughout stance and swing phase of gait. Pt reports feeling more unstable w/brace on, encouraged pt and daughter to practice walking at home w/rollator and performing HEP with brace donned and doffed for continued quad strengthening. Continue POC.  ?  ?  ?OBJECTIVE IMPAIRMENTS Abnormal gait, decreased activity tolerance, decreased balance, decreased coordination, decreased endurance, decreased knowledge of use of DME, decreased mobility, difficulty walking, decreased ROM, decreased strength, hypomobility, impaired perceived functional ability, impaired flexibility, impaired sensation, and  postural dysfunction.  ?  ?ACTIVITY LIMITATIONS community activity, meal prep, laundry, and shopping.  ?  ?PERSONAL FACTORS Past/current experiences, Time since onset of injury/illness/exacerbation, and 1-2 comorbi

## 2021-12-01 ENCOUNTER — Ambulatory Visit: Payer: Medicare Other | Admitting: Rehabilitation

## 2021-12-01 ENCOUNTER — Encounter: Payer: Self-pay | Admitting: Rehabilitation

## 2021-12-01 DIAGNOSIS — R2689 Other abnormalities of gait and mobility: Secondary | ICD-10-CM | POA: Diagnosis not present

## 2021-12-01 DIAGNOSIS — M6281 Muscle weakness (generalized): Secondary | ICD-10-CM | POA: Diagnosis not present

## 2021-12-01 DIAGNOSIS — R293 Abnormal posture: Secondary | ICD-10-CM

## 2021-12-01 DIAGNOSIS — R2681 Unsteadiness on feet: Secondary | ICD-10-CM

## 2021-12-01 NOTE — Therapy (Signed)
?OUTPATIENT PHYSICAL THERAPY TREATMENT NOTE ? ? ?Patient Name: Selena Bender Junius Creamer ?MRN: [id number] ?DOB:15-Jun-1951, 71 y.o., female ?Today's Date: 12/01/2021 ? ?PCP: Glenis Smoker, MD ?REFERRING PROVIDER: Courtney Heys, MD  ? ? PT End of Session - 12/01/21 1459   ? ? Visit Number 7   ? Number of Visits 17   ? Date for PT Re-Evaluation 01/04/22   ? Authorization Type Medicare/[employer]ign[employer] (needs 10th visit PN)   ? Progress Note Due on Visit 10   ? PT Start Time 3664   pt arrived late  ? PT Stop Time 1530   ? PT Time Calculation (min) 38 min   ? Equipment Utilized During Treatment Other (comment)   Genu recurvatum knee orthosis on LLE  ? Activity Tolerance Patient tolerated treatment well   ? Behavior During Therapy [facility] for tasks assessed/performed   ? ?  ?  ? ?  ? ? ? ? ?Past Medical History:  ?Diagnosis Date  ? Breast cancer ([place]) 2014  ? Right  ? CKD (chronic kidney disease), stage III ([place])   ? COVID-19 virus infection 12/2020  ? Hypertension   ? Multiple sclerosis ([place])   ? Seizures ([place])   ? ?Past Surgical History:  ?Procedure Laterality Date  ? BREAST SURGERY    ? MASTECTOMY Right 2014  ? ?Patient Active Problem List  ? Diagnosis Date Noted  ? CKD (chronic kidney disease), stage III ([place])   ? COVID-19 virus infection 12/2020  ? History of right breast cancer 03/13/2020  ? Status post right mastectomy 03/13/2020  ? Abnormal gait 09/15/2018  ? Claustrophobia 09/15/2018  ? Hypertension 09/15/2018  ? Localization-related epilepsy ([place]) 09/15/2018  ? Multiple sclerosis ([place]) 09/15/2018  ? Paresthesia 09/15/2018  ? Pain in lower limb 12/31/2015  ? Seizure ([place]) 12/31/2015  ? Neuropathic pain 06/02/2015  ? No advance directives 11/02/2012  ? Refusal of blood transfusion for reasons of conscience 11/02/2012  ? Malignant neoplasm of upper-outer quadrant of right breast in female, estrogen receptor positive ([place]) 11/01/2012  ? ? ?REFERRING DIAG: G35 (ICD-10-CM) - Multiple sclerosis ([place])  ? ?THERAPY DIAG:  ?Unsteadiness  on feet ? ?Muscle weakness (generalized) ? ?Other abnormalities of gait and mobility ? ?Abnormal posture ? ?PERTINENT HISTORY: relapsing remitting MS and breast CA and seizures. Pt has MS with LLE wekaness greater than other limbs, with coexisting debility- uses cane to walk has had multiple near falls ? ?PRECAUTIONS: Fall  ? ?SUBJECTIVE: Pt ambulated into clinic w/L genu recurvatum brace not donned, daughter assisting her with use of cane and carrying brace.  ? ?PAIN:  ?Are you having pain? Yes: NPRS scale: 7/10 ?Pain location: BLEs ?Pain description: nerve pain ?Aggravating factors: none ?Relieving factors: meds  ?OBJECTIVE:  ?  ?TODAY'S TREATMENT:  ?Gait Training ?Gait pattern: step to pattern, decreased arm swing- Left, decreased step length- Right, decreased stance time- Left, decreased hip/knee flexion- Left, Left hip hike, Left foot flat, genu recurvatum- Left, antalgic, lateral lean- Right, trunk rotated posterior- Right, wide BOS, abducted- Left, and poor foot clearance- Left ?Distance walked: Various clinic distances ?Assistive device utilized:SPC with quad tip  ?Level of assistance: Min A ?Comments: Continued to practice ambulating w/L genu recurvatum brace on with use of SPC with quad tip. Continues to demonstrate increased posterolateral lean to R side, decreased ER of LLE due to tightness of brace and multiple posterior LOB episodes requiring min A to prevent fall. Then removed knee brace and assessed other options of AFOs with heel wedge to see  if this would help with clearance of LLE as well as keep knee from going into hyperextension.  Trailed use of both [place] reaction AFO and [place] PLS brace, however neither assisted with reducing knee hyperextension.  PT redonned knee brace and had her ambulate x 2 more bouts with cane at min A level with PT providing facilitation for improved L lateral weight shift forward over LLE during stance for increased R step length and for improved posture.  She is  unable to maintain any of these corrections without PT facilitation.  At end of session, PT had her ambulate with knee brace and RW.  She was [facility] safer with this device and feel that this would be a great options for her be safer and more independent at home and especially in community.  She did mention being interested in the up walker, however I am not sure that this is best option and also it is very cumbersome.  We could trial this in future session if wanted.   ? ? ?Self-care/home management  ?Continue to discuss with her and daughter what best option may be for bracing.  Nothing we had in clinic really assisted and due to her extreme weakness in LLE, PT feels that custom bracing may be best option.  PT to call and set up appt for orthotist to attend future session.    ? ? ?PATIENT EDUCATION: ?Education details: See self-care section  ?Person educated: Patient and Daughter ?Education method: Explanation and Verbal cues and handout ?Education comprehension: verbalized understanding ?  ?  ?HOME EXERCISE PROGRAM: ?Access Code: [email or web address] ?URL: [email or web address] ?Date: 11/26/2021 ?Prepared by: Mickie Bail Plaster ? ?Exercises ?- Sit to Stand Without Arm Support  - 1 x daily - 7 x weekly - 3 sets - 10 reps ?- Supine Bridge  - 1 x daily - 7 x weekly - 3 sets - 10 reps ?- Standing March with Counter Support  - 1 x daily - 7 x weekly - 3 sets - 10 reps ? ?  ?GOALS: ?Goals reviewed with patient? Yes ?  ?SHORT TERM GOALS: Target date: 12/03/2021 ?  ?Pt will be IND with HEP in order to indicate improved functional mobility and dec fall risk. ?Baseline: pt is performing HEP routinely ?Goal status: Goal met ?  ?2.  Pt will improve gait speed to >/=1.8 ft/sec w/ LRAD (walker vs cane) in order to indicate dec fall risk. ?  ?Baseline: 0.92 ft/sec with quad tip cane and min/guard ; 0.82 ft/s with SBA and quad tip st cane. 11/24/21 ?Goal status: progressing ?  ?3.  Pt will perform BERG and improve balance score 4  points from baseline in order to indicate dec fall risk.  ?  ?Baseline: 32/56 on 11/10/21; 41/56 11/25/19 ?Goal status: MET ?  ?4.  Pt will ambulate 150' with RW vs rollator at mod I level in order to indicate more independent household ambulation.  ?  ?Baseline:  ?Goal status: Did not do 150' in session, but is S with RW in clinic with knee brace  ?  ?5.  Will assess varying AFOs/KAFOs (AFO w/ knee cage) in order to determine which would be most appropriate to dec fall risk.  ?Baseline:  ?Goal status: progressing, but feel that she will likely need custom AFO ?  ?6.  Pt will improve TUG to </=30 secs w/ LRAD at S level in order to indicate dec fall risk  ?Baseline: 35.78 secs with quad tip cane ?Goal status: INITIAL ?  ?LONG  TERM GOALS: Target date: 12/31/2021 ?  ?Pt will be IND with final HEP in order to indicate improved functional mobility and dec fall risk. ?Baseline:  ?Goal status: INITIAL ?  ?2.  Pt will improve gait speed to >/=2.6 ft/sec w/ LRAD in order to indicate dec fall risk. ?  ?Baseline: 0.92 ft/sec with quad tip cane and min/guard; 0.57ft/s with SBA and quad tip st. Cane 11/24/21 ?Goal status: progressing continue ?  ?3.  Pt will negotiate up/down 4 steps with B rails, up/down ramp and curb and ambulate x 500' outdoors over unlevel paved surfaces at S level with LRAD in order to indicate improved community mobility.  ?  ?Baseline:  ?Goal status: INITIAL ?  ?4.  Pt will improve BERG balance score 8 points from baseline in order to indicate dec fall risk.  ?  ?Baseline: 32/56 on 11/10/21; 41/56 11/25/19 ?Goal status: MET ?5.  Pt will improve TUG to </=20 secs w/ LRAD (and brace as indicated) at S level in order to indicate dec fall risk  ?  ?Baseline:  ?Goal status: INITIAL ?  ?  ?  ?ASSESSMENT: ?  ?CLINICAL IMPRESSION: ?Skilled session continues to work on assessment for best option for bracing in LLE whether it be knee brace vs AFO.  Feel that she may need custom AFO to control both foot clearance and knee  recurvatum.  We did trial use of RW in session today and this seemed to be much safer option and pt verbalizing she feels safer.  Will continue to address and recommend purchase if appropriate.  Pt making

## 2021-12-03 ENCOUNTER — Ambulatory Visit: Payer: Medicare Other | Admitting: Physical Therapy

## 2021-12-03 NOTE — Therapy (Incomplete)
?OUTPATIENT PHYSICAL THERAPY TREATMENT NOTE ? ? ?Patient Name: Selena Bender ?MRN: [id number] ?DOB:November 01, 1950, 71 y.o., female ?Today's Date: 12/03/2021 ? ?PCP: Glenis Smoker, MD ?REFERRING PROVIDER: Courtney Heys, MD  ? ? ? ? ? ? ?Past Medical History:  ?Diagnosis Date  ? Breast cancer ([place]) 2014  ? Right  ? CKD (chronic kidney disease), stage III ([place])   ? COVID-19 virus infection 12/2020  ? Hypertension   ? Multiple sclerosis ([place])   ? Seizures ([place])   ? ?Past Surgical History:  ?Procedure Laterality Date  ? BREAST SURGERY    ? MASTECTOMY Right 2014  ? ?Patient Active Problem List  ? Diagnosis Date Noted  ? CKD (chronic kidney disease), stage III ([place])   ? COVID-19 virus infection 12/2020  ? History of right breast cancer 03/13/2020  ? Status post right mastectomy 03/13/2020  ? Abnormal gait 09/15/2018  ? Claustrophobia 09/15/2018  ? Hypertension 09/15/2018  ? Localization-related epilepsy ([place]) 09/15/2018  ? Multiple sclerosis ([place]) 09/15/2018  ? Paresthesia 09/15/2018  ? Pain in lower limb 12/31/2015  ? Seizure ([place]) 12/31/2015  ? Neuropathic pain 06/02/2015  ? No advance directives 11/02/2012  ? Refusal of blood transfusion for reasons of conscience 11/02/2012  ? Malignant neoplasm of upper-outer quadrant of right breast in female, estrogen receptor positive ([place]) 11/01/2012  ? ? ?REFERRING DIAG: G35 (ICD-10-CM) - Multiple sclerosis ([place])  ? ?THERAPY DIAG:  ?No diagnosis found. ? ?PERTINENT HISTORY: relapsing remitting MS and breast CA and seizures. Pt has MS with LLE wekaness greater than other limbs, with coexisting debility- uses cane to walk has had multiple near falls ? ?PRECAUTIONS: Fall  ? ?SUBJECTIVE: Pt ambulated into clinic w/L genu recurvatum brace not donned, daughter assisting her with use of cane and carrying brace.  ? ?PAIN:  ?Are you having pain? Yes: NPRS scale: 7/10 ?Pain location: BLEs ?Pain description: nerve pain ?Aggravating factors: none ?Relieving factors: meds  ?OBJECTIVE:   ?  ?TODAY'S TREATMENT:  ?Gait Training ?Gait pattern: step to pattern, decreased arm swing- Left, decreased step length- Right, decreased stance time- Left, decreased hip/knee flexion- Left, Left hip hike, Left foot flat, genu recurvatum- Left, antalgic, lateral lean- Right, trunk rotated posterior- Right, wide BOS, abducted- Left, and poor foot clearance- Left ?Distance walked: Various clinic distances ?Assistive device utilized:SPC with quad tip  ?Level of assistance: Min A ?Comments: Continued to practice ambulating w/L genu recurvatum brace on with use of SPC with quad tip. Continues to demonstrate increased posterolateral lean to R side, decreased ER of LLE due to tightness of brace and multiple posterior LOB episodes requiring min A to prevent fall. Then removed knee brace and assessed other options of AFOs with heel wedge to see if this would help with clearance of LLE as well as keep knee from going into hyperextension.  Trailed use of both [place] reaction AFO and [place] PLS brace, however neither assisted with reducing knee hyperextension.  PT redonned knee brace and had her ambulate x 2 more bouts with cane at min A level with PT providing facilitation for improved L lateral weight shift forward over LLE during stance for increased R step length and for improved posture.  She is unable to maintain any of these corrections without PT facilitation.  At end of session, PT had her ambulate with knee brace and RW.  She was [facility] safer with this device and feel that this would be a great options for her be safer and more independent at home and especially  in community.  She did mention being interested in the up walker, however I am not sure that this is best option and also it is very cumbersome.  We could trial this in future session if wanted.   ? ? ?Self-care/home management  ?Continue to discuss with her and daughter what best option may be for bracing.  Nothing we had in clinic really assisted and due  to her extreme weakness in LLE, PT feels that custom bracing may be best option.  PT to call and set up appt for orthotist to attend future session.    ? ? ?PATIENT EDUCATION: ?Education details: See self-care section  ?Person educated: Patient and Daughter ?Education method: Explanation and Verbal cues and handout ?Education comprehension: verbalized understanding ?  ?  ?HOME EXERCISE PROGRAM: ?Access Code: [email or web address] ?URL: [email or web address] ?Date: 11/26/2021 ?Prepared by: Mickie Bail Jaxsin Bottomley ? ?Exercises ?- Sit to Stand Without Arm Support  - 1 x daily - 7 x weekly - 3 sets - 10 reps ?- Supine Bridge  - 1 x daily - 7 x weekly - 3 sets - 10 reps ?- Standing March with Counter Support  - 1 x daily - 7 x weekly - 3 sets - 10 reps ? ?  ?GOALS: ?Goals reviewed with patient? Yes ?  ?SHORT TERM GOALS: Target date: 12/03/2021 ?  ?Pt will be IND with HEP in order to indicate improved functional mobility and dec fall risk. ?Baseline: pt is performing HEP routinely ?Goal status: Goal met ?  ?2.  Pt will improve gait speed to >/=1.8 ft/sec w/ LRAD (walker vs cane) in order to indicate dec fall risk. ?  ?Baseline: 0.92 ft/sec with quad tip cane and min/guard ; 0.82 ft/s with SBA and quad tip st cane. 11/24/21 ?Goal status: progressing ?  ?3.  Pt will perform BERG and improve balance score 4 points from baseline in order to indicate dec fall risk.  ?  ?Baseline: 32/56 on 11/10/21; 41/56 11/25/19 ?Goal status: MET ?  ?4.  Pt will ambulate 150' with RW vs rollator at mod I level in order to indicate more independent household ambulation.  ?  ?Baseline:  ?Goal status: Did not do 150' in session, but is S with RW in clinic with knee brace  ?  ?5.  Will assess varying AFOs/KAFOs (AFO w/ knee cage) in order to determine which would be most appropriate to dec fall risk.  ?Baseline:  ?Goal status: progressing, but feel that she will likely need custom AFO ?  ?6.  Pt will improve TUG to </=30 secs w/ LRAD at S level in order to  indicate dec fall risk  ?Baseline: 35.78 secs with quad tip cane ?Goal status: INITIAL ?  ?LONG TERM GOALS: Target date: 12/31/2021 ?  ?Pt will be IND with final HEP in order to indicate improved functional mobility and dec fall risk. ?Baseline:  ?Goal status: INITIAL ?  ?2.  Pt will improve gait speed to >/=2.6 ft/sec w/ LRAD in order to indicate dec fall risk. ?  ?Baseline: 0.92 ft/sec with quad tip cane and min/guard; 0.71ft/s with SBA and quad tip st. Cane 11/24/21 ?Goal status: progressing continue ?  ?3.  Pt will negotiate up/down 4 steps with B rails, up/down ramp and curb and ambulate x 500' outdoors over unlevel paved surfaces at S level with LRAD in order to indicate improved community mobility.  ?  ?Baseline:  ?Goal status: INITIAL ?  ?4.  Pt will improve BERG balance score 8 points from  baseline in order to indicate dec fall risk.  ?  ?Baseline: 32/56 on 11/10/21; 41/56 11/25/19 ?Goal status: MET ?5.  Pt will improve TUG to </=20 secs w/ LRAD (and brace as indicated) at S level in order to indicate dec fall risk  ?  ?Baseline:  ?Goal status: INITIAL ?  ?  ?  ?ASSESSMENT: ?  ?CLINICAL IMPRESSION: ?Skilled session continues to work on assessment for best option for bracing in LLE whether it be knee brace vs AFO.  Feel that she may need custom AFO to control both foot clearance and knee recurvatum.  We did trial use of RW in session today and this seemed to be much safer option and pt verbalizing she feels safer.  Will continue to address and recommend purchase if appropriate.  Pt making progress with STGs and will assess TUG at next session.  ?  ?  ?OBJECTIVE IMPAIRMENTS Abnormal gait, decreased activity tolerance, decreased balance, decreased coordination, decreased endurance, decreased knowledge of use of DME, decreased mobility, difficulty walking, decreased ROM, decreased strength, hypomobility, impaired perceived functional ability, impaired flexibility, impaired sensation, and postural dysfunction.  ?   ?ACTIVITY LIMITATIONS community activity, meal prep, laundry, and shopping.  ?  ?PERSONAL FACTORS Past/current experiences, Time since onset of injury/illness/exacerbation, and 1-2 comorbidities: see abov

## 2021-12-04 ENCOUNTER — Telehealth: Payer: Self-pay | Admitting: Physical Medicine and Rehabilitation

## 2021-12-04 NOTE — Telephone Encounter (Signed)
Patient would like a phone call to see if medication can be changed. ?

## 2021-12-08 ENCOUNTER — Ambulatory Visit: Payer: Medicare Other | Attending: Family Medicine | Admitting: Physical Therapy

## 2021-12-08 ENCOUNTER — Ambulatory Visit: Payer: Medicare Other | Admitting: Rehabilitation

## 2021-12-08 DIAGNOSIS — M6281 Muscle weakness (generalized): Secondary | ICD-10-CM | POA: Insufficient documentation

## 2021-12-08 DIAGNOSIS — R2681 Unsteadiness on feet: Secondary | ICD-10-CM | POA: Insufficient documentation

## 2021-12-08 DIAGNOSIS — R293 Abnormal posture: Secondary | ICD-10-CM | POA: Insufficient documentation

## 2021-12-08 DIAGNOSIS — R2689 Other abnormalities of gait and mobility: Secondary | ICD-10-CM | POA: Diagnosis not present

## 2021-12-08 NOTE — Therapy (Signed)
?OUTPATIENT PHYSICAL THERAPY TREATMENT NOTE ? ? ?Patient Name: Selena Bender ?MRN: [id number] ?DOB:08-29-1950, 71 y.o., female ?Today's Date: 12/08/2021 ? ?PCP: Glenis Smoker, MD ?REFERRING PROVIDER: Courtney Heys, MD  ? ? PT End of Session - 12/08/21 1403   ? ? Visit Number 8   ? Number of Visits 17   ? Date for PT Re-Evaluation 01/04/22   ? Authorization Type Medicare/[employer] (needs 10th visit PN)   ? Progress Note Due on Visit 10   ? PT Start Time 1400   ? PT Stop Time 4765   ? PT Time Calculation (min) 47 min   ? Equipment Utilized During Treatment Other (comment)   Genu recurvatum knee orthosis on LLE  ? Activity Tolerance Patient tolerated treatment well   ? Behavior During Therapy [facility] for tasks assessed/performed   ? ?  ?  ? ?  ? ? ? ? ? ?Past Medical History:  ?Diagnosis Date  ? Breast cancer ([place]) 2014  ? Right  ? CKD (chronic kidney disease), stage III ([place])   ? COVID-19 virus infection 12/2020  ? Hypertension   ? Multiple sclerosis ([place])   ? Seizures ([place])   ? ?Past Surgical History:  ?Procedure Laterality Date  ? BREAST SURGERY    ? MASTECTOMY Right 2014  ? ?Patient Active Problem List  ? Diagnosis Date Noted  ? CKD (chronic kidney disease), stage III ([place])   ? COVID-19 virus infection 12/2020  ? History of right breast cancer 03/13/2020  ? Status post right mastectomy 03/13/2020  ? Abnormal gait 09/15/2018  ? Claustrophobia 09/15/2018  ? Hypertension 09/15/2018  ? Localization-related epilepsy ([place]) 09/15/2018  ? Multiple sclerosis ([place]) 09/15/2018  ? Paresthesia 09/15/2018  ? Pain in lower limb 12/31/2015  ? Seizure ([place]) 12/31/2015  ? Neuropathic pain 06/02/2015  ? No advance directives 11/02/2012  ? Refusal of blood transfusion for reasons of conscience 11/02/2012  ? Malignant neoplasm of upper-outer quadrant of right breast in female, estrogen receptor positive ([place]) 11/01/2012  ? ? ?REFERRING DIAG: G35 (ICD-10-CM) - Multiple sclerosis ([place])  ? ?THERAPY DIAG:  ?Unsteadiness on  feet ? ?Other abnormalities of gait and mobility ? ?Muscle weakness (generalized) ? ?PERTINENT HISTORY: relapsing remitting MS and breast CA and seizures. Pt has MS with LLE wekaness greater than other limbs, with coexisting debility- uses cane to walk has had multiple near falls ? ?PRECAUTIONS: Fall  ? ?SUBJECTIVE: Pt ambulated into clinic w/L genu recurvatum brace not donned and using SPC w/quad tip. Reports having major increase in pain recently, has contacted Dr. Dagoberto Ligas but has not heard back regarding changing her meds as pt feels naltrexone is not helping.  ? ?PAIN:  ?Are you having pain? Yes: NPRS scale: 7/10 ?Pain location: BLEs ?Pain description: nerve pain ?Aggravating factors: none ?Relieving factors: meds  ?OBJECTIVE:  ?  ?TODAY'S TREATMENT:  ?Ther Act  ?TUG results for STG assessment:  ?-Trial 1: 33.32s w/RW and no brace. Poor sit <>stand technique noted as pt pulled up on RW. Noted significant ER of RLE and poor AD management, especially during turn, as pt stepped outside of RW and tripped over walker w/LLE. CGA throughout. Provided lengthy discussion on proper AD management and mechanics for sit <>stand following trial.  ?-Trial 2: 29.94s w/RW and no brace. Noted improvement with AD management but continued imbalance w/turns. CGA provided  ?-Trial 3: 33.66s w/RW and genu recurvatum brace on LLE. Noted continued hand placement on RW w/sit <>stand despite cues and poor turning management. CGA. ?-  Trial 4: 28.82s w/rollator and genu recurvatum brace donned. Noted improved safety w/turns, proper sit <>stand technique (pt required 2 attempts to stand) and improved midline orientation in sagittal plane. S* throughout   ? ?Gait pattern:  ER of LLE, step through pattern, Right foot flat, Left foot flat, genu recurvatum- Left, wide BOS, abducted- Left, and poor foot clearance- Left ?Distance walked: 49' ?Assistive device utilized: Environmental consultant - 4 wheeled ?Level of assistance: SBA ?Comments: Practiced ambulating  w/rollator and genu recurvatum brace. Noted improved midline orientation in sagittal plane and reduced ER of LLE. No posterior or lateral LOB noted.  ? ?Self-care/home management  ?Continued discussion of bracing options and contacting Dr. Dagoberto Ligas regarding medication change. Encouraged pt to use rollator as primary AD at this time due to increased stability and safety compared to using SPC. Pt verbalized understanding. Discussed results of TUG assessment, proper sit <>stand technique and safe management of rollator (brakes).  ? ? ?PATIENT EDUCATION: ?Education details: See self-care section  ?Person educated: Patient and Aunt  ?Education method: Explanation and Verbal cues and handout ?Education comprehension: verbalized understanding ?  ?  ?HOME EXERCISE PROGRAM: ?Access Code: [email or web address] ?URL: [email or web address] ?Date: 11/26/2021 ?Prepared by: Mickie Bail Shirelle Tootle ? ?Exercises ?- Sit to Stand Without Arm Support  - 1 x daily - 7 x weekly - 3 sets - 10 reps ?- Supine Bridge  - 1 x daily - 7 x weekly - 3 sets - 10 reps ?- Standing March with Counter Support  - 1 x daily - 7 x weekly - 3 sets - 10 reps ? ?  ?GOALS: ?Goals reviewed with patient? Yes ?  ?SHORT TERM GOALS: Target date: 12/03/2021 ?  ?Pt will be IND with HEP in order to indicate improved functional mobility and dec fall risk. ?Baseline: pt is performing HEP routinely ?Goal status: Goal met ?  ?2.  Pt will improve gait speed to >/=1.8 ft/sec w/ LRAD (walker vs cane) in order to indicate dec fall risk. ?  ?Baseline: 0.92 ft/sec with quad tip cane and min/guard ; 0.82 ft/s with SBA and quad tip st cane. 11/24/21 ?Goal status: progressing ?  ?3.  Pt will perform BERG and improve balance score 4 points from baseline in order to indicate dec fall risk.  ?  ?Baseline: 32/56 on 11/10/21; 41/56 11/25/19 ?Goal status: MET ?  ?4.  Pt will ambulate 150' with RW vs rollator at mod I level in order to indicate more independent household ambulation.  ?  ?Baseline:   ?Goal status: Did not do 150' in session, but is S with RW in clinic with knee brace  ?  ?5.  Will assess varying AFOs/KAFOs (AFO w/ knee cage) in order to determine which would be most appropriate to dec fall risk.  ?Baseline:  ?Goal status: progressing, but feel that she will likely need custom AFO ?  ?6.  Pt will improve TUG to </=30 secs w/ LRAD at S level in order to indicate dec fall risk  ?Baseline: 35.78 secs with quad tip cane; 28.82s w/rollator and genu recurvatum brace on LLE.  ?Goal status: MET ?  ?LONG TERM GOALS: Target date: 12/31/2021 ?  ?Pt will be IND with final HEP in order to indicate improved functional mobility and dec fall risk. ?Baseline:  ?Goal status: INITIAL ?  ?2.  Pt will improve gait speed to >/=2.6 ft/sec w/ LRAD in order to indicate dec fall risk. ?  ?Baseline: 0.92 ft/sec with quad tip cane and min/guard; 0.49f/s with SBA and  quad tip st. Cane 11/24/21 ?Goal status: progressing continue ?  ?3.  Pt will negotiate up/down 4 steps with B rails, up/down ramp and curb and ambulate x 500' outdoors over unlevel paved surfaces at S level with LRAD in order to indicate improved community mobility.  ?  ?Baseline:  ?Goal status: INITIAL ?  ?4.  Pt will improve BERG balance score 8 points from baseline in order to indicate dec fall risk.  ?  ?Baseline: 32/56 on 11/10/21; 41/56 11/25/19 ?Goal status: MET ?5.  Pt will improve TUG to </=20 secs w/ LRAD (and brace as indicated) at S level in order to indicate dec fall risk  ?  ?Baseline:  ?Goal status: INITIAL ?  ?  ?  ?ASSESSMENT: ?  ?CLINICAL IMPRESSION: ?Emphasis of skilled PT session on finishing STG assessment and determining safest AD for pt to use at home. Pt met her TUG goal using rollator and genu recurvatum brace. Recommended pt use rollator as primary AD at this time due to significant improvement in stability/balance compared to her cane and difficulty managing a RW. Pt verbalized understanding. Continue POC.   ?  ?  ?OBJECTIVE IMPAIRMENTS  Abnormal gait, decreased activity tolerance, decreased balance, decreased coordination, decreased endurance, decreased knowledge of use of DME, decreased mobility, difficulty walking, decreased ROM, decreased strengt

## 2021-12-09 ENCOUNTER — Telehealth: Payer: Self-pay | Admitting: Rehabilitation

## 2021-12-09 NOTE — Telephone Encounter (Signed)
Dr. Dagoberto Ligas,  ? ?Hey there! I am seeing Ms. Arlayne [name] here at [facility] for PT.  We are trialing different AFOs with her but I feel like she will likely need a custom AFO to help with LLE ankle weakness but also to help control knee recurvatum.  If you wouldn't mind, could you place an order in epic for L AFO/KAFO?? ? ?She will also need a face to face to get this brace ordered.  You did see her the end of March and you mentioned LLE weakness with gait, however I am wondering if you would addend it to say she would benefit from L AFO/KAFO due to LLE weakness.  If not, I can have her call your office to set up a face to face, just let me know.  ? ?Cameron Sprang, PT, MPT ?[facility] ?[place], [place], [address] ?Phone: [phone]   Fax:  [phone] ?12/09/21, 12:58 PM ? ?

## 2021-12-10 ENCOUNTER — Ambulatory Visit: Payer: Medicare Other | Admitting: Physical Therapy

## 2021-12-10 DIAGNOSIS — R293 Abnormal posture: Secondary | ICD-10-CM | POA: Diagnosis not present

## 2021-12-10 DIAGNOSIS — R2689 Other abnormalities of gait and mobility: Secondary | ICD-10-CM | POA: Diagnosis not present

## 2021-12-10 DIAGNOSIS — M6281 Muscle weakness (generalized): Secondary | ICD-10-CM

## 2021-12-10 DIAGNOSIS — R2681 Unsteadiness on feet: Secondary | ICD-10-CM

## 2021-12-10 NOTE — Therapy (Signed)
?OUTPATIENT PHYSICAL THERAPY TREATMENT NOTE ? ? ?Patient Name: Selena Bender ?MRN: [id number] ?DOB:[birth year], 71 y.o., female ?Today's Date: 12/10/2021 ? ?PCP: Glenis Smoker, MD ?REFERRING PROVIDER: Courtney Heys, MD  ? ? PT End of Session - 12/10/21 1538   ? ? Visit Number 9   ? Number of Visits 17   ? Date for PT Re-Evaluation 01/04/22   ? Authorization Type Medicare/[employer] (needs 10th visit PN)   ? Progress Note Due on Visit 10   ? PT Start Time 1536   Pt arrived late  ? PT Stop Time 2637   ? PT Time Calculation (min) 41 min   ? Equipment Utilized During Treatment Other (comment)   Genu recurvatum knee orthosis on LLE and L Thuasne AFO  ? Activity Tolerance Patient tolerated treatment well   ? Behavior During Therapy [facility] for tasks assessed/performed   ? ?  ?  ? ?  ? ? ? ? ? ?Past Medical History:  ?Diagnosis Date  ? Breast cancer ([place]) 2014  ? Right  ? CKD (chronic kidney disease), stage III ([place])   ? COVID-19 virus infection 12/2020  ? Hypertension   ? Multiple sclerosis ([place])   ? Seizures ([place])   ? ?Past Surgical History:  ?Procedure Laterality Date  ? BREAST SURGERY    ? MASTECTOMY Right 2014  ? ?Patient Active Problem List  ? Diagnosis Date Noted  ? CKD (chronic kidney disease), stage III ([place])   ? COVID-19 virus infection 12/2020  ? History of right breast cancer 03/13/2020  ? Status post right mastectomy 03/13/2020  ? Abnormal gait 09/15/2018  ? Claustrophobia 09/15/2018  ? Hypertension 09/15/2018  ? Localization-related epilepsy ([place]) 09/15/2018  ? Multiple sclerosis ([place]) 09/15/2018  ? Paresthesia 09/15/2018  ? Pain in lower limb 12/31/2015  ? Seizure ([place]) 12/31/2015  ? Neuropathic pain 06/02/2015  ? No advance directives 11/02/2012  ? Refusal of blood transfusion for reasons of conscience 11/02/2012  ? Malignant neoplasm of upper-outer quadrant of right breast in female, estrogen receptor positive ([place]) 11/01/2012  ? ? ?REFERRING DIAG: G35 (ICD-10-CM) - Multiple sclerosis ([place])  ? ?THERAPY  DIAG:  ?Unsteadiness on feet ? ?Muscle weakness (generalized) ? ?Other abnormalities of gait and mobility ? ?PERTINENT HISTORY: relapsing remitting MS and breast CA and seizures. Pt has MS with LLE wekaness greater than other limbs, with coexisting debility- uses cane to walk has had multiple near falls ? ?PRECAUTIONS: Fall  ? ?SUBJECTIVE: Pt ambulated into clinic w/old L genu recurvatum brace on and rollator. Noted pt much more stable w/old recurvatum brace.  ? ?PAIN:  ?Are you having pain? Yes: NPRS scale: 5/10 ?Pain location: BLEs ?Pain description: nerve pain ?Aggravating factors: none ?Relieving factors: meds  ?OBJECTIVE:  ?  ?TODAY'S TREATMENT:  ?Gait Training  ?Gait pattern:  ER of LLE, step through pattern, decreased step length- Left, decreased hip/knee flexion- Left, decreased ankle dorsiflexion- Left, Right foot flat, Left foot flat, genu recurvatum- Left, wide BOS, abducted- Left, and poor foot clearance- Left ?Distance walked: 18' ?Assistive device utilized: Environmental consultant - 4 wheeled and pt's old genu recurvatum brace on LLE ?Level of assistance: SBA ?Comments: Practiced ambulating w/rollator and genu recurvatum brace. Noted occasional catching of L foot and one minor anterior LOB.  ? ?Gait pattern: step through pattern, decreased step length- Left, decreased hip/knee flexion- Left, decreased ankle dorsiflexion- Left, circumduction- Left, Left foot flat, genu recurvatum- Left, trunk rotated posterior- Left, abducted- Left, and poor foot clearance- Left ?Distance walked: >300' indoors,  200' outside on sidewalk ?Assistive device utilized: Environmental consultant - 4 wheeled and pt's old L genu recurvatum brace and L Thuasne AFO  ?Level of assistance: Modified independence ?Comments: Added L Thuasne AFO w/genu recurvatum brace and noted significant reduction in ER of LLE and increased step clearance. Trialed gait outside to provide auditory feedback on decreased step clearance of LLE for pt and min cues to "march knee forward"  for increased clearance. Pt demonstrated ability to increase step clearance as she heard her foot scuff outside and had one minor posterior LOB while ambulating on incline. Pt very satisfied with AFO/recurvatum brace combo and stated she had not "walked this well in years"  ? ? ?PATIENT EDUCATION: ?Education details: [facility] appointment on 5/16  ?Person educated: Patient  ?Education method: Explanation  ?Education comprehension: verbalized understanding ?  ?  ?HOME EXERCISE PROGRAM: ?Access Code: [email or web address] ?URL: [email or web address] ?Date: 11/26/2021 ?Prepared by: Mickie Bail Tonnie Friedel ? ?Exercises ?- Sit to Stand Without Arm Support  - 1 x daily - 7 x weekly - 3 sets - 10 reps ?- Supine Bridge  - 1 x daily - 7 x weekly - 3 sets - 10 reps ?- Standing March with Counter Support  - 1 x daily - 7 x weekly - 3 sets - 10 reps ? ?  ?GOALS: ?Goals reviewed with patient? Yes ?  ?SHORT TERM GOALS: Target date: 12/03/2021 ?  ?Pt will be IND with HEP in order to indicate improved functional mobility and dec fall risk. ?Baseline: pt is performing HEP routinely ?Goal status: Goal met ?  ?2.  Pt will improve gait speed to >/=1.8 ft/sec w/ LRAD (walker vs cane) in order to indicate dec fall risk. ?  ?Baseline: 0.92 ft/sec with quad tip cane and min/guard ; 0.82 ft/s with SBA and quad tip st cane. 11/24/21 ?Goal status: progressing ?  ?3.  Pt will perform BERG and improve balance score 4 points from baseline in order to indicate dec fall risk.  ?  ?Baseline: 32/56 on 11/10/21; 41/56 11/25/19 ?Goal status: MET ?  ?4.  Pt will ambulate 150' with RW vs rollator at mod I level in order to indicate more independent household ambulation.  ?  ?Baseline:  ?Goal status: Did not do 150' in session, but is S with RW in clinic with knee brace  ?  ?5.  Will assess varying AFOs/KAFOs (AFO w/ knee cage) in order to determine which would be most appropriate to dec fall risk.  ?Baseline:  ?Goal status: progressing, but feel that she will likely  need custom AFO ?  ?6.  Pt will improve TUG to </=30 secs w/ LRAD at S level in order to indicate dec fall risk  ?Baseline: 35.78 secs with quad tip cane; 28.82s w/rollator and genu recurvatum brace on LLE.  ?Goal status: MET ?  ?LONG TERM GOALS: Target date: 12/31/2021 ?  ?Pt will be IND with final HEP in order to indicate improved functional mobility and dec fall risk. ?Baseline:  ?Goal status: INITIAL ?  ?2.  Pt will improve gait speed to >/=2.6 ft/sec w/ LRAD in order to indicate dec fall risk. ?  ?Baseline: 0.92 ft/sec with quad tip cane and min/guard; 0.72ft/s with SBA and quad tip st. Cane 11/24/21 ?Goal status: progressing continue ?  ?3.  Pt will negotiate up/down 4 steps with B rails, up/down ramp and curb and ambulate x 500' outdoors over unlevel paved surfaces at S level with LRAD in order to indicate improved community mobility.  ?  ?  Baseline:  ?Goal status: INITIAL ?  ?4.  Pt will improve BERG balance score 8 points from baseline in order to indicate dec fall risk.  ?  ?Baseline: 32/56 on 11/10/21; 41/56 11/25/19 ?Goal status: MET ?5.  Pt will improve TUG to </=20 secs w/ LRAD (and brace as indicated) at S level in order to indicate dec fall risk  ?  ?Baseline:  ?Goal status: INITIAL ?  ?  ?  ?ASSESSMENT: ?  ?CLINICAL IMPRESSION: ?Emphasis of skilled PT session on gait training w/pt's old genu recurvatum brace and Thuasne AFO on LLE. Noted significant reduction in ER of LLE w/both orthotics donned and pt smiling throughout session. Pt reports feeling much more stable on her w/her old recurvatum brace compared to her new one, encouraged her to wear the old one from now on. Informed pt of [facility] appointment on 5/16 w/[facility] for a custom AFO. Continue POC.  ?  ?  ?OBJECTIVE IMPAIRMENTS Abnormal gait, decreased activity tolerance, decreased balance, decreased coordination, decreased endurance, decreased knowledge of use of DME, decreased mobility, difficulty walking, decreased ROM, decreased strength,  hypomobility, impaired perceived functional ability, impaired flexibility, impaired sensation, and postural dysfunction.  ?  ?ACTIVITY LIMITATIONS community activity, meal prep, laundry, and shopping.  ?  ?PERSONAL

## 2021-12-15 ENCOUNTER — Ambulatory Visit: Payer: Medicare Other | Admitting: Rehabilitation

## 2021-12-15 DIAGNOSIS — R2681 Unsteadiness on feet: Secondary | ICD-10-CM | POA: Diagnosis not present

## 2021-12-15 DIAGNOSIS — R293 Abnormal posture: Secondary | ICD-10-CM

## 2021-12-15 DIAGNOSIS — R2689 Other abnormalities of gait and mobility: Secondary | ICD-10-CM | POA: Diagnosis not present

## 2021-12-15 DIAGNOSIS — M6281 Muscle weakness (generalized): Secondary | ICD-10-CM

## 2021-12-15 NOTE — Therapy (Signed)
?OUTPATIENT PHYSICAL THERAPY TREATMENT NOTE ? ? ?Patient Name: Selena Bender ?MRN: [id number] ?DOB:Oct 28, 1950, 71 y.o., female ?Today's Date: 12/15/2021 ? ?PCP: Glenis Smoker, MD ?REFERRING PROVIDER: Courtney Heys, MD  ? ? PT End of Session - 12/15/21 1422   ? ? Visit Number 10   ? Number of Visits 17   ? Date for PT Re-Evaluation 01/04/22   ? Authorization Type Medicare/Cigna (needs 10th visit PN)   ? Progress Note Due on Visit 10   ? PT Start Time 1326   ? PT Stop Time 1410   ? PT Time Calculation (min) 44 min   ? Equipment Utilized During Treatment Other (comment)   Genu recurvatum knee orthosis on LLE and L Thuas[place] AFO  ? Activity Tolerance Patient tolerated treatment well   ? Behavior During Therapy [facility] for tasks assessed/performed   ? ?  ?  ? ?  ? ? ? ? ? ? ?Past Medical History:  ?Diagnosis Date  ? Breast cancer ([place]) 2014  ? Right  ? CKD (chronic kidney disease), stage III ([place])   ? COVID-19 virus infection 12/2020  ? Hypertension   ? Multiple sclerosis ([place])   ? Seizures ([place])   ? ?Past Surgical History:  ?Procedure Laterality Date  ? BREAST SURGERY    ? MASTECTOMY Right 2014  ? ?Patient Active Problem List  ? Diagnosis Date Noted  ? CKD (chronic kidney disease), stage III ([place])   ? COVID-19 virus infection 12/2020  ? History of right breast cancer 03/13/2020  ? Status post right mastectomy 03/13/2020  ? Abnormal gait 09/15/2018  ? Claustrophobia 09/15/2018  ? Hypertension 09/15/2018  ? Localization-related epilepsy ([place]) 09/15/2018  ? Multiple sclerosis ([place]) 09/15/2018  ? Paresthesia 09/15/2018  ? Pain in lower limb 12/31/2015  ? Seizure ([place]) 12/31/2015  ? Neuropathic pain 06/02/2015  ? No advance directives 11/02/2012  ? Refusal of blood transfusion for reasons of conscience 11/02/2012  ? Malignant neoplasm of upper-outer quadrant of right breast in female, estrogen receptor positive ([place]) 11/01/2012  ? ? ?REFERRING DIAG: G35 (ICD-10-CM) - Multiple sclerosis ([place])  ? ?THERAPY DIAG:  ?No  diagnosis found. ? ?PERTINENT HISTORY: relapsing remitting MS and breast CA and seizures. Pt has MS with LLE wekaness greater than other limbs, with coexisting debility- uses cane to walk has had multiple near falls ? ?PRECAUTIONS: Fall  ? ?SUBJECTIVE: Pt ambulated into clinic w/old L genu recurvatum brace on and rollator. Noted pt much more stable w/old recurvatum brace.  ? ?PAIN:  ?Are you having pain? Yes: NPRS scale: 5/10 ?Pain location: BLEs ?Pain description: nerve pain ?Aggravating factors: none ?Relieving factors: meds  ?OBJECTIVE:  ?  ?TODAY'S TREATMENT:  ?Gait Training  ?Gait pattern: step through pattern, decreased step length- Left, decreased hip/knee flexion- Left, decreased ankle dorsiflexion- Left, circumduction- Left, Left foot flat, genu recurvatum- Left, trunk rotated posterior- Left, abducted- Left, and poor foot clearance- Left ?Distance walked: >300' indoors ?Assistive device utilized: Environmental consultant - 4 wheeled and pt's old L genu recurvatum brace and L Thuas[place] AFO  ?Level of assistance: SBA ?Comments: Continue to add L Thuasne AFO w/genu recurvatum brace with significant reduction in ER of LLE and increased step clearance.  Ambulated x 230' with min cues for upright posture and slightly longer R step length.  Pt continues to be happy with this combo and discussed that I am hoping that [facility] from [place] can come up with a brace that controls both ankle and knee in one brace vs two.  PT assessed gait speed today with knee cage and AFO and it has improved from 0.92 ft/sec to 1.45 ft/sec today.   ? ?Therex:  ?Performed standing hip abd at // bars with BUE support x 10 reps on each side.  Emphasis on improving stance on LLE when moving RLE.  Standing hip extension, same emphasis x 10 reps.  Performed side stepping x 4 laps down // bars with cues for upright posture, and improving L knee flexion when LLE moving and then improved weight shift to the L when in stance on LLE.  Reviewed sit<>stand as on HEP.   Note more difficulty today but could be because she had AFO donned and doesn't at home.  Cues to keep feet side by side as she tends to slide RLE back, using LLE less.  Then reviewed marching at counter top as she had not been doing this one.  Performed facing counter top but difficult to get full range due to cabinets, so had her turn with RUE support on counter x 10 reps with cues to her and daughter on either providing assist (HHA) on pts LUE vs getting a chair and having her use back of chair for support.  ? ?PATIENT EDUCATION: ?Education details: [facility] appointment on 5/16  ?Person educated: Patient  ?Education method: Explanation  ?Education comprehension: verbalized understanding ?  ?  ?HOME EXERCISE PROGRAM: ?Access Code: [email or web address] ?URL: [email or web address] ?Date: 12/15/2021 ?Prepared by: Cameron Sprang ? ?Exercises ?- Sit to Stand Without Arm Support  - 1 x daily - 7 x weekly - 1 sets - 10 reps ?- Supine Bridge  - 1 x daily - 7 x weekly - 1 sets - 10 reps ?- Standing March with Counter Support  - 1 x daily - 7 x weekly - 1 sets - 10 reps ?- Standing Hip Abduction with Counter Support  - 1 x daily - 7 x weekly - 1 sets - 10 reps ?- Standing Hip Extension with Counter Support  - 1 x daily - 7 x weekly - 1 sets - 10 reps ?- Side Stepping with Counter Support  - 1 x daily - 7 x weekly - 1 sets - 10 reps ?Performed all bolded exercises.  ?  ?GOALS: ?Goals reviewed with patient? Yes ?  ?SHORT TERM GOALS: Target date: 12/03/2021 ?  ?Pt will be IND with HEP in order to indicate improved functional mobility and dec fall risk. ?Baseline: pt is performing HEP routinely ?Goal status: Goal met ?  ?2.  Pt will improve gait speed to >/=1.8 ft/sec w/ LRAD (walker vs cane) in order to indicate dec fall risk. ?  ?Baseline: 0.92 ft/sec with quad tip cane and min/guard ; 0.82 ft/s with SBA and quad tip st cane. 11/24/21 ?Goal status: progressing ?  ?3.  Pt will perform BERG and improve balance score 4 points from  baseline in order to indicate dec fall risk.  ?  ?Baseline: 32/56 on 11/10/21; 41/56 11/25/19 ?Goal status: MET ?  ?4.  Pt will ambulate 150' with RW vs rollator at mod I level in order to indicate more independent household ambulation.  ?  ?Baseline:  ?Goal status: Did not do 150' in session, but is S with RW in clinic with knee brace  ?  ?5.  Will assess varying AFOs/KAFOs (AFO w/ knee cage) in order to determine which would be most appropriate to dec fall risk.  ?Baseline:  ?Goal status: progressing, but feel that she will likely need custom AFO ?  ?  6.  Pt will improve TUG to </=30 secs w/ LRAD at S level in order to indicate dec fall risk  ?Baseline: 35.78 secs with quad tip cane; 28.82s w/rollator and genu recurvatum brace on LLE.  ?Goal status: MET ?  ?LONG TERM GOALS: Target date: 12/31/2021 ?  ?Pt will be IND with final HEP in order to indicate improved functional mobility and dec fall risk. ?Baseline:  ?Goal status: INITIAL ?  ?2.  Pt will improve gait speed to >/=2.6 ft/sec w/ LRAD in order to indicate dec fall risk. ?  ?Baseline: 0.92 ft/sec with quad tip cane and min/guard; 0.31ft/s with SBA and quad tip st. Cane 11/24/21 ?Goal status: progressing continue ?  ?3.  Pt will negotiate up/down 4 steps with B rails, up/down ramp and curb and ambulate x 500' outdoors over unlevel paved surfaces at S level with LRAD in order to indicate improved community mobility.  ?  ?Baseline:  ?Goal status: INITIAL ?  ?4.  Pt will improve BERG balance score 8 points from baseline in order to indicate dec fall risk.  ?  ?Baseline: 32/56 on 11/10/21; 41/56 11/25/19 ?Goal status: MET ?5.  Pt will improve TUG to </=20 secs w/ LRAD (and brace as indicated) at S level in order to indicate dec fall risk  ?  ?Baseline:  ?Goal status: INITIAL ?  ?Progress Note ?Reporting Period 11/05/21 to 12/15/21 ? ?See note below for Objective Data and Assessment of Progress/Goals.  ? ? ?  ?  ?ASSESSMENT: ?  ?CLINICAL IMPRESSION: ?Continue to work on gait  with rollator, knee cage (her old brace) and AFO.  She continues to demo marked improvement in quality of gait, including an improvement in gait speed from 0.92 ft/sec to 1.45 ft/sec today!  Also added to HE

## 2021-12-17 ENCOUNTER — Ambulatory Visit: Payer: Medicare Other | Admitting: Physical Therapy

## 2021-12-17 ENCOUNTER — Telehealth: Payer: Self-pay | Admitting: Physical Therapy

## 2021-12-17 DIAGNOSIS — M6281 Muscle weakness (generalized): Secondary | ICD-10-CM

## 2021-12-17 DIAGNOSIS — R2689 Other abnormalities of gait and mobility: Secondary | ICD-10-CM

## 2021-12-17 DIAGNOSIS — R2681 Unsteadiness on feet: Secondary | ICD-10-CM

## 2021-12-17 DIAGNOSIS — R293 Abnormal posture: Secondary | ICD-10-CM | POA: Diagnosis not present

## 2021-12-17 NOTE — Telephone Encounter (Signed)
Dr. Dagoberto Ligas,  ? ?I am currently seeing Shamone Bey for PT and she has reported significant increases in global pain over the past few weeks (maintained at 9-10/10). She attempted to contact your office a few weeks prior to discuss medication options but has not heard back.  ? ?Could you have your office contact her to discuss medication options? Nothing we have tried in PT has helped and it has begun to interfere with her therapy.  ? ?Thank you,  ?Mickie Bail Ivannia Willhelm, PT, DPT  ?

## 2021-12-17 NOTE — Therapy (Signed)
?OUTPATIENT PHYSICAL THERAPY TREATMENT NOTE ? ? ?Patient Name: Selena Bender ?MRN: [id number] ?DOB:May 23, 1951, 71 y.o., female ?Today's Date: 12/17/2021 ? ?PCP: Glenis Smoker, MD ?REFERRING PROVIDER: Courtney Heys, MD  ? ? PT End of Session - 12/17/21 1410   ? ? Visit Number 11   ? Number of Visits 17   ? Date for PT Re-Evaluation 01/04/22   ? Authorization Type Medicare/[employer] (needs 10th visit PN)   ? Progress Note Due on Visit 10   ? PT Start Time 1410   Pt arrived late  ? PT Stop Time 4098   ? PT Time Calculation (min) 33 min   ? Equipment Utilized During Treatment Other (comment)   Genu recurvatum knee orthosis on LLE  ? Activity Tolerance Patient tolerated treatment well   ? Behavior During Therapy [facility] for tasks assessed/performed   ? ?  ?  ? ?  ? ? ? ? ? ? ?Past Medical History:  ?Diagnosis Date  ? Breast cancer ([place]) 2014  ? Right  ? CKD (chronic kidney disease), stage III ([place])   ? COVID-19 virus infection 12/2020  ? Hypertension   ? Multiple sclerosis ([place])   ? Seizures ([place])   ? ?Past Surgical History:  ?Procedure Laterality Date  ? BREAST SURGERY    ? MASTECTOMY Right 2014  ? ?Patient Active Problem List  ? Diagnosis Date Noted  ? CKD (chronic kidney disease), stage III ([place])   ? COVID-19 virus infection 12/2020  ? History of right breast cancer 03/13/2020  ? Status post right mastectomy 03/13/2020  ? Abnormal gait 09/15/2018  ? Claustrophobia 09/15/2018  ? Hypertension 09/15/2018  ? Localization-related epilepsy ([place]) 09/15/2018  ? Multiple sclerosis ([place]) 09/15/2018  ? Paresthesia 09/15/2018  ? Pain in lower limb 12/31/2015  ? Seizure ([place]) 12/31/2015  ? Neuropathic pain 06/02/2015  ? No advance directives 11/02/2012  ? Refusal of blood transfusion for reasons of conscience 11/02/2012  ? Malignant neoplasm of upper-outer quadrant of right breast in female, estrogen receptor positive ([place]) 11/01/2012  ? ? ?REFERRING DIAG: G35 (ICD-10-CM) - Multiple sclerosis ([place])  ? ?THERAPY DIAG:   ?Unsteadiness on feet ? ?Muscle weakness (generalized) ? ?Other abnormalities of gait and mobility ? ?PERTINENT HISTORY: relapsing remitting MS and breast CA and seizures. Pt has MS with LLE wekaness greater than other limbs, with coexisting debility- uses cane to walk has had multiple near falls ? ?PRECAUTIONS: Fall  ? ?SUBJECTIVE: Pt ambulated into clinic without genu recurvatum braced donned, reported high levels of pain today. Pt reports she has not been feeling well recently and has not been performing HEP as much.  ? ?PAIN:  ?Are you having pain? Yes: NPRS scale: 9/10 ?Pain location: Entire body ?Pain description: nerve pain ?Aggravating factors: none ?Relieving factors: meds  ?OBJECTIVE:  ?  ?TODAY'S TREATMENT:  ?Therex:  ?SciFit level 2 for 8 minutes using BUE/BLEs for dynamic cardiovascular conditioning, UE/LE coordination and pain modulation. Noted neutral hip position throughout. Pt rated pain as 9/10 at beginning of exercise and rated it 7/10 at the end of 8 minutes.  ? ?Gait pattern: step through pattern, decreased stride length, decreased hip/knee flexion- Left, decreased ankle dorsiflexion- Left, circumduction- Left, Left foot flat, genu recurvatum- Left, lateral hip instability, lateral lean- Right, abducted- Left, and poor foot clearance- Left ?Distance walked: various clinic distances ?Assistive device utilized: Environmental consultant - 4 wheeled and old genu recurvatum brace on L knee ?Level of assistance: SBA ?Comments: Pt demonstrated improved difficulty w/transfers today 2/2 pain ? ?  At counter for improved hip abd/ER strength, LE coordination and endurance: ?-Lateral monster walks w/yellow theraband tied around distal quads, down counter and back x3 w/min guard and BUE support on counter. Min cues to maintain abduction of BLEs throughout.  ?-Fwd/retro monster walks w/yellow theraband, down and back x4 w/min guard and RUE support on counter.  ? ? ?PATIENT EDUCATION: ?Education details: Note sent to Dr. Dagoberto Ligas  regarding pain, walking throughout day for pain modulation and avoiding long periods of sitting ?Person educated: Patient  ?Education method: Explanation  ?Education comprehension: verbalized understanding ?  ?  ?HOME EXERCISE PROGRAM: ?Access Code: [email or web address] ?URL: [email or web address] ?Date: 12/15/2021 ?Prepared by: Cameron Sprang ? ?Exercises ?- Sit to Stand Without Arm Support  - 1 x daily - 7 x weekly - 1 sets - 10 reps ?- Supine Bridge  - 1 x daily - 7 x weekly - 1 sets - 10 reps ?- Standing March with Counter Support  - 1 x daily - 7 x weekly - 1 sets - 10 reps ?- Standing Hip Abduction with Counter Support  - 1 x daily - 7 x weekly - 1 sets - 10 reps ?- Standing Hip Extension with Counter Support  - 1 x daily - 7 x weekly - 1 sets - 10 reps ?- Side Stepping with Counter Support  - 1 x daily - 7 x weekly - 1 sets - 10 reps ? ?  ?GOALS: ?Goals reviewed with patient? Yes ?  ?SHORT TERM GOALS: Target date: 12/03/2021 ?  ?Pt will be IND with HEP in order to indicate improved functional mobility and dec fall risk. ?Baseline: pt is performing HEP routinely ?Goal status: Goal met ?  ?2.  Pt will improve gait speed to >/=1.8 ft/sec w/ LRAD (walker vs cane) in order to indicate dec fall risk. ?  ?Baseline: 0.92 ft/sec with quad tip cane and min/guard ; 0.82 ft/s with SBA and quad tip st cane. 11/24/21 ?Goal status: progressing ?  ?3.  Pt will perform BERG and improve balance score 4 points from baseline in order to indicate dec fall risk.  ?  ?Baseline: 32/56 on 11/10/21; 41/56 11/25/19 ?Goal status: MET ?  ?4.  Pt will ambulate 150' with RW vs rollator at mod I level in order to indicate more independent household ambulation.  ?  ?Baseline:  ?Goal status: Did not do 150' in session, but is S with RW in clinic with knee brace  ?  ?5.  Will assess varying AFOs/KAFOs (AFO w/ knee cage) in order to determine which would be most appropriate to dec fall risk.  ?Baseline:  ?Goal status: progressing, but feel that she  will likely need custom AFO ?  ?6.  Pt will improve TUG to </=30 secs w/ LRAD at S level in order to indicate dec fall risk  ?Baseline: 35.78 secs with quad tip cane; 28.82s w/rollator and genu recurvatum brace on LLE.  ?Goal status: MET ?  ?LONG TERM GOALS: Target date: 12/31/2021 ?  ?Pt will be IND with final HEP in order to indicate improved functional mobility and dec fall risk. ?Baseline:  ?Goal status: INITIAL ?  ?2.  Pt will improve gait speed to >/=2.6 ft/sec w/ LRAD in order to indicate dec fall risk. ?  ?Baseline: 0.92 ft/sec with quad tip cane and min/guard; 0.41ft/s with SBA and quad tip st. Cane 11/24/21 ?Goal status: progressing continue ?  ?3.  Pt will negotiate up/down 4 steps with B rails, up/down ramp and curb and  ambulate x 500' outdoors over unlevel paved surfaces at S level with LRAD in order to indicate improved community mobility.  ?  ?Baseline:  ?Goal status: INITIAL ?  ?4.  Pt will improve BERG balance score 8 points from baseline in order to indicate dec fall risk.  ?  ?Baseline: 32/56 on 11/10/21; 41/56 11/25/19 ?Goal status: MET ?5.  Pt will improve TUG to </=20 secs w/ LRAD (and brace as indicated) at S level in order to indicate dec fall risk  ?  ?Baseline:  ?Goal status: INITIAL ? ? ?  ?ASSESSMENT: ?  ?CLINICAL IMPRESSION: ?Emphasis of skilled PT session on endurance, BLE coordination and bilat hip strength. Session limited due to pt arriving late. Pt continues to report high levels of "debilitating" pain and does not feel as though her medicine is helping. Sent message to pt's neurologist to inquire about medicine changes. Pt continues to demonstrate ER of LLE and R posterolateral lean w/gait even w/use of genu recurvatum brace. Noted increased difficulty w/sit <>stands today 2/2 pain. Continue POC.  ?  ?  ?OBJECTIVE IMPAIRMENTS Abnormal gait, decreased activity tolerance, decreased balance, decreased coordination, decreased endurance, decreased knowledge of use of DME, decreased mobility,  difficulty walking, decreased ROM, decreased strength, hypomobility, impaired perceived functional ability, impaired flexibility, impaired sensation, and postural dysfunction.  ?  ?ACTIVITY LIMITATIONS commu

## 2021-12-21 ENCOUNTER — Other Ambulatory Visit: Payer: Self-pay | Admitting: Family Medicine

## 2021-12-21 ENCOUNTER — Ambulatory Visit
Admission: RE | Admit: 2021-12-21 | Discharge: 2021-12-21 | Disposition: A | Discharge status: Home / Self Care | Payer: Medicare Other | Source: Ambulatory Visit | Attending: Family Medicine | Admitting: Family Medicine

## 2021-12-21 DIAGNOSIS — J22 Unspecified acute lower respiratory infection: Secondary | ICD-10-CM

## 2021-12-21 DIAGNOSIS — Z9011 Acquired absence of right breast and nipple: Secondary | ICD-10-CM | POA: Diagnosis not present

## 2021-12-21 DIAGNOSIS — R509 Fever, unspecified: Secondary | ICD-10-CM | POA: Diagnosis not present

## 2021-12-21 DIAGNOSIS — R0602 Shortness of breath: Secondary | ICD-10-CM | POA: Diagnosis not present

## 2021-12-21 DIAGNOSIS — R051 Acute cough: Secondary | ICD-10-CM | POA: Diagnosis not present

## 2021-12-22 ENCOUNTER — Ambulatory Visit: Payer: Medicare Other | Admitting: Rehabilitation

## 2021-12-22 ENCOUNTER — Encounter: Payer: Self-pay | Admitting: Rehabilitation

## 2021-12-22 DIAGNOSIS — M6281 Muscle weakness (generalized): Secondary | ICD-10-CM

## 2021-12-22 DIAGNOSIS — R2689 Other abnormalities of gait and mobility: Secondary | ICD-10-CM | POA: Diagnosis not present

## 2021-12-22 DIAGNOSIS — R293 Abnormal posture: Secondary | ICD-10-CM

## 2021-12-22 DIAGNOSIS — R2681 Unsteadiness on feet: Secondary | ICD-10-CM | POA: Diagnosis not present

## 2021-12-22 NOTE — Therapy (Signed)
?OUTPATIENT PHYSICAL THERAPY TREATMENT NOTE ? ? ?Patient Name: Selena Bender ?MRN: [id number] ?DOB:Aug 12, 1950, 71 y.o., female ?Today's Date: 12/22/2021 ? ?PCP: Glenis Smoker, MD ?REFERRING PROVIDER: Courtney Heys, MD  ? ? PT End of Session - 12/22/21 1245   ? ? Visit Number 12   ? Number of Visits 17   ? Date for PT Re-Evaluation 01/04/22   ? Authorization Type Medicare/[employer] (needs 10th visit PN)   ? Progress Note Due on Visit 10   ? PT Start Time 1240   pt late to session  ? PT Stop Time 1315   ? PT Time Calculation (min) 35 min   ? Equipment Utilized During Treatment Other (comment)   Genu recurvatum knee orthosis on LLE  ? Activity Tolerance Patient tolerated treatment well   ? Behavior During Therapy [facility] for tasks assessed/performed   ? ?  ?  ? ?  ? ? ? ? ? ? ?Past Medical History:  ?Diagnosis Date  ? Breast cancer ([place]) 2014  ? Right  ? CKD (chronic kidney disease), stage III ([place])   ? COVID-19 virus infection 12/2020  ? Hypertension   ? Multiple sclerosis ([place])   ? Seizures ([place])   ? ?Past Surgical History:  ?Procedure Laterality Date  ? BREAST SURGERY    ? MASTECTOMY Right 2014  ? ?Patient Active Problem List  ? Diagnosis Date Noted  ? CKD (chronic kidney disease), stage III ([place])   ? COVID-19 virus infection 12/2020  ? History of right breast cancer 03/13/2020  ? Status post right mastectomy 03/13/2020  ? Abnormal gait 09/15/2018  ? Claustrophobia 09/15/2018  ? Hypertension 09/15/2018  ? Localization-related epilepsy ([place]) 09/15/2018  ? Multiple sclerosis ([place]) 09/15/2018  ? Paresthesia 09/15/2018  ? Pain in lower limb 12/31/2015  ? Seizure ([place]) 12/31/2015  ? Neuropathic pain 06/02/2015  ? No advance directives 11/02/2012  ? Refusal of blood transfusion for reasons of conscience 11/02/2012  ? Malignant neoplasm of upper-outer quadrant of right breast in female, estrogen receptor positive ([place]) 11/01/2012  ? ? ?REFERRING DIAG: G35 (ICD-10-CM) - Multiple sclerosis ([place])  ? ?THERAPY DIAG:   ?Unsteadiness on feet ? ?Muscle weakness (generalized) ? ?Other abnormalities of gait and mobility ? ?Abnormal posture ? ?PERTINENT HISTORY: relapsing remitting MS and breast CA and seizures. Pt has MS with LLE wekaness greater than other limbs, with coexisting debility- uses cane to walk has had multiple near falls ? ?PRECAUTIONS: Fall  ? ?SUBJECTIVE: Pt still not feeling great, went to urgent care yesterday, tested negative for everything, no fever since Saturday.  Still having increased pain in whole body.  ?PAIN:  ?Are you having pain? Yes: NPRS scale: 8/10 ?Pain location: Entire body ?Pain description: nerve pain ?Aggravating factors: none ?Relieving factors: meds  ?OBJECTIVE:  ?  ?TODAY'S TREATMENT:  ?NMR:  ?Removed knee brace for all NMR today.  Performed sit<>stand x 10 reps without UE support (elevated mat slightly and had her reach forward for improved forward weight shift).  PT assisted with maintaining RLE in place as it tends to move posteriorly to assist more with transition.  Cues for upright posture at end of stand.  Emphasis on slow controlled sit throughout.  Progressed to standing squat with lateral weight shifts x 10 reps.  Cues to keep LE abducted slightly.  Assisted into tall kneeling from standing facing mat with [facility] bench placed in front of pt for safety.  While in tall kneel worked on mini squats for improved hip activation x  15 reps (reducing UE support during task from BUE>no UE).  R LE abd x 10 reps with BUE>single UE support and verbal cues for posture, tactile cues for weight shift at pelvis.  Tall kneel to R half kneel transitions x 5 reps, then remained in R half kneel with alt UE flexion x 5 reps.  Assist and cues to maintain L hip active throughout.   ? ?Gait pattern: step through pattern, decreased stride length, decreased hip/knee flexion- Left, decreased ankle dorsiflexion- Left, circumduction- Left, Left foot flat, genu recurvatum- Left, lateral hip instability, lateral lean-  Right, abducted- Left, and poor foot clearance- Left ?Distance walked: various clinic distances ?Assistive device utilized: Single point cane and old genu recurvatum brace on L knee .  She comes with quad tip cane today and brace donned.   ?Level of assistance: Min A due to L foot catching at times.  Cues for larger step and to attempt to reduce ER.  ?Comments:see above ? ? ? ? ?PATIENT EDUCATION: ?Education details: PT texting Chris from [place], trying to set up new appt.  ?Person educated: Patient  ?Education method: Explanation  ?Education comprehension: verbalized understanding ?  ?  ?HOME EXERCISE PROGRAM: ?Access Code: [email or web address] ?URL: [email or web address] ?Date: 12/15/2021 ?Prepared by: Cameron Sprang ? ?Exercises ?- Sit to Stand Without Arm Support  - 1 x daily - 7 x weekly - 1 sets - 10 reps ?- Supine Bridge  - 1 x daily - 7 x weekly - 1 sets - 10 reps ?- Standing March with Counter Support  - 1 x daily - 7 x weekly - 1 sets - 10 reps ?- Standing Hip Abduction with Counter Support  - 1 x daily - 7 x weekly - 1 sets - 10 reps ?- Standing Hip Extension with Counter Support  - 1 x daily - 7 x weekly - 1 sets - 10 reps ?- Side Stepping with Counter Support  - 1 x daily - 7 x weekly - 1 sets - 10 reps ? ?  ?GOALS: ?Goals reviewed with patient? Yes ?  ?SHORT TERM GOALS: Target date: 12/03/2021 ?  ?Pt will be IND with HEP in order to indicate improved functional mobility and dec fall risk. ?Baseline: pt is performing HEP routinely ?Goal status: Goal met ?  ?2.  Pt will improve gait speed to >/=1.8 ft/sec w/ LRAD (walker vs cane) in order to indicate dec fall risk. ?  ?Baseline: 0.92 ft/sec with quad tip cane and min/guard ; 0.82 ft/s with SBA and quad tip st cane. 11/24/21 ?Goal status: progressing ?  ?3.  Pt will perform BERG and improve balance score 4 points from baseline in order to indicate dec fall risk.  ?  ?Baseline: 32/56 on 11/10/21; 41/56 11/25/19 ?Goal status: MET ?  ?4.  Pt will ambulate 150'  with RW vs rollator at mod I level in order to indicate more independent household ambulation.  ?  ?Baseline:  ?Goal status: Did not do 150' in session, but is S with RW in clinic with knee brace  ?  ?5.  Will assess varying AFOs/KAFOs (AFO w/ knee cage) in order to determine which would be most appropriate to dec fall risk.  ?Baseline:  ?Goal status: progressing, but feel that she will likely need custom AFO ?  ?6.  Pt will improve TUG to </=30 secs w/ LRAD at S level in order to indicate dec fall risk  ?Baseline: 35.78 secs with quad tip cane; 28.82s w/rollator and genu recurvatum  brace on LLE.  ?Goal status: MET ?  ?LONG TERM GOALS: Target date: 12/31/2021 ?  ?Pt will be IND with final HEP in order to indicate improved functional mobility and dec fall risk. ?Baseline:  ?Goal status: INITIAL ?  ?2.  Pt will improve gait speed to >/=2.6 ft/sec w/ LRAD in order to indicate dec fall risk. ?  ?Baseline: 0.92 ft/sec with quad tip cane and min/guard; 0.44ft/s with SBA and quad tip st. Cane 11/24/21 ?Goal status: progressing continue ?  ?3.  Pt will negotiate up/down 4 steps with B rails, up/down ramp and curb and ambulate x 500' outdoors over unlevel paved surfaces at S level with LRAD in order to indicate improved community mobility.  ?  ?Baseline:  ?Goal status: INITIAL ?  ?4.  Pt will improve BERG balance score 8 points from baseline in order to indicate dec fall risk.  ?  ?Baseline: 32/56 on 11/10/21; 41/56 11/25/19 ?Goal status: MET ?5.  Pt will improve TUG to </=20 secs w/ LRAD (and brace as indicated) at S level in order to indicate dec fall risk  ?  ?Baseline:  ?Goal status: INITIAL ? ? ?  ?ASSESSMENT: ?  ?CLINICAL IMPRESSION: ?Session continues to be limited by pain, but more so that patient still not feeling well today.  Chris from [place] did not come due to schedule mix up, so PT will try and set this up again.  ?  ?  ?OBJECTIVE IMPAIRMENTS Abnormal gait, decreased activity tolerance, decreased balance, decreased  coordination, decreased endurance, decreased knowledge of use of DME, decreased mobility, difficulty walking, decreased ROM, decreased strength, hypomobility, impaired perceived functional ability, impaired

## 2021-12-24 ENCOUNTER — Ambulatory Visit: Payer: Medicare Other | Admitting: Physical Therapy

## 2021-12-24 DIAGNOSIS — M6281 Muscle weakness (generalized): Secondary | ICD-10-CM

## 2021-12-24 DIAGNOSIS — R2689 Other abnormalities of gait and mobility: Secondary | ICD-10-CM | POA: Diagnosis not present

## 2021-12-24 DIAGNOSIS — R2681 Unsteadiness on feet: Secondary | ICD-10-CM

## 2021-12-24 DIAGNOSIS — R293 Abnormal posture: Secondary | ICD-10-CM | POA: Diagnosis not present

## 2021-12-24 NOTE — Therapy (Signed)
OUTPATIENT PHYSICAL THERAPY TREATMENT NOTE   Patient Name: Selena Bender MRN: [id number] DOB:1951/06/20, 71 y.o., female Today's Date: 12/24/2021  PCP: Glenis Smoker, MD REFERRING PROVIDER: Courtney Heys, MD    PT End of Session - 12/24/21 1243     Visit Number 13    Number of Visits 17    Date for PT Re-Evaluation 01/04/22    Authorization Type Medicare/[employer] (needs 10th visit PN)    Progress Note Due on Visit 10    PT Start Time 18   Pt arrived late   PT Stop Time 42    PT Time Calculation (min) 38 min    Equipment Utilized During Treatment Other (comment)   Genu recurvatum knee orthosis on LLE   Activity Tolerance Patient tolerated treatment well    Behavior During Therapy [facility] for tasks assessed/performed                 Past Medical History:  Diagnosis Date   Breast cancer ([place]) 2014   Right   CKD (chronic kidney disease), stage III ([place])    COVID-19 virus infection 12/2020   Hypertension    Multiple sclerosis ([place])    Seizures ([place])    Past Surgical History:  Procedure Laterality Date   BREAST SURGERY     MASTECTOMY Right 2014   Patient Active Problem List   Diagnosis Date Noted   CKD (chronic kidney disease), stage III ([place])    COVID-19 virus infection 12/2020   History of right breast cancer 03/13/2020   Status post right mastectomy 03/13/2020   Abnormal gait 09/15/2018   Claustrophobia 09/15/2018   Hypertension 09/15/2018   Localization-related epilepsy ([place]) 09/15/2018   Multiple sclerosis ([place]) 09/15/2018   Paresthesia 09/15/2018   Pain in lower limb 12/31/2015   Seizure ([place]) 12/31/2015   Neuropathic pain 06/02/2015   No advance directives 11/02/2012   Refusal of blood transfusion for reasons of conscience 11/02/2012   Malignant neoplasm of upper-outer quadrant of right breast in female, estrogen receptor positive ([place]) 11/01/2012    REFERRING DIAG: G35 (ICD-10-CM) - Multiple sclerosis ([place])   THERAPY DIAG:   Unsteadiness on feet  Other abnormalities of gait and mobility  Muscle weakness (generalized)  PERTINENT HISTORY: relapsing remitting MS and breast CA and seizures. Pt has MS with LLE wekaness greater than other limbs, with coexisting debility- uses cane to walk has had multiple near falls  PRECAUTIONS: Fall   SUBJECTIVE: Pt still not feeling great. Pain is still high. On antibiotics until next week. Has been working on sit <>stands  PAIN:  Are you having pain? Yes: NPRS scale: 8/10 Pain location: Entire body Pain description: nerve pain Aggravating factors: none Relieving factors: meds  OBJECTIVE:    TODAY'S TREATMENT:  NMR:  Session completed without genu recurvatum brace donned   Sit <>stands w/half moon foam under heels from elevated mat table without UE support for improved eccentric quad strength, transfers and anterior weight shifting. Began w/8 reps from elevated mat and progressed to lower mat height, x5 reps each height until reaching 16" height. Pt required multiple anterior weight shift attempts on lower mat height and min A while standing to prevent anterior LOB but was able to come to stand without assistance.   In // bars, eccentric lateral heel taps from 8" step w/BUE support for eccentric quad control and bias of L knee flexion, x15 reps on RLE and x8 on LLE.  Pt required max verbal and visual cues for proper form initially  but able to perform w/CGA. Noted maintained hyperextension of LLE when using RLE as stance leg, but pt able to maintain slight knee flexion when LLE was stance leg.   Gait pattern: step through pattern, decreased stride length, decreased hip/knee flexion- Left, decreased ankle dorsiflexion- Left, circumduction- Left, Left foot flat, genu recurvatum- Left, lateral hip instability, lateral lean- Right, abducted- Left, and poor foot clearance- Left Distance walked: various clinic distances Assistive device utilized: Walker - 4 wheeled and old genu  recurvatum brace on L knee .   Level of assistance: CGA     PATIENT EDUCATION: Education details: No updates from [employer], continue HEP  Person educated: Patient and Daughter  Education method: Explanation  Education comprehension: verbalized understanding     HOME EXERCISE PROGRAM: Access Code: [email or web address] URL: [email or web address] Date: 12/15/2021 Prepared by: Cameron Sprang  Exercises - Sit to Stand Without Arm Support  - 1 x daily - 7 x weekly - 1 sets - 10 reps - Supine Bridge  - 1 x daily - 7 x weekly - 1 sets - 10 reps - Standing March with Counter Support  - 1 x daily - 7 x weekly - 1 sets - 10 reps - Standing Hip Abduction with Counter Support  - 1 x daily - 7 x weekly - 1 sets - 10 reps - Standing Hip Extension with Counter Support  - 1 x daily - 7 x weekly - 1 sets - 10 reps - Side Stepping with Counter Support  - 1 x daily - 7 x weekly - 1 sets - 10 reps    GOALS: Goals reviewed with patient? Yes   SHORT TERM GOALS: Target date: 12/03/2021   Pt will be IND with HEP in order to indicate improved functional mobility and dec fall risk. Baseline: pt is performing HEP routinely Goal status: Goal met   2.  Pt will improve gait speed to >/=1.8 ft/sec w/ LRAD (walker vs cane) in order to indicate dec fall risk.   Baseline: 0.92 ft/sec with quad tip cane and min/guard ; 0.82 ft/s with SBA and quad tip st cane. 11/24/21 Goal status: progressing   3.  Pt will perform BERG and improve balance score 4 points from baseline in order to indicate dec fall risk.    Baseline: 32/56 on 11/10/21; 41/56 11/25/19 Goal status: MET   4.  Pt will ambulate 150' with RW vs rollator at mod I level in order to indicate more independent household ambulation.    Baseline:  Goal status: Did not do 150' in session, but is S with RW in clinic with knee brace    5.  Will assess varying AFOs/KAFOs (AFO w/ knee cage) in order to determine which would be most appropriate to dec fall  risk.  Baseline:  Goal status: progressing, but feel that she will likely need custom AFO   6.  Pt will improve TUG to </=30 secs w/ LRAD at S level in order to indicate dec fall risk  Baseline: 35.78 secs with quad tip cane; 28.82s w/rollator and genu recurvatum brace on LLE.  Goal status: MET   LONG TERM GOALS: Target date: 12/31/2021   Pt will be IND with final HEP in order to indicate improved functional mobility and dec fall risk. Baseline:  Goal status: INITIAL   2.  Pt will improve gait speed to >/=2.6 ft/sec w/ LRAD in order to indicate dec fall risk.   Baseline: 0.92 ft/sec with quad tip cane and min/guard; 0.42f/s with  SBA and quad tip st. Cane 11/24/21 Goal status: progressing continue   3.  Pt will negotiate up/down 4 steps with B rails, up/down ramp and curb and ambulate x 500' outdoors over unlevel paved surfaces at S level with LRAD in order to indicate improved community mobility.    Baseline:  Goal status: INITIAL   4.  Pt will improve BERG balance score 8 points from baseline in order to indicate dec fall risk.    Baseline: 32/56 on 11/10/21; 41/56 11/25/19 Goal status: MET 5.  Pt will improve TUG to </=20 secs w/ LRAD (and brace as indicated) at S level in order to indicate dec fall risk    Baseline:  Goal status: INITIAL     ASSESSMENT:   CLINICAL IMPRESSION: Emphasis of skilled PT session on eccentric quad control and biasing knee flexion on LLE. Session limited due to pt arriving late. Pt still not feeling well but tolerated session well. Pt able to perform sit <>stands w/quad bias well w/assistance for immediate standing balance but no increase in pain. Pt left brace w/therapist in case [facility] shows up for session. Continue POC.      OBJECTIVE IMPAIRMENTS Abnormal gait, decreased activity tolerance, decreased balance, decreased coordination, decreased endurance, decreased knowledge of use of DME, decreased mobility, difficulty walking, decreased ROM,  decreased strength, hypomobility, impaired perceived functional ability, impaired flexibility, impaired sensation, and postural dysfunction.    ACTIVITY LIMITATIONS community activity, meal prep, laundry, and shopping.    PERSONAL FACTORS Past/current experiences, Time since onset of injury/illness/exacerbation, and 1-2 comorbidities: see above  are also affecting patient's functional outcome.      REHAB POTENTIAL: Good   CLINICAL DECISION MAKING: Evolving/moderate complexity   EVALUATION COMPLEXITY: Moderate   PLAN: PT FREQUENCY: 2x/week   PT DURATION: 8 weeks   PLANNED INTERVENTIONS: Therapeutic exercises, Therapeutic activity, Neuromuscular re-education, Balance training, Gait training, Patient/Family education, Vestibular training, DME instructions, Aquatic Therapy, Electrical stimulation, and Manual therapy   PLAN FOR NEXT SESSION:  Mickie Bail has new genu recurvatum brace above her desk. How is HEP? sit<>stand practice, heel cord stretch. LLE strengthening.  Gait training w/rollator, AFO and OLD recurvatum brace.  Practice w/turns and navigating obstacles. Eccentric quad strengthening    Cruzita Lederer Gayl Ivanoff, PT, DPT 12/24/21, 2:43 PM

## 2021-12-28 ENCOUNTER — Encounter: Payer: Self-pay | Admitting: Rehabilitation

## 2021-12-28 ENCOUNTER — Ambulatory Visit: Payer: Medicare Other | Admitting: Rehabilitation

## 2021-12-28 DIAGNOSIS — R293 Abnormal posture: Secondary | ICD-10-CM | POA: Diagnosis not present

## 2021-12-28 DIAGNOSIS — R2689 Other abnormalities of gait and mobility: Secondary | ICD-10-CM | POA: Diagnosis not present

## 2021-12-28 DIAGNOSIS — M6281 Muscle weakness (generalized): Secondary | ICD-10-CM

## 2021-12-28 DIAGNOSIS — R2681 Unsteadiness on feet: Secondary | ICD-10-CM

## 2021-12-28 NOTE — Therapy (Signed)
OUTPATIENT PHYSICAL THERAPY TREATMENT NOTE   Patient Name: Selena Bender MRN: [id number] DOB:1951-06-05, 71 y.o., female Today's Date: 12/28/2021  PCP: Glenis Smoker, MD REFERRING PROVIDER: Courtney Heys, MD    PT End of Session - 12/28/21 1106     Visit Number 14    Number of Visits 17    Date for PT Re-Evaluation 01/04/22    Authorization Type Medicare/Cign[employer] (needs 10th visit PN)    Progress Note Due on Visit 10    PT Start Time 1103    PT Stop Time 1145    PT Time Calculation (min) 42 min    Equipment Utilized During Treatment Other (comment)   Genu recurvatum knee orthosis on LLE   Activity Tolerance Patient tolerated treatment well    Behavior During Therapy [facility] for tasks assessed/performed                 Past Medical History:  Diagnosis Date   Breast cancer ([place]) 2014   Right   CKD (chronic kidney disease), stage III ([place])    COVID-19 virus infection 12/2020   Hypertension    Multiple sclerosis ([place])    Seizures ([place])    Past Surgical History:  Procedure Laterality Date   BREAST SURGERY     MASTECTOMY Right 2014   Patient Active Problem List   Diagnosis Date Noted   CKD (chronic kidney disease), stage III ([place])    COVID-19 virus infection 12/2020   History of right breast cancer 03/13/2020   Status post right mastectomy 03/13/2020   Abnormal gait 09/15/2018   Claustrophobia 09/15/2018   Hypertension 09/15/2018   Localization-related epilepsy ([place]) 09/15/2018   Multiple sclerosis ([place]) 09/15/2018   Paresthesia 09/15/2018   Pain in lower limb 12/31/2015   Seizure ([place]) 12/31/2015   Neuropathic pain 06/02/2015   No advance directives 11/02/2012   Refusal of blood transfusion for reasons of conscience 11/02/2012   Malignant neoplasm of upper-outer quadrant of right breast in female, estrogen receptor positive ([place]) 11/01/2012    REFERRING DIAG: G35 (ICD-10-CM) - Multiple sclerosis ([place])   THERAPY DIAG:  Unsteadiness on  feet  Other abnormalities of gait and mobility  Muscle weakness (generalized)  Abnormal posture  PERTINENT HISTORY: relapsing remitting MS and breast CA and seizures. Pt has MS with LLE wekaness greater than other limbs, with coexisting debility- uses cane to walk has had multiple near falls  PRECAUTIONS: Fall   SUBJECTIVE: Pt still having pain, but feeling better from having cold last week.   PAIN:  Are you having pain? Yes: NPRS scale: 8/10 Pain location: Entire body Pain description: nerve pain Aggravating factors: none Relieving factors: meds  OBJECTIVE:    TODAY'S TREATMENT:  NMR:   [facility] PT Assessment - 12/28/21 1110       Standardized Balance Assessment   Standardized Balance Assessment Berg Balance Test      Berg Balance Test   Sit to Stand Able to stand  independently using hands    Standing Unsupported Able to stand safely 2 minutes    Sitting with Back Unsupported but Feet Supported on Floor or Stool Able to sit safely and securely 2 minutes    Stand to Sit Sits safely with minimal use of hands    Transfers Able to transfer safely, minor use of hands    Standing Unsupported with Eyes Closed Able to stand 10 seconds safely    Standing Unsupported with Feet Together Able to place feet together independently and stand 1 minute  safely    From Standing, Reach Forward with Outstretched Arm Can reach confidently >25 cm (10")    From Standing Position, Pick up Object from [place] to pick up shoe safely and easily    From Standing Position, Turn to Look Behind Over each Shoulder Looks behind from both sides and weight shifts well    Turn 360 Degrees Able to turn 360 degrees safely but slowly    Standing Unsupported, Alternately Place Feet on Step/Stool Able to complete >2 steps/needs minimal assist    Standing Unsupported, One Foot in Front Able to plae foot ahead of the other independently and hold 30 seconds   LLE behind RLE   Standing on One Leg Tries to lift  leg/unable to hold 3 seconds but remains standing independently    Total Score 46            BERG done with knee cage and PLS AFO donned.   TUG: 22.25 secs with knee cage and PLS AFO and rollator.  Improved time by 6 secs.   Self care:  PT went ahead and scheduled visit at [facility] for [facility] Friday 6/2 with Gerald Stabs as this PT has spoken with Gerald Stabs.  Explained that we may have to do 2 braces, but he will see what he can do when she is in clinic.  Daughter present and ok with this.  Also had pt schedule 2x/wk for 8 more weeks for PT.  Discussed that we may not need all of this time, but that I do want to see her in new braces.     Gait pattern: step through pattern, decreased stride length, decreased hip/knee flexion- Left, decreased ankle dorsiflexion- Left, circumduction- Left, Left foot flat, genu recurvatum- Left, lateral hip instability, lateral lean- Right, abducted- Left, and poor foot clearance- Left Distance walked: various clinic distances Assistive device utilized: Walker - 4 wheeled and old genu recurvatum brace on L knee .   Level of assistance: CGA     [facility] PT Assessment - 12/28/21 1110       Standardized Balance Assessment   Standardized Balance Assessment Berg Balance Test      Berg Balance Test   Sit to Stand Able to stand  independently using hands    Standing Unsupported Able to stand safely 2 minutes    Sitting with Back Unsupported but Feet Supported on Floor or Stool Able to sit safely and securely 2 minutes    Stand to Sit Sits safely with minimal use of hands    Transfers Able to transfer safely, minor use of hands    Standing Unsupported with Eyes Closed Able to stand 10 seconds safely    Standing Unsupported with Feet Together Able to place feet together independently and stand 1 minute safely    From Standing, Reach Forward with Outstretched Arm Can reach confidently >25 cm (10")    From Standing Position, Pick up Object from Floor Able to pick up shoe safely  and easily    From Standing Position, Turn to Look Behind Over each Shoulder Looks behind from both sides and weight shifts well    Turn 360 Degrees Able to turn 360 degrees safely but slowly    Standing Unsupported, Alternately Place Feet on Step/Stool Able to complete >2 steps/needs minimal assist    Standing Unsupported, One Foot in Front Able to plae foot ahead of the other independently and hold 30 seconds   LLE behind RLE   Standing on One Leg Tries to  lift leg/unable to hold 3 seconds but remains standing independently    Total Score 46            PATIENT EDUCATION: Education details: No updates from [employer], continue HEP  Person educated: Patient and Daughter  Education method: Explanation  Education comprehension: verbalized understanding     HOME EXERCISE PROGRAM: Access Code: [email or web address] URL: [email or web address] Date: 12/15/2021 Prepared by: Cameron Sprang  Exercises - Sit to Stand Without Arm Support  - 1 x daily - 7 x weekly - 1 sets - 10 reps - Supine Bridge  - 1 x daily - 7 x weekly - 1 sets - 10 reps - Standing March with Counter Support  - 1 x daily - 7 x weekly - 1 sets - 10 reps - Standing Hip Abduction with Counter Support  - 1 x daily - 7 x weekly - 1 sets - 10 reps - Standing Hip Extension with Counter Support  - 1 x daily - 7 x weekly - 1 sets - 10 reps - Side Stepping with Counter Support  - 1 x daily - 7 x weekly - 1 sets - 10 reps    GOALS: Goals reviewed with patient? Yes   SHORT TERM GOALS: Target date: 12/03/2021   Pt will be IND with HEP in order to indicate improved functional mobility and dec fall risk. Baseline: pt is performing HEP routinely Goal status: Goal met   2.  Pt will improve gait speed to >/=1.8 ft/sec w/ LRAD (walker vs cane) in order to indicate dec fall risk.   Baseline: 0.92 ft/sec with quad tip cane and min/guard ; 0.82 ft/s with SBA and quad tip st cane. 11/24/21 Goal status: progressing   3.  Pt will perform  BERG and improve balance score 4 points from baseline in order to indicate dec fall risk.    Baseline: 32/56 on 11/10/21; 41/56 11/25/19 Goal status: MET   4.  Pt will ambulate 150' with RW vs rollator at mod I level in order to indicate more independent household ambulation.    Baseline:  Goal status: Did not do 150' in session, but is S with RW in clinic with knee brace    5.  Will assess varying AFOs/KAFOs (AFO w/ knee cage) in order to determine which would be most appropriate to dec fall risk.  Baseline:  Goal status: progressing, but feel that she will likely need custom AFO   6.  Pt will improve TUG to </=30 secs w/ LRAD at S level in order to indicate dec fall risk  Baseline: 35.78 secs with quad tip cane; 28.82s w/rollator and genu recurvatum brace on LLE.  Goal status: MET   LONG TERM GOALS: Target date: 12/31/2021   Pt will be IND with final HEP in order to indicate improved functional mobility and dec fall risk. Baseline:  Goal status: INITIAL   2.  Pt will improve gait speed to >/=2.6 ft/sec w/ LRAD in order to indicate dec fall risk.   Baseline: 0.92 ft/sec with quad tip cane and min/guard; 0.81ft/s with SBA and quad tip st. Cane 11/24/21 Goal status: progressing continue   3.  Pt will negotiate up/down 4 steps with B rails, up/down ramp and curb and ambulate x 500' outdoors over unlevel paved surfaces at S level with LRAD in order to indicate improved community mobility.    Baseline:  Goal status: INITIAL   4.  Pt will improve BERG balance score 8 points from baseline in order  to indicate dec fall risk.    Baseline: 32/56 on 11/10/21; 41/56 11/25/19, 46/56 on 12/28/21 Goal status: MET  5.  Pt will improve TUG to </=20 secs w/ LRAD (and brace as indicated) at S level in order to indicate dec fall risk    Baseline: 28.89 secs was last assessment (with rollator and knee/AFO brace), 22.25 on 12/28/21 with her old knee cage and AFO) Goal status: MET      ASSESSMENT:    CLINICAL IMPRESSION: Began checking LTGs in session with both knee cage and PLS AFO donned today.  She has improved both BERG and TUG, meeting goals and improving safety.  PT set up appt with [facility] at their clinic since PT has spoken with Brooke Pace already.  Pt and daughter in agreement.      OBJECTIVE IMPAIRMENTS Abnormal gait, decreased activity tolerance, decreased balance, decreased coordination, decreased endurance, decreased knowledge of use of DME, decreased mobility, difficulty walking, decreased ROM, decreased strength, hypomobility, impaired perceived functional ability, impaired flexibility, impaired sensation, and postural dysfunction.    ACTIVITY LIMITATIONS community activity, meal prep, laundry, and shopping.    PERSONAL FACTORS Past/current experiences, Time since onset of injury/illness/exacerbation, and 1-2 comorbidities: see above  are also affecting patient's functional outcome.      REHAB POTENTIAL: Good   CLINICAL DECISION MAKING: Evolving/moderate complexity   EVALUATION COMPLEXITY: Moderate   PLAN: PT FREQUENCY: 2x/week   PT DURATION: 8 weeks   PLANNED INTERVENTIONS: Therapeutic exercises, Therapeutic activity, Neuromuscular re-education, Balance training, Gait training, Patient/Family education, Vestibular training, DME instructions, Aquatic Therapy, Electrical stimulation, and Manual therapy   PLAN FOR NEXT SESSION:  check remaining LTGs and recert, Mickie Bail has new genu recurvatum brace above her desk. How is HEP? sit<>stand practice, heel cord stretch. LLE strengthening.  Gait training w/rollator, AFO and OLD recurvatum brace.  Practice w/turns and navigating obstacles. Eccentric quad strengthening    Cameron Sprang, PT, MPT [facility] [address] [place], [place], [address] Phone: [phone]   Fax:  [phone] 12/28/21, 12:23 PM

## 2021-12-29 ENCOUNTER — Ambulatory Visit: Payer: Medicare Other | Admitting: Rehabilitation

## 2021-12-30 ENCOUNTER — Ambulatory Visit: Payer: Medicare Other

## 2021-12-31 ENCOUNTER — Ambulatory Visit: Payer: Medicare Other | Admitting: Physical Therapy

## 2022-01-06 ENCOUNTER — Ambulatory Visit: Payer: Medicare Other | Admitting: Rehabilitation

## 2022-01-08 ENCOUNTER — Ambulatory Visit: Payer: Medicare Other | Attending: Family Medicine | Admitting: Physical Therapy

## 2022-01-08 DIAGNOSIS — R2681 Unsteadiness on feet: Secondary | ICD-10-CM | POA: Insufficient documentation

## 2022-01-08 DIAGNOSIS — R293 Abnormal posture: Secondary | ICD-10-CM | POA: Insufficient documentation

## 2022-01-08 DIAGNOSIS — R2689 Other abnormalities of gait and mobility: Secondary | ICD-10-CM | POA: Insufficient documentation

## 2022-01-08 DIAGNOSIS — M6281 Muscle weakness (generalized): Secondary | ICD-10-CM | POA: Insufficient documentation

## 2022-01-08 NOTE — Therapy (Signed)
OUTPATIENT PHYSICAL THERAPY TREATMENT NOTE- RECERTIFICATION   Patient Name: Selena Bender MRN: [id number] DOB:Jul 25, 1951, 71 y.o., female Today's Date: 01/08/2022  PCP: Glenis Smoker, MD REFERRING PROVIDER: Courtney Heys, MD    PT End of Session - 01/08/22 0813     Visit Number 15    Number of Visits 36   Recert   Date for PT Re-Evaluation 76/81/15   Recert   Authorization Type Medicare/[employer] (needs 10th visit PN)    Progress Note Due on Visit 64    PT Start Time 0811   Pt arrived late   PT Stop Time 0847    PT Time Calculation (min) 36 min    Equipment Utilized During Treatment Other (comment)   Genu recurvatum knee orthosis on LLE and L AFO   Activity Tolerance Patient tolerated treatment well    Behavior During Therapy [facility] for tasks assessed/performed                  Past Medical History:  Diagnosis Date   Breast cancer ([place]) 2014   Right   CKD (chronic kidney disease), stage III ([place])    COVID-19 virus infection 12/2020   Hypertension    Multiple sclerosis ([place])    Seizures ([place])    Past Surgical History:  Procedure Laterality Date   BREAST SURGERY     MASTECTOMY Right 2014   Patient Active Problem List   Diagnosis Date Noted   CKD (chronic kidney disease), stage III ([place])    COVID-19 virus infection 12/2020   History of right breast cancer 03/13/2020   Status post right mastectomy 03/13/2020   Abnormal gait 09/15/2018   Claustrophobia 09/15/2018   Hypertension 09/15/2018   Localization-related epilepsy ([place]) 09/15/2018   Multiple sclerosis ([place]) 09/15/2018   Paresthesia 09/15/2018   Pain in lower limb 12/31/2015   Seizure ([place]) 12/31/2015   Neuropathic pain 06/02/2015   No advance directives 11/02/2012   Refusal of blood transfusion for reasons of conscience 11/02/2012   Malignant neoplasm of upper-outer quadrant of right breast in female, estrogen receptor positive ([place]) 11/01/2012    REFERRING DIAG: G35 (ICD-10-CM) - Multiple  sclerosis ([place])   THERAPY DIAG:  Unsteadiness on feet - Plan: PT plan of care cert/re-cert  Other abnormalities of gait and mobility - Plan: PT plan of care cert/re-cert  Muscle weakness (generalized) - Plan: PT plan of care cert/re-cert  PERTINENT HISTORY: relapsing remitting MS and breast CA and seizures. Pt has MS with LLE wekaness greater than other limbs, with coexisting debility- uses cane to walk has had multiple near falls  PRECAUTIONS: Fall   SUBJECTIVE: Pt still having pain, not doing her exercises much due to still feeling ill.   PAIN:  Are you having pain? Yes: NPRS scale: 8/10 Pain location: Entire body Pain description: nerve pain Aggravating factors: none Relieving factors: meds  OBJECTIVE:    TODAY'S TREATMENT:  Ther Ex Donned [place] AFO to L foot and pt performed 5 minutes on SciFit level 1.5 using BUE/BLEs for dynamic cardiovascular warmup, pain modulation and UE/LE coordination. Pt rated pain as 6/10 following activity.    Gait Training/Goal Assessment  Gait pattern: step through pattern, decreased stride length, decreased hip/knee flexion- Left, decreased ankle dorsiflexion- Left, circumduction- Left, Left foot flat, genu recurvatum- Left, lateral hip instability, lateral lean- Right, abducted- Left, and poor foot clearance- Left Distance walked: >500' outside on sidewalk  Assistive device utilized: Environmental consultant - 4 wheeled and old genu recurvatum brace on L knee and [place]  AFO on LLE .   Level of assistance: SBA  Comments: Pt demonstrated scuffing of LLE w/continued ER at hip and slow cadence. Noted difficulty w/inclines/declines, requiring pt to stop and reset to prevent anterior/posterior LOB but no assistance needed by therapist. Pt reported 5/10 pain following walking.    STAIRS:  Level of Assistance: SBA Stair Negotiation Technique: Step to Pattern with Bilateral Rails  Number of Stairs: 4   Height of Stairs: 6"  Comments: Pt demonstrated good  technique to ascend but lead w/RLE to descend and had notable difficulty clearing LLE from step and controlling descent. Educated pt on proper technique but due to time constraints, was unable to practice.    PATIENT EDUCATION: Education details: Goal assessment, recert, bringing new genu recurvatum brace to [facility] appointment today  Person educated: Patient  Education method: Explanation  Education comprehension: verbalized understanding     HOME EXERCISE PROGRAM: Access Code: [email or web address] URL: [email or web address] Date: 12/15/2021 Prepared by: Cameron Sprang  Exercises - Sit to Stand Without Arm Support  - 1 x daily - 7 x weekly - 1 sets - 10 reps - Supine Bridge  - 1 x daily - 7 x weekly - 1 sets - 10 reps - Standing March with Counter Support  - 1 x daily - 7 x weekly - 1 sets - 10 reps - Standing Hip Abduction with Counter Support  - 1 x daily - 7 x weekly - 1 sets - 10 reps - Standing Hip Extension with Counter Support  - 1 x daily - 7 x weekly - 1 sets - 10 reps - Side Stepping with Counter Support  - 1 x daily - 7 x weekly - 1 sets - 10 reps    GOALS: Goals reviewed with patient? Yes     OLD LONG TERM GOALS: Target date: 12/31/2021   Pt will be IND with final HEP in order to indicate improved functional mobility and dec fall risk. Baseline:  Goal status: PROGRESSING   2.  Pt will improve gait speed to >/=2.6 ft/sec w/ LRAD in order to indicate dec fall risk.   Baseline: 0.92 ft/sec with quad tip cane and min/guard; 0.65ft/s with SBA and quad tip st. Cane 11/24/21 Goal status: progressing continue   3.  Pt will negotiate up/down 4 steps with B rails, up/down ramp and curb and ambulate x 500' outdoors over unlevel paved surfaces at S level with LRAD in order to indicate improved community mobility.    Baseline: Pt has met gait and stair goal, did not test curb or ramp due to pt's pain and time constraints as pt arrived late.  Goal status: PARTIALLY MET   4.   Pt will improve BERG balance score 8 points from baseline in order to indicate dec fall risk.    Baseline: 32/56 on 11/10/21; 41/56 11/25/19, 46/56 on 12/28/21 Goal status: MET  5.  Pt will improve TUG to </=20 secs w/ LRAD (and brace as indicated) at S level in order to indicate dec fall risk    Baseline: 28.89 secs was last assessment (with rollator and knee/AFO brace), 22.25 on 12/28/21 with her old knee cage and AFO) Goal status: MET   NEW SHORT TERM GOALS FOR EXTENDED POC:   Target date: 02/05/2022  Pt will ascend/descend 12 6" steps w/B rails and step-to pattern using good technique mod I for improved functional mobility  Baseline: Step-to pattern, B rails, poor technique w/descending  Goal status: INITIAL  2.  Pt will improve  gait speed to >/=1.5 ft/sec w/ LRAD in order to indicate dec fall risk. Baseline: 0.82 ft/s w/quad cane and SBA Goal status: INITIAL  3.  5xSTS to be performed and STG/LTG written  Baseline:  Goal status: INITIAL   NEW LONG TERM GOALS FOR UPDATED POC:  Target date: 03/05/2022  Pt will be independent with final HEP in order to indicate improved functional mobility and dec fall risk. Baseline:  Goal status: INITIAL  2.  Pt will obtain personal AFO and initiate wear schedule at home for improved safety with gait and community mobility  Baseline: Pt to meet with Gerald Stabs at [place] on 6/2 Goal status: INITIAL  3.  5x STS goal  Baseline:  Goal status: INITIAL  4.  Pt will improve Berg score to 51/56 for decreased fall risk  Baseline: 46/56 Goal status: INITIAL     ASSESSMENT:   CLINICAL IMPRESSION: Skilled PT session limited due to pt arriving late. Completed LTG assessment today and pt partially met her outdoor gait/curb/ramp/stairs goal. Pt able to ambulate >500' w/rollator, L AFO and L genu recurvatum brace but has slow cadence and continues to demonstrate LLE scuffing on sidewalk. No LOB noted. Pt able to ascend/descend 4 6" steps w/B rails and  step-to pattern but demonstrated poor technique while descending, resulting in excessive ER of LLE even with brace donned. Pt to have appointment at [facility] today to discuss options for bracing of LLE. Pt in agreement to add 8 more weeks to POC at 2x/week for continued gains in ambulation, safety, strength and balance.      OBJECTIVE IMPAIRMENTS Abnormal gait, decreased activity tolerance, decreased balance, decreased coordination, decreased endurance, decreased knowledge of use of DME, decreased mobility, difficulty walking, decreased ROM, decreased strength, hypomobility, impaired perceived functional ability, impaired flexibility, impaired sensation, and postural dysfunction.    ACTIVITY LIMITATIONS community activity, meal prep, laundry, and shopping.    PERSONAL FACTORS Past/current experiences, Time since onset of injury/illness/exacerbation, and 1-2 comorbidities: see above  are also affecting patient's functional outcome.      REHAB POTENTIAL: Good   CLINICAL DECISION MAKING: Evolving/moderate complexity   EVALUATION COMPLEXITY: Moderate   PLAN: PT FREQUENCY: 2x/week   PT DURATION: 16 weeks (recert)    PLANNED INTERVENTIONS: Therapeutic exercises, Therapeutic activity, Neuromuscular re-education, Balance training, Gait training, Patient/Family education, Vestibular training, DME instructions, Aquatic Therapy, Electrical stimulation, and Manual therapy   PLAN FOR NEXT SESSION:  How was [facility] appointment? How is HEP? sit<>stand practice, heel cord stretch. LLE strengthening.  Gait training w/rollator, AFO and OLD recurvatum brace.  Practice w/turns and navigating obstacles. Eccentric quad strengthening, stair training    Cameran Pettey E Candi Profit, PT, DPT 01/08/22, 9:47 AM

## 2022-01-12 ENCOUNTER — Ambulatory Visit: Payer: Medicare Other | Admitting: Rehabilitation

## 2022-01-12 ENCOUNTER — Encounter: Payer: Self-pay | Admitting: Rehabilitation

## 2022-01-12 DIAGNOSIS — R2689 Other abnormalities of gait and mobility: Secondary | ICD-10-CM | POA: Diagnosis not present

## 2022-01-12 DIAGNOSIS — R293 Abnormal posture: Secondary | ICD-10-CM | POA: Diagnosis not present

## 2022-01-12 DIAGNOSIS — M6281 Muscle weakness (generalized): Secondary | ICD-10-CM

## 2022-01-12 DIAGNOSIS — R2681 Unsteadiness on feet: Secondary | ICD-10-CM | POA: Diagnosis not present

## 2022-01-12 NOTE — Therapy (Signed)
OUTPATIENT PHYSICAL THERAPY TREATMENT NOTE- RECERTIFICATION   Patient Name: Selena Bender MRN: [id number] DOB:1950-11-20, 71 y.o., female Today's Date: 01/12/2022  PCP: Glenis Smoker, MD REFERRING PROVIDER: Courtney Heys, MD    PT End of Session - 01/12/22 1438     Visit Number 16    Number of Visits 4   Recert   Date for PT Re-Evaluation 74/08/14   Recert   Authorization Type Medicare/Cign[employer] (needs 10th visit PN)    Progress Note Due on Visit 38    PT Start Time 24    PT Stop Time 1315    PT Time Calculation (min) 45 min    Equipment Utilized During Treatment Other (comment)   Genu recurvatum knee orthosis on LLE and L AFO   Activity Tolerance Patient tolerated treatment well    Behavior During Therapy [facility] for tasks assessed/performed                  Past Medical History:  Diagnosis Date   Breast cancer ([place]) 2014   Right   CKD (chronic kidney disease), stage III ([place])    COVID-19 virus infection 12/2020   Hypertension    Multiple sclerosis ([place])    Seizures ([place])    Past Surgical History:  Procedure Laterality Date   BREAST SURGERY     MASTECTOMY Right 2014   Patient Active Problem List   Diagnosis Date Noted   CKD (chronic kidney disease), stage III ([place])    COVID-19 virus infection 12/2020   History of right breast cancer 03/13/2020   Status post right mastectomy 03/13/2020   Abnormal gait 09/15/2018   Claustrophobia 09/15/2018   Hypertension 09/15/2018   Localization-related epilepsy ([place]) 09/15/2018   Multiple sclerosis ([place]) 09/15/2018   Paresthesia 09/15/2018   Pain in lower limb 12/31/2015   Seizure ([place]) 12/31/2015   Neuropathic pain 06/02/2015   No advance directives 11/02/2012   Refusal of blood transfusion for reasons of conscience 11/02/2012   Malignant neoplasm of upper-outer quadrant of right breast in female, estrogen receptor positive ([place]) 11/01/2012    REFERRING DIAG: G35 (ICD-10-CM) - Multiple sclerosis ([place])    THERAPY DIAG:  Unsteadiness on feet  Other abnormalities of gait and mobility  Muscle weakness (generalized)  Abnormal posture  PERTINENT HISTORY: relapsing remitting MS and breast CA and seizures. Pt has MS with LLE wekaness greater than other limbs, with coexisting debility- uses cane to walk has had multiple near falls  PRECAUTIONS: Fall   SUBJECTIVE: Pt still having pain, not doing her exercises much due to still feeling ill.   PAIN:  Are you having pain? Yes: NPRS scale: 8/10 Pain location: Entire body Pain description: nerve pain Aggravating factors: none Relieving factors: meds  OBJECTIVE:    TODAY'S TREATMENT:  NMR:  Forward step ups (leaving LLE on 6" step) with BUE support x 10 reps>single UE support x 10 reps with tactile and verbal cues for improved L hip extension when in full stance position.  Lateral step up/downs to 6" step with BUE support x 8 reps each direction.  Pt initially needing assist for placement of LLE, however she improved within task and was able to place on her own.  LLE tapping to small red/black balance beam x 10 reps with light UE support>RLE tapping with emphasis on LLE stance control x 10 reps.  LLE stepping over yardstick, forward weight shift onto LLE, then returning LLE to starting point x 10 reps with UE support and cues for improved knee  flexion.      Gait Training/Goal Assessment  Gait pattern: step through pattern, decreased stride length, decreased hip/knee flexion- Left, decreased ankle dorsiflexion- Left, circumduction- Left, Left foot flat, genu recurvatum- Left, lateral hip instability, lateral lean- Right, abducted- Left, and poor foot clearance- Left Distance walked: 230', 115' x 2 reps  Assistive device utilized: Environmental consultant - 4 wheeled and old genu recurvatum brace on L knee and [place] AFO on LLE .  Also did a rep with L knee cage and foot up brace as [facility] suggested.  Level of assistance: SBA  Comments: Pt continued to have  significant L foot drag and ER when wearing foot up brace.  PT even re-adjusted to lower placement for more assist with only slight improvement.  PT then re-donned [place] PLS AFO with knee cage with marked improvement in med/lat stability with less ER.     STAIRS:     PATIENT EDUCATION: Education details: Goal assessment, recert, bringing new genu recurvatum brace to [facility] appointment today  Person educated: Patient  Education method: Explanation  Education comprehension: verbalized understanding     HOME EXERCISE PROGRAM: Access Code: [email or web address] URL: [email or web address] Date: 12/15/2021 Prepared by: Cameron Sprang  Exercises - Sit to Stand Without Arm Support  - 1 x daily - 7 x weekly - 1 sets - 10 reps - Supine Bridge  - 1 x daily - 7 x weekly - 1 sets - 10 reps - Standing March with Counter Support  - 1 x daily - 7 x weekly - 1 sets - 10 reps - Standing Hip Abduction with Counter Support  - 1 x daily - 7 x weekly - 1 sets - 10 reps - Standing Hip Extension with Counter Support  - 1 x daily - 7 x weekly - 1 sets - 10 reps - Side Stepping with Counter Support  - 1 x daily - 7 x weekly - 1 sets - 10 reps    GOALS: Goals reviewed with patient? Yes     OLD LONG TERM GOALS: Target date: 12/31/2021   Pt will be IND with final HEP in order to indicate improved functional mobility and dec fall risk. Baseline:  Goal status: PROGRESSING   2.  Pt will improve gait speed to >/=2.6 ft/sec w/ LRAD in order to indicate dec fall risk.   Baseline: 0.92 ft/sec with quad tip cane and min/guard; 0.90f/s with SBA and quad tip st. Cane 11/24/21 Goal status: progressing continue   3.  Pt will negotiate up/down 4 steps with B rails, up/down ramp and curb and ambulate x 500' outdoors over unlevel paved surfaces at S level with LRAD in order to indicate improved community mobility.    Baseline: Pt has met gait and stair goal, did not test curb or ramp due to pt's pain and time  constraints as pt arrived late.  Goal status: PARTIALLY MET   4.  Pt will improve BERG balance score 8 points from baseline in order to indicate dec fall risk.    Baseline: 32/56 on 11/10/21; 41/56 11/25/19, 46/56 on 12/28/21 Goal status: MET  5.  Pt will improve TUG to </=20 secs w/ LRAD (and brace as indicated) at S level in order to indicate dec fall risk    Baseline: 28.89 secs was last assessment (with rollator and knee/AFO brace), 22.25 on 12/28/21 with her old knee cage and AFO) Goal status: MET   NEW SHORT TERM GOALS FOR EXTENDED POC:   Target date: 02/05/2022  Pt will  ascend/descend 12 6" steps w/B rails and step-to pattern using good technique mod I for improved functional mobility  Baseline: Step-to pattern, B rails, poor technique w/descending  Goal status: INITIAL  2.  Pt will improve gait speed to >/=1.5 ft/sec w/ LRAD in order to indicate dec fall risk. Baseline: 0.82 ft/s w/quad cane and SBA Goal status: INITIAL  3.  5xSTS to be performed and STG/LTG written  Baseline:  Goal status: INITIAL   NEW LONG TERM GOALS FOR UPDATED POC:  Target date: 03/05/2022  Pt will be independent with final HEP in order to indicate improved functional mobility and dec fall risk. Baseline:  Goal status: INITIAL  2.  Pt will obtain personal AFO and initiate wear schedule at home for improved safety with gait and community mobility  Baseline: Pt to meet with Gerald Stabs at [place] on 6/2 Goal status: INITIAL  3.  5x STS goal  Baseline:  Goal status: INITIAL  4.  Pt will improve Berg score to 51/56 for decreased fall risk  Baseline: 46/56 Goal status: INITIAL     ASSESSMENT:   CLINICAL IMPRESSION: Pt reporting that [facility] recommended foot up brace with her knee cage, therefore PT tried to re-create this in therapy session.  Note that she continues to have significant foot drag and ER with only foot up brace vs PLS AFO and knee cage.  PT to reach out to [facility] to have discussion about  best option for pt.  Also continue to work on hip and quad strengthening in open and closed chain tasks.       OBJECTIVE IMPAIRMENTS Abnormal gait, decreased activity tolerance, decreased balance, decreased coordination, decreased endurance, decreased knowledge of use of DME, decreased mobility, difficulty walking, decreased ROM, decreased strength, hypomobility, impaired perceived functional ability, impaired flexibility, impaired sensation, and postural dysfunction.    ACTIVITY LIMITATIONS community activity, meal prep, laundry, and shopping.    PERSONAL FACTORS Past/current experiences, Time since onset of injury/illness/exacerbation, and 1-2 comorbidities: see above  are also affecting patient's functional outcome.      REHAB POTENTIAL: Good   CLINICAL DECISION MAKING: Evolving/moderate complexity   EVALUATION COMPLEXITY: Moderate   PLAN: PT FREQUENCY: 2x/week   PT DURATION: 16 weeks (recert)    PLANNED INTERVENTIONS: Therapeutic exercises, Therapeutic activity, Neuromuscular re-education, Balance training, Gait training, Patient/Family education, Vestibular training, DME instructions, Aquatic Therapy, Electrical stimulation, and Manual therapy   PLAN FOR NEXT SESSION:  How is HEP? sit<>stand practice, heel cord stretch. LLE strengthening.  Gait training w/rollator, AFO and OLD recurvatum brace.  Practice w/turns and navigating obstacles. Eccentric quad strengthening, stair training    Cameron Sprang, PT, MPT [facility] [address] [place], [place], [address] Phone: [phone]   Fax:  [phone] 01/12/22, 2:39 PM

## 2022-01-14 ENCOUNTER — Ambulatory Visit: Payer: Medicare Other | Admitting: Physical Therapy

## 2022-01-14 ENCOUNTER — Telehealth: Payer: Self-pay | Admitting: Physical Therapy

## 2022-01-14 DIAGNOSIS — R2681 Unsteadiness on feet: Secondary | ICD-10-CM | POA: Diagnosis not present

## 2022-01-14 DIAGNOSIS — R2689 Other abnormalities of gait and mobility: Secondary | ICD-10-CM

## 2022-01-14 DIAGNOSIS — R293 Abnormal posture: Secondary | ICD-10-CM | POA: Diagnosis not present

## 2022-01-14 DIAGNOSIS — M6281 Muscle weakness (generalized): Secondary | ICD-10-CM | POA: Diagnosis not present

## 2022-01-14 NOTE — Telephone Encounter (Signed)
Dr. Marlou Sa, Selena Bender Selena Bender is being treated by PT for her MS and gait abnormalities due to chronic left knee hyperextension and weakness. Recently, she has been very limited in therapy due to high levels of pain and today she reported new medial left knee pain that exacerbated her knee buckling with activity.   I saw she was to obtain a MRI for her left knee (order placed in March of 2023), but she was never scheduled for one and the order you placed in March was cancelled this week. Do you mind placing another order for a left knee MRI for her?   If you agree, please place an order in [facility] workque in [facility] or fax the order to [phone]. Thank you, [name], PT, [place] [address] [place], [place]  [address] Phone:  [phone] Fax:  [phone]

## 2022-01-14 NOTE — Telephone Encounter (Signed)
Sure does she have her brace?

## 2022-01-14 NOTE — Therapy (Signed)
OUTPATIENT PHYSICAL THERAPY TREATMENT NOTE  Patient Name: Selena Bender MRN: [id number] DOB:06/26/1951, 71 y.o., female Today's Date: 01/14/2022  PCP: Glenis Smoker, MD REFERRING PROVIDER: Courtney Heys, MD    PT End of Session - 01/14/22 1022     Visit Number 17    Number of Visits 32   Recert   Date for PT Re-Evaluation 22/29/79   Recert   Authorization Type Medicare/[employer] (needs 10th visit PN)    Progress Note Due on Visit 20    PT Start Time 1021   Previous pt session ran late   PT Stop Time 1057    PT Time Calculation (min) 36 min    Equipment Utilized During Treatment Other (comment)   Genu recurvatum knee orthosis on LLE   Activity Tolerance Patient limited by pain    Behavior During Therapy [facility] for tasks assessed/performed                  Past Medical History:  Diagnosis Date   Breast cancer ([place]) 2014   Right   CKD (chronic kidney disease), stage III ([place])    COVID-19 virus infection 12/2020   Hypertension    Multiple sclerosis ([place])    Seizures ([place])    Past Surgical History:  Procedure Laterality Date   BREAST SURGERY     MASTECTOMY Right 2014   Patient Active Problem List   Diagnosis Date Noted   CKD (chronic kidney disease), stage III ([place])    COVID-19 virus infection 12/2020   History of right breast cancer 03/13/2020   Status post right mastectomy 03/13/2020   Abnormal gait 09/15/2018   Claustrophobia 09/15/2018   Hypertension 09/15/2018   Localization-related epilepsy ([place]) 09/15/2018   Multiple sclerosis ([place]) 09/15/2018   Paresthesia 09/15/2018   Pain in lower limb 12/31/2015   Seizure ([place]) 12/31/2015   Neuropathic pain 06/02/2015   No advance directives 11/02/2012   Refusal of blood transfusion for reasons of conscience 11/02/2012   Malignant neoplasm of upper-outer quadrant of right breast in female, estrogen receptor positive ([place]) 11/01/2012    REFERRING DIAG: G35 (ICD-10-CM) - Multiple sclerosis ([place])    THERAPY DIAG:  Unsteadiness on feet  Other abnormalities of gait and mobility  Muscle weakness (generalized)  PERTINENT HISTORY: relapsing remitting MS and breast CA and seizures. Pt has MS with LLE wekaness greater than other limbs, with coexisting debility- uses cane to walk has had multiple near falls  PRECAUTIONS: Fall   SUBJECTIVE: Pt having a lot of pain today, has not been performing HEP often due to pain. Received her foot-up brace yesterday but has not obtained shoes to properly wear brace yet. Will continue looking today. No other changes   PAIN:  Are you having pain? Yes: NPRS scale: 9/10 Pain location: Entire body Pain description: nerve pain Aggravating factors: none Relieving factors: meds  OBJECTIVE:    TODAY'S TREATMENT:  Ther Ex  SciFit level 3 for 9 minutes using BUE/BLEs for dynamic cardiovascular conditioning, UE/LE coordination and pain modulation. Pt reported no change in pain following intervention and increase in medial knee pain.   In // bars attempted forward/retro lunges to colored dots as targets w/RLE only to work on isometric L quad strength and facilitate flexion of L knee, but pt unable to tolerate due to high pain levels and L knee buckling, requiring max A to prevent fall.   Added L ankle DF stretches to HEP (see bolded below)   Self-care/home management  Lengthy discussion regarding contacting  Dr. Marlou Sa to obtain new MRI referral (old one was cancelled on Tuesday and no one ever contacted pt to schedule). Pt very frustrated w/poor pain management and verbalized new medial joint pain in L knee that she has not felt before. Therapist will contact Dr. Marlou Sa today as well on pt's behalf. Also discussed obtaining new shoes for foot-up brace.     PATIENT EDUCATION: Education details: See self-care section  Person educated: Patient  Education method: Explanation  Education comprehension: verbalized understanding     HOME EXERCISE PROGRAM: Access  Code: [email or web address] URL: [email or web address] Date: 01/14/2022 Prepared by: Mickie Bail Shahira Fiske  Exercises - Sit to Stand Without Arm Support  - 1 x daily - 7 x weekly - 1 sets - 10 reps - Supine Bridge  - 1 x daily - 7 x weekly - 1 sets - 10 reps - Standing March with Counter Support  - 1 x daily - 7 x weekly - 1 sets - 10 reps - Standing Hip Abduction with Counter Support  - 1 x daily - 7 x weekly - 1 sets - 10 reps - Standing Hip Extension with Counter Support  - 1 x daily - 7 x weekly - 1 sets - 10 reps - Side Stepping with Counter Support  - 1 x daily - 7 x weekly - 1 sets - 10 reps - Seated Ankle Pumps  - 1 x daily - 7 x weekly - 3 sets - 10 reps - Standing Ankle Dorsiflexion Stretch  - 1 x daily - 7 x weekly - 3 sets - 10 reps - 30 second hold    GOALS: Goals reviewed with patient? Yes    NEW SHORT TERM GOALS FOR EXTENDED POC:   Target date: 02/05/2022  Pt will ascend/descend 12 6" steps w/B rails and step-to pattern using good technique mod I for improved functional mobility  Baseline: Step-to pattern, B rails, poor technique w/descending  Goal status: INITIAL  2.  Pt will improve gait speed to >/=1.5 ft/sec w/ LRAD in order to indicate dec fall risk. Baseline: 0.82 ft/s w/quad cane and SBA Goal status: INITIAL  3.  5xSTS to be performed and STG/LTG written  Baseline:  Goal status: INITIAL   NEW LONG TERM GOALS FOR UPDATED POC:  Target date: 03/05/2022  Pt will be independent with final HEP in order to indicate improved functional mobility and dec fall risk. Baseline:  Goal status: INITIAL  2.  Pt will obtain personal AFO and initiate wear schedule at home for improved safety with gait and community mobility  Baseline: Pt to meet with Gerald Stabs at [place] on 6/2 Goal status: INITIAL  3.  5x STS goal  Baseline:  Goal status: INITIAL  4.  Pt will improve Berg score to 51/56 for decreased fall risk  Baseline: 46/56 Goal status: INITIAL     ASSESSMENT:    CLINICAL IMPRESSION: Skilled PT session limited due to high levels of pain and L knee buckling throughout session. Pt attempted to perform strengthening exercises but her L knee buckled x3, requiring max A from therapist to prevent fall. Pt stated she was in too much pain to tolerate session and reported new medial knee pain that she has not experienced before. Encouraged pt to call Dr. Randel Pigg office today to obtain new referral for L knee MRI, as her old referral was cancelled for unknown reasons. Continue POC.      OBJECTIVE IMPAIRMENTS Abnormal gait, decreased activity tolerance, decreased balance, decreased coordination, decreased endurance, decreased knowledge  of use of DME, decreased mobility, difficulty walking, decreased ROM, decreased strength, hypomobility, impaired perceived functional ability, impaired flexibility, impaired sensation, and postural dysfunction.    ACTIVITY LIMITATIONS community activity, meal prep, laundry, and shopping.    PERSONAL FACTORS Past/current experiences, Time since onset of injury/illness/exacerbation, and 1-2 comorbidities: see above  are also affecting patient's functional outcome.      REHAB POTENTIAL: Good   CLINICAL DECISION MAKING: Evolving/moderate complexity   EVALUATION COMPLEXITY: Moderate   PLAN: PT FREQUENCY: 2x/week   PT DURATION: 16 weeks (recert)    PLANNED INTERVENTIONS: Therapeutic exercises, Therapeutic activity, Neuromuscular re-education, Balance training, Gait training, Patient/Family education, Vestibular training, DME instructions, Aquatic Therapy, Electrical stimulation, and Manual therapy   PLAN FOR NEXT SESSION:  Did she call Dr. Marlou Sa? How is pain? How is HEP? sit<>stand practice, heel cord stretch. LLE strengthening.  Gait training w/rollator, AFO and OLD recurvatum brace.  Practice w/turns and navigating obstacles. Eccentric quad strengthening, stair training, fwd/retro lunges, weighted step-ups, TM training on inclines.     Cruzita Lederer Tashyra Adduci, PT, DPT 01/14/22, 11:20 AM

## 2022-01-15 NOTE — Telephone Encounter (Signed)
thx

## 2022-01-19 ENCOUNTER — Ambulatory Visit: Payer: Medicare Other | Admitting: Rehabilitation

## 2022-01-19 DIAGNOSIS — R2681 Unsteadiness on feet: Secondary | ICD-10-CM

## 2022-01-19 NOTE — Therapy (Signed)
[facility] [address] [place], [place], [address] Phone: [phone]   Fax:  [phone]  Patient Details  Name: Selena Bender MRN: [id number] Date of Birth: 22-Oct-1950 Referring Provider:  Glenis Smoker, *  Encounter Date: 01/19/2022   Pt arrived for session, still having very high pain.  PT and pt discussed placing her on hold until she can see Dr. Dagoberto Ligas at the end of the month for better pain management.  She reports that she did get new shoes for foot up brace, however they were too small, so has placed new order and should have by end of week.  Told her that she could bring them in Friday afternoon for Jannah to place foot up brace in shoe.  PT to follow up with [facility] regarding knee cage.  Also highly recommend she call Dr. Randel Pigg office as he did respond to previous PTs message that it was okay to schedule MRI of L knee, however no one has contacted her yet.  Pt verbalized understanding and agrees to all.   Cameron Sprang, PT, MPT [facility] [address] [place], [place], [address] Phone: [phone]   Fax:  [phone] 01/19/22, 1:54 PM

## 2022-01-21 ENCOUNTER — Ambulatory Visit: Payer: Medicare Other | Admitting: Physical Therapy

## 2022-01-26 ENCOUNTER — Ambulatory Visit: Payer: Medicare Other | Admitting: Rehabilitation

## 2022-01-28 ENCOUNTER — Ambulatory Visit: Payer: Medicare Other | Admitting: Physical Therapy

## 2022-02-02 ENCOUNTER — Ambulatory Visit: Payer: Medicare Other | Admitting: Rehabilitation

## 2022-02-03 DIAGNOSIS — R569 Unspecified convulsions: Secondary | ICD-10-CM | POA: Diagnosis not present

## 2022-02-03 DIAGNOSIS — G8929 Other chronic pain: Secondary | ICD-10-CM | POA: Diagnosis not present

## 2022-02-03 DIAGNOSIS — R1312 Dysphagia, oropharyngeal phase: Secondary | ICD-10-CM | POA: Diagnosis not present

## 2022-02-03 DIAGNOSIS — E7800 Pure hypercholesterolemia, unspecified: Secondary | ICD-10-CM | POA: Diagnosis not present

## 2022-02-03 DIAGNOSIS — R1310 Dysphagia, unspecified: Secondary | ICD-10-CM | POA: Diagnosis not present

## 2022-02-03 DIAGNOSIS — C50911 Malignant neoplasm of unspecified site of right female breast: Secondary | ICD-10-CM | POA: Diagnosis not present

## 2022-02-03 DIAGNOSIS — Z0000 Encounter for general adult medical examination without abnormal findings: Secondary | ICD-10-CM | POA: Diagnosis not present

## 2022-02-03 DIAGNOSIS — R899 Unspecified abnormal finding in specimens from other organs, systems and tissues: Secondary | ICD-10-CM | POA: Diagnosis not present

## 2022-02-03 DIAGNOSIS — I10 Essential (primary) hypertension: Secondary | ICD-10-CM | POA: Diagnosis not present

## 2022-02-04 ENCOUNTER — Ambulatory Visit: Payer: Medicare Other | Admitting: Physical Therapy

## 2022-02-05 ENCOUNTER — Encounter: Payer: Self-pay | Admitting: Physical Medicine and Rehabilitation

## 2022-02-05 ENCOUNTER — Encounter
Payer: Medicare Other | Attending: Physical Medicine and Rehabilitation | Admitting: Physical Medicine and Rehabilitation

## 2022-02-05 VITALS — BP 148/89 | HR 79 | Ht 65.0 in | Wt 147.0 lb

## 2022-02-05 DIAGNOSIS — M79604 Pain in right leg: Secondary | ICD-10-CM

## 2022-02-05 DIAGNOSIS — Z9011 Acquired absence of right breast and nipple: Secondary | ICD-10-CM | POA: Diagnosis not present

## 2022-02-05 DIAGNOSIS — G894 Chronic pain syndrome: Secondary | ICD-10-CM

## 2022-02-05 DIAGNOSIS — Z5181 Encounter for therapeutic drug level monitoring: Secondary | ICD-10-CM

## 2022-02-05 DIAGNOSIS — Z79891 Long term (current) use of opiate analgesic: Secondary | ICD-10-CM | POA: Diagnosis not present

## 2022-02-05 DIAGNOSIS — M792 Neuralgia and neuritis, unspecified: Secondary | ICD-10-CM | POA: Diagnosis not present

## 2022-02-05 DIAGNOSIS — G35 Multiple sclerosis: Secondary | ICD-10-CM | POA: Diagnosis not present

## 2022-02-05 DIAGNOSIS — M79605 Pain in left leg: Secondary | ICD-10-CM

## 2022-02-05 MED ORDER — PROMETHAZINE HCL 12.5 MG PO TABS: 12.5000 mg | ORAL_TABLET | Freq: Four times a day (QID) | ORAL | 1 refills | Status: DC | PRN

## 2022-02-05 MED ORDER — DULOXETINE HCL 30 MG PO CPEP: 30.0000 mg | ORAL_CAPSULE | Freq: Every day | ORAL | 5 refills | Status: DC

## 2022-02-05 MED ORDER — TRAMADOL HCL 50 MG PO TABS: 50.0000 mg | ORAL_TABLET | Freq: Four times a day (QID) | ORAL | 0 refills | Status: DC | PRN

## 2022-02-05 NOTE — Patient Instructions (Addendum)
Pt is a 71 yr old R handed female with hx of relapsing remitting MS and breast CA and seizures here for evaluation of chronic pain in B/L arms and chest due to mastectomy- dx'd 2014- and in remission.  A lot of nerve pain, likely from MS.  Here for f/u on chronic pain/MS.    Cymbalta/Duloxetine 30 mg daily- x  1 week, then 60 mg daily- - discussed side effects- can be 1-2% nausea; prolonged time til orgasm; and dry mouth/constipation-  Usually has improvement in pain within 3-7 days of being on 60 mg daily.   3.  Asking about getting ahold of legal marijuana due to for autoimmune dx's/Spasticity due to MS;   in [place]- sending me paperwork about this. Was sent to my email. Filled out for pt's MS. If Tramadol doesn't work, then already has paperwork if needed for Medical marijuana.   4.   Tramadol 50-100 mg 2x/day as needed for severe spikes in pain. 50 mg every 6 hours as needed or 2 pills/100 mg 2x/day- with a dose of tylenol- can last up to 10-12 hours with tylenol.  1 week supply.   5. Opiate contract and UDS today- and if OK, will send in refills on tramadol.    6. Educated on serotonin syndrome- I've seen it before- feels flushing, heart rate fast- I've only seen if someone restarts meds at high dose after running out- but keep a watch initially , just in case.    7. F/U in 3 months- on MS and nerve pain  8. Send in phenergan 12.5 mg take 1 q6 hours as needed for nausea due to Cymbalta.

## 2022-02-05 NOTE — Progress Notes (Signed)
Subjective:    Patient ID: Selena Bender, female    DOB: 1951/08/01, 71 y.o.   MRN: [id number]  HPI Pt is a 71 yr old R handed female with hx of relapsing remitting MS and breast CA and seizures here for evaluation of chronic pain in B/L arms and chest due to mastectomy- dx'd 2014- and in remission.  A lot of nerve pain, likely from MS.  Here for f/u on nerve pain/chronic pain.    Patient rates pain as 8/10.    Was supposed to call back Dr Marlou Sa- latest appt was cancelled-  Never got to see him. Hasn't gotten knee MRI yet that was ordered by Dr Marlou Sa.   No side effects from naltrexone, but didn't work- didn't even take edge off.    Pain is really causing so much problem.  Never had pain- started to get worse recently- in the last 1-2 years.  Burning pain- - being on fire- is likely due to [place].   Mainly has pain in legs , but also has pain in neck as well that radiates down arms-  Aching and throbbing pain.    MRI from  October 2021- Severe B/L foraminal stenosis at C4/5 and C5/6-  And mass effect on cord at C4/5/6 on Left.      Pain Inventory Average Pain 8 Pain Right Now 8 My pain is constant, sharp, burning, stabbing, tingling, and aching  LOCATION OF PAIN  Hips down to toes  BOWEL Number of stools per week: 7   BLADDER Normal  Difficulty starting stream Yes  Incomplete bladder emptying No    Mobility walk with assistance use a cane use a walker how many minutes can you walk? 30 ability to climb steps?  yes do you drive?  no Do you have any goals in this area?  yes  Function retired I need assistance with the following:  household duties and shopping Do you have any goals in this area?  yes  Neuro/Psych weakness numbness tremor tingling trouble walking spasms dizziness depression anxiety  Prior Studies Any changes since last visit?  yes x-rays chest  Physicians involved in your care Any changes since last visit?  no   Family  History  Problem Relation Age of Onset   Sickle cell anemia Father    Social History   Socioeconomic History   Marital status: Widowed    Spouse name: Not on file   Number of children: 2   Years of education: Not on file   Highest education level: Not on file  Occupational History   Not on file  Tobacco Use   Smoking status: Former    Types: Cigarettes   Smokeless tobacco: Never  Vaping Use   Vaping Use: Never used  Substance and Sexual Activity   Alcohol use: Yes    Comment: occ   Drug use: Not Currently    Types: Marijuana    Comment: Gummies   Sexual activity: Not Currently  Other Topics Concern   Not on file  Social History Narrative   Not on file   Social Determinants of Health   Financial Resource Strain: Not on file  Food Insecurity: Not on file  Transportation Needs: Not on file  Physical Activity: Not on file  Stress: Not on file  Social Connections: Not on file   Past Surgical History:  Procedure Laterality Date   BREAST SURGERY     MASTECTOMY Right 2014   Past Medical History:  Diagnosis Date  Breast cancer ([place]) 2014   Right   CKD (chronic kidney disease), stage III ([place])    COVID-19 virus infection 12/2020   Hypertension    Multiple sclerosis ([facility])    Seizures ([facility])    Ht '5\' 5"'$  (1.651 m)   Wt 147 lb (66.7 kg)   BMI 24.46 kg/m   Opioid Risk Score:   Fall Risk Score:  `1  Depression screen PHQ 2/9     02/05/2022    1:42 PM 10/28/2021   10:45 AM 09/03/2020   10:07 AM 06/02/2020    2:30 PM 04/17/2020   11:01 AM 11/30/2019   11:09 AM 05/31/2019    9:57 AM  Depression screen PHQ 2/9  Decreased Interest 1 2 0 0 0 0 1  Down, Depressed, Hopeless 1 2 0 0 0 0 1  PHQ - 2 Score 2 4 0 0 0 0 2  Altered sleeping  2     0  Tired, decreased energy  3     0  Change in appetite  0     0  Feeling bad or failure about yourself   0     1  Trouble concentrating  1     0  Moving slowly or fidgety/restless  2     0  Suicidal thoughts  0     0  PHQ-9  Score  12     3  Difficult doing work/chores  Extremely dIfficult         Review of Systems  Musculoskeletal:  Positive for gait problem.  Neurological:  Positive for dizziness, tremors, weakness and numbness.       Spasms, Tremors  Psychiatric/Behavioral:         Depression, anxiety       Objective:   Physical Exam  Awake, alert, sitting on table; accompanied by daughter, using quad cane to walk,  Has to lean on daughter to get around.        Assessment & Plan:   Pt is a 71 yr old R handed female with hx of relapsing remitting MS and breast CA and seizures here for evaluation of chronic pain in B/L arms and chest due to mastectomy- dx'd 2014- and in remission.  A lot of nerve pain, likely from MS.  Here for f/u on chronic pain/MS.    Cymbalta/Duloxetine 30 mg daily- x  1 week, then 60 mg daily- - discussed side effects- can be 1-2% nausea; prolonged time til orgasm; and dry mouth/constipation-  Usually has improvement in pain within 3-7 days of being on 60 mg daily.   3.  Asking about getting ahold of legal marijuana due to for autoimmune dx's/Spasticity due to MS;   in [place]- sending me paperwork about this. Was sent to my email. Filled out for pt's MS. If Tramadol doesn't work, then already has paperwork if needed for Medical marijuana.   4.   Tramadol 50-100 mg 2x/day as needed for severe spikes in pain. 50 mg every 6 hours as needed or 2 pills/100 mg 2x/day- with a dose of tylenol- can last up to 10-12 hours with tylenol.  1 week supply.   5. Opiate contract and UDS today- and if OK, will send in refills on tramadol.    6. Educated on serotonin syndrome- I've seen it before- feels flushing, heart rate fast- I've only seen if someone restarts meds at high dose after running out- but keep a watch initially , just in case.  7. F/U in 3 months- on MS and nerve pain  8. Send in phenergan 12.5 mg take 1 q6 hours as needed for nausea due to Cymbalta.    I spent a total of   49  minutes on total care today- >50% coordination of care and education as detailed above. - due to MS and pain control.  Pt's daughter wants to wait on Tramadol. So will cancel Tramadol.

## 2022-02-08 ENCOUNTER — Ambulatory Visit: Payer: Medicare Other | Attending: Family Medicine | Admitting: Physical Therapy

## 2022-02-08 DIAGNOSIS — R2681 Unsteadiness on feet: Secondary | ICD-10-CM | POA: Insufficient documentation

## 2022-02-08 DIAGNOSIS — R2689 Other abnormalities of gait and mobility: Secondary | ICD-10-CM | POA: Diagnosis not present

## 2022-02-08 DIAGNOSIS — M6281 Muscle weakness (generalized): Secondary | ICD-10-CM | POA: Insufficient documentation

## 2022-02-08 NOTE — Therapy (Signed)
OUTPATIENT PHYSICAL THERAPY TREATMENT NOTE  Patient Name: Selena Bender MRN: [id number] DOB:1950/10/21, 71 y.o., female Today's Date: 02/08/2022  PCP: Glenis Smoker, MD REFERRING PROVIDER: Courtney Heys, MD    PT End of Session - 02/08/22 1326     Visit Number 18    Number of Visits 10   Recert   Date for PT Re-Evaluation 90/38/33   Recert   Authorization Type Medicare/[employer] (needs 10th visit PN)    Progress Note Due on Visit 59    PT Start Time 1318    PT Stop Time 1403    PT Time Calculation (min) 45 min    Equipment Utilized During Treatment Other (comment)   Genu recurvatum knee orthosis on LLE   Activity Tolerance Patient tolerated treatment well    Behavior During Therapy [facility] for tasks assessed/performed                   Past Medical History:  Diagnosis Date   Breast cancer ([place]) 2014   Right   CKD (chronic kidney disease), stage III ([place])    COVID-19 virus infection 12/2020   Hypertension    Multiple sclerosis ([place])    Seizures ([place])    Past Surgical History:  Procedure Laterality Date   BREAST SURGERY     MASTECTOMY Right 2014   Patient Active Problem List   Diagnosis Date Noted   CKD (chronic kidney disease), stage III ([place])    COVID-19 virus infection 12/2020   History of right breast cancer 03/13/2020   Status post right mastectomy 03/13/2020   Abnormal gait 09/15/2018   Claustrophobia 09/15/2018   Hypertension 09/15/2018   Localization-related epilepsy ([place]) 09/15/2018   Multiple sclerosis ([place]) 09/15/2018   Paresthesia 09/15/2018   Pain in lower limb 12/31/2015   Seizure ([place]) 12/31/2015   Neuropathic pain 06/02/2015   No advance directives 11/02/2012   Refusal of blood transfusion for reasons of conscience 11/02/2012   Malignant neoplasm of upper-outer quadrant of right breast in female, estrogen receptor positive ([place]) 11/01/2012    REFERRING DIAG: G35 (ICD-10-CM) - Multiple sclerosis ([place])   THERAPY DIAG:   Unsteadiness on feet  Other abnormalities of gait and mobility  Muscle weakness (generalized)  PERTINENT HISTORY: relapsing remitting MS and breast CA and seizures. Pt has MS with LLE wekaness greater than other limbs, with coexisting debility- uses cane to walk has had multiple near falls  PRECAUTIONS: Fall   SUBJECTIVE: Pt reported she saw Dr. Dagoberto Ligas on Friday, changed her medications which she started taking Saturday. Pain is still the same, "it feels like I am burning". Has not been performing exercises due to pain. No new falls   PAIN:  Are you having pain? Yes: NPRS scale: 7/10 Pain location: Entire body Pain description: nerve pain Aggravating factors: none Relieving factors: meds  OBJECTIVE:    TODAY'S TREATMENT:  Self-care/home management  Provided number for pt to call Dr. Randel Pigg office to inquire about MRI of L knee. Discussed appointment w/Dr. Dagoberto Ligas and POC moving forward. Pt decided she would like to continue PT despite pain levels.   Pt reported she has not obtained new shoes for foot-up brace,going to pick up tennis shoes Wednesday.   Ther Act  [facility] PT Assessment - 02/08/22 1338       Transfers   Five time sit to stand comments  31.44s with UE support   Noted significant posterior LOB and staggered stance (LLE fwd)     Ambulation/Gait   Gait velocity  32.8' over 31.59s = 1.03 ft/s w/SBQC and CGA             STAIRS:  Level of Assistance: SBA and CGA Stair Negotiation Technique: Step to Pattern Alternating Pattern  with Bilateral Rails  Number of Stairs: 12   Height of Stairs: 6"  Comments: Significant lateral lean to R to progress LLE. Pt demonstrated intermittent step-to pattern but attempted reciprocal pattern throughout.   Ther Ex SciFit multi-peaks level 3 for 8 minutes using BUE/BLEs for pain modulation, global strengthening, and UE/LE coordination. Pt did not report decrease in pain levels following activity, reported she was "trying not think  about it"   Gait pattern: step to pattern, decreased hip/knee flexion- Left, circumduction- Left, Left foot flat, antalgic, lateral lean- Right, wide BOS, abducted- Left, and poor foot clearance- Left Distance walked: Various clinic distances  Assistive device utilized: Quad cane small base Level of assistance: CGA and Min A Comments: Pt ambulated into clinic w/SBQC and CGA-min A due to poor foot clearance of LLE and posterior lean.     PATIENT EDUCATION: Education details: Goal outcomes, continuing POC despite pain levels  Person educated: Patient  Education method: Explanation  Education comprehension: verbalized understanding     HOME EXERCISE PROGRAM: Access Code: [email or web address] URL: [email or web address] Date: 01/14/2022 Prepared by: Mickie Bail Adyson Vanburen  Exercises - Sit to Stand Without Arm Support  - 1 x daily - 7 x weekly - 1 sets - 10 reps - Supine Bridge  - 1 x daily - 7 x weekly - 1 sets - 10 reps - Standing March with Counter Support  - 1 x daily - 7 x weekly - 1 sets - 10 reps - Standing Hip Abduction with Counter Support  - 1 x daily - 7 x weekly - 1 sets - 10 reps - Standing Hip Extension with Counter Support  - 1 x daily - 7 x weekly - 1 sets - 10 reps - Side Stepping with Counter Support  - 1 x daily - 7 x weekly - 1 sets - 10 reps - Seated Ankle Pumps  - 1 x daily - 7 x weekly - 3 sets - 10 reps - Standing Ankle Dorsiflexion Stretch  - 1 x daily - 7 x weekly - 3 sets - 10 reps - 30 second hold    GOALS: Goals reviewed with patient? Yes    NEW SHORT TERM GOALS FOR EXTENDED POC:   Target date: 02/05/2022  Pt will ascend/descend 12 6" steps w/B rails and step-to pattern using good technique mod I for improved functional mobility  Baseline: Step-to pattern, B rails, poor technique w/descending  Goal status: IN PROGRESS  2.  Pt will improve gait speed to >/=1.5 ft/sec w/ LRAD in order to indicate dec fall risk. Baseline: 0.82 ft/s w/quad cane and SBA; 1.03  ft/s w/SBQC on 7/3 Goal status: IN PROGRESS  3.  5xSTS to be performed and STG/LTG written  Baseline: 31.44s w/UE support  Goal status: MET   NEW LONG TERM GOALS FOR UPDATED POC:  Target date: 03/05/2022  Pt will be independent with final HEP in order to indicate improved functional mobility and dec fall risk. Baseline:  Goal status: INITIAL  2.  Pt will obtain personal AFO and initiate wear schedule at home for improved safety with gait and community mobility  Baseline: Pt to meet with Gerald Stabs at [place] on 6/2 Goal status: INITIAL  3.  Pt will improve 5 x STS to less than or equal to  25 seconds without UE support to demonstrate improved functional strength and transfer efficiency.   Baseline: 31.44s w/UE support and significant posterior LOB, staggered stance (LLE fwd) Goal status: INITIAL  4.  Pt will improve Berg score to 51/56 for decreased fall risk  Baseline: 46/56 Goal status: INITIAL  5. Pt will improve gait velocity to at least 1.5 ft/s w/LRAD mod I for improved gait efficiency   Baseline: 1.03 ft/s w/SBQC and CGA on 7/3  Goal Status: INITIAL       ASSESSMENT:   CLINICAL IMPRESSION: Emphasis of skilled PT session on STG assessment and discussing POC moving forward. Pt has met 1/3 STGs and is progressing the remaining 2. Pt did not meet goals due to pausing PT for past 3 weeks due to high pain levels and being sedentary at home. Despite this, pt did improve her gait speed from baseline w/SBQC. Continue to encourage pt to use rollator at all times for mobility due to increased safety, pt verbalized understanding. Will continue POC as written.     OBJECTIVE IMPAIRMENTS Abnormal gait, decreased activity tolerance, decreased balance, decreased coordination, decreased endurance, decreased knowledge of use of DME, decreased mobility, difficulty walking, decreased ROM, decreased strength, hypomobility, impaired perceived functional ability, impaired flexibility, impaired  sensation, and postural dysfunction.    ACTIVITY LIMITATIONS community activity, meal prep, laundry, and shopping.    PERSONAL FACTORS Past/current experiences, Time since onset of injury/illness/exacerbation, and 1-2 comorbidities: see above  are also affecting patient's functional outcome.      REHAB POTENTIAL: Good   CLINICAL DECISION MAKING: Evolving/moderate complexity   EVALUATION COMPLEXITY: Moderate   PLAN: PT FREQUENCY: 2x/week   PT DURATION: 16 weeks (recert)    PLANNED INTERVENTIONS: Therapeutic exercises, Therapeutic activity, Neuromuscular re-education, Balance training, Gait training, Patient/Family education, Vestibular training, DME instructions, Aquatic Therapy, Electrical stimulation, and Manual therapy   PLAN FOR NEXT SESSION:  Did she call Dr. Marlou Sa? How is pain? How is HEP? sit<>stand practice, heel cord stretch. LLE strengthening.  Gait training w/rollator, AFO and OLD recurvatum brace.  Practice w/turns and navigating obstacles. Eccentric quad strengthening, stair training, fwd/retro lunges, weighted step-ups, TM training on inclines.    Cruzita Lederer Xzander Gilham, PT, DPT 02/08/22, 2:52 PM

## 2022-02-11 ENCOUNTER — Ambulatory Visit: Payer: Medicare Other | Admitting: Physical Therapy

## 2022-02-11 DIAGNOSIS — R2681 Unsteadiness on feet: Secondary | ICD-10-CM | POA: Diagnosis not present

## 2022-02-11 DIAGNOSIS — M6281 Muscle weakness (generalized): Secondary | ICD-10-CM

## 2022-02-11 DIAGNOSIS — R2689 Other abnormalities of gait and mobility: Secondary | ICD-10-CM | POA: Diagnosis not present

## 2022-02-11 NOTE — Therapy (Signed)
OUTPATIENT PHYSICAL THERAPY TREATMENT NOTE  Patient Name: Selena Bender MRN: [id number] DOB:18-Dec-1950, 71 y.o., female Today's Date: 02/11/2022  PCP: Glenis Smoker, MD REFERRING PROVIDER: Courtney Heys, MD    PT End of Session - 02/11/22 1404     Visit Number 19    Number of Visits 47   Recert   Date for PT Re-Evaluation 65/68/12   Recert   Authorization Type Medicare/[employer] (needs 10th visit PN)    Progress Note Due on Visit 68    PT Start Time 1402    PT Stop Time 7517    PT Time Calculation (min) 45 min    Equipment Utilized During Treatment Other (comment)   Genu recurvatum knee orthosis on LLE and L foot up brace   Activity Tolerance Patient tolerated treatment well    Behavior During Therapy [facility] for tasks assessed/performed                    Past Medical History:  Diagnosis Date   Breast cancer ([place]) 2014   Right   CKD (chronic kidney disease), stage III ([place])    COVID-19 virus infection 12/2020   Hypertension    Multiple sclerosis ([place])    Seizures ([place])    Past Surgical History:  Procedure Laterality Date   BREAST SURGERY     MASTECTOMY Right 2014   Patient Active Problem List   Diagnosis Date Noted   CKD (chronic kidney disease), stage III ([place])    COVID-19 virus infection 12/2020   History of right breast cancer 03/13/2020   Status post right mastectomy 03/13/2020   Abnormal gait 09/15/2018   Claustrophobia 09/15/2018   Hypertension 09/15/2018   Localization-related epilepsy ([place]) 09/15/2018   Multiple sclerosis ([place]) 09/15/2018   Paresthesia 09/15/2018   Pain in lower limb 12/31/2015   Seizure ([place]) 12/31/2015   Neuropathic pain 06/02/2015   No advance directives 11/02/2012   Refusal of blood transfusion for reasons of conscience 11/02/2012   Malignant neoplasm of upper-outer quadrant of right breast in female, estrogen receptor positive ([place]) 11/01/2012    REFERRING DIAG: G35 (ICD-10-CM) - Multiple sclerosis ([place])    THERAPY DIAG:  Unsteadiness on feet  Other abnormalities of gait and mobility  Muscle weakness (generalized)  PERTINENT HISTORY: relapsing remitting MS and breast CA and seizures. Pt has MS with LLE wekaness greater than other limbs, with coexisting debility- uses cane to walk has had multiple near falls  PRECAUTIONS: Fall   SUBJECTIVE: Pt reports her pain is better today. Walked in with her new tennis shoes and almost fell 3x due to L foot catching on ground, thinks the shoes are too large for her. No other changes   PAIN:  Are you having pain? Yes: NPRS scale: 7/10 Pain location: Entire body Pain description: nerve pain Aggravating factors: none Relieving factors: meds  OBJECTIVE:    TODAY'S TREATMENT:  Gait Training/Therapeutic Activity  Pt ambulated into clinic w/new tennis shoes on and rollator, had new foot-up brace in rollator. Pt required min-mod A to walk into clinic due to occasional retropulsion and catching of L foot, causing pt to pitch forward.  At [facility], therapist assessed size of pt's tennis shoes and determine they fit well. Laced foot-up brace onto tongue of shoe and had significant difficulty attaching brace on pt due to limited DF. Pt then ambulated >150 inside clinic w/L foot-up brace, L genu recurvatum brace and rollator w/CGA. Noted increased step clearance of LLE w/brace, but continued ER of LLE  w/circumduction.   Once back on mat, allowed pt to attempt to don/doff foot-up brace herself, as the goal of bracing was to improve safety and independence as pt does not have 24/7 supervision. Pt unable to don brace herself due to diminished functional use of BUEs. Educated pt on multiple techniques to don brace, but pt unable to perform and verbalized frustration. Therapist assisted pt in donning ankle brace and had pt attempt to fasten ankle and toe pieces, but pt unable to do so.   Pt very frustrated with inability to don/doff brace by herself, stating she will only  be able to use it if someone is with her or if she wears it all day long. Educated pt on starting a wear schedule with brace (starting with 2-4 hours of wear time per day) and building up to assess for skin irritation at the ankle and continued practice donning/doffing brace.      PATIENT EDUCATION: Education details: See above Person educated: Patient  Education method: Explanation  Education comprehension: verbalized understanding     HOME EXERCISE PROGRAM: Access Code: [email or web address] URL: [email or web address] Date: 01/14/2022 Prepared by: Mickie Bail Rejeana Fadness  Exercises - Sit to Stand Without Arm Support  - 1 x daily - 7 x weekly - 1 sets - 10 reps - Supine Bridge  - 1 x daily - 7 x weekly - 1 sets - 10 reps - Standing March with Counter Support  - 1 x daily - 7 x weekly - 1 sets - 10 reps - Standing Hip Abduction with Counter Support  - 1 x daily - 7 x weekly - 1 sets - 10 reps - Standing Hip Extension with Counter Support  - 1 x daily - 7 x weekly - 1 sets - 10 reps - Side Stepping with Counter Support  - 1 x daily - 7 x weekly - 1 sets - 10 reps - Seated Ankle Pumps  - 1 x daily - 7 x weekly - 3 sets - 10 reps - Standing Ankle Dorsiflexion Stretch  - 1 x daily - 7 x weekly - 3 sets - 10 reps - 30 second hold    GOALS: Goals reviewed with patient? Yes    NEW SHORT TERM GOALS FOR EXTENDED POC:   Target date: 02/05/2022  Pt will ascend/descend 12 6" steps w/B rails and step-to pattern using good technique mod I for improved functional mobility  Baseline: Step-to pattern, B rails, poor technique w/descending  Goal status: IN PROGRESS  2.  Pt will improve gait speed to >/=1.5 ft/sec w/ LRAD in order to indicate dec fall risk. Baseline: 0.82 ft/s w/quad cane and SBA; 1.03 ft/s w/SBQC on 7/3 Goal status: IN PROGRESS  3.  5xSTS to be performed and STG/LTG written  Baseline: 31.44s w/UE support  Goal status: MET   NEW LONG TERM GOALS FOR UPDATED POC:  Target date:  03/05/2022  Pt will be independent with final HEP in order to indicate improved functional mobility and dec fall risk. Baseline:  Goal status: INITIAL  2.  Pt will obtain personal AFO and initiate wear schedule at home for improved safety with gait and community mobility  Baseline: Pt to meet with Gerald Stabs at [place] on 6/2 Goal status: INITIAL  3.  Pt will improve 5 x STS to less than or equal to 25 seconds without UE support to demonstrate improved functional strength and transfer efficiency.   Baseline: 31.44s w/UE support and significant posterior LOB, staggered stance (LLE fwd) Goal status: INITIAL  4.  Pt will improve Berg score to 51/56 for decreased fall risk  Baseline: 46/56 Goal status: INITIAL  5. Pt will improve gait velocity to at least 1.5 ft/s w/LRAD mod I for improved gait efficiency   Baseline: 1.03 ft/s w/SBQC and CGA on 7/3  Goal Status: INITIAL       ASSESSMENT:   CLINICAL IMPRESSION: Emphasis of skilled PT session on gait training w/foot-up brace and management of brace at home. Pt unable to don brace by herself and is frustrated in lack of independence w/brace, as she was able to don an AFO. Encouraged pt to begin a wear schedule at home to build up tolerance for brace and check for skin breakdown. Continue POC.      OBJECTIVE IMPAIRMENTS Abnormal gait, decreased activity tolerance, decreased balance, decreased coordination, decreased endurance, decreased knowledge of use of DME, decreased mobility, difficulty walking, decreased ROM, decreased strength, hypomobility, impaired perceived functional ability, impaired flexibility, impaired sensation, and postural dysfunction.    ACTIVITY LIMITATIONS community activity, meal prep, laundry, and shopping.    PERSONAL FACTORS Past/current experiences, Time since onset of injury/illness/exacerbation, and 1-2 comorbidities: see above  are also affecting patient's functional outcome.      REHAB POTENTIAL: Good    CLINICAL DECISION MAKING: Evolving/moderate complexity   EVALUATION COMPLEXITY: Moderate   PLAN: PT FREQUENCY: 2x/week   PT DURATION: 16 weeks (recert)    PLANNED INTERVENTIONS: Therapeutic exercises, Therapeutic activity, Neuromuscular re-education, Balance training, Gait training, Patient/Family education, Vestibular training, DME instructions, Aquatic Therapy, Electrical stimulation, and Manual therapy   PLAN FOR NEXT SESSION:  Did she call Dr. Marlou Sa? How is pain? How is HEP? sit<>stand practice, heel cord stretch. LLE strengthening.  Gait training w/rollator, foot-up brace and OLD recurvatum brace.  Practice w/turns and navigating obstacles. Eccentric quad strengthening, stair training, fwd/retro lunges, weighted step-ups, TM training on inclines.    Cruzita Lederer Amine Adelson, PT, DPT 02/11/22, 3:25 PM

## 2022-02-16 ENCOUNTER — Ambulatory Visit: Payer: Medicare Other | Admitting: Rehabilitation

## 2022-02-18 ENCOUNTER — Ambulatory Visit: Payer: Medicare Other | Admitting: Physical Therapy

## 2022-02-23 ENCOUNTER — Ambulatory Visit: Payer: Medicare Other | Admitting: Rehabilitation

## 2022-02-25 ENCOUNTER — Ambulatory Visit: Payer: Medicare Other | Admitting: Physical Therapy

## 2022-02-25 DIAGNOSIS — R2689 Other abnormalities of gait and mobility: Secondary | ICD-10-CM | POA: Diagnosis not present

## 2022-02-25 DIAGNOSIS — R2681 Unsteadiness on feet: Secondary | ICD-10-CM | POA: Diagnosis not present

## 2022-02-25 DIAGNOSIS — M6281 Muscle weakness (generalized): Secondary | ICD-10-CM

## 2022-02-25 NOTE — Therapy (Signed)
OUTPATIENT PHYSICAL THERAPY TREATMENT NOTE- 20TH VISIT PROGRESS NOTE  Patient Name: Selena Bender MRN: [id number] DOB:18-Nov-1950, 71 y.o., female Today's Date: 02/25/2022  PCP: Glenis Smoker, MD REFERRING PROVIDER: Courtney Heys, MD   Physical Therapy Progress Note   Dates of Reporting Period:11/05/21 - 02/25/22  See Note below for Objective Data and Assessment of Progress/Goals.  Thank you for the referral of this patient. Mickie Bail Manuella Blackson, PT, DPT    PT End of Session - 02/25/22 1325     Visit Number 20    Number of Visits 63   Recert   Date for PT Re-Evaluation 05/01/29   Recert   Authorization Type Medicare/[employer] (needs 10th visit PN)    Progress Note Due on Visit 20    PT Start Time 1323   Pt arrived late   PT Stop Time 1400    PT Time Calculation (min) 37 min    Equipment Utilized During Treatment Other (comment)   Genu recurvatum knee orthosis on LLE and L foot up brace   Activity Tolerance Patient tolerated treatment well    Behavior During Therapy [facility] for tasks assessed/performed                    Past Medical History:  Diagnosis Date   Breast cancer ([place]) 2014   Right   CKD (chronic kidney disease), stage III ([place])    COVID-19 virus infection 12/2020   Hypertension    Multiple sclerosis ([place])    Seizures ([place])    Past Surgical History:  Procedure Laterality Date   BREAST SURGERY     MASTECTOMY Right 2014   Patient Active Problem List   Diagnosis Date Noted   CKD (chronic kidney disease), stage III ([place])    COVID-19 virus infection 12/2020   History of right breast cancer 03/13/2020   Status post right mastectomy 03/13/2020   Abnormal gait 09/15/2018   Claustrophobia 09/15/2018   Hypertension 09/15/2018   Localization-related epilepsy ([place]) 09/15/2018   Multiple sclerosis ([place]) 09/15/2018   Paresthesia 09/15/2018   Pain in lower limb 12/31/2015   Seizure ([place]) 12/31/2015   Neuropathic pain 06/02/2015   No advance  directives 11/02/2012   Refusal of blood transfusion for reasons of conscience 11/02/2012   Malignant neoplasm of upper-outer quadrant of right breast in female, estrogen receptor positive ([place]) 11/01/2012    REFERRING DIAG: G35 (ICD-10-CM) - Multiple sclerosis (HCC)   THERAPY DIAG:  Unsteadiness on feet  Other abnormalities of gait and mobility  Muscle weakness (generalized)  PERTINENT HISTORY: relapsing remitting MS and breast CA and seizures. Pt has MS with LLE wekaness greater than other limbs, with coexisting debility- uses cane to walk has had multiple near falls  PRECAUTIONS: Fall   SUBJECTIVE: Pt reports she thinks the new medicine is helping a little bit with pain. No new changes. Was unable to attend therapy last week due to not having transportation.   PAIN:  Are you having pain? Yes: NPRS scale: 7/10 Pain location: Entire body Pain description: nerve pain Aggravating factors: none Relieving factors: meds  OBJECTIVE:    TODAY'S TREATMENT:  Self-care/home management  Briefly discussed updates on scheduling MRI and using foot-up brace at home. Pt has not called Dr. Randel Pigg office to schedule MRI, states she will do so tomorrow. Pt also has not used foot-up brace at home yet, states her daughter was out of town last week and she stayed with her cousin and did not feel like trying it.  Ther Ex  SciFit multi-peaks level 3 for 8 minutes using BUE/BLEs for dynamic cardiovascular conditioning, UE/LE coordination and global strengthening. Noted ER of LLE w/genu valgus throughout. RPE of 4/10 and pain 1/10 following activity.   Supine glute bridges w/isomatric hip adduction (squeezing ball) for improved posterior chain and hip strength. Pt performed x15 reps w/3s isometric hold, min cues for slow eccentric lowering as pt tends to flop down. Therapist sat on pt's feet and provided stabilization to LLE throughout to prevent movement of LLE, as pt unable to stabilize on her own.     PATIENT EDUCATION: Education details: See self-care section  Person educated: Patient  Education method: Explanation  Education comprehension: verbalized understanding     HOME EXERCISE PROGRAM: Access Code: [email or web address] URL: [email or web address] Date: 01/14/2022 Prepared by: Mickie Bail Plaster  Exercises - Sit to Stand Without Arm Support  - 1 x daily - 7 x weekly - 1 sets - 10 reps - Supine Bridge  - 1 x daily - 7 x weekly - 1 sets - 10 reps - Standing March with Counter Support  - 1 x daily - 7 x weekly - 1 sets - 10 reps - Standing Hip Abduction with Counter Support  - 1 x daily - 7 x weekly - 1 sets - 10 reps - Standing Hip Extension with Counter Support  - 1 x daily - 7 x weekly - 1 sets - 10 reps - Side Stepping with Counter Support  - 1 x daily - 7 x weekly - 1 sets - 10 reps - Seated Ankle Pumps  - 1 x daily - 7 x weekly - 3 sets - 10 reps - Standing Ankle Dorsiflexion Stretch  - 1 x daily - 7 x weekly - 3 sets - 10 reps - 30 second hold    GOALS: Goals reviewed with patient? Yes    NEW SHORT TERM GOALS FOR EXTENDED POC:   Target date: 02/05/2022  Pt will ascend/descend 12 6" steps w/B rails and step-to pattern using good technique mod I for improved functional mobility  Baseline: Step-to pattern, B rails, poor technique w/descending  Goal status: IN PROGRESS  2.  Pt will improve gait speed to >/=1.5 ft/sec w/ LRAD in order to indicate dec fall risk. Baseline: 0.82 ft/s w/quad cane and SBA; 1.03 ft/s w/SBQC on 7/3 Goal status: IN PROGRESS  3.  5xSTS to be performed and STG/LTG written  Baseline: 31.44s w/UE support  Goal status: MET   NEW LONG TERM GOALS FOR UPDATED POC:  Target date: 03/05/2022  Pt will be independent with final HEP in order to indicate improved functional mobility and dec fall risk. Baseline:  Goal status: INITIAL  2.  Pt will obtain personal AFO and initiate wear schedule at home for improved safety with gait and community  mobility  Baseline: Pt to meet with Gerald Stabs at [place] on 6/2 Goal status: INITIAL  3.  Pt will improve 5 x STS to less than or equal to 25 seconds without UE support to demonstrate improved functional strength and transfer efficiency.   Baseline: 31.44s w/UE support and significant posterior LOB, staggered stance (LLE fwd) Goal status: INITIAL  4.  Pt will improve Berg score to 51/56 for decreased fall risk  Baseline: 46/56 Goal status: INITIAL  5. Pt will improve gait velocity to at least 1.5 ft/s w/LRAD mod I for improved gait efficiency   Baseline: 1.03 ft/s w/SBQC and CGA on 7/3  Goal Status: INITIAL  ASSESSMENT:   CLINICAL IMPRESSION: Session limited due to pt arriving late. Emphasis of skilled PT session on BLE strength and endurance. Pt tolerated session well w/reduction in pain following SciFit. Pt has not trialed foot-up brace at home and could benefit from continued practice in therapy. Pt continues to be limited by LLE weakness and pain but stated her pain is better with the new medication. Continue POC.      OBJECTIVE IMPAIRMENTS Abnormal gait, decreased activity tolerance, decreased balance, decreased coordination, decreased endurance, decreased knowledge of use of DME, decreased mobility, difficulty walking, decreased ROM, decreased strength, hypomobility, impaired perceived functional ability, impaired flexibility, impaired sensation, and postural dysfunction.    ACTIVITY LIMITATIONS community activity, meal prep, laundry, and shopping.    PERSONAL FACTORS Past/current experiences, Time since onset of injury/illness/exacerbation, and 1-2 comorbidities: see above  are also affecting patient's functional outcome.      REHAB POTENTIAL: Good   CLINICAL DECISION MAKING: Evolving/moderate complexity   EVALUATION COMPLEXITY: Moderate   PLAN: PT FREQUENCY: 2x/week   PT DURATION: 16 weeks (recert)    PLANNED INTERVENTIONS: Therapeutic exercises, Therapeutic  activity, Neuromuscular re-education, Balance training, Gait training, Patient/Family education, Vestibular training, DME instructions, Aquatic Therapy, Electrical stimulation, and Manual therapy   PLAN FOR NEXT SESSION:  Did she call Dr. Marlou Sa? How is pain? How is HEP? sit<>stand practice, heel cord stretch. LLE strengthening.  Gait training w/rollator, foot-up brace and OLD recurvatum brace.  Practice w/turns and navigating obstacles. Eccentric quad strengthening, stair training, fwd/retro lunges, weighted step-ups, TM training on inclines.    Cruzita Lederer Plaster, PT, DPT 02/25/22, 2:03 PM

## 2022-02-28 ENCOUNTER — Other Ambulatory Visit: Payer: Self-pay | Admitting: Physical Medicine and Rehabilitation

## 2022-03-02 ENCOUNTER — Ambulatory Visit: Payer: Medicare Other | Admitting: Physical Therapy

## 2022-03-02 DIAGNOSIS — M6281 Muscle weakness (generalized): Secondary | ICD-10-CM

## 2022-03-02 DIAGNOSIS — R2689 Other abnormalities of gait and mobility: Secondary | ICD-10-CM | POA: Diagnosis not present

## 2022-03-02 DIAGNOSIS — R2681 Unsteadiness on feet: Secondary | ICD-10-CM

## 2022-03-02 NOTE — Therapy (Signed)
OUTPATIENT PHYSICAL THERAPY TREATMENT NOTE Patient Name: Selena Bender MRN: [id number] DOB:1951/05/13, 71 y.o., female Today's Date: 03/02/2022  PCP: Glenis Smoker, MD REFERRING PROVIDER: Courtney Heys, MD       PT End of Session - 03/02/22 1323     Visit Number 21    Number of Visits 33   Recert   Date for PT Re-Evaluation 40/98/11   Recert   Authorization Type Medicare/[employer] (needs 10th visit PN)    Progress Note Due on Visit 20    PT Start Time 1320    PT Stop Time 1401    PT Time Calculation (min) 41 min    Equipment Utilized During Treatment Other (comment)   Genu recurvatum knee orthosis on LLE and L foot up brace   Activity Tolerance Patient tolerated treatment well    Behavior During Therapy [place] for tasks assessed/performed                     Past Medical History:  Diagnosis Date   Breast cancer ([place]) 2014   Right   CKD (chronic kidney disease), stage III ([place])    COVID-19 virus infection 12/2020   Hypertension    Multiple sclerosis ([place])    Seizures ([place])    Past Surgical History:  Procedure Laterality Date   BREAST SURGERY     MASTECTOMY Right 2014   Patient Active Problem List   Diagnosis Date Noted   CKD (chronic kidney disease), stage III ([place])    COVID-19 virus infection 12/2020   History of right breast cancer 03/13/2020   Status post right mastectomy 03/13/2020   Abnormal gait 09/15/2018   Claustrophobia 09/15/2018   Hypertension 09/15/2018   Localization-related epilepsy ([place]) 09/15/2018   Multiple sclerosis ([place]) 09/15/2018   Paresthesia 09/15/2018   Pain in lower limb 12/31/2015   Seizure ([place]) 12/31/2015   Neuropathic pain 06/02/2015   No advance directives 11/02/2012   Refusal of blood transfusion for reasons of conscience 11/02/2012   Malignant neoplasm of upper-outer quadrant of right breast in female, estrogen receptor positive ([place]) 11/01/2012    REFERRING DIAG: G35 (ICD-10-CM) - Multiple sclerosis  ([place])   THERAPY DIAG:  Unsteadiness on feet  Other abnormalities of gait and mobility  Muscle weakness (generalized)  PERTINENT HISTORY: relapsing remitting MS and breast CA and seizures. Pt has MS with LLE wekaness greater than other limbs, with coexisting debility- uses cane to walk has had multiple near falls  PRECAUTIONS: Fall   SUBJECTIVE: Pt reports she called Dr. Randel Pigg office to schedule MRI but was redirected and never spoke to anyone to schedule. Will re-attempt today. No new changes, reports HEP is going well.   PAIN:  Are you having pain? Yes: NPRS scale: 7/10 Pain location: Entire body Pain description: nerve pain Aggravating factors: none Relieving factors: meds  OBJECTIVE:    TODAY'S TREATMENT:  Self-care/home management  Discussed POC moving forward, as pt's last scheduled visit is next session. At this time, pt would like to add more visits to work on global strengthening and gait. Encouraged pt to schedule L knee MRI as soon as possible in order to determine POC for next bout of therapy. Pt verbalized understanding.    Ther Act  LTG assessment   [facility] PT Assessment - 03/02/22 1330       Berg Balance Test   Sit to Stand Able to stand without using hands and stabilize independently    Standing Unsupported Able to stand safely 2 minutes  Sitting with Back Unsupported but Feet Supported on Floor or Stool Able to sit safely and securely 2 minutes    Stand to Sit Sits safely with minimal use of hands    Transfers Able to transfer safely, minor use of hands    Standing Unsupported with Eyes Closed Able to stand 10 seconds safely    Standing Unsupported with Feet Together Able to place feet together independently and stand 1 minute safely    From Standing, Reach Forward with Outstretched Arm Can reach confidently >25 cm (10")    From Standing Position, Pick up Object from Floor Able to pick up shoe safely and easily    From Standing Position, Turn to Look Behind  Over each Shoulder Looks behind from both sides and weight shifts well    Turn 360 Degrees Able to turn 360 degrees safely but slowly   7.41s to R   Standing Unsupported, Alternately Place Feet on Step/Stool Able to complete >2 steps/needs minimal assist   3 steps w/HHA   Standing Unsupported, One Foot in Front Able to take small step independently and hold 30 seconds    Standing on One Leg Tries to lift leg/unable to hold 3 seconds but remains standing independently    Total Score 46    Berg comment: Moderate fall risk             Gait Training  Gait pattern: step through pattern, decreased step length- Right, decreased stance time- Left, decreased stride length, decreased hip/knee flexion- Left, decreased ankle dorsiflexion- Left, circumduction- Left, Left foot flat, genu recurvatum- Left, lateral lean- Right, wide BOS, and poor foot clearance- Left Distance walked: >250' indoors around clinic Assistive device utilized: Walker - 4 wheeled and L foot-up brace and L genu recurvatum brace Level of assistance: SBA Comments: Min cues for reduction of ER of LLE and to flex knee through swing phase ("pretend you are marching your knee forward"). Pt able to minimally correct LLE position if not distracted, but has impaired selective attention      PATIENT EDUCATION: Education details: See self-care section, Berg outcome assessment   Person educated: Patient  Education method: Explanation  Education comprehension: verbalized understanding     HOME EXERCISE PROGRAM: Access Code: [email or web address] URL: [email or web address] Date: 01/14/2022 Prepared by: Mickie Bail Oniel Meleski  Exercises - Sit to Stand Without Arm Support  - 1 x daily - 7 x weekly - 1 sets - 10 reps - Supine Bridge  - 1 x daily - 7 x weekly - 1 sets - 10 reps - Standing March with Counter Support  - 1 x daily - 7 x weekly - 1 sets - 10 reps - Standing Hip Abduction with Counter Support  - 1 x daily - 7 x weekly - 1 sets -  10 reps - Standing Hip Extension with Counter Support  - 1 x daily - 7 x weekly - 1 sets - 10 reps - Side Stepping with Counter Support  - 1 x daily - 7 x weekly - 1 sets - 10 reps - Seated Ankle Pumps  - 1 x daily - 7 x weekly - 3 sets - 10 reps - Standing Ankle Dorsiflexion Stretch  - 1 x daily - 7 x weekly - 3 sets - 10 reps - 30 second hold    GOALS: Goals reviewed with patient? Yes    NEW SHORT TERM GOALS FOR EXTENDED POC:   Target date: 02/05/2022  Pt will ascend/descend 12 6" steps w/B rails and  step-to pattern using good technique mod I for improved functional mobility  Baseline: Step-to pattern, B rails, poor technique w/descending  Goal status: IN PROGRESS  2.  Pt will improve gait speed to >/=1.5 ft/sec w/ LRAD in order to indicate dec fall risk. Baseline: 0.82 ft/s w/quad cane and SBA; 1.03 ft/s w/SBQC on 7/3 Goal status: IN PROGRESS  3.  5xSTS to be performed and STG/LTG written  Baseline: 31.44s w/UE support  Goal status: MET   NEW LONG TERM GOALS FOR UPDATED POC:  Target date: 03/05/2022  Pt will be independent with final HEP in order to indicate improved functional mobility and dec fall risk. Baseline:  Goal status: INITIAL  2.  Pt will obtain personal AFO and initiate wear schedule at home for improved safety with gait and community mobility  Baseline: Pt to meet with Gerald Stabs at [place] on 6/2 Goal status: INITIAL  3.  Pt will improve 5 x STS to less than or equal to 25 seconds without UE support to demonstrate improved functional strength and transfer efficiency.   Baseline: 31.44s w/UE support and significant posterior LOB, staggered stance (LLE fwd) Goal status: INITIAL  4.  Pt will improve Berg score to 51/56 for decreased fall risk  Baseline: 46/56; 46/56 on 7/25  Goal status: NOT MET  5. Pt will improve gait velocity to at least 1.5 ft/s w/LRAD mod I for improved gait efficiency   Baseline: 1.03 ft/s w/SBQC and CGA on 7/3  Goal Status: INITIAL        ASSESSMENT:   CLINICAL IMPRESSION: Emphasis of skilled PT session on LTG assessment and gait training. Pt did not meet LTG #4, achieving the same score on Berg that she received on previous goal assessment >30 days ago. Pt has reached max potential w/Berg due to impairments 2/2 MS, pt verbalized understanding. Pt would like to add more visits to POC, plan to recert next session to focus on gait training and global strengthening. Continue POC.      OBJECTIVE IMPAIRMENTS Abnormal gait, decreased activity tolerance, decreased balance, decreased coordination, decreased endurance, decreased knowledge of use of DME, decreased mobility, difficulty walking, decreased ROM, decreased strength, hypomobility, impaired perceived functional ability, impaired flexibility, impaired sensation, and postural dysfunction.    ACTIVITY LIMITATIONS community activity, meal prep, laundry, and shopping.    PERSONAL FACTORS Past/current experiences, Time since onset of injury/illness/exacerbation, and 1-2 comorbidities: see above  are also affecting patient's functional outcome.      REHAB POTENTIAL: Good   CLINICAL DECISION MAKING: Evolving/moderate complexity   EVALUATION COMPLEXITY: Moderate   PLAN: PT FREQUENCY: 2x/week   PT DURATION: 16 weeks (recert)    PLANNED INTERVENTIONS: Therapeutic exercises, Therapeutic activity, Neuromuscular re-education, Balance training, Gait training, Patient/Family education, Vestibular training, DME instructions, Aquatic Therapy, Electrical stimulation, and Manual therapy   PLAN FOR NEXT SESSION:  Recert and add goals. Aquatics? Did she call Dr. Marlou Sa? How is pain? How is HEP? sit<>stand practice, heel cord stretch. LLE strengthening.  Gait training w/rollator, foot-up brace and OLD recurvatum brace.  Practice w/turns and navigating obstacles. Eccentric quad strengthening, stair training, fwd/retro lunges, weighted step-ups, TM training on inclines.    Cruzita Lederer  Julieta Rogalski, PT, DPT 03/02/22, 2:11 PM

## 2022-03-03 ENCOUNTER — Telehealth: Payer: Self-pay | Admitting: *Deleted

## 2022-03-03 NOTE — Telephone Encounter (Signed)
Pt called left vm in reference to her MRI- I called back lvm to return my call.

## 2022-03-04 ENCOUNTER — Ambulatory Visit: Payer: Medicare Other | Admitting: Physical Therapy

## 2022-03-04 ENCOUNTER — Telehealth: Payer: Self-pay | Admitting: Physical Therapy

## 2022-03-04 DIAGNOSIS — M6281 Muscle weakness (generalized): Secondary | ICD-10-CM | POA: Diagnosis not present

## 2022-03-04 DIAGNOSIS — R2681 Unsteadiness on feet: Secondary | ICD-10-CM

## 2022-03-04 DIAGNOSIS — R2689 Other abnormalities of gait and mobility: Secondary | ICD-10-CM | POA: Diagnosis not present

## 2022-03-04 NOTE — Telephone Encounter (Signed)
Selena Bender,   I am currently with [name] for physical therapy and her daughter is present and would like me to share her phone number to schedule the MRI for [name]'s left knee. The daughter's number is [phone].   Thank you,  [name], PT, [place] [address] [place],   [address] Phone:  [phone] Fax:  [phone]

## 2022-03-04 NOTE — Therapy (Signed)
OUTPATIENT PHYSICAL THERAPY TREATMENT NOTE- RECERTIFICATION   Patient Name: Selena Bender MRN: [id number] DOB:26-Oct-1950, 71 y.o., female Today's Date: 03/04/2022  PCP: Glenis Smoker, MD REFERRING PROVIDER: Courtney Heys, MD       PT End of Session - 03/04/22 1324     Visit Number 22    Number of Visits 32   Recert   Date for PT Re-Evaluation 91/66/06   Recert   Authorization Type Medicare/[employer] (needs 10th visit PN)    Progress Note Due on Visit 33    PT Start Time 1320    PT Stop Time 1400    PT Time Calculation (min) 40 min    Equipment Utilized During Treatment Other (comment)   Genu recurvatum knee orthosis on LLE and L foot up brace   Activity Tolerance Patient tolerated treatment well    Behavior During Therapy [facility] for tasks assessed/performed                     Past Medical History:  Diagnosis Date   Breast cancer ([place]) 2014   Right   CKD (chronic kidney disease), stage III ([place])    COVID-19 virus infection 12/2020   Hypertension    Multiple sclerosis ([place])    Seizures ([place])    Past Surgical History:  Procedure Laterality Date   BREAST SURGERY     MASTECTOMY Right 2014   Patient Active Problem List   Diagnosis Date Noted   CKD (chronic kidney disease), stage III ([place])    COVID-19 virus infection 12/2020   History of right breast cancer 03/13/2020   Status post right mastectomy 03/13/2020   Abnormal gait 09/15/2018   Claustrophobia 09/15/2018   Hypertension 09/15/2018   Localization-related epilepsy ([place]) 09/15/2018   Multiple sclerosis ([place]) 09/15/2018   Paresthesia 09/15/2018   Pain in lower limb 12/31/2015   Seizure ([place]) 12/31/2015   Neuropathic pain 06/02/2015   No advance directives 11/02/2012   Refusal of blood transfusion for reasons of conscience 11/02/2012   Malignant neoplasm of upper-outer quadrant of right breast in female, estrogen receptor positive ([place]) 11/01/2012    REFERRING DIAG: G35 (ICD-10-CM) -  Multiple sclerosis ([place])   THERAPY DIAG:  Unsteadiness on feet  Other abnormalities of gait and mobility  Muscle weakness (generalized)  PERTINENT HISTORY: relapsing remitting MS and breast CA and seizures. Pt has MS with LLE wekaness greater than other limbs, with coexisting debility- uses cane to walk has had multiple near falls  PRECAUTIONS: Fall   SUBJECTIVE: Pt reports she has been playing phone tag w/ortho office regarding MRI. Having more of an off day today, did not sleep well last night.   PAIN:  Are you having pain? Yes: NPRS scale: 7/10 Pain location: Entire body Pain description: nerve pain Aggravating factors: none Relieving factors: meds  OBJECTIVE:    TODAY'S TREATMENT:  Ther Act/Self-care/home management  Discussed recertification w/pt and daughter. Daughter had several questions regarding scheduling MRI, obtaining AFO and goals for next bout of PT, all of which were addressed. Per daughter's request, sent telephone call to orthopedic office regarding pt's MRI to provide daughter's phone number, as pt unable to answer phone calls well and frequently forgets her phone. At this time, pt and daughter in agreement to recert through August and prioritize getting MRI scheduled and correct brace for LLE.    LTG assessment   [facility] PT Assessment - 03/04/22 1349       Ambulation/Gait   Gait velocity 32.8' over 26.28s =  1.24 ft/s with rollator at S* level      Standardized Balance Assessment   Standardized Balance Assessment Five Times Sit to Stand    Five times sit to stand comments  22.25 sec with no UE support but braces with LE against mat table              Gait Training  Gait pattern: step through pattern, decreased step length- Right, decreased stance time- Left, decreased stride length, decreased hip/knee flexion- Left, decreased ankle dorsiflexion- Left, circumduction- Left, Left foot flat, genu recurvatum- Left, lateral lean- Right, wide BOS, and poor foot  clearance- Left Distance walked: around clinic Assistive device utilized: Walker - 4 wheeled and L foot-up brace and L genu recurvatum brace Level of assistance: SBA Comments: Min cues for reduction of ER of LLE and to flex knee through swing phase ("pretend you are marching your knee forward"). Pt able to minimally correct LLE position if not distracted, but has impaired selective attention      PATIENT EDUCATION: Education details: See self-care section Person educated: Patient  Education method: Explanation  Education comprehension: verbalized understanding     HOME EXERCISE PROGRAM: Access Code: [email or web address] URL: [email or web address] Date: 01/14/2022 Prepared by: Mickie Bail Shrihaan Porzio  Exercises - Sit to Stand Without Arm Support  - 1 x daily - 7 x weekly - 1 sets - 10 reps - Supine Bridge  - 1 x daily - 7 x weekly - 1 sets - 10 reps - Standing March with Counter Support  - 1 x daily - 7 x weekly - 1 sets - 10 reps - Standing Hip Abduction with Counter Support  - 1 x daily - 7 x weekly - 1 sets - 10 reps - Standing Hip Extension with Counter Support  - 1 x daily - 7 x weekly - 1 sets - 10 reps - Side Stepping with Counter Support  - 1 x daily - 7 x weekly - 1 sets - 10 reps - Seated Ankle Pumps  - 1 x daily - 7 x weekly - 3 sets - 10 reps - Standing Ankle Dorsiflexion Stretch  - 1 x daily - 7 x weekly - 3 sets - 10 reps - 30 second hold    GOALS: Goals reviewed with patient? Yes   OLD LONG TERM GOALS:  Target date: 03/05/2022  Pt will be independent with final HEP in order to indicate improved functional mobility and dec fall risk. Baseline:  Goal status: IN PROGRESS  2.  Pt will obtain personal AFO and initiate wear schedule at home for improved safety with gait and community mobility  Baseline: Pt to meet with Gerald Stabs at [place] on 6/2 Goal status: NOT MET  3.  Pt will improve 5 x STS to less than or equal to 25 seconds without UE support to demonstrate improved  functional strength and transfer efficiency.   Baseline: 31.44s w/UE support and significant posterior LOB, staggered stance (LLE fwd); 22.25s without UE support but heavy bracing against mat  Goal status: IN PROGRESS  4.  Pt will improve Berg score to 51/56 for decreased fall risk  Baseline: 46/56; 46/56 on 7/25  Goal status: NOT MET  5. Pt will improve gait velocity to at least 1.5 ft/s w/LRAD mod I for improved gait efficiency  Baseline: 1.03 ft/s w/SBQC and CGA on 7/3; 1.24 ft/s w/rollator, L foot-up brace and L genu recurvatum brace   Goal Status: PROGRESSING    NEW LONG TERM GOALS FOR EXTENDED POC:  Target date: 04/08/2022  Pt will be independent with final HEP in order to indicate improved functional mobility and dec fall risk. Baseline:  Goal status: INITIAL  2.  Pt will improve 5 x STS to less than or equal to 25 seconds without UE support and no bracing against mat to demonstrate improved functional strength and transfer efficiency.  Baseline: 22.25s without UE support but heavy bracing against mat Goal status: INITIAL  3.  Pt will obtain personal AFO and initiate wear schedule at home for improved safety with gait and community mobility  Baseline: Pt obtained foot-up brace from [facility], not AFO Goal status: INITIAL  4.  Pt will schedule MRI of L knee to rule out potential orthopedic injury  Baseline:  Goal status: INITIAL  5.  Pt will ambulate >500' w/LRAD and L AFO for improved independence and functional mobility  Baseline:  Goal status: INITIAL      ASSESSMENT:   CLINICAL IMPRESSION: Emphasis of skilled PT session on LTG assessment. Pt did not meet 2/5 LTGs, obtaining a foot-up brace from [facility] rather than an AFO and not improving her Berg score from baseline. Pt is progressing the remainder of her LTGs, demonstrating improvement in her gait speed and sit <>stand transfers but continues to rely heavily on bracing against mat table and reduced speed of movement  for balance. Pt and daughter in agreement to add 5 more weeks of PT for continued strength and gait gains and to allow pt time to have MRI performed to rule out potential orthopedic injury. Will send recertification today. Pt continues to be limited by pain, LLE weakness, impaired sustained attention and decreased safety awareness but has shown improvements in strength and endurance since eval. Continue POC     OBJECTIVE IMPAIRMENTS Abnormal gait, decreased activity tolerance, decreased balance, decreased coordination, decreased endurance, decreased knowledge of use of DME, decreased mobility, difficulty walking, decreased ROM, decreased strength, hypomobility, impaired perceived functional ability, impaired flexibility, impaired sensation, and postural dysfunction.    ACTIVITY LIMITATIONS community activity, meal prep, laundry, and shopping.    PERSONAL FACTORS Past/current experiences, Time since onset of injury/illness/exacerbation, and 1-2 comorbidities: see above  are also affecting patient's functional outcome.      REHAB POTENTIAL: Good   CLINICAL DECISION MAKING: Evolving/moderate complexity   EVALUATION COMPLEXITY: Moderate   PLAN: PT FREQUENCY: 2x/week   PT DURATION: 5 weeks (recert)    PLANNED INTERVENTIONS: Therapeutic exercises, Therapeutic activity, Neuromuscular re-education, Balance training, Gait training, Patient/Family education, Vestibular training, DME instructions, Aquatic Therapy, Electrical stimulation, and Manual therapy   PLAN FOR NEXT SESSION:  Aquatics? Did she call Dr. Marlou Sa? How is pain? How is HEP? sit<>stand practice, heel cord stretch. LLE strengthening.  Gait training w/rollator, foot-up brace and OLD recurvatum brace.  Practice w/turns and navigating obstacles. Eccentric quad strengthening, stair training, fwd/retro lunges, weighted step-ups, TM training on inclines.    Cruzita Lederer Leveda Kendrix, PT, DPT 03/04/22, 3:08 PM

## 2022-03-09 ENCOUNTER — Ambulatory Visit: Payer: Medicare Other | Attending: Family Medicine | Admitting: Physical Therapy

## 2022-03-09 ENCOUNTER — Encounter: Payer: Self-pay | Admitting: Physical Therapy

## 2022-03-09 DIAGNOSIS — R2689 Other abnormalities of gait and mobility: Secondary | ICD-10-CM | POA: Insufficient documentation

## 2022-03-09 DIAGNOSIS — M6281 Muscle weakness (generalized): Secondary | ICD-10-CM | POA: Diagnosis not present

## 2022-03-09 DIAGNOSIS — R2681 Unsteadiness on feet: Secondary | ICD-10-CM | POA: Diagnosis not present

## 2022-03-09 NOTE — Therapy (Signed)
OUTPATIENT PHYSICAL THERAPY TREATMENT NOTE  Patient Name: Buffi Ewton MRN: [id number] DOB:[birth year], 71 y.o., female Today's Date: 03/09/2022  PCP: Glenis Smoker, MD REFERRING PROVIDER: Courtney Heys, MD       PT End of Session - 03/09/22 1107     Visit Number 23    Number of Visits 84   Recert   Date for PT Re-Evaluation 32/20/25   Recert   Authorization Type Medicare/[employer] (needs 10th visit PN)    Progress Note Due on Visit 63    PT Start Time 1103    PT Stop Time 1147    PT Time Calculation (min) 44 min    Equipment Utilized During Treatment Other (comment)   Genu recurvatum knee orthosis on LLE and L foot up brace   Activity Tolerance Patient tolerated treatment well    Behavior During Therapy [facility] for tasks assessed/performed                     Past Medical History:  Diagnosis Date   Breast cancer ([place]) 2014   Right   CKD (chronic kidney disease), stage III ([place])    COVID-19 virus infection 12/2020   Hypertension    Multiple sclerosis ([place])    Seizures ([place])    Past Surgical History:  Procedure Laterality Date   BREAST SURGERY     MASTECTOMY Right 2014   Patient Active Problem List   Diagnosis Date Noted   CKD (chronic kidney disease), stage III ([place])    COVID-19 virus infection 12/2020   History of right breast cancer 03/13/2020   Status post right mastectomy 03/13/2020   Abnormal gait 09/15/2018   Claustrophobia 09/15/2018   Hypertension 09/15/2018   Localization-related epilepsy ([place]) 09/15/2018   Multiple sclerosis ([place]) 09/15/2018   Paresthesia 09/15/2018   Pain in lower limb 12/31/2015   Seizure ([place]) 12/31/2015   Neuropathic pain 06/02/2015   No advance directives 11/02/2012   Refusal of blood transfusion for reasons of conscience 11/02/2012   Malignant neoplasm of upper-outer quadrant of right breast in female, estrogen receptor positive ([place]) 11/01/2012    REFERRING DIAG: G35 (ICD-10-CM) - Multiple sclerosis  ([place])   THERAPY DIAG:  Unsteadiness on feet  Other abnormalities of gait and mobility  Muscle weakness (generalized)  PERTINENT HISTORY: relapsing remitting MS and breast CA and seizures. Pt has MS with LLE weakness greater than other limbs, with coexisting debility- uses cane to walk has had multiple near falls  PRECAUTIONS: Fall   SUBJECTIVE: Patient and daughter continue to reach out to MD office about MRI scheduling.  She states she is still waiting to hear back about insurance coverage of AFO from [employer] at [place].  (PT provided business card to assist in pt follow-up).  PAIN:  Are you having pain? Yes: NPRS scale: 6-7/10 Pain location: BLE Pain description: nerve pain Aggravating factors: none Relieving factors: meds   OBJECTIVE:    TODAY'S TREATMENT: -STS x5 > STS w/ RLE elevated on 2" step 2x6 reps w/ minA and facilitation of forward weight shift w/ verbal cues for glut engagement -Passive DF stretch of left ankle 3x45 sec w/ edu to pt and daughter regarding correct posture of foot during stretching per HEP.  Gait Training  Gait pattern: step through pattern, decreased step length- Right, decreased stance time- Left, decreased stride length, decreased hip/knee flexion- Left, decreased ankle dorsiflexion- Left, circumduction- Left, Left foot flat, genu recurvatum- Left, lateral lean- Right, wide BOS, and poor foot clearance- Left Distance  walked: 210' Assistive device utilized: Environmental consultant - 4 wheeled and L foot-up brace and L genu recurvatum brace Level of assistance: SBA Comments: Moderate toe drag on LLE at end of ambulatory distance.  Pt demonstrates decreased ability to correct ER posture of left foot when cued.  Rollator navigation w/ RLE gumdrop taps w/ minA for rollator management. Rollator management turning around gumdrops using lower visual cue to promote improved attention to task and visual scanning to promote safety with turning and approximation to  rollator.   PATIENT EDUCATION: Education details:  Continue HEP, follow-up with [facility] about insurance coverage of AFO. Person educated: Patient  Education method: Explanation  Education comprehension: verbalized understanding     HOME EXERCISE PROGRAM: Access Code: [email or web address] URL: [email or web address] Date: 01/14/2022 Prepared by: Mickie Bail Plaster  Exercises - Sit to Stand Without Arm Support  - 1 x daily - 7 x weekly - 1 sets - 10 reps - Supine Bridge  - 1 x daily - 7 x weekly - 1 sets - 10 reps - Standing March with Counter Support  - 1 x daily - 7 x weekly - 1 sets - 10 reps - Standing Hip Abduction with Counter Support  - 1 x daily - 7 x weekly - 1 sets - 10 reps - Standing Hip Extension with Counter Support  - 1 x daily - 7 x weekly - 1 sets - 10 reps - Side Stepping with Counter Support  - 1 x daily - 7 x weekly - 1 sets - 10 reps - Seated Ankle Pumps  - 1 x daily - 7 x weekly - 3 sets - 10 reps - Standing Ankle Dorsiflexion Stretch  - 1 x daily - 7 x weekly - 3 sets - 10 reps - 30 second hold    GOALS: Goals reviewed with patient? Yes   OLD LONG TERM GOALS:  Target date: 03/05/2022  Pt will be independent with final HEP in order to indicate improved functional mobility and dec fall risk. Baseline:  Goal status: IN PROGRESS  2.  Pt will obtain personal AFO and initiate wear schedule at home for improved safety with gait and community mobility  Baseline: Pt to meet with Gerald Stabs at [place] on 6/2 Goal status: NOT MET  3.  Pt will improve 5 x STS to less than or equal to 25 seconds without UE support to demonstrate improved functional strength and transfer efficiency.   Baseline: 31.44s w/UE support and significant posterior LOB, staggered stance (LLE fwd); 22.25s without UE support but heavy bracing against mat  Goal status: IN PROGRESS  4.  Pt will improve Berg score to 51/56 for decreased fall risk  Baseline: 46/56; 46/56 on 7/25  Goal status: NOT  MET  5. Pt will improve gait velocity to at least 1.5 ft/s w/LRAD mod I for improved gait efficiency  Baseline: 1.03 ft/s w/SBQC and CGA on 7/3; 1.24 ft/s w/rollator, L foot-up brace and L genu recurvatum brace   Goal Status: PROGRESSING    NEW LONG TERM GOALS FOR EXTENDED POC:  Target date: 04/08/2022  Pt will be independent with final HEP in order to indicate improved functional mobility and dec fall risk. Baseline:  Goal status: INITIAL  2.  Pt will improve 5 x STS to less than or equal to 25 seconds without UE support and no bracing against mat to demonstrate improved functional strength and transfer efficiency.  Baseline: 22.25s without UE support but heavy bracing against mat Goal status: INITIAL  3.  Pt will obtain personal AFO and initiate wear schedule at home for improved safety with gait and community mobility  Baseline: Pt obtained foot-up brace from [facility], not AFO Goal status: INITIAL  4.  Pt will schedule MRI of L knee to rule out potential orthopedic injury  Baseline:  Goal status: INITIAL  5.  Pt will ambulate >500' w/LRAD and L AFO for improved independence and functional mobility  Baseline:  Goal status: INITIAL      ASSESSMENT:   CLINICAL IMPRESSION: Focus of skilled session on improved weight bearing through LLE with functional transfer and upright tasks.  Pt continues to be challenged by quad and glut control worse on the left LE than the right.  Focused end of session on addressing ambulation with rollator and improving rollator management when turning.  PT to continue addressing deficits as outlined in current POC in order to progress towards LTGs.     OBJECTIVE IMPAIRMENTS Abnormal gait, decreased activity tolerance, decreased balance, decreased coordination, decreased endurance, decreased knowledge of use of DME, decreased mobility, difficulty walking, decreased ROM, decreased strength, hypomobility, impaired perceived functional ability, impaired  flexibility, impaired sensation, and postural dysfunction.    ACTIVITY LIMITATIONS community activity, meal prep, laundry, and shopping.    PERSONAL FACTORS Past/current experiences, Time since onset of injury/illness/exacerbation, and 1-2 comorbidities: see above  are also affecting patient's functional outcome.      REHAB POTENTIAL: Good   CLINICAL DECISION MAKING: Evolving/moderate complexity   EVALUATION COMPLEXITY: Moderate   PLAN: PT FREQUENCY: 2x/week   PT DURATION: 5 weeks (recert)    PLANNED INTERVENTIONS: Therapeutic exercises, Therapeutic activity, Neuromuscular re-education, Balance training, Gait training, Patient/Family education, Vestibular training, DME instructions, Aquatic Therapy, Electrical stimulation, and Manual therapy   PLAN FOR NEXT SESSION:  Aquatics? Did she call Dr. Marlou Sa? How is pain? How is HEP? sit<>stand practice, heel cord stretch. LLE strengthening.  Gait training w/rollator, foot-up brace and OLD recurvatum brace.  Practice w/turns and navigating obstacles. Eccentric quad strengthening, stair training, fwd/retro lunges, weighted step-ups, TM training on inclines.    Elease Etienne, PT, DPT 03/09/22, 11:49 AM

## 2022-03-12 ENCOUNTER — Ambulatory Visit: Payer: Medicare Other | Admitting: Physical Therapy

## 2022-03-12 DIAGNOSIS — R2681 Unsteadiness on feet: Secondary | ICD-10-CM | POA: Diagnosis not present

## 2022-03-12 DIAGNOSIS — M6281 Muscle weakness (generalized): Secondary | ICD-10-CM | POA: Diagnosis not present

## 2022-03-12 DIAGNOSIS — R2689 Other abnormalities of gait and mobility: Secondary | ICD-10-CM

## 2022-03-12 NOTE — Therapy (Signed)
OUTPATIENT PHYSICAL THERAPY TREATMENT NOTE  Patient Name: Selena Bender MRN: [id number] DOB:[birth year], 71 y.o., female Today's Date: 03/12/2022  PCP: Glenis Smoker, MD REFERRING PROVIDER: Courtney Heys, MD       PT End of Session - 03/12/22 1019     Visit Number 24    Number of Visits 61   Recert   Date for PT Re-Evaluation 38/45/36   Recert   Authorization Type Medicare/[employer] (needs 10th visit PN)    Progress Note Due on Visit 17    PT Start Time 1019    PT Stop Time 1100    PT Time Calculation (min) 41 min    Equipment Utilized During Treatment Other (comment);Gait belt   Genu recurvatum knee orthosis on LLE   Activity Tolerance Patient tolerated treatment well    Behavior During Therapy [facility] for tasks assessed/performed                      Past Medical History:  Diagnosis Date   Breast cancer ([place]) 2014   Right   CKD (chronic kidney disease), stage III ([place])    COVID-19 virus infection 12/2020   Hypertension    Multiple sclerosis ([place])    Seizures ([place])    Past Surgical History:  Procedure Laterality Date   BREAST SURGERY     MASTECTOMY Right 2014   Patient Active Problem List   Diagnosis Date Noted   CKD (chronic kidney disease), stage III ([place])    COVID-19 virus infection 12/2020   History of right breast cancer 03/13/2020   Status post right mastectomy 03/13/2020   Abnormal gait 09/15/2018   Claustrophobia 09/15/2018   Hypertension 09/15/2018   Localization-related epilepsy ([place]) 09/15/2018   Multiple sclerosis ([place]) 09/15/2018   Paresthesia 09/15/2018   Pain in lower limb 12/31/2015   Seizure ([place]) 12/31/2015   Neuropathic pain 06/02/2015   No advance directives 11/02/2012   Refusal of blood transfusion for reasons of conscience 11/02/2012   Malignant neoplasm of upper-outer quadrant of right breast in female, estrogen receptor positive ([place]) 11/01/2012    REFERRING DIAG: G35 (ICD-10-CM) - Multiple sclerosis ([place])    THERAPY DIAG:  Unsteadiness on feet  Other abnormalities of gait and mobility  Muscle weakness (generalized)  PERTINENT HISTORY: relapsing remitting MS and breast CA and seizures. Pt has MS with LLE weakness greater than other limbs, with coexisting debility- uses cane to walk has had multiple near falls  PRECAUTIONS: Fall   SUBJECTIVE:  Pt reports her usual aches and pains, tingling from waist down. No falls or other changes since last visit. Still unable to schedule MRI. Daughter plans to reach out to [place] at [place] still about AFO coverage. Pt reports she has not been doing her HEP aside from the sit to stands, plan to work on exercises before next session.  PAIN:  Are you having pain? Yes: NPRS scale: 6-7/10 Pain location: BLE Pain description: nerve pain Aggravating factors: none Relieving factors: meds   OBJECTIVE:    TODAY'S TREATMENT: NMR: In // bars: -attempted 6" step taps with BUE support with progression ot no UE support but unable to perform safely without UE support and with UE support task is not challenging for pt  -1" step tap with no UE support and min A for balance to encourage WB on LLE, 2 x 10 reps with focus on  slower, more controlled movements with improved balance noted with decreased speed  -4" eccentric step-down with RLE with focus  on unlocking L knee and quad control with lowering, difficulty unlocking knee and keeping LLE from IR, 2 x 10 reps with min A for balance -transition to 1" step-downs with RLE with improved control of LLE noted, x 10 reps with CGA for balance. Pt does have onset of L knee pain with this exercise so further repetitions deferred.  From elevated mat table: -Sit to stand with manual cues and assist for WB through LLE, x 5 reps -Sit to stand with 1" step under RLE to encourage weight shift to L and WB through LLE with sit to stand. Manual cues and assit for increased WB through LLE. Also focused on eccentric control when  sitting with min A needed for lowering and to maintain L knee flexion with transfer from stand to sit, 3 x 5 reps    PATIENT EDUCATION: Education details:  Continue HEP, follow-up with [employer] about insurance coverage of AFO.  Person educated: Patient  Education method: Explanation  Education comprehension: verbalized understanding     HOME EXERCISE PROGRAM: Access Code: [email or web address] URL: [email or web address] Date: 01/14/2022 Prepared by: Mickie Bail Plaster  Exercises - Sit to Stand Without Arm Support  - 1 x daily - 7 x weekly - 1 sets - 10 reps - Supine Bridge  - 1 x daily - 7 x weekly - 1 sets - 10 reps - Standing March with Counter Support  - 1 x daily - 7 x weekly - 1 sets - 10 reps - Standing Hip Abduction with Counter Support  - 1 x daily - 7 x weekly - 1 sets - 10 reps - Standing Hip Extension with Counter Support  - 1 x daily - 7 x weekly - 1 sets - 10 reps - Side Stepping with Counter Support  - 1 x daily - 7 x weekly - 1 sets - 10 reps - Seated Ankle Pumps  - 1 x daily - 7 x weekly - 3 sets - 10 reps - Standing Ankle Dorsiflexion Stretch  - 1 x daily - 7 x weekly - 3 sets - 10 reps - 30 second hold    GOALS: Goals reviewed with patient? Yes   OLD LONG TERM GOALS:  Target date: 03/05/2022  Pt will be independent with final HEP in order to indicate improved functional mobility and dec fall risk. Baseline:  Goal status: IN PROGRESS  2.  Pt will obtain personal AFO and initiate wear schedule at home for improved safety with gait and community mobility  Baseline: Pt to meet with Gerald Stabs at [place] on 6/2 Goal status: NOT MET  3.  Pt will improve 5 x STS to less than or equal to 25 seconds without UE support to demonstrate improved functional strength and transfer efficiency.   Baseline: 31.44s w/UE support and significant posterior LOB, staggered stance (LLE fwd); 22.25s without UE support but heavy bracing against mat  Goal status: IN PROGRESS  4.  Pt will  improve Berg score to 51/56 for decreased fall risk  Baseline: 46/56; 46/56 on 7/25  Goal status: NOT MET  5. Pt will improve gait velocity to at least 1.5 ft/s w/LRAD mod I for improved gait efficiency  Baseline: 1.03 ft/s w/SBQC and CGA on 7/3; 1.24 ft/s w/rollator, L foot-up brace and L genu recurvatum brace   Goal Status: PROGRESSING    NEW LONG TERM GOALS FOR EXTENDED POC:  Target date: 04/08/2022  Pt will be independent with final HEP in order to indicate improved functional mobility and dec fall  risk. Baseline:  Goal status: INITIAL  2.  Pt will improve 5 x STS to less than or equal to 25 seconds without UE support and no bracing against mat to demonstrate improved functional strength and transfer efficiency.  Baseline: 22.25s without UE support but heavy bracing against mat Goal status: INITIAL  3.  Pt will obtain personal AFO and initiate wear schedule at home for improved safety with gait and community mobility  Baseline: Pt obtained foot-up brace from [employer], not AFO Goal status: INITIAL  4.  Pt will schedule MRI of L knee to rule out potential orthopedic injury  Baseline:  Goal status: INITIAL  5.  Pt will ambulate >500' w/LRAD and L AFO for improved independence and functional mobility  Baseline:  Goal status: INITIAL     ASSESSMENT:   CLINICAL IMPRESSION: Emphasis of skilled PT session on addressing LLE strength deficits via NMR of L limb. Performed various exercises in // bars and at mat table as noted above to encourage activation of L quad and WB through limb for improved recovery. Pt reports feeling fatigued at end of session. Pt will continue to benefit from skilled therapy services to address balance and strength deficits impairing her ability to be independent with functional mobility. Continue POC.     OBJECTIVE IMPAIRMENTS Abnormal gait, decreased activity tolerance, decreased balance, decreased coordination, decreased endurance, decreased knowledge of  use of DME, decreased mobility, difficulty walking, decreased ROM, decreased strength, hypomobility, impaired perceived functional ability, impaired flexibility, impaired sensation, and postural dysfunction.    ACTIVITY LIMITATIONS community activity, meal prep, laundry, and shopping.    PERSONAL FACTORS Past/current experiences, Time since onset of injury/illness/exacerbation, and 1-2 comorbidities: see above  are also affecting patient's functional outcome.      REHAB POTENTIAL: Good   CLINICAL DECISION MAKING: Evolving/moderate complexity   EVALUATION COMPLEXITY: Moderate   PLAN: PT FREQUENCY: 2x/week   PT DURATION: 5 weeks (recert)    PLANNED INTERVENTIONS: Therapeutic exercises, Therapeutic activity, Neuromuscular re-education, Balance training, Gait training, Patient/Family education, Vestibular training, DME instructions, Aquatic Therapy, Electrical stimulation, and Manual therapy   PLAN FOR NEXT SESSION:  Aquatics? Did she call Dr. Marlou Sa? How is pain? How is HEP? sit<>stand practice, heel cord stretch. LLE strengthening.  Gait training w/rollator, foot-up brace and OLD recurvatum brace.  Practice w/turns and navigating obstacles. Eccentric quad strengthening, stair training, fwd/retro lunges, weighted step-ups, TM training on inclines, sit to stand with manual assist for increased WB on LLE.      Excell Seltzer, PT, DPT, CSRS  03/12/22, 11:03 AM

## 2022-03-17 ENCOUNTER — Ambulatory Visit: Payer: Medicare Other | Admitting: Physical Therapy

## 2022-03-19 ENCOUNTER — Ambulatory Visit: Payer: Medicare Other | Admitting: Physical Therapy

## 2022-03-23 ENCOUNTER — Ambulatory Visit: Payer: Medicare Other | Admitting: Physical Therapy

## 2022-03-25 ENCOUNTER — Ambulatory Visit: Payer: Medicare Other | Admitting: Physical Therapy

## 2022-03-26 ENCOUNTER — Ambulatory Visit: Payer: Medicare Other | Admitting: Physical Therapy

## 2022-03-30 ENCOUNTER — Ambulatory Visit: Payer: Medicare Other | Admitting: Physical Therapy

## 2022-04-01 ENCOUNTER — Ambulatory Visit: Payer: Medicare Other | Admitting: Physical Therapy

## 2022-04-08 ENCOUNTER — Ambulatory Visit: Payer: Medicare Other | Admitting: Physical Therapy

## 2022-04-08 DIAGNOSIS — R2681 Unsteadiness on feet: Secondary | ICD-10-CM | POA: Diagnosis not present

## 2022-04-08 DIAGNOSIS — M6281 Muscle weakness (generalized): Secondary | ICD-10-CM

## 2022-04-08 DIAGNOSIS — R2689 Other abnormalities of gait and mobility: Secondary | ICD-10-CM | POA: Diagnosis not present

## 2022-04-08 NOTE — Therapy (Signed)
OUTPATIENT PHYSICAL THERAPY TREATMENT NOTE- DISCHARGE SUMMARY   Patient Name: Selena Bender MRN: [id number] DOB:1950/11/18, 71 y.o., female Today's Date: 04/08/2022  PCP: Glenis Smoker, MD REFERRING PROVIDER: Courtney Heys, MD   PHYSICAL THERAPY DISCHARGE SUMMARY  Visits from Start of Care: 25  Current functional level related to goals / functional outcomes: Requires AD for mobility, CGA for transfers and gait    Remaining deficits: Decreased BLE strength (RLE > LLE), gait impairments 2/2 MS and RLE weakness, genu recurvatum of RLE, pain and decreased activity tolerance   Education / Equipment: HEP   Patient agrees to discharge. Patient goals were not met. Patient is being discharged due to  missing multiple weeks of therapy, need to prioritize scheduling MRI of L knee.      PT End of Session - 04/08/22 1113     Visit Number 25    Number of Visits 22   Recert   Date for PT Re-Evaluation 01/75/10   Recert   Authorization Type Medicare/Cigna (needs 10th visit PN)    Progress Note Due on Visit 30    PT Start Time 1104    PT Stop Time 1136   DC   PT Time Calculation (min) 32 min    Equipment Utilized During Treatment --    Activity Tolerance Patient tolerated treatment well    Behavior During Therapy [facility] for tasks assessed/performed                      Past Medical History:  Diagnosis Date   Breast cancer ([place]) 2014   Right   CKD (chronic kidney disease), stage III ([place])    COVID-19 virus infection 12/2020   Hypertension    Multiple sclerosis ([place])    Seizures ([place])    Past Surgical History:  Procedure Laterality Date   BREAST SURGERY     MASTECTOMY Right 2014   Patient Active Problem List   Diagnosis Date Noted   CKD (chronic kidney disease), stage III ([place])    COVID-19 virus infection 12/2020   History of right breast cancer 03/13/2020   Status post right mastectomy 03/13/2020   Abnormal gait 09/15/2018   Claustrophobia  09/15/2018   Hypertension 09/15/2018   Localization-related epilepsy ([place]) 09/15/2018   Multiple sclerosis ([place]) 09/15/2018   Paresthesia 09/15/2018   Pain in lower limb 12/31/2015   Seizure ([place]) 12/31/2015   Neuropathic pain 06/02/2015   No advance directives 11/02/2012   Refusal of blood transfusion for reasons of conscience 11/02/2012   Malignant neoplasm of upper-outer quadrant of right breast in female, estrogen receptor positive ([place]) 11/01/2012    REFERRING DIAG: G35 (ICD-10-CM) - Multiple sclerosis ([place])   THERAPY DIAG:  Unsteadiness on feet  Other abnormalities of gait and mobility  Muscle weakness (generalized)  PERTINENT HISTORY: relapsing remitting MS and breast CA and seizures. Pt has MS with LLE weakness greater than other limbs, with coexisting debility- uses cane to walk has had multiple near falls  PRECAUTIONS: Fall   SUBJECTIVE:  Pt reports continued high levels of pain, still has not been able to schedule MRI. Missed multiple weeks of PT due to lack of transportation. Has been performing a few of her exercises, still very challenging.   PAIN:  Are you having pain? Yes: NPRS scale: 7/10 Pain location: BLE Pain description: nerve pain Aggravating factors: none Relieving factors: meds   OBJECTIVE:    TODAY'S TREATMENT: Self-care/home management Discussed POC moving forward and pt in agreement to DC  from PT at this time. Pt has missed multiple weeks of therapy in August due to lack of transportation (daughter was out of town), has been unable to schedule MRI of her L knee and has continued high pain levels. At this time, pt and daughter in agreement to take a break from PT to prioritize getting MRI of L knee and plan to obtain new PT referral from Dr. Berline Chough at her follow-up appointment in October. Educated pt and daughter on how to obtain new referral prior to October if they are ready to return. Pt has also not made significant progress in therapy due to high  pain levels,  Provided new handout of HEP and verbally reviewed each exercise, as pt has only been performing 2-3 out of the 8 exercises provided  Noted pt ambulated into clinic with [facility] w/quad tip and no genu recurvatum brace. Educated pt on safety w/ambulation and importance of using rollator at all times, as pt very unsteady w/cane and frequently runs into obstacles on R side due to significant R lean.    PATIENT EDUCATION: Education details:  See above Person educated: Patient  Education method: Explanation  Education comprehension: verbalized understanding     HOME EXERCISE PROGRAM: Access Code: [email or web address] URL: [email or web address] Date: 01/14/2022 Prepared by: Alethia Berthold Kilynn Fitzsimmons  Exercises - Sit to Stand Without Arm Support  - 1 x daily - 7 x weekly - 1 sets - 10 reps - Supine Bridge  - 1 x daily - 7 x weekly - 1 sets - 10 reps - Standing March with Counter Support  - 1 x daily - 7 x weekly - 1 sets - 10 reps - Standing Hip Abduction with Counter Support  - 1 x daily - 7 x weekly - 1 sets - 10 reps - Standing Hip Extension with Counter Support  - 1 x daily - 7 x weekly - 1 sets - 10 reps - Side Stepping with Counter Support  - 1 x daily - 7 x weekly - 1 sets - 10 reps - Seated Ankle Pumps  - 1 x daily - 7 x weekly - 3 sets - 10 reps - Standing Ankle Dorsiflexion Stretch  - 1 x daily - 7 x weekly - 3 sets - 10 reps - 30 second hold    GOALS: Goals reviewed with patient? Yes   OLD LONG TERM GOALS:  Target date: 03/05/2022  Pt will be independent with final HEP in order to indicate improved functional mobility and dec fall risk. Baseline:  Goal status: IN PROGRESS  2.  Pt will obtain personal AFO and initiate wear schedule at home for improved safety with gait and community mobility  Baseline: Pt to meet with Thayer Ohm at [place] on 6/2 Goal status: NOT MET  3.  Pt will improve 5 x STS to less than or equal to 25 seconds without UE support to demonstrate improved  functional strength and transfer efficiency.   Baseline: 31.44s w/UE support and significant posterior LOB, staggered stance (LLE fwd); 22.25s without UE support but heavy bracing against mat  Goal status: IN PROGRESS  4.  Pt will improve Berg score to 51/56 for decreased fall risk  Baseline: 46/56; 46/56 on 7/25  Goal status: NOT MET  5. Pt will improve gait velocity to at least 1.5 ft/s w/LRAD mod I for improved gait efficiency  Baseline: 1.03 ft/s w/SBQC and CGA on 7/3; 1.24 ft/s w/rollator, L foot-up brace and L genu recurvatum brace   Goal Status: PROGRESSING  NEW LONG TERM GOALS FOR EXTENDED POC:  Target date: 04/08/2022  Pt will be independent with final HEP in order to indicate improved functional mobility and dec fall risk. Baseline:  Goal status: MET  2.  Pt will improve 5 x STS to less than or equal to 25 seconds without UE support and no bracing against mat to demonstrate improved functional strength and transfer efficiency.  Baseline: 22.25s without UE support but heavy bracing against mat Goal status: DISCONTINUED   3.  Pt will obtain personal AFO and initiate wear schedule at home for improved safety with gait and community mobility  Baseline: Pt obtained foot-up brace from [employer], not AFO Goal status: NOT MET  4.  Pt will schedule MRI of L knee to rule out potential orthopedic injury  Baseline:  Goal status: NOT MET  5.  Pt will ambulate >500' w/LRAD and L AFO for improved independence and functional mobility  Baseline:  Goal status: DISCONTINUED      ASSESSMENT:   CLINICAL IMPRESSION: Emphasis of skilled PT session on DC from PT. Pt has not attended therapy for multiple weeks due to transportation and has been unable to schedule MRI of L knee, which continues to limit her in PT. At this time, recommending DC from PT to allow pt time to obtain MRI and plan to restart PT in October. Pt has met 1/5 LTGs, performing her HEP independently. However, pt has not  scheduled MRI or obtained AFO from [facility]. Remaining 2 goals discontinued due to being unable to assess in clinic as pt did not have on correct shoes or brace and deferred exercise today. Informed pt and daughter on how to obtain referral for PT prior to October if they decide they would like to return sooner. Pt and daughter verbalized understanding and agreement to DC from PT today.      OBJECTIVE IMPAIRMENTS Abnormal gait, decreased activity tolerance, decreased balance, decreased coordination, decreased endurance, decreased knowledge of use of DME, decreased mobility, difficulty walking, decreased ROM, decreased strength, hypomobility, impaired perceived functional ability, impaired flexibility, impaired sensation, and postural dysfunction.    ACTIVITY LIMITATIONS community activity, meal prep, laundry, and shopping.    PERSONAL FACTORS Past/current experiences, Time since onset of injury/illness/exacerbation, and 1-2 comorbidities: see above  are also affecting patient's functional outcome.      REHAB POTENTIAL: Good   CLINICAL DECISION MAKING: Evolving/moderate complexity   EVALUATION COMPLEXITY: Moderate   PLAN: PT FREQUENCY: 2x/week   PT DURATION: 5 weeks (recert)    PLANNED INTERVENTIONS: Therapeutic exercises, Therapeutic activity, Neuromuscular re-education, Balance training, Gait training, Patient/Family education, Vestibular training, DME instructions, Aquatic Therapy, Electrical stimulation, and Manual therapy       [place], PT, [place] [address] [place], [place]  [address] Phone:  [phone] Fax:  [phone] 04/08/22, 11:36 AM

## 2022-04-27 DIAGNOSIS — R059 Cough, unspecified: Secondary | ICD-10-CM | POA: Diagnosis not present

## 2022-04-27 DIAGNOSIS — I10 Essential (primary) hypertension: Secondary | ICD-10-CM | POA: Diagnosis not present

## 2022-04-27 DIAGNOSIS — Z20822 Contact with and (suspected) exposure to covid-19: Secondary | ICD-10-CM | POA: Diagnosis not present

## 2022-06-02 ENCOUNTER — Encounter
Payer: Medicare Other | Attending: Physical Medicine and Rehabilitation | Admitting: Physical Medicine and Rehabilitation

## 2022-06-02 ENCOUNTER — Encounter: Payer: Self-pay | Admitting: Physical Medicine and Rehabilitation

## 2022-06-02 VITALS — BP 134/87 | HR 99 | Ht 65.0 in | Wt 148.2 lb

## 2022-06-02 DIAGNOSIS — G35 Multiple sclerosis: Secondary | ICD-10-CM | POA: Insufficient documentation

## 2022-06-02 DIAGNOSIS — M792 Neuralgia and neuritis, unspecified: Secondary | ICD-10-CM | POA: Diagnosis not present

## 2022-06-02 DIAGNOSIS — G894 Chronic pain syndrome: Secondary | ICD-10-CM | POA: Insufficient documentation

## 2022-06-02 DIAGNOSIS — Z79891 Long term (current) use of opiate analgesic: Secondary | ICD-10-CM | POA: Insufficient documentation

## 2022-06-02 DIAGNOSIS — Z5181 Encounter for therapeutic drug level monitoring: Secondary | ICD-10-CM | POA: Insufficient documentation

## 2022-06-02 MED ORDER — TRAMADOL HCL 50 MG PO TABS: 50.0000 mg | ORAL_TABLET | Freq: Four times a day (QID) | ORAL | 0 refills | Status: DC | PRN

## 2022-06-02 NOTE — Addendum Note (Signed)
Addended by: Dessa Phi D on: 06/02/2022 11:54 AM   Modules accepted: Orders

## 2022-06-02 NOTE — Progress Notes (Signed)
Subjective:    Patient ID: Selena Bender, female    DOB: [birth year], 71 y.o.   MRN: [id number]  HPI   Pt is a 71 yr old R handed female with hx of relapsing remitting MS and breast CA and seizures here for evaluation of chronic pain in B/L arms and chest due to mastectomy- dx'd 2014- and in remission.  A lot of nerve pain, likely from MS.  Here for f/u on chronic pain/MS.    Cymbalta- was a waste of money- didn't hep at all- still taking, but not helpful-   Has the worst burning/fire pains- from waist down- legs/feet ankles.    Already tried low dose naltrexone- didn't work in March-  Gabapentin not working at all- so stopped it. Didn't work- no side effects-   Has taken Lamictal- in past- off for a few months- didn't help nerve pain- actually took for seizure prophylaxis.  Ran out of ~ 1 year ago- nerve pain didn't get worse when ran out of Lamictal.    Tramadol- never got it.   Pain Inventory Average Pain 8 Pain Right Now 9 My pain is burning, stabbing, and aching  LOCATION OF PAIN  Shoulder, hand, fingers, back, buttocks, groin, thigh,leg, ankle, toe   BOWEL Number of stools per week: 7   BLADDER Normal    Mobility walk with assistance use a cane use a walker how many minutes can you walk? 10 ability to climb steps?  yes do you drive?  no Do you have any goals in this area?  yes  Function not employed: date last employed .  Neuro/Psych weakness trouble walking depression anxiety  Prior Studies Any changes since last visit?  no  Physicians involved in your care Any changes since last visit?  no   Family History  Problem Relation Age of Onset   Sickle cell anemia Father    Social History   Socioeconomic History   Marital status: Widowed    Spouse name: Not on file   Number of children: 2   Years of education: Not on file   Highest education level: Not on file  Occupational History   Not on file  Tobacco Use   Smoking status:  Former    Types: Cigarettes   Smokeless tobacco: Never  Vaping Use   Vaping Use: Never used  Substance and Sexual Activity   Alcohol use: Yes    Comment: occ   Drug use: Not Currently    Types: Marijuana    Comment: Gummies   Sexual activity: Not Currently  Other Topics Concern   Not on file  Social History Narrative   Not on file   Social Determinants of Health   Financial Resource Strain: Not on file  Food Insecurity: Not on file  Transportation Needs: Not on file  Physical Activity: Not on file  Stress: Not on file  Social Connections: Not on file   Past Surgical History:  Procedure Laterality Date   BREAST SURGERY     MASTECTOMY Right 2014   Past Medical History:  Diagnosis Date   Breast cancer ([place]) 2014   Right   CKD (chronic kidney disease), stage III ([place])    COVID-19 virus infection 12/2020   Hypertension    Multiple sclerosis ([facility])    Seizures ([facility])    BP 134/87   Pulse 99   Ht '5\' 5"'$  (1.651 m)   Wt 148 lb 3.2 oz (67.2 kg)   SpO2 98%   BMI  24.66 kg/m   Opioid Risk Score:   Fall Risk Score:  `1  Depression screen [facility] 2/9     06/02/2022   11:17 AM 02/05/2022    1:42 PM 10/28/2021   10:45 AM 09/03/2020   10:07 AM 06/02/2020    2:30 PM 04/17/2020   11:01 AM 11/30/2019   11:09 AM  Depression screen PHQ 2/9  Decreased Interest 0 1 2 0 0 0 0  Down, Depressed, Hopeless 0 1 2 0 0 0 0  PHQ - 2 Score 0 2 4 0 0 0 0  Altered sleeping   2      Tired, decreased energy   3      Change in appetite   0      Feeling bad or failure about yourself    0      Trouble concentrating   1      Moving slowly or fidgety/restless   2      Suicidal thoughts   0      PHQ-9 Score   12      Difficult doing work/chores   Extremely dIfficult          Review of Systems  All other systems reviewed and are negative.     Objective:   Physical Exam Awake, alert, appropriate, here with family member, NAD HHA to walk into appointment  No change in nerve pain- is the  same- LE's entire leg B/L       Assessment & Plan:   Pt is a 71 yr old R handed female with hx of relapsing remitting MS and breast CA and seizures here for evaluation of chronic pain in B/L arms and chest due to mastectomy- dx'd 2014- and in remission.  A lot of nerve pain, likely from MS.  Here for f/u on chronic pain/MS.    Urine Drug/opiate contract today-   2.  Tramadol-  50 mg- q 6hours as needed-  1 week's supply uup to 4 tabs/day- - when UDS/drug screen comes back, will prescribe 1 month at a time-   3. If you take tramadol with a tylenol-  is synergistic with tramadol- 325 mg- makes tramadol last longer and maybe a little stronger-   4. When trying to come off Duloxetine- go to 30 mg daily x 2 weeks then cane stop. Wait until 1 week of tramadol taken, THEN wean off Duloxetine.  5. Has already tried and failed- Gabapentin, Duloxetine, Low dose naltrexone-  lamictal- as well as Trileptal- hasn't tried Lyrica- might add/use next with tramadol.    6. F/U in 37month- CALL ME if meds don't work- call me in 1 week- next Tuesday- let me know how tramadol works and then write for 1 month's supply with refills.     I spent a total of  20   minutes on total care today- >50% coordination of care- due to discussing options for nerve pain control.

## 2022-06-02 NOTE — Patient Instructions (Signed)
Pt is a 71 yr old R handed female with hx of relapsing remitting MS and breast CA and seizures here for evaluation of chronic pain in B/L arms and chest due to mastectomy- dx'd 2014- and in remission.  A lot of nerve pain, likely from MS.  Here for f/u on chronic pain/MS.    Urine Drug/opiate contract today-   2.  Tramadol-  50 mg- q 6hours as needed-  1 week's supply uup to 4 tabs/day- - when UDS/drug screen comes back, will prescribe 1 month at a time-   3. If you take tramadol with a tylenol-  is synergistic with tramadol- 325 mg- makes tramadol last longer and maybe a little stronger-   4. When trying to come off Duloxetine- go to 30 mg daily x 2 weeks then cane stop. Wait until 1 week of tramadol taken, THEN wean off Duloxetine.  5. Has already tried and failed- Gabapentin, Duloxetine, Low dose naltrexone-  lamictal- as well as Trileptal- hasn't tried Lyrica- might add/use next with tramadol.    6. F/U in 25month- CALL ME if meds don't work- call me in 1 week- next Tuesday- let me know how tramadol works and then write for 1 month's supply with refills.

## 2022-06-05 LAB — DRUG TOX MONITOR 1 W/CONF, ORAL FLD

## 2022-06-05 LAB — DRUG TOX ALC METAB W/CON, ORAL FLD: Alcohol Metabolite: NEGATIVE ng/mL (ref <25)

## 2022-06-09 ENCOUNTER — Other Ambulatory Visit: Payer: Self-pay | Admitting: Family Medicine

## 2022-06-09 ENCOUNTER — Telehealth: Payer: Self-pay

## 2022-06-09 DIAGNOSIS — Z1231 Encounter for screening mammogram for malignant neoplasm of breast: Secondary | ICD-10-CM

## 2022-06-09 NOTE — Telephone Encounter (Signed)
Patient called back for her drug screen results and Rx. Call back ph [phone]. ([facility] [address] [facility] [place]

## 2022-06-10 MED ORDER — TRAMADOL HCL 50 MG PO TABS: 50.0000 mg | ORAL_TABLET | Freq: Four times a day (QID) | ORAL | 5 refills | Status: DC | PRN

## 2022-06-10 NOTE — Addendum Note (Signed)
Addended by: Courtney Heys on: 06/10/2022 11:00 AM   Modules accepted: Orders

## 2022-06-16 DIAGNOSIS — Z23 Encounter for immunization: Secondary | ICD-10-CM | POA: Diagnosis not present

## 2022-06-23 ENCOUNTER — Ambulatory Visit
Admission: RE | Admit: 2022-06-23 | Discharge: 2022-06-23 | Disposition: A | Discharge status: Home / Self Care | Payer: Medicare Other | Source: Ambulatory Visit | Attending: Family Medicine | Admitting: Family Medicine

## 2022-06-23 DIAGNOSIS — Z1231 Encounter for screening mammogram for malignant neoplasm of breast: Secondary | ICD-10-CM

## 2022-06-23 DIAGNOSIS — R1013 Epigastric pain: Secondary | ICD-10-CM | POA: Diagnosis not present

## 2022-06-23 DIAGNOSIS — N1832 Chronic kidney disease, stage 3b: Secondary | ICD-10-CM | POA: Diagnosis not present

## 2022-06-23 DIAGNOSIS — R42 Dizziness and giddiness: Secondary | ICD-10-CM | POA: Diagnosis not present

## 2022-09-03 ENCOUNTER — Telehealth: Payer: Self-pay

## 2022-09-03 ENCOUNTER — Encounter
Payer: Medicare Other | Attending: Physical Medicine and Rehabilitation | Admitting: Physical Medicine and Rehabilitation

## 2022-09-03 ENCOUNTER — Encounter: Payer: Self-pay | Admitting: Physical Medicine and Rehabilitation

## 2022-09-03 VITALS — BP 137/83 | HR 90 | Ht 65.0 in | Wt 152.0 lb

## 2022-09-03 DIAGNOSIS — M792 Neuralgia and neuritis, unspecified: Secondary | ICD-10-CM

## 2022-09-03 DIAGNOSIS — R269 Unspecified abnormalities of gait and mobility: Secondary | ICD-10-CM | POA: Diagnosis not present

## 2022-09-03 DIAGNOSIS — G35 Multiple sclerosis: Secondary | ICD-10-CM | POA: Diagnosis not present

## 2022-09-03 MED ORDER — METHADONE HCL 5 MG/5ML PO SOLN: 2.5000 mg | Freq: Two times a day (BID) | ORAL | 0 refills | Status: DC

## 2022-09-03 NOTE — Patient Instructions (Signed)
Pt is a 72 yr old R handed female with hx of relapsing remitting MS and breast CA and seizures here for evaluation of chronic pain in B/L arms and chest due to mastectomy- dx'd 2014- and in remission.  A lot of nerve pain, likely from MS.  Here for f/u on chronic pain/MS.   Discussed methadone-  vs Belbua vs Nucynta- that's it's an intrinsically long acting pain medicine.  2. Also discussed addiction and risk of tolerance- which is body getting used to medicine- .   3. Methadone 2.5 mg PO liquid 2x/day wait two weeks before trying to inrease dose to 5 mg 2x/day- for nerve pain  Can start out as low at 1 mg 2x/day.   4. Discussed length of use- if nerve pain is permanent- then need to treat permanently.   5.  F/U in 3 months, but call me in 1 month for refill, let me know how things going.   6. If things AREN'T better, also let me know, so can try Belbuca or something in that line.

## 2022-09-03 NOTE — Progress Notes (Signed)
Subjective:    Patient ID: Selena Bender, female    DOB: 08/07/1951, 72 y.o.   MRN: [id number]  HPI  Pt is a 72 yr old R handed female with hx of relapsing remitting MS and breast CA and seizures here for evaluation of chronic pain in B/L arms and chest due to mastectomy- dx'd 2014- and in remission.  A lot of nerve pain, likely from MS.  Here for f/u on chronic pain/MS.    Has been Taking Tramadol- 2 pills in AM and 2 pills in evening.  Doesn't feel like it helps at all.   Thinks tramadol brought pain down a little at beginning.  Then stopped working after ~ 1 month.       Pain Inventory Average Pain 9 Pain Right Now 9 My pain is burning, stabbing, and aching  LOCATION OF PAIN  Shoulder, Hand, Fingers, Back, Buttocks, Hip, Thigh, Leg, Toes   BOWEL Number of stools per week: 7   BLADDER Normal    Mobility use a cane use a walker how many minutes can you walk? 5 minutes  do you drive?  no  Function disabled: date disabled . retired I need assistance with the following:  meal prep, household duties, and shopping  Neuro/Psych weakness tingling trouble walking spasms depression  Prior Studies Any changes since last visit?  no  Physicians involved in your care Any changes since last visit?  no   Family History  Problem Relation Age of Onset   Sickle cell anemia Father    Breast cancer Neg Hx    Social History   Socioeconomic History   Marital status: Widowed    Spouse name: Not on file   Number of children: 2   Years of education: Not on file   Highest education level: Not on file  Occupational History   Not on file  Tobacco Use   Smoking status: Former    Types: Cigarettes   Smokeless tobacco: Never  Vaping Use   Vaping Use: Never used  Substance and Sexual Activity   Alcohol use: Yes    Comment: occ   Drug use: Not Currently    Types: Marijuana    Comment: Gummies   Sexual activity: Not Currently  Other Topics Concern    Not on file  Social History Narrative   Not on file   Social Determinants of Health   Financial Resource Strain: Not on file  Food Insecurity: Not on file  Transportation Needs: Not on file  Physical Activity: Not on file  Stress: Not on file  Social Connections: Not on file   Past Surgical History:  Procedure Laterality Date   BREAST SURGERY     MASTECTOMY Right 2014   Past Medical History:  Diagnosis Date   Breast cancer ([place]) 2014   Right   CKD (chronic kidney disease), stage III ([place])    COVID-19 virus infection 12/2020   Hypertension    Multiple sclerosis (HCC)    Seizures (HCC)    BP 137/83   Pulse 90   Ht '5\' 5"'$  (1.651 m)   Wt 152 lb (68.9 kg)   SpO2 98%   BMI 25.29 kg/m   Opioid Risk Score:   Fall Risk Score:  `1  Depression screen [facility] 2/9     09/03/2022   10:54 AM 06/02/2022   11:17 AM 02/05/2022    1:42 PM 10/28/2021   10:45 AM 09/03/2020   10:07 AM 06/02/2020    2:30 PM  04/17/2020   11:01 AM  Depression screen PHQ 2/9  Decreased Interest 0 0 1 2 0 0 0  Down, Depressed, Hopeless 0 0 1 2 0 0 0  PHQ - 2 Score 0 0 2 4 0 0 0  Altered sleeping    2     Tired, decreased energy    3     Change in appetite    0     Feeling bad or failure about yourself     0     Trouble concentrating    1     Moving slowly or fidgety/restless    2     Suicidal thoughts    0     PHQ-9 Score    12     Difficult doing work/chores    Extremely dIfficult         Review of Systems  Musculoskeletal:  Positive for back pain, gait problem and joint swelling.       Shoulder, Hand, Fingers, Back, Buttocks, Hip, Thigh, Leg, Toes   All other systems reviewed and are negative.     Objective:   Physical Exam  Awake, alert, appropriate, accompanied by family member, NAD C/o nerve pain      Assessment & Plan:  Pt is a 72 yr old R handed female with hx of relapsing remitting MS and breast CA and seizures here for evaluation of chronic pain in B/L arms and chest due to  mastectomy- dx'd 2014- and in remission.  A lot of nerve pain, likely from MS.  Here for f/u on chronic pain/MS.   Discussed methadone-  vs Belbua vs Nucynta- that's it's an intrinsically long acting pain medicine.  2. Also discussed addiction and risk of tolerance- which is body getting used to medicine- .   3. Methadone 2.5 mg PO liquid 2x/day wait two weeks before trying to inrease dose to 5 mg 2x/day- for nerve pain  Can start out as low at 1 mg 2x/day.   4. Discussed length of use- if nerve pain is permanent- then need to treat permanently.   5.  F/U in 3 months, but call me in 1 month for refill, let me know how things going.   6. If things AREN'T better, also let me know, so can try Belbuca or something in that line.    I spent a total of  31  minutes on total care today- >50% coordination of care- due to prolonged d/w pt and family about methadone vs other meds for nerve pain.

## 2022-09-23 NOTE — Progress Notes (Unsigned)
NEUROLOGY FOLLOW UP OFFICE NOTE  [name]  Assessment/Plan:   1.  Multiple sclerosis 2.  Focal onset seizures with impaired consciousness, stable 3.  Cervical spinal stenosis.  There is some cord mass effect at C4-5 level.  Didn't see neurosurgery.  Denies any progression of symptoms over past year.  Unlikely contributing to her symptoms.  Will continue to monitor. 4.  Hyperextension of left knee - causes instability on her feet which is a concern for her. 5.  Hypertension   1.  Seizure prophylaxis: Restart lamotrigine titrating from 3m daily to 545mtwice daily.   3.  D3 4000 IU daily 4.  Repeat MRI of brain and cervical spine with and without contrast. 5.  Follow up in one year or sooner if needed.  Subjective:  Selena Bender a [age]ear old right-handed black female with hypertension and history of breast cancer who follows up for multiple sclerosis and seizure.  Accompanied by her daughter who supplements history.    UPDATE: Current disease modifying therapy:  None Current seizure prophylaxis: none Current medications:  vitamin D 4000 IU daily  No seizures, however she has not been on lamotrigine for a year.  She ran out of refills and didn't inquire about a new prescription.    09/24/2021 LABS:  B6 32.7, B12 755, D 73.18.  Advised to discontinue any B12 supplementation.     Vision:  OK.  Dysconjugate gaze. Motor:  Left foot weakness.  Sometimes her left hand cramps and closes, toes on left foot cramp. She reports that when she stands, her left knee "goes back", causing balance problems.  No pain Sensory:  She has neuropathy involving the right leg.   Pain:  Worsening burning pain in the legs from the hips down.  No back pain.  Gabapentin ineffective.  Followed by pain management.  Taking tramadol.  Prescribed methadone but hasn't picked it up.   Gait:  Unsteady.  Tendency to lean backwards.  Left foot drags.  Poor balance Bowel/Bladder:  No  issues. Fatigue:  Yes but manageable. Cognition:  Once in awhile she may have word-finding difficulty. Mood:  Some depression from time to time.  Not chronic.     HISTORY: Multiple Sclerosis: She had an initial flare up when she was in her 3030s She had an episode of numbness from the waist down and difficulty with fine-finger movement, lasting 3-4 months.  After several years, she then started dragging her left foot.  She was formally diagnosed with multiple sclerosis in 2005 after MRI and CSF analysis.  She was started on Copaxone but stopped about 2 years ago.  She has not had any relapses but has had a gradual decline over the last several years, primary affecting her balance and gait.     Past disease modifying therapy:  Copaxone (stopped in 2017- 2018 because she no longer had a neurologist)  Past pain medications:  gabapentin, pregablin, duloxetine    Seizure Disorder: She also has a seizure disorder, possibly secondary to MS.  She has had a total of 3 seizures.  Last seizure was in the mid-90s.   Past anti-epileptic medications:  Dilantin   Imaging: 06/06/2020 MRI BRAIN W WO:  Stable 06/06/2020 MRI CERVICAL W WO:  multifocal MS cord lesions with similar multilevel degenerative changes causing similar mass effect on the left eccentric cord at C4-5 and C5-6 with severe bilateral foraminal stenosis.  05/11/2019 MRI BRAIN W WO:  Advanced chronic demyelinating disease  involving the cerebral white matter and cortex as as well as suspected superimposed chronic small vessel ischemia particularly involving the right caudate and cerebellum. 05/11/2019 MRI CERVICAL W WO:  Chronic multifocal patchy demyelination involving the spinal cord and cervicomedullary junction, with severe involvement of left hemicord at C4 level.  Degenerative cervical spine disease with moderate spinal stenosis causing mass effect on left hemicord at C4-5 with moderate to severe bilateral foraminal stenosis; lesser spinal  stenosis at C6-7; moderate to severe bilateral foraminal stenosis at C6-7 and C7-T1.   PAST MEDICAL HISTORY: Past Medical History:  Diagnosis Date   Breast cancer ([place]) 2014   Right   CKD (chronic kidney disease), stage III ([place])    COVID-19 virus infection 12/2020   Hypertension    Multiple sclerosis (HCC)    Seizures (HCC)     MEDICATIONS: Current Outpatient Medications on File Prior to Visit  Medication Sig Dispense Refill   albuterol (VENTOLIN HFA) 108 (90 Base) MCG/ACT inhaler INHALE 1 TO 2 PUFFS EVERY 4 HOURS AS NEEDED     ascorbic acid (VITAMIN C) 500 MG/5ML syrup Take by mouth daily.     cholecalciferol (VITAMIN D) 25 MCG (1000 UNIT) tablet Take 4 tablets (4,000 Units total) by mouth daily. 90 tablet 3   fluticasone (FLONASE) 50 MCG/ACT nasal spray Place 1 spray into both nostrils at bedtime. 16 g 6   loratadine (CLARITIN) 10 MG tablet Take 1 tablet (10 mg total) by mouth daily. 30 tablet 11   losartan (COZAAR) 100 MG tablet Take 1 tablet (100 mg total) by mouth daily. 90 tablet 3   methadone (DOLOPHINE) 1 MG/1ML solution Take 2.5 mLs (2.5 mg total) by mouth every 12 (twelve) hours. For 2 weeks, then increase to 5 mg 2x/day- for nerve pain- giving solution so can use smaller dose if necessary 300 mL 0   promethazine (PHENERGAN) 12.5 MG tablet Take 1 tablet (12.5 mg total) by mouth every 6 (six) hours as needed for nausea or vomiting. 60 tablet 1   traMADol (ULTRAM) 50 MG tablet Take 1 tablet (50 mg total) by mouth every 6 (six) hours as needed. 120 tablet 5   No current facility-administered medications on file prior to visit.    ALLERGIES: No Known Allergies  FAMILY HISTORY: Family History  Problem Relation Age of Onset   Sickle cell anemia Father    Breast cancer Neg Hx       Objective:  Blood pressure (!) 142/80, pulse 95, height 5' 4"$  (1.626 m), weight 154 lb 3.2 oz (69.9 kg), SpO2 98 %. General: No acute distress.  Patient appears well-groomed.   Head:   Normocephalic/atraumatic Eyes:  Fundi examined but not visualized Neck: supple, no paraspinal tenderness, full range of motion Heart:  Regular rate and rhythm Neurological Exam: Alert and oriented to person, place and time.  Speech fluent and not dysarthric.  Language intact.  Left eye abducted on primary gaze, mildly reduced adduction of right eye.  Otherwise, CN II-XII intact.  Tone increased on left.  Muscle strength 4+/5 left upper extremity, 2+/5 left hip flexion, left foot drop/externally rotated, otherwise 5/5.  Sensation to pinprick intact; vibratory sensation reduced in both feet. Deep tendon reflexes 3+ in patellars bilaterally, otherwise 2+ throughout.  Left Babinski present.  Finger to nose past pointing.  Left hemiplegic/spastic gait.      Metta Clines, DO  CC: Sela Hilding, MD

## 2022-09-27 ENCOUNTER — Other Ambulatory Visit (INDEPENDENT_AMBULATORY_CARE_PROVIDER_SITE_OTHER): Payer: Medicare Other

## 2022-09-27 ENCOUNTER — Ambulatory Visit (INDEPENDENT_AMBULATORY_CARE_PROVIDER_SITE_OTHER): Payer: Medicare Other | Admitting: Neurology

## 2022-09-27 ENCOUNTER — Encounter: Payer: Self-pay | Admitting: Neurology

## 2022-09-27 VITALS — BP 142/80 | HR 95 | Ht 64.0 in | Wt 154.2 lb

## 2022-09-27 DIAGNOSIS — G35 Multiple sclerosis: Secondary | ICD-10-CM | POA: Diagnosis not present

## 2022-09-27 DIAGNOSIS — E559 Vitamin D deficiency, unspecified: Secondary | ICD-10-CM

## 2022-09-27 DIAGNOSIS — G35D Multiple sclerosis, unspecified: Secondary | ICD-10-CM

## 2022-09-27 LAB — CBC
HCT: 41.8 % (ref 36.0–46.0)
Hemoglobin: 13.4 g/dL (ref 12.0–15.0)
MCHC: 32 g/dL (ref 30.0–36.0)
MCV: 83.4 fl (ref 78.0–100.0)
Platelets: 225 10*3/uL (ref 150.0–400.0)
RBC: 5.01 Mil/uL (ref 3.87–5.11)
RDW: 14 % (ref 11.5–15.5)
WBC: 7.6 10*3/uL (ref 4.0–10.5)

## 2022-09-27 LAB — VITAMIN D 25 HYDROXY (VIT D DEFICIENCY, FRACTURES): VITD: 93.88 ng/mL (ref 30.00–100.00)

## 2022-09-27 LAB — COMPREHENSIVE METABOLIC PANEL
ALT: 9 U/L (ref 0–35)
AST: 19 U/L (ref 0–37)
Albumin: 4.4 g/dL (ref 3.5–5.2)
Alkaline Phosphatase: 51 U/L (ref 39–117)
BUN: 21 mg/dL (ref 6–23)
CO2: 24 mEq/L (ref 19–32)
Calcium: 10 mg/dL (ref 8.4–10.5)
Chloride: 105 mEq/L (ref 96–112)
Creatinine, Ser: 1.52 mg/dL — ABNORMAL HIGH (ref 0.40–1.20)
GFR: 34.58 mL/min — ABNORMAL LOW (ref >60.00)
Glucose, Bld: 104 mg/dL — ABNORMAL HIGH (ref 70–99)
Potassium: 4.8 mEq/L (ref 3.5–5.1)
Sodium: 138 mEq/L (ref 135–145)
Total Bilirubin: 0.5 mg/dL (ref 0.2–1.2)
Total Protein: 8.7 g/dL — ABNORMAL HIGH (ref 6.0–8.3)

## 2022-09-27 MED ORDER — DIAZEPAM 5 MG PO TABS: ORAL_TABLET | ORAL | 0 refills | Status: DC

## 2022-09-27 MED ORDER — LAMOTRIGINE 25 MG PO TABS: ORAL_TABLET | ORAL | 0 refills | Status: DC

## 2022-09-27 NOTE — Patient Instructions (Signed)
Restart lamotrigine 39m - take 1 tablet daily for 2 weeks, then 1 tablet twice daily for 2 weeks, then 2 tablets twice daily.  Contact me for refills Check CBC, CMP and vit D Check MRI of brain and cervical spine with and without contrast.  Sent in script for diazepam (Valium) Follow up in one year or sooner if needed.

## 2022-10-07 DIAGNOSIS — B079 Viral wart, unspecified: Secondary | ICD-10-CM | POA: Diagnosis not present

## 2022-10-07 DIAGNOSIS — D485 Neoplasm of uncertain behavior of skin: Secondary | ICD-10-CM | POA: Diagnosis not present

## 2022-10-19 ENCOUNTER — Other Ambulatory Visit: Payer: Self-pay | Admitting: Neurology

## 2022-10-22 ENCOUNTER — Other Ambulatory Visit: Payer: Medicare Other

## 2022-11-10 ENCOUNTER — Other Ambulatory Visit: Payer: Medicare Other

## 2022-11-10 DIAGNOSIS — I129 Hypertensive chronic kidney disease with stage 1 through stage 4 chronic kidney disease, or unspecified chronic kidney disease: Secondary | ICD-10-CM | POA: Diagnosis not present

## 2022-11-10 DIAGNOSIS — N390 Urinary tract infection, site not specified: Secondary | ICD-10-CM | POA: Diagnosis not present

## 2022-11-10 DIAGNOSIS — N1832 Chronic kidney disease, stage 3b: Secondary | ICD-10-CM | POA: Diagnosis not present

## 2022-11-13 ENCOUNTER — Ambulatory Visit
Admission: RE | Admit: 2022-11-13 | Discharge: 2022-11-13 | Disposition: A | Discharge status: Home / Self Care | Payer: Medicare Other | Source: Ambulatory Visit | Attending: Neurology | Admitting: Neurology

## 2022-11-13 DIAGNOSIS — G35 Multiple sclerosis: Secondary | ICD-10-CM

## 2022-11-13 MED ORDER — GADOPICLENOL 0.5 MMOL/ML IV SOLN
7.0000 mL | Freq: Once | INTRAVENOUS | Status: AC | PRN
  Administered 2022-11-13: 7 mL via INTRAVENOUS

## 2022-12-03 ENCOUNTER — Encounter
Payer: Medicare Other | Attending: Physical Medicine and Rehabilitation | Admitting: Physical Medicine and Rehabilitation

## 2022-12-03 DIAGNOSIS — Z79891 Long term (current) use of opiate analgesic: Secondary | ICD-10-CM | POA: Insufficient documentation

## 2022-12-03 DIAGNOSIS — G894 Chronic pain syndrome: Secondary | ICD-10-CM | POA: Insufficient documentation

## 2022-12-03 DIAGNOSIS — Z5181 Encounter for therapeutic drug level monitoring: Secondary | ICD-10-CM | POA: Insufficient documentation

## 2023-01-14 ENCOUNTER — Ambulatory Visit (INDEPENDENT_AMBULATORY_CARE_PROVIDER_SITE_OTHER): Payer: Medicare Other | Admitting: Nurse Practitioner

## 2023-01-14 ENCOUNTER — Encounter: Payer: Self-pay | Admitting: Nurse Practitioner

## 2023-01-14 VITALS — BP 130/78 | HR 84 | Temp 98.2°F | Ht 65.0 in | Wt 142.4 lb

## 2023-01-14 DIAGNOSIS — M6281 Muscle weakness (generalized): Secondary | ICD-10-CM

## 2023-01-14 DIAGNOSIS — G35 Multiple sclerosis: Secondary | ICD-10-CM

## 2023-01-14 DIAGNOSIS — N183 Chronic kidney disease, stage 3 unspecified: Secondary | ICD-10-CM

## 2023-01-14 DIAGNOSIS — M792 Neuralgia and neuritis, unspecified: Secondary | ICD-10-CM | POA: Diagnosis not present

## 2023-01-14 DIAGNOSIS — J309 Allergic rhinitis, unspecified: Secondary | ICD-10-CM | POA: Diagnosis not present

## 2023-01-14 DIAGNOSIS — R269 Unspecified abnormalities of gait and mobility: Secondary | ICD-10-CM | POA: Diagnosis not present

## 2023-01-14 DIAGNOSIS — I10 Essential (primary) hypertension: Secondary | ICD-10-CM | POA: Diagnosis not present

## 2023-01-14 DIAGNOSIS — R569 Unspecified convulsions: Secondary | ICD-10-CM

## 2023-01-14 MED ORDER — FLUTICASONE PROPIONATE 50 MCG/ACT NA SUSP: 1.0000 | Freq: Every day | NASAL | 5 refills | Status: AC

## 2023-01-14 MED ORDER — CETIRIZINE HCL 10 MG PO TABS: 10.0000 mg | ORAL_TABLET | Freq: Every day | ORAL | 11 refills | Status: AC

## 2023-01-14 NOTE — Progress Notes (Unsigned)
Bethanie Dicker, NP-C Phone: [phone]  Selena Bender is a 72 y.o. female who presents today to establish care. She is followed closely by Neurology. She has been seeing Physical Medicine and Rehab for help with her neuropathic pain. She is requesting a referral to a pain management specialist and for new orders to restart physical therapy. She was doing PT last year for generalized weakness due to her MS and found it very helpful.   Multiple Sclerosis- Diagnosed in 2005. Managed by Neurology. Using a cane to help with ambulation. She has difficulty with her balance and gait. She has increased neuropathic pain in her legs.   HYPERTENSION Disease Monitoring Home BP Monitoring- 130/70s Chest pain- No    Dyspnea- No Medications Compliance-  Losartan. Lightheadedness-  No  Edema- No BMET    Component Value Date/Time   NA 138 09/27/2022 1142   NA 138 09/03/2020 1111   K 4.8 09/27/2022 1142   CL 105 09/27/2022 1142   CO2 24 09/27/2022 1142   GLUCOSE 104 (H) 09/27/2022 1142   BUN 21 09/27/2022 1142   BUN 24 09/03/2020 1111   CREATININE 1.52 (H) 09/27/2022 1142   CREATININE 1.50 (H) 04/06/2021 0934   CALCIUM 10.0 09/27/2022 1142   GFRNONAA 38 (L) 04/06/2021 0934   GFRAA 31 (L) 09/03/2020 1111    Active Ambulatory Problems    Diagnosis Date Noted   Abnormal gait 09/15/2018   Malignant neoplasm of upper-outer quadrant of right breast in female, estrogen receptor positive (HCC) 11/01/2012   Claustrophobia 09/15/2018   Hypertension 09/15/2018   Localization-related epilepsy (HCC) 09/15/2018   Multiple sclerosis (HCC) 09/15/2018   Neuropathic pain 06/02/2015   No advance directives 11/02/2012   Pain in lower limb 12/31/2015   Paresthesia 09/15/2018   Refusal of blood transfusion for reasons of conscience 11/02/2012   Seizure (HCC) 12/31/2015   History of right breast cancer 03/13/2020   Status post right mastectomy 03/13/2020   COVID-19 virus infection 12/2020   CKD (chronic  kidney disease), stage III (HCC)    Allergic rhinitis 01/14/2023   Resolved Ambulatory Problems    Diagnosis Date Noted   No Resolved Ambulatory Problems   Past Medical History:  Diagnosis Date   Asthma    Breast cancer (HCC) 2014   Chicken pox    Seizures (HCC)     Family History  Problem Relation Age of Onset   Sickle cell anemia Father    Breast cancer Neg Hx     Social History   Socioeconomic History   Marital status: Widowed    Spouse name: Not on file   Number of children: 2   Years of education: Not on file   Highest education level: Not on file  Occupational History   Not on file  Tobacco Use   Smoking status: Former    Types: Cigarettes   Smokeless tobacco: Never  Vaping Use   Vaping Use: Never used  Substance and Sexual Activity   Alcohol use: Yes    Comment: occ   Drug use: Not Currently    Types: Marijuana    Comment: Gummies   Sexual activity: Not Currently  Other Topics Concern   Not on file  Social History Narrative   Not on file   Social Determinants of Health   Financial Resource Strain: Not on file  Food Insecurity: Not on file  Transportation Needs: Not on file  Physical Activity: Not on file  Stress: Not on file  Social Connections: Not  on file  Intimate Partner Violence: Not on file    ROS  General:  Negative for unexplained weight loss, fever Skin: Negative for new or changing mole, sore that won't heal HEENT: Negative for trouble hearing, trouble seeing, ringing in ears, mouth sores, hoarseness, change in voice, dysphagia. CV:  Negative for chest pain, dyspnea, edema, palpitations Resp: Negative for cough, dyspnea, hemoptysis GI: Negative for nausea, vomiting, diarrhea, constipation, abdominal pain, melena, hematochezia. GU: Negative for dysuria, incontinence, urinary hesitance, hematuria, vaginal or penile discharge, polyuria, sexual difficulty, lumps in testicle or breasts MSK: Negative for joint pain or swelling Neuro:  Negative for headaches, numbness, dizziness, passing out/fainting Psych: Negative for anxiety, memory problems  Objective  Physical Exam Vitals:   01/14/23 1315  BP: 130/78  Pulse: 84  Temp: 98.2 F (36.8 C)  SpO2: 97%    BP Readings from Last 3 Encounters:  01/14/23 130/78  09/28/22 (!) 142/80  09/03/22 137/83   Wt Readings from Last 3 Encounters:  01/14/23 142 lb 6.4 oz (64.6 kg)  09/28/22 154 lb 3.2 oz (69.9 kg)  09/03/22 152 lb (68.9 kg)    Physical Exam Constitutional:      General: She is not in acute distress.    Appearance: Normal appearance.  HENT:     Head: Normocephalic.  Cardiovascular:     Rate and Rhythm: Normal rate and regular rhythm.     Heart sounds: Normal heart sounds.  Pulmonary:     Effort: Pulmonary effort is normal.     Breath sounds: Normal breath sounds.  Skin:    General: Skin is warm and dry.  Neurological:     Mental Status: She is alert.     Motor: Weakness present.     Gait: Gait abnormal.  Psychiatric:        Mood and Affect: Mood normal.        Behavior: Behavior normal.    Assessment/Plan:   Primary hypertension Assessment & Plan: Chronic. Stable on Losartan 50 mg daily. Continue.    Multiple sclerosis (HCC) Assessment & Plan: Will refer to Pain Management for help managing increased pain. Will also refer to PT for patient to restart for generalized weakness. Encouraged follow up with Neurology as scheduled.   Orders: -     Ambulatory referral to Pain Clinic -     Ambulatory referral to Physical Therapy  Seizure [facility]) Assessment & Plan: Stable on Lamictal. Continue. Follow up with Neurology.    Allergic rhinitis, unspecified seasonality, unspecified trigger Assessment & Plan: Increased allergy symptoms. Does not feel that Claritin is helping. Will discontinue Claritin and start Zyrtec 10 mg daily. Encouraged to continue Flonase daily. Advised adequate fluid intake. Encouraged to contact if symptoms are worsening  or changing.   Orders: -     Fluticasone Propionate; Place 1 spray into both nostrils at bedtime.  Dispense: 16 g; Refill: 5 -     Cetirizine HCl; Take 1 tablet (10 mg total) by mouth daily.  Dispense: 30 tablet; Refill: 11  Neuropathic pain Assessment & Plan: Mostly in legs. Likely due to MS. She has been seeing Physical Medicine and Rehab without relief. Tramadol not helping. She did not start taking the Methadone prescribed at her last visit due to reading about the side effects. She would like to see another specialist that can help with her pain. Will refer to Pain Management for evaluation.   Orders: -     Ambulatory referral to Pain Clinic  Abnormal gait -  Ambulatory referral to Physical Therapy  Generalized muscle weakness -     Ambulatory referral to Physical Therapy   Return in about 6 months (around 07/16/2023) for Follow up.   Bethanie Dicker, NP-C [facility] [employer]

## 2023-01-19 NOTE — Assessment & Plan Note (Signed)
Increased allergy symptoms. Does not feel that Claritin is helping. Will discontinue Claritin and start Zyrtec 10 mg daily. Encouraged to continue Flonase daily. Advised adequate fluid intake. Encouraged to contact if symptoms are worsening or changing.

## 2023-01-19 NOTE — Assessment & Plan Note (Signed)
Chronic. Stable on Losartan 50 mg daily. Continue.

## 2023-01-19 NOTE — Assessment & Plan Note (Signed)
Stable on Lamictal. Continue. Follow up with Neurology.

## 2023-01-19 NOTE — Assessment & Plan Note (Signed)
Mostly in legs. Likely due to MS. She has been seeing Physical Medicine and Rehab without relief. Tramadol not helping. She did not start taking the Methadone prescribed at her last visit due to reading about the side effects. She would like to see another specialist that can help with her pain. Will refer to Pain Management for evaluation.

## 2023-01-19 NOTE — Assessment & Plan Note (Addendum)
Will refer to Pain Management for help managing increased pain. Will also refer to PT for patient to restart for generalized weakness. Encouraged follow up with Neurology as scheduled.

## 2023-01-20 ENCOUNTER — Other Ambulatory Visit: Payer: Self-pay | Admitting: Physical Medicine and Rehabilitation

## 2023-01-24 ENCOUNTER — Telehealth: Payer: Self-pay

## 2023-01-24 NOTE — Telephone Encounter (Signed)
Patient request a refill of Tramadol.  [facility] [address]   Filled  Written  ID  Drug  QTY  Days  Prescriber  RX #  Dispenser  Refill  Daily Dose*  Pymt Type  PMP  12/04/2022 06/10/2022 1  Tramadol Hcl 50 Mg Tablet 120.00 30 Me Lov [id number] Nor ([id number]) 3/5 40.00 MME Comm Ins [place] 10/13/2022 06/10/2022 1  Tramadol Hcl 50 Mg Tablet 120.00 30 Me Lov [id number] Nor ([id number]) 2/5 40.00 MME Comm Ins [place] 09/27/2022 09/27/2022 1  Diazepam 5 Mg Tablet 2.00 1 Ad Jaf [id number] Nor ([id number]) 0/0 1.00 LME Comm Ins [place]

## 2023-01-25 MED ORDER — TRAMADOL HCL 50 MG PO TABS: 50.0000 mg | ORAL_TABLET | Freq: Four times a day (QID) | ORAL | 5 refills | Status: DC | PRN

## 2023-01-25 NOTE — Addendum Note (Signed)
Addended by: Genice Rouge on: 01/25/2023 10:08 AM   Modules accepted: Orders

## 2023-01-26 ENCOUNTER — Encounter: Payer: Self-pay | Admitting: Physical Medicine and Rehabilitation

## 2023-01-26 ENCOUNTER — Encounter
Payer: Medicare Other | Attending: Physical Medicine and Rehabilitation | Admitting: Physical Medicine and Rehabilitation

## 2023-01-26 VITALS — BP 141/85 | HR 95 | Ht 65.0 in | Wt 142.0 lb

## 2023-01-26 DIAGNOSIS — G35 Multiple sclerosis: Secondary | ICD-10-CM | POA: Diagnosis not present

## 2023-01-26 DIAGNOSIS — R202 Paresthesia of skin: Secondary | ICD-10-CM | POA: Insufficient documentation

## 2023-01-26 DIAGNOSIS — M792 Neuralgia and neuritis, unspecified: Secondary | ICD-10-CM | POA: Insufficient documentation

## 2023-01-26 MED ORDER — HYDROCODONE-ACETAMINOPHEN 5-325 MG PO TABS: 1.0000 | ORAL_TABLET | Freq: Four times a day (QID) | ORAL | 0 refills | Status: AC | PRN

## 2023-01-26 NOTE — Patient Instructions (Signed)
Pt is a 72 yr old R handed female with hx of relapsing remitting MS and breast CA and seizures here for evaluation of chronic pain in B/L arms and chest due to mastectomy- dx'd 2014- and in remission.  A lot of nerve pain, likely from MS.  Here for f/u on chronic pain/MS.    Doesn't want to try methadone because of research as well as stigma of the medicine.   2. Since insurance is going to require we try other meds- to get to Belbuca- (failed gabapentin and Duloxetine) will start with Norco 5/325 mg- 1 tab q6 hours as needed- max 4 tabs/day.  Can cause sedation- and constipation- all opiates can.    3. Has been having her take Vit B complex- she will pee out what she doesn't need.    4. Can try Folic acid- to help nerve pain-  check to see if come in gummies. I also agree with Vit B complex- again see if can get in gummies.    5.  [place] regulates their supplements as medications- so can try to look for [place] brands. Since they are regulated.    6. Would prefer that you take ONLY Norco/Hydrocodone without tramadol - if it's not effective, then can go back to Tramadol.    7. Trying to see if can restart PT- if needs an order, call me   8. Filled out paperwork for handicapped placard- suggestion get placards not plates.    9. F/U in 3- months

## 2023-01-26 NOTE — Progress Notes (Signed)
Subjective:    Patient ID: Selena Bender, female    DOB: 05-09-1951, 72 y.o.   MRN: [id number]  HPI  Pt is a 72 yr old R handed female with hx of relapsing remitting MS and breast CA and seizures here for evaluation of chronic pain in B/L arms and chest due to mastectomy- dx'd 2014- and in remission.  A lot of nerve pain, likely from MS.  Here for f/u on chronic pain/MS.   Didn't get Methadone- decided against using.   Worked for [employer] for years- and many [facility] patients got on methadone due to addiction issues.    Pain about the same- sometimes getting worse.   Some rough days, couldn't get out of bed, because was in so much pain-   Every day has pain- but these few days, pain was just terrible-  Today is not a bad day- not excruciating those days.   More than 10 days in last month, that pain is worse.   Has nerve/pain every day.   Taking Tramadol- daily- 2-3 pills/day-    Things seem to ease off after takes a dose of Tramadol.    Pain Inventory Average Pain 10 Pain Right Now 7 My pain is constant, sharp, and burning  In the last 24 hours, has pain interfered with the following? General activity 7 Relation with others 8 Enjoyment of life 10 What TIME of day is your pain at its worst? daytime Sleep (in general) Fair  Pain is worse with: walking and inactivity Pain improves with: therapy/exercise and medication Relief from Meds: 3  Family History  Problem Relation Age of Onset   Sickle cell anemia Father    Breast cancer Neg Hx    Social History   Socioeconomic History   Marital status: Widowed    Spouse name: Not on file   Number of children: 2   Years of education: Not on file   Highest education level: Not on file  Occupational History   Not on file  Tobacco Use   Smoking status: Former    Types: Cigarettes   Smokeless tobacco: Never  Vaping Use   Vaping Use: Never used  Substance and Sexual Activity   Alcohol use: Yes    Comment: occ   Drug  use: Not Currently    Types: Marijuana    Comment: Gummies   Sexual activity: Not Currently  Other Topics Concern   Not on file  Social History Narrative   Not on file   Social Determinants of Health   Financial Resource Strain: Not on file  Food Insecurity: Not on file  Transportation Needs: Not on file  Physical Activity: Not on file  Stress: Not on file  Social Connections: Not on file   Past Surgical History:  Procedure Laterality Date   BREAST SURGERY     MASTECTOMY Right 2014   Past Surgical History:  Procedure Laterality Date   BREAST SURGERY     MASTECTOMY Right 2014   Past Medical History:  Diagnosis Date   Asthma    Breast cancer (HCC) 2014   Right   Chicken pox    CKD (chronic kidney disease), stage III (HCC)    COVID-19 virus infection 12/2020   Hypertension    Multiple sclerosis (HCC)    Seizures (HCC)    There were no vitals taken for this visit.  Opioid Risk Score:   Fall Risk Score:  `1  Depression screen [facility] 2/9     01/14/2023  1:18 PM 09/03/2022   10:54 AM 06/02/2022   11:17 AM 02/05/2022    1:42 PM 10/28/2021   10:45 AM 09/03/2020   10:07 AM 06/02/2020    2:30 PM  Depression screen PHQ 2/9  Decreased Interest 2 0 0 1 2 0 0  Down, Depressed, Hopeless 2 0 0 1 2 0 0  PHQ - 2 Score 4 0 0 2 4 0 0  Altered sleeping 2    2    Tired, decreased energy 3    3    Change in appetite 3    0    Feeling bad or failure about yourself  0    0    Trouble concentrating 1    1    Moving slowly or fidgety/restless 0    2    Suicidal thoughts 0    0    PHQ-9 Score 13    12    Difficult doing work/chores Very difficult    Extremely dIfficult       Review of Systems  Musculoskeletal:        Left leg pain  All other systems reviewed and are negative.      Objective:   Physical Exam  Awake, alert, appropriate, dysconjugate gaze- chronic; using SBQC to walk; accompanied by daughter, NAD       Assessment & Plan:   Pt is a 72 yr old R handed  female with hx of relapsing remitting MS and breast CA and seizures here for evaluation of chronic pain in B/L arms and chest due to mastectomy- dx'd 2014- and in remission.  A lot of nerve pain, likely from MS.  Here for f/u on chronic pain/MS.    Doesn't want to try methadone because of research as well as stigma of the medicine.   2. Since insurance is going to require we try other meds- to get to [place]- (failed gabapentin and Duloxetine) will start with Norco 5/325 mg- 1 tab q6 hours as needed- max 4 tabs/day.  Can cause sedation- and constipation- all opiates can.    3. Has been having her take Vit B complex- she will pee out what she doesn't need.    4. Can try Folic acid- to help nerve pain-  check to see if come in gummies. I also agree with Vit B complex- again see if can get in gummies.    5.  [place] regulates their supplements as medications- so can try to look for [place] brands. Since they are regulated.    6. Would prefer that you take ONLY Norco/Hydrocodone without tramadol - if it's not effective, then can go back to Tramadol.    7. Trying to see if can restart PT- if needs an order, call me   8. Filled out paperwork for handicapped placard- suggestion get placards not plates.    9. F/U in 3- months    I spent a total of 32   minutes on total care today- >50% coordination of care- due to d/w pt and daughter about options for nerve pain-

## 2023-02-01 ENCOUNTER — Ambulatory Visit
Admission: RE | Admit: 2023-02-01 | Discharge: 2023-02-01 | Disposition: A | Discharge status: Home / Self Care | Payer: Medicare Other | Source: Ambulatory Visit | Attending: Pain Medicine | Admitting: Pain Medicine

## 2023-02-01 ENCOUNTER — Other Ambulatory Visit: Payer: Self-pay | Admitting: Pain Medicine

## 2023-02-01 ENCOUNTER — Encounter: Payer: Self-pay | Admitting: Pain Medicine

## 2023-02-01 ENCOUNTER — Ambulatory Visit (HOSPITAL_BASED_OUTPATIENT_CLINIC_OR_DEPARTMENT_OTHER): Payer: Medicare Other | Admitting: Pain Medicine

## 2023-02-01 VITALS — BP 156/90 | HR 91 | Temp 97.3°F | Ht 65.0 in | Wt 142.0 lb

## 2023-02-01 DIAGNOSIS — Z853 Personal history of malignant neoplasm of breast: Secondary | ICD-10-CM

## 2023-02-01 DIAGNOSIS — R937 Abnormal findings on diagnostic imaging of other parts of musculoskeletal system: Secondary | ICD-10-CM

## 2023-02-01 DIAGNOSIS — M79602 Pain in left arm: Secondary | ICD-10-CM | POA: Insufficient documentation

## 2023-02-01 DIAGNOSIS — M25552 Pain in left hip: Secondary | ICD-10-CM | POA: Insufficient documentation

## 2023-02-01 DIAGNOSIS — G8929 Other chronic pain: Secondary | ICD-10-CM | POA: Insufficient documentation

## 2023-02-01 DIAGNOSIS — G894 Chronic pain syndrome: Secondary | ICD-10-CM

## 2023-02-01 DIAGNOSIS — G35 Multiple sclerosis: Secondary | ICD-10-CM

## 2023-02-01 DIAGNOSIS — Z79899 Other long term (current) drug therapy: Secondary | ICD-10-CM

## 2023-02-01 DIAGNOSIS — M5136 Other intervertebral disc degeneration, lumbar region: Secondary | ICD-10-CM | POA: Diagnosis not present

## 2023-02-01 DIAGNOSIS — M47816 Spondylosis without myelopathy or radiculopathy, lumbar region: Secondary | ICD-10-CM | POA: Diagnosis not present

## 2023-02-01 DIAGNOSIS — M545 Low back pain, unspecified: Secondary | ICD-10-CM | POA: Diagnosis not present

## 2023-02-01 DIAGNOSIS — M5441 Lumbago with sciatica, right side: Secondary | ICD-10-CM | POA: Insufficient documentation

## 2023-02-01 DIAGNOSIS — Z79891 Long term (current) use of opiate analgesic: Secondary | ICD-10-CM | POA: Diagnosis not present

## 2023-02-01 DIAGNOSIS — G379 Demyelinating disease of central nervous system, unspecified: Secondary | ICD-10-CM

## 2023-02-01 DIAGNOSIS — M899 Disorder of bone, unspecified: Secondary | ICD-10-CM

## 2023-02-01 DIAGNOSIS — R52 Pain, unspecified: Secondary | ICD-10-CM

## 2023-02-01 DIAGNOSIS — M79601 Pain in right arm: Secondary | ICD-10-CM | POA: Insufficient documentation

## 2023-02-01 DIAGNOSIS — M5410 Radiculopathy, site unspecified: Secondary | ICD-10-CM | POA: Insufficient documentation

## 2023-02-01 DIAGNOSIS — M5442 Lumbago with sciatica, left side: Secondary | ICD-10-CM | POA: Insufficient documentation

## 2023-02-01 DIAGNOSIS — G959 Disease of spinal cord, unspecified: Secondary | ICD-10-CM | POA: Insufficient documentation

## 2023-02-01 DIAGNOSIS — M25551 Pain in right hip: Secondary | ICD-10-CM | POA: Insufficient documentation

## 2023-02-01 DIAGNOSIS — Z789 Other specified health status: Secondary | ICD-10-CM

## 2023-02-01 DIAGNOSIS — M160 Bilateral primary osteoarthritis of hip: Secondary | ICD-10-CM | POA: Diagnosis not present

## 2023-02-01 NOTE — Patient Instructions (Signed)
____________________________________________________________________________________________  New Patients  Welcome to [facility] [facility].   Initial Visit The first or initial visit consists of an evaluation only.   Interventional pain management.  We offer therapies other than opioid controlled substances to manage chronic pain. These include, but are not limited to, diagnostic, therapeutic, and palliative specialized injection therapies (i.e.: Epidural Steroids, Facet Blocks, etc.). We specialize in a variety of nerve blocks as well as radiofrequency treatments. We offer pain implant evaluations and trials, as well as follow up management. In addition we also provide a variety joint injections, including Viscosupplementation (AKA: Gel Therapy).  Prescription Pain Medication. We specialize in alternatives to opioids. We can provide evaluations and recommendations for/of pharmacologic therapies based on CDC Guidelines.  We no longer take patients for long-term medication management. We will not be taking over your pain medications.  ____________________________________________________________________________________________    ____________________________________________________________________________________________  Patient Information update  To: All of our patients.  Re: Name change.  It has been made official that our current name, "[facility]"   will soon be changed to "[facility]".   The purpose of this change is to eliminate any confusion created by the concept of our practice being a "Medication Management Pain Clinic". In the past this has led to the misconception that we treat pain primarily by the use of prescription medications.  Nothing can be farther from the truth.   Understanding PAIN MANAGEMENT: To  further understand what our practice does, you first have to understand that "Pain Management" is a subspecialty that requires additional training once a physician has completed their specialty training, which can be in either Anesthesia, Neurology, Psychiatry, or Physical Medicine and Rehabilitation (PMR). Each one of these contributes to the final approach taken by each physician to the management of their patient's pain. To be a "Pain Management Specialist" you must have first completed one of the specialty trainings below.  Anesthesiologists - trained in clinical pharmacology and interventional techniques such as nerve blockade and regional as well as central neuroanatomy. They are trained to block pain before, during, and after surgical interventions.  Neurologists - trained in the diagnosis and pharmacological treatment of complex neurological conditions, such as Multiple Sclerosis, Parkinson's, spinal cord injuries, and other systemic conditions that may be associated with symptoms that may include but are not limited to pain. They tend to rely primarily on the treatment of chronic pain using prescription medications.  Psychiatrist - trained in conditions affecting the psychosocial wellbeing of patients including but not limited to depression, anxiety, schizophrenia, personality disorders, addiction, and other substance use disorders that may be associated with chronic pain. They tend to rely primarily on the treatment of chronic pain using prescription medications.   Physical Medicine and Rehabilitation (PMR) physicians, also known as physiatrists - trained to treat a wide variety of medical conditions affecting the brain, spinal cord, nerves, bones, joints, ligaments, muscles, and tendons. Their training is primarily aimed at treating patients that have suffered injuries that have caused severe physical impairment. Their training is primarily aimed at the physical therapy and rehabilitation of those  patients. They may also work alongside orthopedic surgeons or neurosurgeons using their expertise in assisting surgical patients to recover after their surgeries.  INTERVENTIONAL PAIN MANAGEMENT is sub-subspecialty of Pain Management.  Our physicians are Board-certified in Anesthesia, Pain Management, and Interventional Pain Management.  This meaning that not only have they been trained   and Board-certified in their specialty of Anesthesia, and subspecialty of Pain Management, but they have also received further training in the sub-subspecialty of Interventional Pain Management, in order to become Board-certified as INTERVENTIONAL PAIN MANAGEMENT SPECIALIST.    Mission: Our goal is to use our skills in  INTERVENTIONAL PAIN MANAGEMENT as alternatives to the chronic use of prescription opioid medications for the treatment of pain. To make this more clear, we have changed our name to reflect what we do and offer. We will continue to offer medication management assessment and recommendations, but we will not be taking over any patient's medication management.  ____________________________________________________________________________________________     

## 2023-02-01 NOTE — Progress Notes (Signed)
Safety precautions to be maintained throughout the outpatient stay will include: orient to surroundings, keep bed in low position, maintain call bell within reach at all times, provide assistance with transfer out of bed and ambulation.  

## 2023-02-01 NOTE — Progress Notes (Signed)
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 encounter: 5\' 5"  (1.651 m).   Weight as of this encounter: 142 lb (64.4 kg).  BMI interpretation table: BMI level Category Range association with higher incidence of chronic pain  <18 kg/m2 Underweight   18.5-24.9 kg/m2 Ideal body weight   25-29.9 kg/m2 Overweight Increased incidence by 20%  30-34.9 kg/m2 Obese (Class I) Increased incidence by 68%  35-39.9 kg/m2 Severe obesity (Class II) Increased incidence by 136%  >40 kg/m2 Extreme obesity (Class III) Increased incidence by 254%   Patient's current BMI Ideal Body weight  Body mass index is 23.63 kg/m. Ideal body weight: 57 kg (125 lb 10.6 oz) Adjusted ideal body weight: 60 kg (132 lb 3.2 oz)   BMI Readings from Last 4 Encounters:  02/01/23 23.63 kg/m  01/26/23 23.63 kg/m  01/14/23 23.70 kg/m  09/28/22 26.47 kg/m   Wt Readings from Last 4 Encounters:  02/01/23 142 lb (64.4  kg)  01/26/23 142 lb (64.4 kg)  01/14/23 142 lb 6.4 oz (64.6 kg)  09/28/22 154 lb 3.2 oz (69.9 kg)    Psych/Mental status: Alert,  oriented x 3 (person, place, & time)       Eyes: PERLA Respiratory: No evidence of acute respiratory distress  Assessment  Primary Diagnosis & Pertinent Problem List: The primary encounter diagnosis was Chronic pain syndrome. Diagnoses of Pharmacologic therapy, Disorder of skeletal system, Problems influencing health status, Chronic use of opiate for therapeutic purpose, Multiple sclerosis (HCC), Demyelinating disease of central nervous system (HCC), Abnormal MRI, cervical spine (06/06/2020 & 11/13/2022), Chronic radicular pain of lower extremity (S1) (Bilateral), Lower extremity burning pain, Chronic low back pain (2ry area of Pain) (Bilateral) (R>L) w/ sciatica (Bilateral), Chronic hip pain (Bilateral) (L>R), History of right breast cancer, and Chronic upper extremity pain (intermittent) (Bilateral) were also pertinent to this visit.  Visit Diagnosis (New problems to examiner): 1. Chronic pain syndrome   2. Pharmacologic therapy   3. Disorder of skeletal system   4. Problems influencing health status   5. Chronic use of opiate for therapeutic purpose   6. Multiple sclerosis (HCC)   7. Demyelinating disease of central nervous system (HCC)   8. Abnormal MRI, cervical spine (06/06/2020 & 11/13/2022)   9. Chronic radicular pain of lower extremity (S1) (Bilateral)   10. Lower extremity burning pain   11. Chronic low back pain (2ry area of Pain) (Bilateral) (R>L) w/ sciatica (Bilateral)   12. Chronic hip pain (Bilateral) (L>R)   13. History of right breast cancer   14. Chronic upper extremity pain (intermittent) (Bilateral)    Plan of Care (Initial workup plan)  Note: Ms. Sahvannah Rieser was reminded that as per protocol, today's visit has been an evaluation only. We have not taken over the patient's controlled substance management.  Problem-specific plan: No  problem-specific Assessment & Plan notes found for this encounter.  Lab Orders         Compliance Drug Analysis, Ur         Magnesium         Sedimentation rate         C-reactive protein     Imaging Orders         DG Lumbar Spine Complete W/Bend         DG HIP UNILAT W OR W/O PELVIS 2-3 VIEWS RIGHT         DG HIP UNILAT W OR W/O PELVIS 2-3 VIEWS LEFT     Referral Orders  No referral(s) requested today   Procedure Orders    No procedure(s) ordered today   Pharmacotherapy (current): Medications ordered:  No orders of the defined types were placed in this encounter.  Medications administered during this visit: Jaskiran Collymore Bender had no medications administered during this visit.   Analgesic Pharmacotherapy:  Opioid Analgesics: For patients currently taking or requesting to take opioid analgesics, in accordance with [facility] Guidelines, we will assess their risks and indications for the use of these substances. After completing our evaluation, we may offer recommendations, but we no longer take patients for medication management. The prescribing physician will ultimately decide, based on his/her training and level of comfort whether to adopt any of the recommendations, including whether or not to prescribe such medicines.  Membrane stabilizer: To be determined at a later time  Muscle relaxant: To be determined at a later time  NSAID: To be determined at a later time  Other analgesic(s): To be determined at a later time   Interventional management options: Ms. Myisha Pickerel was informed that there is no guarantee that she would be a candidate for interventional  therapies. The decision will be based on the results of diagnostic studies, as well as Ms. Collymore Bender's risk profile.  Procedure(s) under consideration:  Pending results of ordered studies      Interventional Therapies  Risk Factors  Considerations:  MS  CKD  Seizure disorder (focal epilepsy)   HTN  claustrophobia  Hx Right Breast CA (Hx Mastectomy)     Planned  Pending:      Under consideration:   Pending   Completed:   None at this time   Therapeutic  Palliative (PRN) options:   None established   Completed by other providers:   None reported      Provider-requested follow-up: Return in about 2 weeks (around 02/15/2023) for ( ), Eval-day (M,W), (F2F), 2nd Visit, for review of ordered tests.  Future Appointments  Date Time Provider Department Center  02/03/2023  1:15 PM Peter Congo, PT [facility]  03/02/2023 10:00 AM Delano Metz, MD [facility] None  04/25/2023 11:00 AM Genice Rouge, MD [facility]  07/19/2023  1:00 PM Bethanie Dicker, NP [facility]  09/28/2023 10:30 AM Drema Dallas, DO [facility] None    Duration of encounter: 67 minutes.  Total time on encounter, as per AMA guidelines included both the face-to-face and non-face-to-face time personally spent by the physician and/or other qualified health care professional(s) on the day of the encounter (includes time in activities that require the physician or other qualified health care professional and does not include time in activities normally performed by clinical staff). Physician's time may include the following activities when performed: Preparing to see the patient (e.g., pre-charting review of records, searching for previously ordered imaging, lab work, and nerve conduction tests) Review of prior analgesic pharmacotherapies. Reviewing PMP Interpreting ordered tests (e.g., lab work, imaging, nerve conduction tests) Performing post-procedure evaluations, including interpretation of diagnostic procedures Obtaining and/or reviewing separately obtained history Performing a medically appropriate examination and/or evaluation Counseling and educating the patient/family/caregiver Ordering medications, tests, or procedures Referring and communicating with other health care professionals (when  not separately reported) Documenting clinical information in the electronic or other health record Independently interpreting results (not separately reported) and communicating results to the patient/ family/caregiver Care coordination (not separately reported)  Note by: Oswaldo Done, MD (TTS technology used. I apologize for any typographical errors that were not detected and corrected.) Date: 02/01/2023; Time: 1:50 PM

## 2023-02-02 LAB — C-REACTIVE PROTEIN: CRP: <1 mg/L (ref 0–10)

## 2023-02-02 LAB — SEDIMENTATION RATE: Sed Rate: 31 mm/hr (ref 0–40)

## 2023-02-02 LAB — MAGNESIUM: Magnesium: 2.1 mg/dL (ref 1.6–2.3)

## 2023-02-03 ENCOUNTER — Ambulatory Visit: Payer: Medicare Other | Attending: Nurse Practitioner | Admitting: Physical Therapy

## 2023-02-03 ENCOUNTER — Telehealth: Payer: Self-pay | Admitting: Physical Therapy

## 2023-02-03 DIAGNOSIS — R269 Unspecified abnormalities of gait and mobility: Secondary | ICD-10-CM | POA: Insufficient documentation

## 2023-02-03 DIAGNOSIS — M25562 Pain in left knee: Secondary | ICD-10-CM | POA: Diagnosis not present

## 2023-02-03 DIAGNOSIS — G35 Multiple sclerosis: Secondary | ICD-10-CM | POA: Insufficient documentation

## 2023-02-03 DIAGNOSIS — R2689 Other abnormalities of gait and mobility: Secondary | ICD-10-CM

## 2023-02-03 DIAGNOSIS — M6281 Muscle weakness (generalized): Secondary | ICD-10-CM | POA: Diagnosis not present

## 2023-02-03 DIAGNOSIS — R2681 Unsteadiness on feet: Secondary | ICD-10-CM

## 2023-02-03 DIAGNOSIS — R293 Abnormal posture: Secondary | ICD-10-CM

## 2023-02-03 DIAGNOSIS — G8929 Other chronic pain: Secondary | ICD-10-CM

## 2023-02-03 NOTE — Therapy (Signed)
OUTPATIENT PHYSICAL THERAPY NEURO EVALUATION   Patient Name: Selena Bender MRN: [id number] DOB:Dec 21, 1950, 72 y.o., female Today's Date: 02/03/2023   PCP: Bethanie Dicker, NP REFERRING PROVIDER: Bethanie Dicker, NP  END OF SESSION:  PT End of Session - 02/03/23 1321     Visit Number 1    Number of Visits 17   with eval   Date for PT Re-Evaluation 04/28/23    Authorization Type Medicare    Progress Note Due on Visit 10    PT Start Time 1320   pt arrived late   PT Stop Time 1400    PT Time Calculation (min) 40 min    Activity Tolerance Patient tolerated treatment well    Behavior During Therapy [facility] for tasks assessed/performed             Past Medical History:  Diagnosis Date   Asthma    Breast cancer (HCC) 2014   Right   Chicken pox    CKD (chronic kidney disease), stage III (HCC)    COVID-19 virus infection 12/2020   Hypertension    Multiple sclerosis (HCC)    Seizures (HCC)    Past Surgical History:  Procedure Laterality Date   BREAST SURGERY     MASTECTOMY Right 2014   Patient Active Problem List   Diagnosis Date Noted   Spinal cord disease (HCC) 02/01/2023   Abnormal MRI, cervical spine (06/06/2020 & 11/13/2022) 02/01/2023   Demyelinating disease of central nervous system (HCC) 02/01/2023   Chronic radicular pain of lower extremity (S1) (Bilateral) 02/01/2023   Lower extremity burning pain 02/01/2023   Chronic low back pain (2ry area of Pain) (Bilateral) (R>L) w/ sciatica (Bilateral) 02/01/2023   Chronic hip pain (Bilateral) (L>R) 02/01/2023   Chronic upper extremity pain (intermittent) (Bilateral) 02/01/2023   Allergic rhinitis 01/14/2023   CKD (chronic kidney disease), stage III (HCC)    COVID-19 virus infection 12/2020   Myelopathy (HCC) 09/25/2020   History of right breast cancer 03/13/2020   Status post right mastectomy 03/13/2020   Abnormal gait 09/15/2018   Claustrophobia 09/15/2018   Hypertension 09/15/2018   Focal epilepsy (HCC)  09/15/2018   Multiple sclerosis (HCC) 09/15/2018   Paresthesia 09/15/2018   Chronic lower extremity pain (1ry area of Pain) (Bilateral) (R>L) 12/31/2015   Seizure (HCC) 12/31/2015   Neuropathic pain 06/02/2015   No advance directives 11/02/2012   Refusal of blood transfusion for reasons of conscience 11/02/2012   Malignant neoplasm of upper-outer quadrant of right breast in female, estrogen receptor positive (HCC) 11/01/2012    ONSET DATE: 01/14/2023 (referral date)  REFERRING DIAG: G35 (ICD-10-CM) - Multiple sclerosis (HCC) R26.9 (ICD-10-CM) - Abnormal gait M62.81 (ICD-10-CM) - Generalized muscle weakness  THERAPY DIAG:  Unsteadiness on feet  Other abnormalities of gait and mobility  Muscle weakness (generalized)  Abnormal posture  Chronic pain of left knee  Rationale for Evaluation and Treatment: Rehabilitation  SUBJECTIVE:  SUBJECTIVE STATEMENT: Pt enters clinic with her [facility], reports she does use her RW mostly at home. Pt reports that she has been experiencing increased pain over the past year, it has been getting worse and she is working with several different physicians for pain management.  Pt did have some xrays done yesterday for her hips and lumbar spine, still waiting on results. Pt reports she has ongoing swelling in her L knee and her L ankle, has a history of arthroscopic surgery in her L ankle.  Pt accompanied by: self and family member daughter [name]  PERTINENT HISTORY: relapsing remitting MS and breast CA and seizures  PAIN:  Are you having pain? Yes: NPRS scale: 5/10 Pain location: legs, hips down into thighs Pain description: pins and needles, burning sensation Aggravating factors: hard to tell just gets bad sometimes Relieving factors: pain medication, sometimes  movement  Took pain medicine before she came today.  PRECAUTIONS: Fall  WEIGHT BEARING RESTRICTIONS: No  FALLS: Has patient fallen in last 6 months? No, several near falls though  LIVING ENVIRONMENT: Lives with: lives with their daughter [name] Lives in: House/apartment Stairs: No Has following equipment at home: Counselling psychologist, Environmental consultant - 4 wheeled, and shower chair *needs new rollator and needs a BSC  PLOF: Independent with basic ADLs, Independent with gait, Independent with transfers, Requires assistive device for independence, and Needs assistance with homemaking  PATIENT GOALS: "to help me move around a little bit better and improve my balance, less pain"  OBJECTIVE:   DIAGNOSTIC FINDINGS:  TBD, pending results of hip and lumbar spine xrays performed 02/01/23  COGNITION: Overall cognitive status: Impaired "I'm not as sharp as I used to be"   SENSATION: Decreased light touch in R lateral knee  EDEMA:  Swelling in L knee most of the time, occasionally in L ankle  POSTURE: rounded shoulders, forward head, and posterior pelvic tilt  LOWER EXTREMITY ROM:     Passive  Right Eval Left Eval  Hip flexion   [facility]   [facility]  Hip extension    Hip abduction    Hip adduction    Hip internal rotation    Hip external rotation    Knee flexion    Knee extension    Ankle dorsiflexion    Ankle plantarflexion    Ankle inversion    Ankle eversion     (Blank rows = not tested)  LOWER EXTREMITY MMT:    MMT Right Eval Left Eval  Hip flexion 5 2-  Hip extension    Hip abduction    Hip adduction    Hip internal rotation    Hip external rotation    Knee flexion 5 2-  Knee extension 5 3  Ankle dorsiflexion 4 3  Ankle plantarflexion    Ankle inversion    Ankle eversion    (Blank rows = not tested)  BED MOBILITY:  Mod I per pt report *could benefit from bedrail for increased safety and independence  TRANSFERS: Assistive device utilized: Counselling psychologist  Sit  to stand: Modified independence Stand to sit: Modified independence Chair to chair: Modified independence Floor:  not assessed at eval  GAIT: Gait pattern:  L hip ER, decreased hip/knee flexion- Left, decreased ankle dorsiflexion- Left, and antalgic Distance walked: various clinic distances Assistive device utilized: Quad cane small base Level of assistance: CGA Comments: with SBQC in clinic, pt uses rollator at home  FUNCTIONAL TESTS:   [facility] PT Assessment - 02/03/23 1347  Ambulation/Gait   Gait velocity 32.8 ft over 39.69 sec = 0.83 ft/sec      Standardized Balance Assessment   Standardized Balance Assessment Timed Up and Go Test;Five Times Sit to Stand    Five times sit to stand comments  21.15 sec   needs to use arms of chair to stand     Timed Up and Go Test   TUG Normal TUG    Normal TUG (seconds) 40.37   with SBQC, 50.47 with rollator            PATIENT SURVEYS:  MSIS-29: 105/145  TODAY'S TREATMENT:                                                                                                                               PT Evaluation   PATIENT EDUCATION: Education details: Eval findings, PT POC Person educated: Patient and Child(ren) Education method: Explanation Education comprehension: verbalized understanding and needs further education  HOME EXERCISE PROGRAM: To be initiated  GOALS: Goals reviewed with patient? Yes  SHORT TERM GOALS: Target date: 03/03/2023  Pt will be independent with initial HEP for improved strength, balance, transfers and gait. Baseline: Goal status: INITIAL  2.  Pt will improve 5 x STS to less than or equal to 18 seconds to demonstrate improved functional strength and transfer efficiency with BUE support as needed. Baseline: 21.15 sec with heavy UE reliance on arms of chair (6/27) Goal status: INITIAL  3.  Pt will improve normal TUG to less than or equal to 35 seconds for improved functional mobility and decreased  fall risk with LRAD. Baseline: 40.37 sec with SBQC (6/27) Goal status: INITIAL  4.  Pt will improve gait velocity to at least 1.0 ft/sec for improved gait efficiency and performance at mod I level with LRAD. Baseline: 0.83 ft/sec with SBQC (6/27) Goal status: INITIAL   LONG TERM GOALS: Target date: 04/07/2023   Pt will be independent with final HEP for improved strength, balance, transfers and gait. Baseline:  Goal status: INITIAL  2.  Pt will improve 5 x STS to less than or equal to 15 seconds to demonstrate improved functional strength and transfer efficiency with decreased BUE support.  Baseline: 21.15 sec with heavy UE reliance on arms of chair (6/27) Goal status: INITIAL  3.  Pt will improve normal TUG to less than or equal to 30 seconds for improved functional mobility and decreased fall risk with LRAD. Baseline: 40.37 sec with SBQC (6/27) Goal status: INITIAL  4.  Pt will improve gait velocity to at least 1.25 ft/sec for improved gait efficiency and performance at mod I level with LRAD Baseline: 0.83 ft/sec with SBQC (6/27) Goal status: INITIAL  5.  Pt will improve her score on the MSIS-29 to </= 95 points to demonstrate decreased disability level. Baseline: 105/145 (6/27) Goal status: INITIAL    ASSESSMENT:  CLINICAL IMPRESSION: Patient is a 72 year old female referred to Neuro OPPT  for abnormal gait and weakness due to her diagnosis of MS.   Pt's PMH is significant for: relapsing remitting MS and breast CA and seizures. The following deficits were present during the exam: decreased LE strength, decreased sensation in BLE, impaired balance, impaired gait. Based on her frequency of near falls, gait speed of 0.83 ft/sec, TUG score of 40.37 sec, and LE weakness, pt is an increased risk for falls. Pt would benefit from skilled PT to address these impairments and functional limitations to maximize functional mobility independence.   OBJECTIVE IMPAIRMENTS: Abnormal gait,  cardiopulmonary status limiting activity, decreased activity tolerance, decreased balance, decreased endurance, decreased mobility, difficulty walking, decreased strength, increased edema, impaired sensation, impaired UE functional use, and pain.   ACTIVITY LIMITATIONS: carrying, lifting, bending, standing, squatting, stairs, transfers, and bed mobility  PARTICIPATION LIMITATIONS: meal prep, cleaning, laundry, driving, and community activity  PERSONAL FACTORS: Age, Sex, Time since onset of injury/illness/exacerbation, Transportation, and 1-2 comorbidities: RRMS, cancer, seizures  are also affecting patient's functional outcome.   REHAB POTENTIAL: Fair time since onset of MS, previous bouts of therapy  CLINICAL DECISION MAKING: Stable/uncomplicated  EVALUATION COMPLEXITY: Moderate  PLAN:  PT FREQUENCY: 2x/week  PT DURATION: 8 weeks  PLANNED INTERVENTIONS: Therapeutic exercises, Therapeutic activity, Neuromuscular re-education, Balance training, Gait training, Patient/Family education, Self Care, Joint mobilization, Stair training, Vestibular training, Canalith repositioning, Visual/preceptual remediation/compensation, Orthotic/Fit training, DME instructions, Aquatic Therapy, Dry Needling, Electrical stimulation, Cryotherapy, Moist heat, Taping, Manual therapy, and Re-evaluation  PLAN FOR NEXT SESSION: did we get referral for OT; initiate HEP for LLE strengthening and NMR, balance, endurance, gait training with rollator, possible brace assessment? (Pt got foot up brace before and it was not helpful, has not been wearing it)   Peter Congo, PT Peter Congo, PT, DPT, CSRS  02/03/2023, 2:01 PM

## 2023-02-03 NOTE — Telephone Encounter (Signed)
Bethanie Dicker NP, Ms. Selena Bender Maury Dus was evaluated by Physical Therapy on 02/03/23.  The patient would benefit from an Occupational Therapy evaluation for L hand weakness due to her MS.    If you agree, please place an order in [facility] workque in [facility] or fax the order to [phone]. Thank you, Peter Congo, PT, DPT, [facility] [address] [place], [place]  [address] Phone:  [phone] Fax:  [phone]

## 2023-02-04 ENCOUNTER — Other Ambulatory Visit: Payer: Self-pay | Admitting: Nurse Practitioner

## 2023-02-04 DIAGNOSIS — G35 Multiple sclerosis: Secondary | ICD-10-CM

## 2023-02-04 DIAGNOSIS — R29898 Other symptoms and signs involving the musculoskeletal system: Secondary | ICD-10-CM

## 2023-02-04 LAB — COMPLIANCE DRUG ANALYSIS, UR

## 2023-02-14 DIAGNOSIS — E2839 Other primary ovarian failure: Secondary | ICD-10-CM | POA: Diagnosis not present

## 2023-02-14 DIAGNOSIS — G8929 Other chronic pain: Secondary | ICD-10-CM | POA: Diagnosis not present

## 2023-02-14 DIAGNOSIS — Z0000 Encounter for general adult medical examination without abnormal findings: Secondary | ICD-10-CM | POA: Diagnosis not present

## 2023-02-14 DIAGNOSIS — R569 Unspecified convulsions: Secondary | ICD-10-CM | POA: Diagnosis not present

## 2023-02-14 DIAGNOSIS — E7800 Pure hypercholesterolemia, unspecified: Secondary | ICD-10-CM | POA: Diagnosis not present

## 2023-02-14 DIAGNOSIS — I700 Atherosclerosis of aorta: Secondary | ICD-10-CM | POA: Diagnosis not present

## 2023-02-14 DIAGNOSIS — I10 Essential (primary) hypertension: Secondary | ICD-10-CM | POA: Diagnosis not present

## 2023-02-14 DIAGNOSIS — Z853 Personal history of malignant neoplasm of breast: Secondary | ICD-10-CM | POA: Diagnosis not present

## 2023-02-15 ENCOUNTER — Other Ambulatory Visit: Payer: Self-pay | Admitting: Family Medicine

## 2023-02-15 ENCOUNTER — Ambulatory Visit: Payer: Medicare Other | Attending: Nurse Practitioner | Admitting: Physical Therapy

## 2023-02-15 DIAGNOSIS — M6281 Muscle weakness (generalized): Secondary | ICD-10-CM | POA: Diagnosis not present

## 2023-02-15 DIAGNOSIS — R208 Other disturbances of skin sensation: Secondary | ICD-10-CM | POA: Insufficient documentation

## 2023-02-15 DIAGNOSIS — E2839 Other primary ovarian failure: Secondary | ICD-10-CM

## 2023-02-15 DIAGNOSIS — R29818 Other symptoms and signs involving the nervous system: Secondary | ICD-10-CM | POA: Diagnosis not present

## 2023-02-15 DIAGNOSIS — R293 Abnormal posture: Secondary | ICD-10-CM | POA: Diagnosis not present

## 2023-02-15 DIAGNOSIS — R2681 Unsteadiness on feet: Secondary | ICD-10-CM | POA: Diagnosis not present

## 2023-02-15 DIAGNOSIS — R278 Other lack of coordination: Secondary | ICD-10-CM | POA: Diagnosis not present

## 2023-02-15 DIAGNOSIS — R41842 Visuospatial deficit: Secondary | ICD-10-CM | POA: Insufficient documentation

## 2023-02-15 DIAGNOSIS — R2689 Other abnormalities of gait and mobility: Secondary | ICD-10-CM | POA: Insufficient documentation

## 2023-02-15 NOTE — Therapy (Signed)
OUTPATIENT PHYSICAL THERAPY NEURO TREATMENT   Patient Name: Selena Bender MRN: [id number] DOB:[birth year], 72 y.o., female Today's Date: 02/15/2023   PCP: Bethanie Dicker, NP REFERRING PROVIDER: Bethanie Dicker, NP  END OF SESSION:  PT End of Session - 02/15/23 1329     Visit Number 2    Number of Visits 17   with eval   Date for PT Re-Evaluation 04/28/23    Authorization Type Medicare    Progress Note Due on Visit 10    PT Start Time 1327   pt arrived late   PT Stop Time 1403    PT Time Calculation (min) 36 min    Activity Tolerance Patient tolerated treatment well    Behavior During Therapy [facility] for tasks assessed/performed              Past Medical History:  Diagnosis Date   Asthma    Breast cancer (HCC) 2014   Right   Chicken pox    CKD (chronic kidney disease), stage III (HCC)    COVID-19 virus infection 12/2020   Hypertension    Multiple sclerosis (HCC)    Seizures (HCC)    Past Surgical History:  Procedure Laterality Date   BREAST SURGERY     MASTECTOMY Right 2014   Patient Active Problem List   Diagnosis Date Noted   Spinal cord disease (HCC) 02/01/2023   Abnormal MRI, cervical spine (06/06/2020 & 11/13/2022) 02/01/2023   Demyelinating disease of central nervous system (HCC) 02/01/2023   Chronic radicular pain of lower extremity (S1) (Bilateral) 02/01/2023   Lower extremity burning pain 02/01/2023   Chronic low back pain (2ry area of Pain) (Bilateral) (R>L) w/ sciatica (Bilateral) 02/01/2023   Chronic hip pain (Bilateral) (L>R) 02/01/2023   Chronic upper extremity pain (intermittent) (Bilateral) 02/01/2023   Allergic rhinitis 01/14/2023   CKD (chronic kidney disease), stage III (HCC)    COVID-19 virus infection 12/2020   Myelopathy (HCC) 09/25/2020   History of right breast cancer 03/13/2020   Status post right mastectomy 03/13/2020   Abnormal gait 09/15/2018   Claustrophobia 09/15/2018   Hypertension 09/15/2018   Focal epilepsy (HCC)  09/15/2018   Multiple sclerosis (HCC) 09/15/2018   Paresthesia 09/15/2018   Chronic lower extremity pain (1ry area of Pain) (Bilateral) (R>L) 12/31/2015   Seizure (HCC) 12/31/2015   Neuropathic pain 06/02/2015   No advance directives 11/02/2012   Refusal of blood transfusion for reasons of conscience 11/02/2012   Malignant neoplasm of upper-outer quadrant of right breast in female, estrogen receptor positive (HCC) 11/01/2012    ONSET DATE: 01/14/2023 (referral date)  REFERRING DIAG: G35 (ICD-10-CM) - Multiple sclerosis (HCC) R26.9 (ICD-10-CM) - Abnormal gait M62.81 (ICD-10-CM) - Generalized muscle weakness  THERAPY DIAG:  Unsteadiness on feet  Other abnormalities of gait and mobility  Muscle weakness (generalized)  Abnormal posture  Rationale for Evaluation and Treatment: Rehabilitation  SUBJECTIVE:  SUBJECTIVE STATEMENT: Pt reports her pain is better today, she did take pain medication before coming. Pt reports no acute changes since last visit.  Pt reports she is still waiting on results from her hip and lumbar spine xrays, having difficulty getting into MyChart.   Pt accompanied by: self and family member daughter Selena Bender  PERTINENT HISTORY: relapsing remitting MS and breast CA and seizures  PAIN:  Are you having pain? Yes: NPRS scale: 5/10 Pain location: legs, hips down into thighs Pain description: pins and needles, burning sensation Aggravating factors: hard to tell just gets bad sometimes Relieving factors: pain medication, sometimes movement  Took pain medicine before she came today.  PRECAUTIONS: Fall  WEIGHT BEARING RESTRICTIONS: No  FALLS: Has patient fallen in last 6 months? No, several near falls though  LIVING ENVIRONMENT: Lives with: lives with their daughter  Selena Bender Lives in: House/apartment Stairs: No Has following equipment at home: Counselling psychologist, Environmental consultant - 4 wheeled, and shower chair *needs new rollator and needs a BSC  PLOF: Independent with basic ADLs, Independent with gait, Independent with transfers, Requires assistive device for independence, and Needs assistance with homemaking  PATIENT GOALS: "to help me move around a little bit better and improve my balance, less pain"  OBJECTIVE:   DIAGNOSTIC FINDINGS:  B hip xrays 02/01/23: FINDINGS: SI joints are patent. Pubic symphysis and rami appear intact. Mild bilateral hip degenerative changes with minimal spurring and joint space narrowing   IMPRESSION: Mild bilateral hip degenerative changes.  Lumbar spine xray 02/01/23: FINDINGS: Five non rib-bearing lumbar type vertebra. Lumbar alignment within normal limits. Vertebral body heights are maintained. No suspicious change in alignment with flexion or extension. Mild disc space narrowing L2-L3 and L4-L5. Moderate facet degenerative changes worst at L4-L5 and L5-S1.   IMPRESSION: Multilevel degenerative changes as described above.  COGNITION: Overall cognitive status: Impaired "I'm not as sharp as I used to be"   SENSATION: Decreased light touch in R lateral knee  EDEMA:  Swelling in L knee most of the time, occasionally in L ankle  POSTURE: rounded shoulders, forward head, and posterior pelvic tilt  LOWER EXTREMITY ROM:     Passive  Right Eval Left Eval  Hip flexion   [facility]   [facility]  Hip extension    Hip abduction    Hip adduction    Hip internal rotation    Hip external rotation    Knee flexion    Knee extension    Ankle dorsiflexion    Ankle plantarflexion    Ankle inversion    Ankle eversion     (Blank rows = not tested)  LOWER EXTREMITY MMT:    MMT Right Eval Left Eval  Hip flexion 5 2-  Hip extension    Hip abduction    Hip adduction    Hip internal rotation    Hip external rotation    Knee  flexion 5 2-  Knee extension 5 3  Ankle dorsiflexion 4 3  Ankle plantarflexion    Ankle inversion    Ankle eversion    (Blank rows = not tested)   TODAY'S TREATMENT:  TherEx Reviewed HEP from previous POC: Supine bridges x 10 reps Supine marches 2 x 5 reps B AAROM on LLE with assist from towel, AROM on RLE Seated LLE HS curls with towel on floor x 5 reps Attempted supine LLE HS curls, pt needs max A to perform due to LLE weakness and tightness in knee joint Standing LLE HS curls x 5 reps, decreased amplitude of movement with onset of fatigue  TherAct Sit to stands x 5 reps from chair with arms Focus on LE placement, UE control, eccentric control when sitting Doesn't have an appropriate height chair to stand up from at home, provided information for [email or web address] for lift chair  Added appropriate exercises to HEP, see bolded below  PATIENT EDUCATION: Education details: initiated HEP Person educated: Patient and Child(ren) Education method: Chief Technology Officer Education comprehension: verbalized understanding and needs further education  HOME EXERCISE PROGRAM: From prior POC: Access Code: [email or web address] URL: [email or web address] Date: 02/15/2023 Prepared by: Peter Congo  Exercises - Sit to Stand Without Arm Support  - 1 x daily - 7 x weekly - 1 sets - 10 reps - Standing March with Counter Support  - 1 x daily - 7 x weekly - 1 sets - 10 reps - Standing Hip Abduction with Counter Support  - 1 x daily - 7 x weekly - 1 sets - 10 reps - Standing Hip Extension with Counter Support  - 1 x daily - 7 x weekly - 1 sets - 10 reps - Side Stepping with Counter Support  - 1 x daily - 7 x weekly - 1 sets - 10 reps - Seated Ankle Pumps  - 1 x daily - 7 x weekly - 3 sets - 10 reps - Standing Ankle Dorsiflexion Stretch  - 1 x daily - 7 x weekly - 3 sets  - 10 reps - 30 second hold  - Supine March  - 1 x daily - 7 x weekly - 3 sets - 5-10 reps - Supine Bridge  - 1 x daily - 7 x weekly - 1 sets - 10 reps  Bolded = reviewed and added 7/9 Red = previous POC, not reviewed yet  GOALS: Goals reviewed with patient? Yes  SHORT TERM GOALS: Target date: 03/03/2023  Pt will be independent with initial HEP for improved strength, balance, transfers and gait. Baseline: Goal status: INITIAL  2.  Pt will improve 5 x STS to less than or equal to 18 seconds to demonstrate improved functional strength and transfer efficiency with BUE support as needed. Baseline: 21.15 sec with heavy UE reliance on arms of chair (6/27) Goal status: INITIAL  3.  Pt will improve normal TUG to less than or equal to 35 seconds for improved functional mobility and decreased fall risk with LRAD. Baseline: 40.37 sec with SBQC (6/27) Goal status: INITIAL  4.  Pt will improve gait velocity to at least 1.0 ft/sec for improved gait efficiency and performance at mod I level with LRAD. Baseline: 0.83 ft/sec with SBQC (6/27) Goal status: INITIAL   LONG TERM GOALS: Target date: 04/07/2023   Pt will be independent with final HEP for improved strength, balance, transfers and gait. Baseline:  Goal status: INITIAL  2.  Pt will improve 5 x STS to less than or equal to 15 seconds to demonstrate improved functional strength and transfer efficiency with decreased BUE support.  Baseline: 21.15 sec with heavy UE reliance on arms of chair (6/27) Goal status: INITIAL  3.  Pt will improve normal  TUG to less than or equal to 30 seconds for improved functional mobility and decreased fall risk with LRAD. Baseline: 40.37 sec with SBQC (6/27) Goal status: INITIAL  4.  Pt will improve gait velocity to at least 1.25 ft/sec for improved gait efficiency and performance at mod I level with LRAD Baseline: 0.83 ft/sec with SBQC (6/27) Goal status: INITIAL  5.  Pt will improve her score on the  MSIS-29 to </= 95 points to demonstrate decreased disability level. Baseline: 105/145 (6/27) Goal status: INITIAL    ASSESSMENT:  CLINICAL IMPRESSION: Session limited by patient's late arrival. Emphasis of skilled PT session on reviewing some exercises from previous POC and providing handout for new HEP. Pt does require UE support to perform sit to stands safely, does not have an appropriate height chair with arms available at home to practice sit to stands with and her bed is too tall. Pt exhibits significant L hamstring weakness and LE tightness affecting her functional mobility and impairing her ability to perform strengthening exercises. Pt continues to benefit from skilled therapy services to work towards increasing her safety and independence with functional mobility. Continue POC.    OBJECTIVE IMPAIRMENTS: Abnormal gait, cardiopulmonary status limiting activity, decreased activity tolerance, decreased balance, decreased endurance, decreased mobility, difficulty walking, decreased strength, increased edema, impaired sensation, impaired UE functional use, and pain.   ACTIVITY LIMITATIONS: carrying, lifting, bending, standing, squatting, stairs, transfers, and bed mobility  PARTICIPATION LIMITATIONS: meal prep, cleaning, laundry, driving, and community activity  PERSONAL FACTORS: Age, Sex, Time since onset of injury/illness/exacerbation, Transportation, and 1-2 comorbidities: RRMS, cancer, seizures  are also affecting patient's functional outcome.   REHAB POTENTIAL: Fair time since onset of MS, previous bouts of therapy  CLINICAL DECISION MAKING: Stable/uncomplicated  EVALUATION COMPLEXITY: Moderate  PLAN:  PT FREQUENCY: 2x/week  PT DURATION: 8 weeks  PLANNED INTERVENTIONS: Therapeutic exercises, Therapeutic activity, Neuromuscular re-education, Balance training, Gait training, Patient/Family education, Self Care, Joint mobilization, Stair training, Vestibular training, Canalith  repositioning, Visual/preceptual remediation/compensation, Orthotic/Fit training, DME instructions, Aquatic Therapy, Dry Needling, Electrical stimulation, Cryotherapy, Moist heat, Taping, Manual therapy, and Re-evaluation  PLAN FOR NEXT SESSION: continue to review prior HEP and add to HEP for LLE strengthening and NMR, balance, endurance, gait training with rollator, possible brace assessment? (Pt to bring in her old AFOs, willing to trial foot up brace and AFOs again to find best fit)  Could also try estim or bioness in future sessions (let her know when so she can wear shorts)     Peter Congo, PT Peter Congo, PT, DPT, CSRS  02/15/2023, 2:03 PM

## 2023-02-17 ENCOUNTER — Ambulatory Visit: Payer: Medicare Other | Admitting: Physical Therapy

## 2023-02-17 DIAGNOSIS — R2689 Other abnormalities of gait and mobility: Secondary | ICD-10-CM

## 2023-02-17 DIAGNOSIS — M6281 Muscle weakness (generalized): Secondary | ICD-10-CM

## 2023-02-17 DIAGNOSIS — R2681 Unsteadiness on feet: Secondary | ICD-10-CM | POA: Diagnosis not present

## 2023-02-17 DIAGNOSIS — R293 Abnormal posture: Secondary | ICD-10-CM | POA: Diagnosis not present

## 2023-02-17 DIAGNOSIS — R29818 Other symptoms and signs involving the nervous system: Secondary | ICD-10-CM | POA: Diagnosis not present

## 2023-02-17 DIAGNOSIS — R278 Other lack of coordination: Secondary | ICD-10-CM | POA: Diagnosis not present

## 2023-02-17 NOTE — Therapy (Signed)
OUTPATIENT PHYSICAL THERAPY NEURO TREATMENT   Patient Name: Filippa Yarbough MRN: [id number] DOB:[birth year], 72 y.o., female Today's Date: 02/18/2023   PCP: Bethanie Dicker, NP REFERRING PROVIDER: Bethanie Dicker, NP  END OF SESSION:  PT End of Session - 02/17/23 1458     Visit Number 3    Number of Visits 17   with eval   Date for PT Re-Evaluation 04/28/23    Authorization Type Medicare    Progress Note Due on Visit 10    PT Start Time 1454   Pt arrived late and on phone in lobby   PT Stop Time 1530    PT Time Calculation (min) 36 min    Equipment Utilized During Treatment --    Activity Tolerance Patient tolerated treatment well    Behavior During Therapy [facility] for tasks assessed/performed               Past Medical History:  Diagnosis Date   Asthma    Breast cancer (HCC) 2014   Right   Chicken pox    CKD (chronic kidney disease), stage III (HCC)    COVID-19 virus infection 12/2020   Hypertension    Multiple sclerosis (HCC)    Seizures (HCC)    Past Surgical History:  Procedure Laterality Date   BREAST SURGERY     MASTECTOMY Right 2014   Patient Active Problem List   Diagnosis Date Noted   Spinal cord disease (HCC) 02/01/2023   Abnormal MRI, cervical spine (06/06/2020 & 11/13/2022) 02/01/2023   Demyelinating disease of central nervous system (HCC) 02/01/2023   Chronic radicular pain of lower extremity (S1) (Bilateral) 02/01/2023   Lower extremity burning pain 02/01/2023   Chronic low back pain (2ry area of Pain) (Bilateral) (R>L) w/ sciatica (Bilateral) 02/01/2023   Chronic hip pain (Bilateral) (L>R) 02/01/2023   Chronic upper extremity pain (intermittent) (Bilateral) 02/01/2023   Allergic rhinitis 01/14/2023   CKD (chronic kidney disease), stage III (HCC)    COVID-19 virus infection 12/2020   Myelopathy (HCC) 09/25/2020   History of right breast cancer 03/13/2020   Status post right mastectomy 03/13/2020   Abnormal gait 09/15/2018   Claustrophobia  09/15/2018   Hypertension 09/15/2018   Focal epilepsy (HCC) 09/15/2018   Multiple sclerosis (HCC) 09/15/2018   Paresthesia 09/15/2018   Chronic lower extremity pain (1ry area of Pain) (Bilateral) (R>L) 12/31/2015   Seizure (HCC) 12/31/2015   Neuropathic pain 06/02/2015   No advance directives 11/02/2012   Refusal of blood transfusion for reasons of conscience 11/02/2012   Malignant neoplasm of upper-outer quadrant of right breast in female, estrogen receptor positive (HCC) 11/01/2012    ONSET DATE: 01/14/2023 (referral date)  REFERRING DIAG: G35 (ICD-10-CM) - Multiple sclerosis (HCC) R26.9 (ICD-10-CM) - Abnormal gait M62.81 (ICD-10-CM) - Generalized muscle weakness  THERAPY DIAG:  Unsteadiness on feet  Other abnormalities of gait and mobility  Muscle weakness (generalized)  Rationale for Evaluation and Treatment: Rehabilitation  SUBJECTIVE:  SUBJECTIVE STATEMENT: Pt presents with her newer knee immobilizing brace. States she cannot find her old one.     Pt accompanied by: self and family member daughter [name]  PERTINENT HISTORY: relapsing remitting MS and breast CA and seizures  PAIN:  Are you having pain? Yes: NPRS scale: 4-5/10 Pain location: legs, hips down into thighs Pain description: pins and needles, burning sensation Aggravating factors: hard to tell just gets bad sometimes Relieving factors: pain medication, sometimes movement  Took pain medicine before she came today.  PRECAUTIONS: Fall  WEIGHT BEARING RESTRICTIONS: No  FALLS: Has patient fallen in last 6 months? No, several near falls though  LIVING ENVIRONMENT: Lives with: lives with their daughter [name] Lives in: House/apartment Stairs: No Has following equipment at home: Counselling psychologist, Environmental consultant - 4 wheeled, and  shower chair *needs new rollator and needs a BSC  PLOF: Independent with basic ADLs, Independent with gait, Independent with transfers, Requires assistive device for independence, and Needs assistance with homemaking  PATIENT GOALS: "to help me move around a little bit better and improve my balance, less pain"  OBJECTIVE:   DIAGNOSTIC FINDINGS:  B hip xrays 02/01/23: FINDINGS: SI joints are patent. Pubic symphysis and rami appear intact. Mild bilateral hip degenerative changes with minimal spurring and joint space narrowing   IMPRESSION: Mild bilateral hip degenerative changes.  Lumbar spine xray 02/01/23: FINDINGS: Five non rib-bearing lumbar type vertebra. Lumbar alignment within normal limits. Vertebral body heights are maintained. No suspicious change in alignment with flexion or extension. Mild disc space narrowing L2-L3 and L4-L5. Moderate facet degenerative changes worst at L4-L5 and L5-S1.   IMPRESSION: Multilevel degenerative changes as described above.  COGNITION: Overall cognitive status: Impaired "I'm not as sharp as I used to be"   SENSATION: Decreased light touch in R lateral knee  EDEMA:  Swelling in L knee most of the time, occasionally in L ankle  POSTURE: rounded shoulders, forward head, and posterior pelvic tilt  LOWER EXTREMITY ROM:     Passive  Right Eval Left Eval  Hip flexion   [facility]   [facility]  Hip extension    Hip abduction    Hip adduction    Hip internal rotation    Hip external rotation    Knee flexion    Knee extension    Ankle dorsiflexion    Ankle plantarflexion    Ankle inversion    Ankle eversion     (Blank rows = not tested)  LOWER EXTREMITY MMT:    MMT Right Eval Left Eval  Hip flexion 5 2-  Hip extension    Hip abduction    Hip adduction    Hip internal rotation    Hip external rotation    Knee flexion 5 2-  Knee extension 5 3  Ankle dorsiflexion 4 3  Ankle plantarflexion    Ankle inversion    Ankle eversion     (Blank rows = not tested)   TODAY'S TREATMENT:  NMR  Mass practice of sit to stands, starting while seated on 16" mat w/ 4" airex under bottom and progressing to sitting on 16" mat. Noted every narrow BOS and scissoring of LLE, resulting in posterolateral LOB to L side. Pt required max multimodal cues for proper foot placement bilaterally, facilitation of anterior weight shift and proper body positioning. Pt able to perform sit to stands w/min A and no UE support while seated on foam, x10 reps. Without foam, pt unable to perform without hands and losing balance posteriorly. Pt also places RLE posteriorly to LLE w/transfer despite max cues and physical block by therapist. Provided pt w/updated handout.     PATIENT EDUCATION: Education details: initiated HEP Person educated: Patient and Child(ren) Education method: Chief Technology Officer Education comprehension: verbalized understanding and needs further education  HOME EXERCISE PROGRAM: From prior POC: Access Code: [email or web address] URL: [email or web address] Date: 02/15/2023 Prepared by: Peter Congo  Exercises - Sit to Stand Without Arm Support  - 1 x daily - 7 x weekly - 1 sets - 10 reps - Standing March with Counter Support  - 1 x daily - 7 x weekly - 1 sets - 10 reps - Standing Hip Abduction with Counter Support  - 1 x daily - 7 x weekly - 1 sets - 10 reps - Standing Hip Extension with Counter Support  - 1 x daily - 7 x weekly - 1 sets - 10 reps - Side Stepping with Counter Support  - 1 x daily - 7 x weekly - 1 sets - 10 reps - Seated Ankle Pumps  - 1 x daily - 7 x weekly - 3 sets - 10 reps - Standing Ankle Dorsiflexion Stretch  - 1 x daily - 7 x weekly - 3 sets - 10 reps - 30 second hold  - Supine March  - 1 x daily - 7 x weekly - 3 sets - 5-10 reps - Supine Bridge  - 1 x daily - 7 x weekly - 1  sets - 10 reps  Bolded = reviewed and added 7/9 Red = previous POC, not reviewed yet  GOALS: Goals reviewed with patient? Yes  SHORT TERM GOALS: Target date: 03/03/2023  Pt will be independent with initial HEP for improved strength, balance, transfers and gait. Baseline: Goal status: INITIAL  2.  Pt will improve 5 x STS to less than or equal to 18 seconds to demonstrate improved functional strength and transfer efficiency with BUE support as needed. Baseline: 21.15 sec with heavy UE reliance on arms of chair (6/27) Goal status: INITIAL  3.  Pt will improve normal TUG to less than or equal to 35 seconds for improved functional mobility and decreased fall risk with LRAD. Baseline: 40.37 sec with SBQC (6/27) Goal status: INITIAL  4.  Pt will improve gait velocity to at least 1.0 ft/sec for improved gait efficiency and performance at mod I level with LRAD. Baseline: 0.83 ft/sec with SBQC (6/27) Goal status: INITIAL   LONG TERM GOALS: Target date: 04/07/2023   Pt will be independent with final HEP for improved strength, balance, transfers and gait. Baseline:  Goal status: INITIAL  2.  Pt will improve 5 x STS to less than or equal to 15 seconds to demonstrate improved functional strength and transfer efficiency with decreased BUE support.  Baseline: 21.15 sec with heavy UE reliance on arms of chair (6/27) Goal status: INITIAL  3.  Pt will improve normal TUG to less than or equal to 30 seconds for  improved functional mobility and decreased fall risk with LRAD. Baseline: 40.37 sec with SBQC (6/27) Goal status: INITIAL  4.  Pt will improve gait velocity to at least 1.25 ft/sec for improved gait efficiency and performance at mod I level with LRAD Baseline: 0.83 ft/sec with SBQC (6/27) Goal status: INITIAL  5.  Pt will improve her score on the MSIS-29 to </= 95 points to demonstrate decreased disability level. Baseline: 105/145 (6/27) Goal status:  INITIAL    ASSESSMENT:  CLINICAL IMPRESSION: Session limited by patient's late arrival. Emphasis of skilled PT session on reviewing some exercises from previous POC and providing handout for new HEP. Pt does require UE support to perform sit to stands safely, does not have an appropriate height chair with arms available at home to practice sit to stands with and her bed is too tall. Pt exhibits significant L hamstring weakness and LE tightness affecting her functional mobility and impairing her ability to perform strengthening exercises. Pt continues to benefit from skilled therapy services to work towards increasing her safety and independence with functional mobility. Continue POC.    OBJECTIVE IMPAIRMENTS: Abnormal gait, cardiopulmonary status limiting activity, decreased activity tolerance, decreased balance, decreased endurance, decreased mobility, difficulty walking, decreased strength, increased edema, impaired sensation, impaired UE functional use, and pain.   ACTIVITY LIMITATIONS: carrying, lifting, bending, standing, squatting, stairs, transfers, and bed mobility  PARTICIPATION LIMITATIONS: meal prep, cleaning, laundry, driving, and community activity  PERSONAL FACTORS: Age, Sex, Time since onset of injury/illness/exacerbation, Transportation, and 1-2 comorbidities: RRMS, cancer, seizures  are also affecting patient's functional outcome.   REHAB POTENTIAL: Fair time since onset of MS, previous bouts of therapy  CLINICAL DECISION MAKING: Stable/uncomplicated  EVALUATION COMPLEXITY: Moderate  PLAN:  PT FREQUENCY: 2x/week  PT DURATION: 8 weeks  PLANNED INTERVENTIONS: Therapeutic exercises, Therapeutic activity, Neuromuscular re-education, Balance training, Gait training, Patient/Family education, Self Care, Joint mobilization, Stair training, Vestibular training, Canalith repositioning, Visual/preceptual remediation/compensation, Orthotic/Fit training, DME instructions, Aquatic  Therapy, Dry Needling, Electrical stimulation, Cryotherapy, Moist heat, Taping, Manual therapy, and Re-evaluation  PLAN FOR NEXT SESSION: continue to review prior HEP and add to HEP for LLE strengthening and NMR, balance, endurance, gait training with rollator, possible brace assessment? (Pt to bring in her old AFOs, willing to trial foot up brace and AFOs again to find best fit)  Could also try estim or bioness in future sessions (let her know when so she can wear shorts)     Jill Alexanders Jazelle Achey, PT, DPT  02/18/2023, 7:53 AM

## 2023-02-22 ENCOUNTER — Encounter: Payer: Self-pay | Admitting: Occupational Therapy

## 2023-02-22 ENCOUNTER — Ambulatory Visit: Payer: Medicare Other | Admitting: Occupational Therapy

## 2023-02-22 ENCOUNTER — Ambulatory Visit: Payer: Medicare Other | Admitting: Physical Therapy

## 2023-02-22 DIAGNOSIS — R2681 Unsteadiness on feet: Secondary | ICD-10-CM

## 2023-02-22 DIAGNOSIS — R278 Other lack of coordination: Secondary | ICD-10-CM | POA: Diagnosis not present

## 2023-02-22 DIAGNOSIS — R208 Other disturbances of skin sensation: Secondary | ICD-10-CM

## 2023-02-22 DIAGNOSIS — R2689 Other abnormalities of gait and mobility: Secondary | ICD-10-CM

## 2023-02-22 DIAGNOSIS — R29818 Other symptoms and signs involving the nervous system: Secondary | ICD-10-CM

## 2023-02-22 DIAGNOSIS — M6281 Muscle weakness (generalized): Secondary | ICD-10-CM | POA: Diagnosis not present

## 2023-02-22 DIAGNOSIS — R293 Abnormal posture: Secondary | ICD-10-CM | POA: Diagnosis not present

## 2023-02-22 DIAGNOSIS — R41842 Visuospatial deficit: Secondary | ICD-10-CM

## 2023-02-22 NOTE — Therapy (Signed)
OUTPATIENT PHYSICAL THERAPY NEURO TREATMENT   Patient Name: Selena Bender MRN: [id number] DOB:April 01, 1951, 72 y.o., female Today's Date: 02/22/2023   PCP: Bethanie Dicker, NP REFERRING PROVIDER: Bethanie Dicker, NP  END OF SESSION:  PT End of Session - 02/22/23 1025     Visit Number 4    Number of Visits 17   with eval   Date for PT Re-Evaluation 04/28/23    Authorization Type Medicare    Progress Note Due on Visit 10    PT Start Time 1021   Pt arrived late   PT Stop Time 1101    PT Time Calculation (min) 40 min    Equipment Utilized During Treatment Gait belt    Activity Tolerance Patient tolerated treatment well    Behavior During Therapy WFL for tasks assessed/performed               Past Medical History:  Diagnosis Date   Asthma    Breast cancer (HCC) 2014   Right   Chicken pox    CKD (chronic kidney disease), stage III (HCC)    COVID-19 virus infection 12/2020   Hypertension    Multiple sclerosis (HCC)    Seizures (HCC)    Past Surgical History:  Procedure Laterality Date   BREAST SURGERY     MASTECTOMY Right 2014   Patient Active Problem List   Diagnosis Date Noted   Spinal cord disease (HCC) 02/01/2023   Abnormal MRI, cervical spine (06/06/2020 & 11/13/2022) 02/01/2023   Demyelinating disease of central nervous system (HCC) 02/01/2023   Chronic radicular pain of lower extremity (S1) (Bilateral) 02/01/2023   Lower extremity burning pain 02/01/2023   Chronic low back pain (2ry area of Pain) (Bilateral) (R>L) w/ sciatica (Bilateral) 02/01/2023   Chronic hip pain (Bilateral) (L>R) 02/01/2023   Chronic upper extremity pain (intermittent) (Bilateral) 02/01/2023   Allergic rhinitis 01/14/2023   CKD (chronic kidney disease), stage III (HCC)    COVID-19 virus infection 12/2020   Myelopathy (HCC) 09/25/2020   History of right breast cancer 03/13/2020   Status post right mastectomy 03/13/2020   Abnormal gait 09/15/2018   Claustrophobia 09/15/2018    Hypertension 09/15/2018   Focal epilepsy (HCC) 09/15/2018   Multiple sclerosis (HCC) 09/15/2018   Paresthesia 09/15/2018   Chronic lower extremity pain (1ry area of Pain) (Bilateral) (R>L) 12/31/2015   Seizure (HCC) 12/31/2015   Neuropathic pain 06/02/2015   No advance directives 11/02/2012   Refusal of blood transfusion for reasons of conscience 11/02/2012   Malignant neoplasm of upper-outer quadrant of right breast in female, estrogen receptor positive (HCC) 11/01/2012    ONSET DATE: 01/14/2023 (referral date)  REFERRING DIAG: G35 (ICD-10-CM) - Multiple sclerosis (HCC) R26.9 (ICD-10-CM) - Abnormal gait M62.81 (ICD-10-CM) - Generalized muscle weakness  THERAPY DIAG:  Unsteadiness on feet  Other abnormalities of gait and mobility  Muscle weakness (generalized)  Rationale for Evaluation and Treatment: Rehabilitation  SUBJECTIVE:  SUBJECTIVE STATEMENT: Pt presents with old genu recurvatum brace. Reports she is having more leg pain today. No falls.     Pt accompanied by: self and family member daughter [name] (outside)  PERTINENT HISTORY: relapsing remitting MS and breast CA and seizures  PAIN:  Are you having pain? Yes: NPRS scale: 4-5/10 Pain location: legs, hips down into thighs Pain description: pins and needles, burning sensation Aggravating factors: hard to tell just gets bad sometimes Relieving factors: pain medication, sometimes movement  Took pain medicine before she came today.  PRECAUTIONS: Fall  WEIGHT BEARING RESTRICTIONS: No  FALLS: Has patient fallen in last 6 months? No, several near falls though  LIVING ENVIRONMENT: Lives with: lives with their daughter [name] Lives in: House/apartment Stairs: No Has following equipment at home: Counselling psychologist, Environmental consultant - 4 wheeled,  and shower chair *needs new rollator and needs a BSC  PLOF: Independent with basic ADLs, Independent with gait, Independent with transfers, Requires assistive device for independence, and Needs assistance with homemaking  PATIENT GOALS: "to help me move around a little bit better and improve my balance, less pain"  OBJECTIVE:   DIAGNOSTIC FINDINGS:  B hip xrays 02/01/23: FINDINGS: SI joints are patent. Pubic symphysis and rami appear intact. Mild bilateral hip degenerative changes with minimal spurring and joint space narrowing   IMPRESSION: Mild bilateral hip degenerative changes.  Lumbar spine xray 02/01/23: FINDINGS: Five non rib-bearing lumbar type vertebra. Lumbar alignment within normal limits. Vertebral body heights are maintained. No suspicious change in alignment with flexion or extension. Mild disc space narrowing L2-L3 and L4-L5. Moderate facet degenerative changes worst at L4-L5 and L5-S1.   IMPRESSION: Multilevel degenerative changes as described above.  COGNITION: Overall cognitive status: Impaired "I'm not as sharp as I used to be"   SENSATION: Decreased light touch in R lateral knee  EDEMA:  Swelling in L knee most of the time, occasionally in L ankle  POSTURE: rounded shoulders, forward head, and posterior pelvic tilt  LOWER EXTREMITY ROM:     Passive  Right Eval Left Eval  Hip flexion   [facility]   [facility]  Hip extension    Hip abduction    Hip adduction    Hip internal rotation    Hip external rotation    Knee flexion    Knee extension    Ankle dorsiflexion    Ankle plantarflexion    Ankle inversion    Ankle eversion     (Blank rows = not tested)  LOWER EXTREMITY MMT:    MMT Right Eval Left Eval  Hip flexion 5 2-  Hip extension    Hip abduction    Hip adduction    Hip internal rotation    Hip external rotation    Knee flexion 5 2-  Knee extension 5 3  Ankle dorsiflexion 4 3  Ankle plantarflexion    Ankle inversion    Ankle  eversion    (Blank rows = not tested)   TODAY'S TREATMENT:  NMR  Pt requested to continue working on sit to stands, as she continues to have difficulty with them at home. Started on standard mat height w/4" airex under bottom x10 reps without UE support and pt continues to demonstrate retropulsion w/movement due to anterior LLE foot placement, premature knee extension bilaterally and sitting too deep on seated surface. Pt unknowingly relies heavily on bracing against seated surface to stabilize posterior lean. Provided mod-max verbal cues to scoot hips towards edge and perform anterior weight shift and pt able to perform well. Progressed to removing airex and pt performed 10 reps w/about 50% success rate of proper anterior weight shift and scooting hips towards edge of mat. Finally progressed to holding 4# med ball at chest and performing 2x6 reps for added BLE strength and counterbalance for anterior weight shift. Pt performed well until reaching fatigue, in which she was unable to shift weight anteriorly and could not remember to scoot hips to edge of mat. Encouraged pt to continue practicing this at home w/emphasis on proper hip/foot placement and anterior lean, as pt is strong enough to perform. Pt verbalized understanding.    6 Blaze pods on random reach setting for improved hip abduction strength, LE coordination and single leg stability.  Performed on 2 minute intervals with 30s rest periods.  Pt requires CGA/min A guarding. Round 1:  lateral setup.  18 hits w/BUE support on ballet bar Round 2:  lateral setup.  15 hits. No hands Notable errors/deficits:  Increased difficulty tapping w/LLE due to decreased weight shift to RLE and LLE weakness. Pt hesitant to attempt task without UE support but task too easy w/UE support    PATIENT EDUCATION: Education details: Continue  practicing sit to stands at home  Person educated: Patient Education method: Programmer, multimedia, Demonstration, and Verbal cues Education comprehension: verbalized understanding, returned demonstration, verbal cues required, and needs further education  HOME EXERCISE PROGRAM: From prior POC: Access Code: [email or web address] URL: [email or web address] Date: 02/15/2023 Prepared by: Peter Congo  Exercises - Sit to Stand Without Arm Support  - 1 x daily - 7 x weekly - 1 sets - 10 reps - Standing March with Counter Support  - 1 x daily - 7 x weekly - 1 sets - 10 reps - Standing Hip Abduction with Counter Support  - 1 x daily - 7 x weekly - 1 sets - 10 reps - Standing Hip Extension with Counter Support  - 1 x daily - 7 x weekly - 1 sets - 10 reps - Side Stepping with Counter Support  - 1 x daily - 7 x weekly - 1 sets - 10 reps - Seated Ankle Pumps  - 1 x daily - 7 x weekly - 3 sets - 10 reps - Standing Ankle Dorsiflexion Stretch  - 1 x daily - 7 x weekly - 3 sets - 10 reps - 30 second hold  - Supine March  - 1 x daily - 7 x weekly - 3 sets - 5-10 reps - Supine Bridge  - 1 x daily - 7 x weekly - 1 sets - 10 reps  Black = reviewed  Red = previous POC, not reviewed yet  GOALS: Goals reviewed with patient? Yes  SHORT TERM GOALS: Target date: 03/03/2023  Pt will be independent with initial HEP for improved strength, balance, transfers and gait. Baseline: Goal status: INITIAL  2.  Pt will improve 5 x STS to less than or equal to 18 seconds to demonstrate improved functional strength and transfer efficiency  with BUE support as needed. Baseline: 21.15 sec with heavy UE reliance on arms of chair (6/27) Goal status: INITIAL  3.  Pt will improve normal TUG to less than or equal to 35 seconds for improved functional mobility and decreased fall risk with LRAD. Baseline: 40.37 sec with SBQC (6/27) Goal status: INITIAL  4.  Pt will improve gait velocity to at least 1.0 ft/sec for improved gait  efficiency and performance at mod I level with LRAD. Baseline: 0.83 ft/sec with SBQC (6/27) Goal status: INITIAL   LONG TERM GOALS: Target date: 04/07/2023   Pt will be independent with final HEP for improved strength, balance, transfers and gait. Baseline:  Goal status: INITIAL  2.  Pt will improve 5 x STS to less than or equal to 15 seconds to demonstrate improved functional strength and transfer efficiency with decreased BUE support.  Baseline: 21.15 sec with heavy UE reliance on arms of chair (6/27) Goal status: INITIAL  3.  Pt will improve normal TUG to less than or equal to 30 seconds for improved functional mobility and decreased fall risk with LRAD. Baseline: 40.37 sec with SBQC (6/27) Goal status: INITIAL  4.  Pt will improve gait velocity to at least 1.25 ft/sec for improved gait efficiency and performance at mod I level with LRAD Baseline: 0.83 ft/sec with SBQC (6/27) Goal status: INITIAL  5.  Pt will improve her score on the MSIS-29 to </= 95 points to demonstrate decreased disability level. Baseline: 105/145 (6/27) Goal status: INITIAL    ASSESSMENT:  CLINICAL IMPRESSION: Emphasis of skilled PT session on sit to stand transfers, LE coordination, single leg stability and BLE strength. Pt continues to demonstrate retropulsion w/sit to stand transfers due to poor body mechanics, decreased anterior weight shift and decreased confidence in ability. However, pt demonstrates improved strength and ability this date compared to previous PT sessions. Pt hesitant to perform tasks without UE support but does well w/max encouragement. Continue POC.     OBJECTIVE IMPAIRMENTS: Abnormal gait, cardiopulmonary status limiting activity, decreased activity tolerance, decreased balance, decreased endurance, decreased mobility, difficulty walking, decreased strength, increased edema, impaired sensation, impaired UE functional use, and pain.   ACTIVITY LIMITATIONS: carrying, lifting,  bending, standing, squatting, stairs, transfers, and bed mobility  PARTICIPATION LIMITATIONS: meal prep, cleaning, laundry, driving, and community activity  PERSONAL FACTORS: Age, Sex, Time since onset of injury/illness/exacerbation, Transportation, and 1-2 comorbidities: RRMS, cancer, seizures  are also affecting patient's functional outcome.   REHAB POTENTIAL: Fair time since onset of MS, previous bouts of therapy  CLINICAL DECISION MAKING: Stable/uncomplicated  EVALUATION COMPLEXITY: Moderate  PLAN:  PT FREQUENCY: 2x/week  PT DURATION: 8 weeks  PLANNED INTERVENTIONS: Therapeutic exercises, Therapeutic activity, Neuromuscular re-education, Balance training, Gait training, Patient/Family education, Self Care, Joint mobilization, Stair training, Vestibular training, Canalith repositioning, Visual/preceptual remediation/compensation, Orthotic/Fit training, DME instructions, Aquatic Therapy, Dry Needling, Electrical stimulation, Cryotherapy, Moist heat, Taping, Manual therapy, and Re-evaluation  PLAN FOR NEXT SESSION: Update HEP for more challenging LLE strengthening and NMR, balance, endurance, gait training with rollator, possible brace assessment? (Pt's old genu recurvatum brace is in [employer])  Could also try estim or bioness in future sessions (let her know when so she can wear shorts)     Herta Hink E Torrance Frech, PT, DPT 02/22/2023, 11:02 AM

## 2023-02-22 NOTE — Therapy (Signed)
OUTPATIENT OCCUPATIONAL THERAPY NEURO EVALUATION  Patient Name: Selena Bender MRN: [id number] DOB:1951/02/25, 72 y.o., female Today's Date: 02/22/2023  PCP: Bethanie Dicker, NP REFERRING PROVIDER: Bethanie Dicker, NP   END OF SESSION:  OT End of Session - 02/22/23 1102     Visit Number 1    Number of Visits 12   + evaluation   Date for OT Re-Evaluation 04/15/23    Authorization Type Medicare and [employer] VL 60    Progress Note Due on Visit 10    OT Start Time 1102    OT Stop Time 1152    OT Time Calculation (min) 50 min    Equipment Utilized During Treatment Testing Materials    Activity Tolerance Patient tolerated treatment well    Behavior During Therapy WFL for tasks assessed/performed             Past Medical History:  Diagnosis Date   Asthma    Breast cancer (HCC) 2014   Right   Chicken pox    CKD (chronic kidney disease), stage III (HCC)    COVID-19 virus infection 12/2020   Hypertension    Multiple sclerosis (HCC)    Seizures (HCC)    Past Surgical History:  Procedure Laterality Date   BREAST SURGERY     MASTECTOMY Right 2014   Patient Active Problem List   Diagnosis Date Noted   Spinal cord disease (HCC) 02/01/2023   Abnormal MRI, cervical spine (06/06/2020 & 11/13/2022) 02/01/2023   Demyelinating disease of central nervous system (HCC) 02/01/2023   Chronic radicular pain of lower extremity (S1) (Bilateral) 02/01/2023   Lower extremity burning pain 02/01/2023   Chronic low back pain (2ry area of Pain) (Bilateral) (R>L) w/ sciatica (Bilateral) 02/01/2023   Chronic hip pain (Bilateral) (L>R) 02/01/2023   Chronic upper extremity pain (intermittent) (Bilateral) 02/01/2023   Allergic rhinitis 01/14/2023   CKD (chronic kidney disease), stage III (HCC)    COVID-19 virus infection 12/2020   Myelopathy (HCC) 09/25/2020   History of right breast cancer 03/13/2020   Status post right mastectomy 03/13/2020   Abnormal gait 09/15/2018   Claustrophobia  09/15/2018   Hypertension 09/15/2018   Focal epilepsy (HCC) 09/15/2018   Multiple sclerosis (HCC) 09/15/2018   Paresthesia 09/15/2018   Chronic lower extremity pain (1ry area of Pain) (Bilateral) (R>L) 12/31/2015   Seizure (HCC) 12/31/2015   Neuropathic pain 06/02/2015   No advance directives 11/02/2012   Refusal of blood transfusion for reasons of conscience 11/02/2012   Malignant neoplasm of upper-outer quadrant of right breast in female, estrogen receptor positive (HCC) 11/01/2012    ONSET DATE: 02/04/2023  REFERRING DIAG: X91.478 (ICD-10-CM) - Left hand weakness G35 (ICD-10-CM) - Multiple sclerosis (HCC)  THERAPY DIAG:  Muscle weakness (generalized)  Other lack of coordination  Visuospatial deficit  Other disturbances of skin sensation  Unsteadiness on feet  Other symptoms and signs involving the nervous system  Rationale for Evaluation and Treatment: Rehabilitation  SUBJECTIVE:   SUBJECTIVE STATEMENT: Patient reported that it seems as though her hands are not working the way they were supposed to and noted that her L pinkie is getting locked up (ie. in flexion).  During evaluation, patient reported that she used to be able to type 90WPM.  Pt accompanied by: self and family member - daughter Dannielle Huh  PERTINENT HISTORY:   Pt is a 72 yr old AA female with hx of relapsing remitting MS and breast CA with mastectomy as well as seizures, chronic pain in B legs/arms and  a lot of nerve pain.  PRECAUTIONS: None  WEIGHT BEARING RESTRICTIONS: No  PAIN:  Are you having pain? Yes: NPRS scale: 7 earlier today but 5/10 Pain location: legs all the way to feet and some in hands occasionally Pain description: nerve pain Aggravating factors: If I get tired and it doesn't take much to get tired lately. Relieving factors: Sleep. Took medication this morning before she came to therapy  FALLS: Has patient fallen in last 6 months? No  LIVING ENVIRONMENT: Lives with: lives with  their daughter Lives in: Apartment Stairs: No Has following equipment at home: Counselling psychologist, Environmental consultant - 4 wheeled, shower chair, Grab bars, and has grab bar that needs to be installed as the suction grab bar came off the wall during a shower a few months ago - also may need elevated BSC  PLOF: Independent with basic ADLs, Independent with household mobility with device, and Needs assistance with homemaking  PATIENT GOALS: Use my hands better.  OBJECTIVE:   HAND DOMINANCE: Right  ADLs: Overall ADLs: Pt generally MI Transfers/ambulation related to ADLs: MI with AE Eating: I Grooming: Gets hair/nails done professionally UB Dressing: Sits down to get dressed LB Dressing: Able to do it with a little bit of a struggle but "I can still do it." Toileting: MI Bathing: Showers 2x/week Tub Shower transfers: Supervision - needs grab bar replaced Equipment: Shower seat with back, Walk in shower, and Reacher  IADLs: Shopping: I go shopping sometimes (does not drive). Light housekeeping: I do my own laundry Meal Prep: Daughter does most of it. Every now and then I get in the kitchen. Community mobility: Uses cane in public and walker at home (needs a new rollator due to poor brakes etc) - may need rx for new walker Medication management: Self managed with pill bottles - only uses a pill box only when she goes away/vacation Financial management: Self managed Handwriting: 50% legible   MOBILITY STATUS: Needs Assist: uses AE - rollator at home or cane in public  POSTURE COMMENTS:  No Significant postural limitations Sitting balance: WFL  ACTIVITY TOLERANCE: Activity tolerance: Patient reports only doing 1 thing/day ie) shopping, appt or getting nails done.  Needs to take breaks ie) when doing her hair.  FUNCTIONAL OUTCOME MEASURES: TBD  UPPER EXTREMITY ROM:    Active ROM Right eval Left eval  Shoulder flexion [facility]   Shoulder abduction    Shoulder adduction    Shoulder  extension    Shoulder internal rotation    Shoulder external rotation    Elbow flexion    Elbow extension  Decreased end range  Wrist flexion    Wrist extension    Wrist ulnar deviation    Wrist radial deviation    Wrist pronation    Wrist supination    (Blank rows = not tested)  UPPER EXTREMITY MMT:     MMT Right eval Left eval  Shoulder flexion 4/5 3/5  Shoulder abduction    Shoulder adduction    Shoulder extension    Shoulder internal rotation    Shoulder external rotation    Middle trapezius    Lower trapezius    Elbow flexion    Elbow extension    Wrist flexion    Wrist extension    Wrist ulnar deviation    Wrist radial deviation    Wrist pronation    Wrist supination    (Blank rows = not tested)  HAND FUNCTION: Grip strength: Right: 33.2, 44.9, 35.0  lbs; Left: 19.8, 23.5, 21.8 lbs Evaluation Average: Right 37.7 lbs Left 21.7 lbs  COORDINATION: 9 Hole Peg test: Right: 47.93 sec; Left: 2:00:03 sec Box and Blocks:  Right 22 blocks, Left 18 blocks  SENSATION: Light touch: Impaired  Patient reports R side intact L palmar surface a bit dull and tingly in comparison.  EDEMA: NS  MUSCLE TONE:   COGNITION: Overall cognitive status:  Patient does report some changes in memory  VISION: Subjective report: L eye wanders and doesn't focus (does not like to look in the mirror due to changes which are also noted and pointed out by other family members) Baseline vision: Bifocals Visual history: NA  VISION ASSESSMENT: Impaired Tracking/Visual pursuits: Decreased smoothness with horizontal tracking  Patient has difficulty with following activities due to following visual impairments: eye wanders  PERCEPTION: Not tested  PRAXIS: Not tested  OBSERVATIONS: Patient is a well groomed lady who regularly has her nails manicured.  She has L pinkie finger often held in a tight hook position often with difficulty extending it actively.  Awkward positioning noted with  pegboard test with L arm ie) alternating between turning wrist pronated/supinated at times.    TODAY'S TREATMENT:                                                                                                                              DATE: NA   PATIENT EDUCATION: Education details: OT role and POC considerations Person educated: Patient and Child(ren) Education method: Explanation Education comprehension: verbalized understanding and needs further education  HOME EXERCISE PROGRAM: TBD   GOALS: Goals reviewed with patient? Yes   GOALS:  SHORT TERM GOALS: Target date: 03/17/2023    Patient will demonstrate BUE HEP (ROM, strength & coordination) with 25% verbal cues or less for proper execution. Baseline: not yet initiated Goal status: INITIAL  2.  Pt will verbalize understanding of adapted strategies/equipment to maximize safety and independence with ADLs/IADLs. Baseline: not yet initiated Goal status: INITIAL  3.  Patient will demonstrate at least 40 lbs R grip and 25 lbs L grip strength as needed to open jars and other containers.  Baseline: Evaluation Average: Right 37.7 lbs Left 21.7 lbs  Goal statues: INITIAL  4. Patient will be aware of sensory stimulation and compensatory strategies for diminished sensation.  Baseline: Patient reports R side intact L palmar surface a bit dull and tingly in comparison.  Goal Status: INITIAL  5.  Pt will be able to place at least 5 more blocks with each hand with completion of Box and Blocks test. Baseline: Box and Blocks:  Right 22 blocks, Left 18 blocks Goal status: INITIAL  6.  Pt will demonstrate use of binder for information organization of HEPs, MS resources and medical records. Baseline: not yet initiated - daughter present at eval for recommendation Goal status: INITIAL  LONG TERM GOALS: Target date: 04/08/2023    Pt will verbalize understanding (or locate information in info  binder) re: ways to prevent future MS  related complications and MS community resources. Baseline: not yet initiated Goal status: INITIAL  2.  Pt will verbalize understanding of ways to keep thinking skills sharp and ways to compensate for STM changes in the future. Baseline: not yet initiated Goal status: INITIAL  3.  Pt will demonstrate improved fine motor coordination for ADLs as evidenced by decreasing 9 hole peg test score for BUE by 10-15 secs. Baseline: 9 Hole Peg test: Right: 47.93 sec; Left: 2:00:03 sec Goal status: INITIAL  4.  Pt will demonstrate improved positioning of L pinkie finger through proper day and/or nighttime splinting as deemed effective, comfortable and appropriate. Baseline: poor active extension of PIP joint Goal status: INITIAL      ASSESSMENT:  CLINICAL IMPRESSION: Patient is a 72 y.o. female who was seen today for occupational therapy evaluation for UE deficits due to history of MS. Hx includes relapsing remitting MS and breast CA with mastectomy as well as seizures, chronic pain in B legs/arms and a lot of nerve pain. Patient currently presents below baseline level of functioning demonstrating functional deficits and impairments as noted below. Pt would benefit from skilled OT services in the outpatient setting to work on impairments as noted below to help pt return to PLOF as able.    PERFORMANCE DEFICITS: in functional skills including ADLs, coordination, dexterity, sensation, edema, ROM, strength, pain, flexibility, Fine motor control, Gross motor control, mobility, balance, endurance, decreased knowledge of use of DME, and UE functional use, cognitive skills including attention and memory, and psychosocial skills including coping strategies and routines and behaviors.   IMPAIRMENTS: are limiting patient from ADLs, rest and sleep, leisure, and social participation.   CO-MORBIDITIES: may have co-morbidities  that affects occupational performance. Patient will benefit from skilled OT to address  above impairments and improve overall function.  MODIFICATION OR ASSISTANCE TO COMPLETE EVALUATION: No modification of tasks or assist necessary to complete an evaluation.  OT OCCUPATIONAL PROFILE AND HISTORY: Problem focused assessment: Including review of records relating to presenting problem.  CLINICAL DECISION MAKING: LOW - limited treatment options, no task modification necessary  REHAB POTENTIAL: Good  EVALUATION COMPLEXITY: Low    PLAN:  OT FREQUENCY: 1-2x/week  OT DURATION: 6 weeks  PLANNED INTERVENTIONS: self care/ADL training, therapeutic exercise, therapeutic activity, neuromuscular re-education, balance training, functional mobility training, splinting, patient/family education, cognitive remediation/compensation, visual/perceptual remediation/compensation, energy conservation, coping strategies training, and DME and/or AE instructions  RECOMMENDED OTHER SERVICES: May need prescriptions for [facility] and new rollator.  Also may need eye exam.  CONSULTED AND AGREED WITH PLAN OF CARE: Patient and family member/caregiver  PLAN FOR NEXT SESSIONS: Functional outcome measure - update goals as appropriate. Further vision screening - update goals as appropriate. Assist with Rx for BSC/rollator replacement. MS Education ie) resources, AE, energy conservation Will need HEP ideas for UE ROM, coordination, strength and sensory awareness. Splint considerations for L pinkie finger.   Victorino Sparrow, OT 02/22/2023, 12:23 PM

## 2023-02-24 ENCOUNTER — Ambulatory Visit: Payer: Medicare Other | Admitting: Physical Therapy

## 2023-02-24 DIAGNOSIS — R278 Other lack of coordination: Secondary | ICD-10-CM | POA: Diagnosis not present

## 2023-02-24 DIAGNOSIS — R293 Abnormal posture: Secondary | ICD-10-CM | POA: Diagnosis not present

## 2023-02-24 DIAGNOSIS — R2689 Other abnormalities of gait and mobility: Secondary | ICD-10-CM | POA: Diagnosis not present

## 2023-02-24 DIAGNOSIS — M6281 Muscle weakness (generalized): Secondary | ICD-10-CM | POA: Diagnosis not present

## 2023-02-24 DIAGNOSIS — R2681 Unsteadiness on feet: Secondary | ICD-10-CM | POA: Diagnosis not present

## 2023-02-24 DIAGNOSIS — R29818 Other symptoms and signs involving the nervous system: Secondary | ICD-10-CM

## 2023-02-24 NOTE — Therapy (Signed)
OUTPATIENT PHYSICAL THERAPY NEURO TREATMENT   Patient Name: Selena Bender MRN: [id number] DOB:[birth year], 72 y.o., female Today's Date: 02/24/2023   PCP: Bethanie Dicker, NP REFERRING PROVIDER: Bethanie Dicker, NP  END OF SESSION:  PT End of Session - 02/24/23 1419     Visit Number 5    Number of Visits 17   with eval   Date for PT Re-Evaluation 04/28/23    Authorization Type Medicare    Progress Note Due on Visit 10    PT Start Time 1411   pt arrived late   PT Stop Time 1503    PT Time Calculation (min) 52 min    Equipment Utilized During Treatment Gait belt    Activity Tolerance Patient tolerated treatment well    Behavior During Therapy WFL for tasks assessed/performed                Past Medical History:  Diagnosis Date   Asthma    Breast cancer (HCC) 2014   Right   Chicken pox    CKD (chronic kidney disease), stage III (HCC)    COVID-19 virus infection 12/2020   Hypertension    Multiple sclerosis (HCC)    Seizures (HCC)    Past Surgical History:  Procedure Laterality Date   BREAST SURGERY     MASTECTOMY Right 2014   Patient Active Problem List   Diagnosis Date Noted   Spinal cord disease (HCC) 02/01/2023   Abnormal MRI, cervical spine (06/06/2020 & 11/13/2022) 02/01/2023   Demyelinating disease of central nervous system (HCC) 02/01/2023   Chronic radicular pain of lower extremity (S1) (Bilateral) 02/01/2023   Lower extremity burning pain 02/01/2023   Chronic low back pain (2ry area of Pain) (Bilateral) (R>L) w/ sciatica (Bilateral) 02/01/2023   Chronic hip pain (Bilateral) (L>R) 02/01/2023   Chronic upper extremity pain (intermittent) (Bilateral) 02/01/2023   Allergic rhinitis 01/14/2023   CKD (chronic kidney disease), stage III (HCC)    COVID-19 virus infection 12/2020   Myelopathy (HCC) 09/25/2020   History of right breast cancer 03/13/2020   Status post right mastectomy 03/13/2020   Abnormal gait 09/15/2018   Claustrophobia 09/15/2018    Hypertension 09/15/2018   Focal epilepsy (HCC) 09/15/2018   Multiple sclerosis (HCC) 09/15/2018   Paresthesia 09/15/2018   Chronic lower extremity pain (1ry area of Pain) (Bilateral) (R>L) 12/31/2015   Seizure (HCC) 12/31/2015   Neuropathic pain 06/02/2015   No advance directives 11/02/2012   Refusal of blood transfusion for reasons of conscience 11/02/2012   Malignant neoplasm of upper-outer quadrant of right breast in female, estrogen receptor positive (HCC) 11/01/2012    ONSET DATE: 01/14/2023 (referral date)  REFERRING DIAG: G35 (ICD-10-CM) - Multiple sclerosis (HCC) R26.9 (ICD-10-CM) - Abnormal gait M62.81 (ICD-10-CM) - Generalized muscle weakness  THERAPY DIAG:  Muscle weakness (generalized)  Unsteadiness on feet  Other symptoms and signs involving the nervous system  Other abnormalities of gait and mobility  Abnormal posture  Rationale for Evaluation and Treatment: Rehabilitation  SUBJECTIVE:  SUBJECTIVE STATEMENT: Pt reports that she had pain in her R arm and chest right before leaving for PT session, it resolved after a few minutes. No other acute changes since last visit. Pt brings in her old [place] knee cage today.  Pt accompanied by: self and family member daughter [name] (outside)  PERTINENT HISTORY: relapsing remitting MS and breast CA and seizures  PAIN:  Are you having pain? Yes: NPRS scale: 4-5/10 Pain location: legs, hips down into thighs Pain description: pins and needles, burning sensation Aggravating factors: hard to tell just gets bad sometimes Relieving factors: pain medication, sometimes movement  Took pain medicine before she came today.  PRECAUTIONS: Fall  WEIGHT BEARING RESTRICTIONS: No  FALLS: Has patient fallen in last 6 months? No, several near falls  though  LIVING ENVIRONMENT: Lives with: lives with their daughter [name] Lives in: House/apartment Stairs: No Has following equipment at home: Counselling psychologist, Environmental consultant - 4 wheeled, and shower chair *needs new rollator and needs a BSC  PLOF: Independent with basic ADLs, Independent with gait, Independent with transfers, Requires assistive device for independence, and Needs assistance with homemaking  PATIENT GOALS: "to help me move around a little bit better and improve my balance, less pain"  OBJECTIVE:   DIAGNOSTIC FINDINGS:  B hip xrays 02/01/23: FINDINGS: SI joints are patent. Pubic symphysis and rami appear intact. Mild bilateral hip degenerative changes with minimal spurring and joint space narrowing   IMPRESSION: Mild bilateral hip degenerative changes.  Lumbar spine xray 02/01/23: FINDINGS: Five non rib-bearing lumbar type vertebra. Lumbar alignment within normal limits. Vertebral body heights are maintained. No suspicious change in alignment with flexion or extension. Mild disc space narrowing L2-L3 and L4-L5. Moderate facet degenerative changes worst at L4-L5 and L5-S1.   IMPRESSION: Multilevel degenerative changes as described above.  COGNITION: Overall cognitive status: Impaired "I'm not as sharp as I used to be"   SENSATION: Decreased light touch in R lateral knee  EDEMA:  Swelling in L knee most of the time, occasionally in L ankle  POSTURE: rounded shoulders, forward head, and posterior pelvic tilt  LOWER EXTREMITY ROM:     Passive  Right Eval Left Eval  Hip flexion   [facility]   [facility]  Hip extension    Hip abduction    Hip adduction    Hip internal rotation    Hip external rotation    Knee flexion    Knee extension    Ankle dorsiflexion    Ankle plantarflexion    Ankle inversion    Ankle eversion     (Blank rows = not tested)  LOWER EXTREMITY MMT:    MMT Right Eval Left Eval  Hip flexion 5 2-  Hip extension    Hip abduction    Hip  adduction    Hip internal rotation    Hip external rotation    Knee flexion 5 2-  Knee extension 5 3  Ankle dorsiflexion 4 3  Ankle plantarflexion    Ankle inversion    Ankle eversion    (Blank rows = not tested)   TODAY'S TREATMENT:  [employer] Assisted pt with fixing straps on her Swedish knee cage with new velcro for better fit.   Gait Gait pattern: decreased hip/knee flexion- Left and decreased ankle dorsiflexion- Left Distance walked: 115 ft Assistive device utilized: Quad cane small base Level of assistance: SBA Comments: with [place] knee cage, no knee flexion or hyperextension noted on LLE  Gait pattern: decreased hip/knee flexion- Left and decreased ankle dorsiflexion- Left Distance walked: 115 ft Assistive device utilized: Quad cane small base Level of assistance: SBA Comments: without any bracing, no knee flexion or DF just lifts leg and scoots it forwards  Gait pattern: decreased hip/knee flexion- Left and decreased ankle dorsiflexion- Left Distance walked: 115 ft Assistive device utilized: Quad cane small base Level of assistance: SBA Comments: with [place] PLS and does have improved DF but makes shoe feel too big  Gait pattern: decreased hip/knee flexion- Left and decreased ankle dorsiflexion- Left Distance walked: 115 ft Assistive device utilized: Quad cane small base Level of assistance: SBA Comments: with [place] PLS with medial strut, doesn't have as good DF as with [place]  Will continue to further assess bracing needs and if pt even needs an AFO at this point. Educated pt that she does not really need to wear her [place] knee cage as she does not exhibit hyperextension at this point.    PATIENT EDUCATION: Education details: Continue practicing sit to stands at home  Person educated: Patient Education method: Programmer, multimedia,  Facilities manager, and Verbal cues Education comprehension: verbalized understanding, returned demonstration, verbal cues required, and needs further education  HOME EXERCISE PROGRAM: From prior POC: Access Code: [email or web address] URL: [email or web address] Date: 02/15/2023 Prepared by: Peter Congo  Exercises - Sit to Stand Without Arm Support  - 1 x daily - 7 x weekly - 1 sets - 10 reps - Standing March with Counter Support  - 1 x daily - 7 x weekly - 1 sets - 10 reps - Standing Hip Abduction with Counter Support  - 1 x daily - 7 x weekly - 1 sets - 10 reps - Standing Hip Extension with Counter Support  - 1 x daily - 7 x weekly - 1 sets - 10 reps - Side Stepping with Counter Support  - 1 x daily - 7 x weekly - 1 sets - 10 reps - Seated Ankle Pumps  - 1 x daily - 7 x weekly - 3 sets - 10 reps - Standing Ankle Dorsiflexion Stretch  - 1 x daily - 7 x weekly - 3 sets - 10 reps - 30 second hold  - Supine March  - 1 x daily - 7 x weekly - 3 sets - 5-10 reps - Supine Bridge  - 1 x daily - 7 x weekly - 1 sets - 10 reps  Black = reviewed  Red = previous POC, not reviewed yet  GOALS: Goals reviewed with patient? Yes  SHORT TERM GOALS: Target date: 03/03/2023  Pt will be independent with initial HEP for improved strength, balance, transfers and gait. Baseline: Goal status: INITIAL  2.  Pt will improve 5 x STS to less than or equal to 18 seconds to demonstrate improved functional strength and transfer efficiency with BUE support as needed. Baseline: 21.15 sec with heavy UE reliance on arms of chair (6/27) Goal status: INITIAL  3.  Pt will improve normal TUG to less than or equal to 35 seconds for improved functional mobility and decreased fall risk with LRAD. Baseline: 40.37 sec with SBQC (6/27) Goal status: INITIAL  4.  Pt will improve gait velocity to at least 1.0 ft/sec for improved gait efficiency and performance at mod I level with LRAD. Baseline: 0.83 ft/sec with SBQC  (6/27) Goal status: INITIAL   LONG TERM GOALS: Target date: 04/07/2023   Pt will be independent with final HEP for improved strength, balance, transfers and gait. Baseline:  Goal status: INITIAL  2.  Pt will improve 5 x STS to less than or equal to 15 seconds to demonstrate improved functional strength and transfer efficiency with decreased BUE support.  Baseline: 21.15 sec with heavy UE reliance on arms of chair (6/27) Goal status: INITIAL  3.  Pt will improve normal TUG to less than or equal to 30 seconds for improved functional mobility and decreased fall risk with LRAD. Baseline: 40.37 sec with SBQC (6/27) Goal status: INITIAL  4.  Pt will improve gait velocity to at least 1.25 ft/sec for improved gait efficiency and performance at mod I level with LRAD Baseline: 0.83 ft/sec with SBQC (6/27) Goal status: INITIAL  5.  Pt will improve her score on the MSIS-29 to </= 95 points to demonstrate decreased disability level. Baseline: 105/145 (6/27) Goal status: INITIAL    ASSESSMENT:  CLINICAL IMPRESSION: Emphasis of skilled PT session on fixing straps on pt's [place] knee cage and then trialing various AFOs to determine best fit to assist with decreased DF during gait. Difficulty determining best brace for patient at this time as she also continues to exhibit decreased knee flexion during gait which is contributing to difficulty advancing LE. Pt could benefit from trial of Bioness in future sessions. Pt continues to benefit from skilled therapy services to work towards improving her strength, endurance, and balance in order to decrease her fall risk. Continue POC.     OBJECTIVE IMPAIRMENTS: Abnormal gait, cardiopulmonary status limiting activity, decreased activity tolerance, decreased balance, decreased endurance, decreased mobility, difficulty walking, decreased strength, increased edema, impaired sensation, impaired UE functional use, and pain.   ACTIVITY LIMITATIONS: carrying,  lifting, bending, standing, squatting, stairs, transfers, and bed mobility  PARTICIPATION LIMITATIONS: meal prep, cleaning, laundry, driving, and community activity  PERSONAL FACTORS: Age, Sex, Time since onset of injury/illness/exacerbation, Transportation, and 1-2 comorbidities: RRMS, cancer, seizures  are also affecting patient's functional outcome.   REHAB POTENTIAL: Fair time since onset of MS, previous bouts of therapy  CLINICAL DECISION MAKING: Stable/uncomplicated  EVALUATION COMPLEXITY: Moderate  PLAN:  PT FREQUENCY: 2x/week  PT DURATION: 8 weeks  PLANNED INTERVENTIONS: Therapeutic exercises, Therapeutic activity, Neuromuscular re-education, Balance training, Gait training, Patient/Family education, Self Care, Joint mobilization, Stair training, Vestibular training, Canalith repositioning, Visual/preceptual remediation/compensation, Orthotic/Fit training, DME instructions, Aquatic Therapy, Dry Needling, Electrical stimulation, Cryotherapy, Moist heat, Taping, Manual therapy, and Re-evaluation  PLAN FOR NEXT SESSION: Update HEP for more challenging LLE strengthening and NMR, balance, endurance, gait training with rollator, (Pt's old genu recurvatum brace is in [employer])  Could also try estim or bioness in future sessions (let her know when so she can wear shorts)      Peter Congo, PT, DPT, CSRS  02/24/2023, 3:04 PM

## 2023-03-01 ENCOUNTER — Ambulatory Visit: Payer: Medicare Other | Admitting: Occupational Therapy

## 2023-03-01 ENCOUNTER — Ambulatory Visit: Payer: Medicare Other | Admitting: Physical Therapy

## 2023-03-01 DIAGNOSIS — R2689 Other abnormalities of gait and mobility: Secondary | ICD-10-CM

## 2023-03-01 DIAGNOSIS — R29818 Other symptoms and signs involving the nervous system: Secondary | ICD-10-CM

## 2023-03-01 DIAGNOSIS — M6281 Muscle weakness (generalized): Secondary | ICD-10-CM

## 2023-03-01 DIAGNOSIS — R2681 Unsteadiness on feet: Secondary | ICD-10-CM | POA: Diagnosis not present

## 2023-03-01 DIAGNOSIS — R278 Other lack of coordination: Secondary | ICD-10-CM

## 2023-03-01 DIAGNOSIS — R293 Abnormal posture: Secondary | ICD-10-CM | POA: Diagnosis not present

## 2023-03-01 NOTE — Therapy (Signed)
OUTPATIENT PHYSICAL THERAPY NEURO TREATMENT   Patient Name: Selena Bender MRN: [id number] DOB:03-17-1951, 72 y.o., female Today's Date: 03/01/2023   PCP: Selena Dicker, NP REFERRING PROVIDER: Bethanie Dicker, NP  END OF SESSION:  PT End of Session - 03/01/23 1117     Visit Number 6    Number of Visits 17   with eval   Date for PT Re-Evaluation 04/28/23    Authorization Type Medicare    Progress Note Due on Visit 10    PT Start Time 1117   pt arrived late   PT Stop Time 1146    PT Time Calculation (min) 29 min    Equipment Utilized During Treatment Gait belt    Activity Tolerance Patient tolerated treatment well    Behavior During Therapy WFL for tasks assessed/performed                 Past Medical History:  Diagnosis Date   Asthma    Breast cancer (HCC) 2014   Right   Chicken pox    CKD (chronic kidney disease), stage III (HCC)    COVID-19 virus infection 12/2020   Hypertension    Multiple sclerosis (HCC)    Seizures (HCC)    Past Surgical History:  Procedure Laterality Date   BREAST SURGERY     MASTECTOMY Right 2014   Patient Active Problem List   Diagnosis Date Noted   Spinal cord disease (HCC) 02/01/2023   Abnormal MRI, cervical spine (06/06/2020 & 11/13/2022) 02/01/2023   Demyelinating disease of central nervous system (HCC) 02/01/2023   Chronic radicular pain of lower extremity (S1) (Bilateral) 02/01/2023   Lower extremity burning pain 02/01/2023   Chronic low back pain (2ry area of Pain) (Bilateral) (R>L) w/ sciatica (Bilateral) 02/01/2023   Chronic hip pain (Bilateral) (L>R) 02/01/2023   Chronic upper extremity pain (intermittent) (Bilateral) 02/01/2023   Allergic rhinitis 01/14/2023   CKD (chronic kidney disease), stage III (HCC)    COVID-19 virus infection 12/2020   Myelopathy (HCC) 09/25/2020   History of right breast cancer 03/13/2020   Status post right mastectomy 03/13/2020   Abnormal gait 09/15/2018   Claustrophobia 09/15/2018    Hypertension 09/15/2018   Focal epilepsy (HCC) 09/15/2018   Multiple sclerosis (HCC) 09/15/2018   Paresthesia 09/15/2018   Chronic lower extremity pain (1ry area of Pain) (Bilateral) (R>L) 12/31/2015   Seizure (HCC) 12/31/2015   Neuropathic pain 06/02/2015   No advance directives 11/02/2012   Refusal of blood transfusion for reasons of conscience 11/02/2012   Malignant neoplasm of upper-outer quadrant of right breast in female, estrogen receptor positive (HCC) 11/01/2012    ONSET DATE: 01/14/2023 (referral date)  REFERRING DIAG: G35 (ICD-10-CM) - Multiple sclerosis (HCC) R26.9 (ICD-10-CM) - Abnormal gait M62.81 (ICD-10-CM) - Generalized muscle weakness  THERAPY DIAG:  Muscle weakness (generalized)  Unsteadiness on feet  Other abnormalities of gait and mobility  Abnormal posture  Rationale for Evaluation and Treatment: Rehabilitation  SUBJECTIVE:  SUBJECTIVE STATEMENT: Pt reports she is not doing as well today, she felt ok when she woke up but then has had increased pain as the day progressed. Pt rates pain as 7/10 in her legs, feels stiff and is painful to move. Pt was doing well for the past few days until this started going on.  Pt accompanied by: self and family member daughter Selena Bender (outside)  PERTINENT HISTORY: relapsing remitting MS and breast CA and seizures  PAIN:  Are you having pain? Yes: NPRS scale: 4-5/10 Pain location: legs, hips down into thighs Pain description: pins and needles, burning sensation Aggravating factors: hard to tell just gets bad sometimes Relieving factors: pain medication, sometimes movement  Took pain medicine before she came today.  PRECAUTIONS: Fall  WEIGHT BEARING RESTRICTIONS: No  FALLS: Has patient fallen in last 6 months? No, several near falls  though  LIVING ENVIRONMENT: Lives with: lives with their daughter Selena Bender Lives in: House/apartment Stairs: No Has following equipment at home: Counselling psychologist, Environmental consultant - 4 wheeled, and shower chair *needs new rollator and needs a BSC  PLOF: Independent with basic ADLs, Independent with gait, Independent with transfers, Requires assistive device for independence, and Needs assistance with homemaking  PATIENT GOALS: "to help me move around a little bit better and improve my balance, less pain"  OBJECTIVE:   DIAGNOSTIC FINDINGS:  B hip xrays 02/01/23: FINDINGS: SI joints are patent. Pubic symphysis and rami appear intact. Mild bilateral hip degenerative changes with minimal spurring and joint space narrowing   IMPRESSION: Mild bilateral hip degenerative changes.  Lumbar spine xray 02/01/23: FINDINGS: Five non rib-bearing lumbar type vertebra. Lumbar alignment within normal limits. Vertebral body heights are maintained. No suspicious change in alignment with flexion or extension. Mild disc space narrowing L2-L3 and L4-L5. Moderate facet degenerative changes worst at L4-L5 and L5-S1.   IMPRESSION: Multilevel degenerative changes as described above.  COGNITION: Overall cognitive status: Impaired "I'm not as sharp as I used to be"   SENSATION: Decreased light touch in R lateral knee  EDEMA:  Swelling in L knee most of the time, occasionally in L ankle  POSTURE: rounded shoulders, forward head, and posterior pelvic tilt  LOWER EXTREMITY ROM:     Passive  Right Eval Left Eval  Hip flexion   [facility]   [facility]  Hip extension    Hip abduction    Hip adduction    Hip internal rotation    Hip external rotation    Knee flexion    Knee extension    Ankle dorsiflexion    Ankle plantarflexion    Ankle inversion    Ankle eversion     (Blank rows = not tested)  LOWER EXTREMITY MMT:    MMT Right Eval Left Eval  Hip flexion 5 2-  Hip extension    Hip abduction    Hip  adduction    Hip internal rotation    Hip external rotation    Knee flexion 5 2-  Knee extension 5 3  Ankle dorsiflexion 4 3  Ankle plantarflexion    Ankle inversion    Ankle eversion    (Blank rows = not tested)   TODAY'S TREATMENT:  Gait Gait pattern:  L hip ER, decreased hip/knee flexion- Left, and decreased ankle dorsiflexion- Left Distance walked: 115 ft Assistive device utilized: Quad cane small base Level of assistance: CGA Comments: to assess L knee mechanics again without any bracing, no hyperextension noted; pt educated that she no longer needs to bring her Swedish knee cage to therapy sessions and does not currently need to wear it at home as she is not exhibiting hyperextension with gait    TherEx To work on BLE strengthening:  At bottom of stairs with 2 handrails: 6" step taps with RLE x 10 reps 12" step taps with RLE x 10 reps Unable to lift LLE up to 6" step without circumducting hip  In // bars with BUE support: 2" step taps with LLE x 10 reps forwards 2" step taps with LLE x 10 reps laterally 2" eccentric step downs with LLE 2 x 10 reps 2" eccentric step downs with RLE x 10 reps with mod A for balance, increase in pain in L knee with locking/unlocking of joint   PATIENT EDUCATION: Education details: Continue practicing sit to stands at home, continue working on [employer] Person educated: Patient Education method: Programmer, multimedia, Facilities manager, and Verbal cues Education comprehension: verbalized understanding, returned demonstration, verbal cues required, and needs further education  HOME EXERCISE PROGRAM: From prior POC: Access Code: [email or web address] URL: [email or web address] Date: 02/15/2023 Prepared by: Peter Congo  Exercises - Sit to Stand Without Arm Support  - 1 x daily - 7 x weekly - 1 sets - 10 reps - Standing  March with Counter Support  - 1 x daily - 7 x weekly - 1 sets - 10 reps - Standing Hip Abduction with Counter Support  - 1 x daily - 7 x weekly - 1 sets - 10 reps - Standing Hip Extension with Counter Support  - 1 x daily - 7 x weekly - 1 sets - 10 reps - Side Stepping with Counter Support  - 1 x daily - 7 x weekly - 1 sets - 10 reps - Seated Ankle Pumps  - 1 x daily - 7 x weekly - 3 sets - 10 reps - Standing Ankle Dorsiflexion Stretch  - 1 x daily - 7 x weekly - 3 sets - 10 reps - 30 second hold  - Supine March  - 1 x daily - 7 x weekly - 3 sets - 5-10 reps - Supine Bridge  - 1 x daily - 7 x weekly - 1 sets - 10 reps  Black = reviewed  Red = previous POC, not reviewed yet  GOALS: Goals reviewed with patient? Yes  SHORT TERM GOALS: Target date: 03/03/2023  Pt will be independent with initial HEP for improved strength, balance, transfers and gait. Baseline: Goal status: INITIAL  2.  Pt will improve 5 x STS to less than or equal to 18 seconds to demonstrate improved functional strength and transfer efficiency with BUE support as needed. Baseline: 21.15 sec with heavy UE reliance on arms of chair (6/27) Goal status: INITIAL  3.  Pt will improve normal TUG to less than or equal to 35 seconds for improved functional mobility and decreased fall risk with LRAD. Baseline: 40.37 sec with SBQC (6/27) Goal status: INITIAL  4.  Pt will improve gait velocity to at least 1.0 ft/sec for improved gait efficiency and performance at mod I level with LRAD. Baseline: 0.83 ft/sec with SBQC (6/27) Goal status: INITIAL   LONG TERM GOALS: Target date: 04/07/2023   Pt  will be independent with final HEP for improved strength, balance, transfers and gait. Baseline:  Goal status: INITIAL  2.  Pt will improve 5 x STS to less than or equal to 15 seconds to demonstrate improved functional strength and transfer efficiency with decreased BUE support.  Baseline: 21.15 sec with heavy UE reliance on arms of  chair (6/27) Goal status: INITIAL  3.  Pt will improve normal TUG to less than or equal to 30 seconds for improved functional mobility and decreased fall risk with LRAD. Baseline: 40.37 sec with SBQC (6/27) Goal status: INITIAL  4.  Pt will improve gait velocity to at least 1.25 ft/sec for improved gait efficiency and performance at mod I level with LRAD Baseline: 0.83 ft/sec with SBQC (6/27) Goal status: INITIAL  5.  Pt will improve her score on the MSIS-29 to </= 95 points to demonstrate decreased disability level. Baseline: 105/145 (6/27) Goal status: INITIAL    ASSESSMENT:  CLINICAL IMPRESSION: Session limited by patient's late arrival as well as pain. Emphasis of skilled PT session on working on assessing gait again without any bracing and working on BLE strengthening. Pt exhibits difficulty unlocking and locking her L knee with increase in pain with this, continues to exhibit no knee motion during gait. Pt continues to benefit from skilled therapy services to work towards increasing her safety and independence with functional mobility. Continue POC.     OBJECTIVE IMPAIRMENTS: Abnormal gait, cardiopulmonary status limiting activity, decreased activity tolerance, decreased balance, decreased endurance, decreased mobility, difficulty walking, decreased strength, increased edema, impaired sensation, impaired UE functional use, and pain.   ACTIVITY LIMITATIONS: carrying, lifting, bending, standing, squatting, stairs, transfers, and bed mobility  PARTICIPATION LIMITATIONS: meal prep, cleaning, laundry, driving, and community activity  PERSONAL FACTORS: Age, Sex, Time since onset of injury/illness/exacerbation, Transportation, and 1-2 comorbidities: RRMS, cancer, seizures  are also affecting patient's functional outcome.   REHAB POTENTIAL: Fair time since onset of MS, previous bouts of therapy  CLINICAL DECISION MAKING: Stable/uncomplicated  EVALUATION COMPLEXITY:  Moderate  PLAN:  PT FREQUENCY: 2x/week  PT DURATION: 8 weeks  PLANNED INTERVENTIONS: Therapeutic exercises, Therapeutic activity, Neuromuscular re-education, Balance training, Gait training, Patient/Family education, Self Care, Joint mobilization, Stair training, Vestibular training, Canalith repositioning, Visual/preceptual remediation/compensation, Orthotic/Fit training, DME instructions, Aquatic Therapy, Dry Needling, Electrical stimulation, Cryotherapy, Moist heat, Taping, Manual therapy, and Re-evaluation  PLAN FOR NEXT SESSION: Update HEP for more challenging LLE strengthening and NMR, balance, endurance, gait training with rollator, (Pt's old genu recurvatum brace is in [employer])  Could also try estim or bioness in future sessions (let her know when so she can wear shorts), assess STGs!      Peter Congo, PT, DPT, CSRS  03/01/2023, 11:47 AM

## 2023-03-01 NOTE — Therapy (Signed)
OUTPATIENT OCCUPATIONAL THERAPY NEURO TREATMENT  Patient Name: Lucely Leard MRN: [id number] DOB:10/29/1950, 72 y.o., female Today's Date: 03/01/2023  PCP: Bethanie Dicker, NP REFERRING PROVIDER: Bethanie Dicker, NP   END OF SESSION:  OT End of Session - 03/01/23 1202     Visit Number 2    Number of Visits 12   + evaluation   Date for OT Re-Evaluation 04/15/23    Authorization Type Medicare and [employer] VL [id number]    Progress Note Due on Visit 10    OT Start Time 1210    OT Stop Time 1255    OT Time Calculation (min) 45 min    Equipment Utilized During Treatment FM materials, Oval 8 finger splint    Activity Tolerance Patient tolerated treatment well    Behavior During Therapy WFL for tasks assessed/performed             Past Medical History:  Diagnosis Date   Asthma    Breast cancer (HCC) 2014   Right   Chicken pox    CKD (chronic kidney disease), stage III (HCC)    COVID-19 virus infection 12/2020   Hypertension    Multiple sclerosis (HCC)    Seizures (HCC)    Past Surgical History:  Procedure Laterality Date   BREAST SURGERY     MASTECTOMY Right 2014   Patient Active Problem List   Diagnosis Date Noted   Spinal cord disease (HCC) 02/01/2023   Abnormal MRI, cervical spine (06/06/2020 & 11/13/2022) 02/01/2023   Demyelinating disease of central nervous system (HCC) 02/01/2023   Chronic radicular pain of lower extremity (S1) (Bilateral) 02/01/2023   Lower extremity burning pain 02/01/2023   Chronic low back pain (2ry area of Pain) (Bilateral) (R>L) w/ sciatica (Bilateral) 02/01/2023   Chronic hip pain (Bilateral) (L>R) 02/01/2023   Chronic upper extremity pain (intermittent) (Bilateral) 02/01/2023   Allergic rhinitis 01/14/2023   CKD (chronic kidney disease), stage III (HCC)    COVID-19 virus infection 12/2020   Myelopathy (HCC) 09/25/2020   History of right breast cancer 03/13/2020   Status post right mastectomy 03/13/2020   Abnormal gait 09/15/2018    Claustrophobia 09/15/2018   Hypertension 09/15/2018   Focal epilepsy (HCC) 09/15/2018   Multiple sclerosis (HCC) 09/15/2018   Paresthesia 09/15/2018   Chronic lower extremity pain (1ry area of Pain) (Bilateral) (R>L) 12/31/2015   Seizure (HCC) 12/31/2015   Neuropathic pain 06/02/2015   No advance directives 11/02/2012   Refusal of blood transfusion for reasons of conscience 11/02/2012   Malignant neoplasm of upper-outer quadrant of right breast in female, estrogen receptor positive (HCC) 11/01/2012    ONSET DATE: 02/04/2023  REFERRING DIAG: N02.725 (ICD-10-CM) - Left hand weakness G35 (ICD-10-CM) - Multiple sclerosis (HCC)  THERAPY DIAG:  Other lack of coordination  Muscle weakness (generalized)  Other symptoms and signs involving the nervous system  Rationale for Evaluation and Treatment: Rehabilitation  SUBJECTIVE:   SUBJECTIVE STATEMENT: Patient reported when she got up this morning she was feeling okay but as the day has progressed, she has been feeling more pain in arms and legs.  Upon inquiry, she noted that it did happen after her shower this morning.  Patient reports that she handles heat fine but is more affected by the cold.  Patient reports she belongs to a couple of MS groups online including one for women.  Pt accompanied by: self and family member - daughter Dannielle Huh  PERTINENT HISTORY:   Pt is a 72 yr old AA female with hx  of relapsing remitting MS and breast CA with mastectomy as well as seizures, chronic pain in B legs/arms and a lot of nerve pain.  PRECAUTIONS: None  WEIGHT BEARING RESTRICTIONS: No  PAIN:  Are you having pain? Yes: NPRS scale: 0 earlier today - getting up around 6 AM but it started to increased around 9 AM and is now at 6/10 Pain location: legs all the way to feet and arms all the way to her hands Pain description: nerve pains - pins and needles Aggravating factors: hard to say - sometimes movement is fine but sometimes it makes her  feel better Relieving factors: hard to say - sometimes movement help sand sometimes it makes it worse. She took medication this morning before she came to therapy but it didn't help  FALLS: Has patient fallen in last 6 months? No  LIVING ENVIRONMENT: Lives with: lives with their daughter Lives in: Apartment Stairs: No Has following equipment at home: Counselling psychologist, Environmental consultant - 4 wheeled, shower chair, Grab bars, and has grab bar that needs to be installed as the suction grab bar came off the wall during a shower a few months ago - also may need elevated BSC  PLOF: Independent with basic ADLs, Independent with household mobility with device, and Needs assistance with homemaking  PATIENT GOALS: Use my hands better.  OBJECTIVE:   HAND DOMINANCE: Right  ADLs: Overall ADLs: Pt generally MI Transfers/ambulation related to ADLs: MI with AE Eating: I Grooming: Gets hair/nails done professionally UB Dressing: Sits down to get dressed LB Dressing: Able to do it with a little bit of a struggle but "I can still do it." Toileting: MI Bathing: Showers 2x/week Tub Shower transfers: Supervision - needs grab bar replaced Equipment: Shower seat with back, Walk in shower, and Reacher  IADLs: Shopping: I go shopping sometimes (does not drive). Light housekeeping: I do my own laundry Meal Prep: Daughter does most of it. Every now and then I get in the kitchen. Community mobility: Uses cane in public and walker at home (needs a new rollator due to poor brakes etc) - may need rx for new walker Medication management: Self managed with pill bottles - only uses a pill box only when she goes away/vacation Financial management: Self managed Handwriting: 50% legible   MOBILITY STATUS: Needs Assist: uses AE - rollator at home or cane in public  POSTURE COMMENTS:  No Significant postural limitations Sitting balance: WFL  ACTIVITY TOLERANCE: Activity tolerance: Patient reports only doing 1  thing/day ie) shopping, appt or getting nails done.  Needs to take breaks ie) when doing her hair.  FUNCTIONAL OUTCOME MEASURES: TBD  UPPER EXTREMITY ROM:    Active ROM Right eval Left eval  Shoulder flexion [facility]   Shoulder abduction    Shoulder adduction    Shoulder extension    Shoulder internal rotation    Shoulder external rotation    Elbow flexion    Elbow extension  Decreased end range  Wrist flexion    Wrist extension    Wrist ulnar deviation    Wrist radial deviation    Wrist pronation    Wrist supination    (Blank rows = not tested)  UPPER EXTREMITY MMT:     MMT Right eval Left eval  Shoulder flexion 4/5 3/5  Shoulder abduction    Shoulder adduction    Shoulder extension    Shoulder internal rotation    Shoulder external rotation    Middle trapezius  Lower trapezius    Elbow flexion    Elbow extension    Wrist flexion    Wrist extension    Wrist ulnar deviation    Wrist radial deviation    Wrist pronation    Wrist supination    (Blank rows = not tested)  HAND FUNCTION: Grip strength: Right: 33.2, 44.9, 35.0  lbs; Left: 19.8, 23.5, 21.8 lbs Evaluation Average: Right 37.7 lbs Left 21.7 lbs  COORDINATION: 9 Hole Peg test: Right: 47.93 sec; Left: 2:00:03 sec Box and Blocks:  Right 22 blocks, Left 18 blocks  SENSATION: Light touch: Impaired  Patient reports R side intact L palmar surface a bit dull and tingly in comparison.  EDEMA: NS  MUSCLE TONE:   COGNITION: Overall cognitive status:  Patient does report some changes in memory  VISION: Subjective report: L eye wanders and doesn't focus (does not like to look in the mirror due to changes which are also noted and pointed out by other family members) Baseline vision: Bifocals Visual history: NA  VISION ASSESSMENT: Impaired Tracking/Visual pursuits: Decreased smoothness with horizontal tracking  Patient has difficulty with following activities due to following visual impairments: eye  wanders  PERCEPTION: Not tested  PRAXIS: Not tested  OBSERVATIONS: Patient is a well groomed lady who regularly has her nails manicured.  She has L pinkie finger often held in a tight hook position often with difficulty extending it actively.  Awkward positioning noted with pegboard test with L arm ie) alternating between turning wrist pronated/supinated at times.    TODAY'S TREATMENT:                                                                                                                              DATE:    Coordination Activities  Demonstrated and assisted patient to perform the following activities for daily with both hand(s).  Flip cards 1 at a time as fast as you can. Pick up coins, buttons, marbles, dried beans/pasta of different sizes and place in container. Pick up coins and place in container or coin bank. Pick up coins and stack. Pick up coins one at a time until you get 5-10 in your hand, then move coins from palm to fingertips to stack one at a time. Twirl pen between fingers. ***   PATIENT EDUCATION: Education details: Coordination activities Person educated: Patient Education method: Explanation, Demonstration, Tactile cues, Verbal cues, and Handouts Education comprehension: verbalized understanding, returned demonstration, verbal cues required, tactile cues required, and needs further education  HOME EXERCISE PROGRAM: 03/01/23: Coordination activities with images provided   GOALS: Goals reviewed with patient? Yes   GOALS:  SHORT TERM GOALS: Target date: 03/17/2023    Patient will demonstrate BUE HEP (ROM, strength & coordination) with 25% verbal cues or less for proper execution. Baseline: not yet initiated Goal status: INITIAL  2.  Pt will verbalize understanding of adapted strategies/equipment to maximize safety and independence with ADLs/IADLs. Baseline: not yet initiated  Goal status: INITIAL  3.  Patient will demonstrate at least 40 lbs R  grip and 25 lbs L grip strength as needed to open jars and other containers.  Baseline: Evaluation Average: Right 37.7 lbs Left 21.7 lbs  Goal statues: INITIAL  4. Patient will be aware of sensory stimulation and compensatory strategies for diminished sensation.  Baseline: Patient reports R side intact L palmar surface a bit dull and tingly in comparison.  Goal Status: INITIAL  5.  Pt will be able to place at least 5 more blocks with each hand with completion of Box and Blocks test. Baseline: Box and Blocks:  Right 22 blocks, Left 18 blocks Goal status: INITIAL  6.  Pt will demonstrate use of binder for information organization of HEPs, MS resources and medical records. Baseline: not yet initiated - daughter present at eval for recommendation Goal status: INITIAL  LONG TERM GOALS: Target date: 04/08/2023    Pt will verbalize understanding (or locate information in info binder) re: ways to prevent future MS related complications and MS community resources. Baseline: not yet initiated Goal status: INITIAL  2.  Pt will verbalize understanding of ways to keep thinking skills sharp and ways to compensate for STM changes in the future. Baseline: not yet initiated Goal status: INITIAL  3.  Pt will demonstrate improved fine motor coordination for ADLs as evidenced by decreasing 9 hole peg test score for BUE by 10-15 secs. Baseline: 9 Hole Peg test: Right: 47.93 sec; Left: 2:00:03 sec Goal status: INITIAL  4.  Pt will demonstrate improved positioning of L pinkie finger through proper day and/or nighttime splinting as deemed effective, comfortable and appropriate. Baseline: poor active extension of PIP joint Goal status: INITIAL      ASSESSMENT:  CLINICAL IMPRESSION: Patient is a 72 y.o. female who was seen today for occupational therapy evaluation for UE deficits due to history of MS. Hx includes relapsing remitting MS and breast CA with mastectomy as well as seizures, chronic pain in B  legs/arms and a lot of nerve pain. Patient currently presents below baseline level of functioning demonstrating functional deficits and impairments as noted below. Pt would benefit from skilled OT services in the outpatient setting to work on impairments as noted below to help pt return to PLOF as able.    PERFORMANCE DEFICITS: in functional skills including ADLs, coordination, dexterity, sensation, edema, ROM, strength, pain, flexibility, Fine motor control, Gross motor control, mobility, balance, endurance, decreased knowledge of use of DME, and UE functional use, cognitive skills including attention and memory, and psychosocial skills including coping strategies and routines and behaviors.   IMPAIRMENTS: are limiting patient from ADLs, rest and sleep, leisure, and social participation.   CO-MORBIDITIES: may have co-morbidities  that affects occupational performance. Patient will benefit from skilled OT to address above impairments and improve overall function.  REHAB POTENTIAL: Good  PLAN:  OT FREQUENCY: 1-2x/week  OT DURATION: 6 weeks  PLANNED INTERVENTIONS: self care/ADL training, therapeutic exercise, therapeutic activity, neuromuscular re-education, balance training, functional mobility training, splinting, patient/family education, cognitive remediation/compensation, visual/perceptual remediation/compensation, energy conservation, coping strategies training, and DME and/or AE instructions  RECOMMENDED OTHER SERVICES: May need prescriptions for [facility] and new rollator.  Also may need eye exam.  CONSULTED AND AGREED WITH PLAN OF CARE: Patient and family member/caregiver  PLAN FOR NEXT SESSIONS: Functional outcome measure - update goals as appropriate. Further vision screening - update goals as appropriate. Assist with Rx for BSC/rollator replacement. MS Education ie) resources, AE, energy conservation  Will need HEP ideas for UE ROM, coordination, strength and sensory awareness. Splint  considerations for L pinkie finger.   Victorino Sparrow, OT 03/01/2023, 1:11 PM

## 2023-03-01 NOTE — Progress Notes (Unsigned)
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  Procedure Orders    No procedure(s) ordered today   Lab Orders  No laboratory test(s) ordered today   Imaging Orders  No imaging studies ordered today   Referral Orders  No referral(s) requested today    Pharmacological management:  Opioid Analgesics: I will not be prescribing any opioids at this time Membrane stabilizer: I will not be prescribing any at this time Muscle relaxant: I will not be prescribing any at this time NSAID: I will not be prescribing any at this time Other analgesic(s): I will not be prescribing any at this time      Interventional Therapies  Risk Factors  Considerations:  MS  CKD  Seizure disorder (focal epilepsy)  HTN  claustrophobia  Hx Right Breast CA (Hx Mastectomy)     Planned  Pending:   Diagnostic right L4-5 vs. L5-S1 LESI #1    Under consideration:   Diagnostic right L4-5 vs. L5-S1 LESI #1     Completed:   None at this time   Therapeutic  Palliative (PRN) options:   None established   Completed by other providers:   None reported       Provider-requested follow-up: No follow-ups on file. Recent Visits Date Type Provider Dept  02/01/23 Office Visit Delano Metz, MD [facility]  Showing recent visits within past 90 days and meeting all other requirements Future Appointments Date Type Provider Dept  03/02/23 Appointment Delano Metz, MD [facility]  Showing future appointments within next 90 days and meeting all other requirements   Primary Care Physician: Bethanie Dicker, NP  Duration of encounter: *** minutes.  Total time on encounter, as per AMA guidelines included both the face-to-face and non-face-to-face time personally spent by the physician and/or other qualified health care professional(s) on the day of the encounter (includes time in activities that require the physician or other qualified health care professional and does not include time in activities normally performed by clinical staff). Physician's time may include the following activities when performed: Preparing to see the patient (e.g., pre-charting review of records, searching for previously ordered imaging, lab work, and nerve conduction tests) Review of prior analgesic pharmacotherapies. Reviewing PMP Interpreting ordered tests (e.g., lab work, imaging, nerve conduction tests) Performing post-procedure evaluations, including interpretation of diagnostic procedures Obtaining and/or reviewing separately obtained history Performing a medically appropriate examination and/or evaluation Counseling and educating the patient/family/caregiver Ordering medications, tests, or procedures Referring and communicating with other health care professionals (when not separately reported) Documenting clinical information in the electronic or other health record Independently  interpreting results (not separately reported) and communicating results to the patient/ family/caregiver Care coordination (not separately reported)  Note by: Oswaldo Done, MD (TTS technology used. I apologize for any typographical errors that were not detected and corrected.) Date: 03/02/2023; Time: 7:06 PM

## 2023-03-02 ENCOUNTER — Ambulatory Visit (HOSPITAL_BASED_OUTPATIENT_CLINIC_OR_DEPARTMENT_OTHER): Payer: Medicare Other | Admitting: Pain Medicine

## 2023-03-02 DIAGNOSIS — G8929 Other chronic pain: Secondary | ICD-10-CM

## 2023-03-02 DIAGNOSIS — G35 Multiple sclerosis: Secondary | ICD-10-CM

## 2023-03-02 DIAGNOSIS — G959 Disease of spinal cord, unspecified: Secondary | ICD-10-CM

## 2023-03-02 DIAGNOSIS — Z91199 Patient's noncompliance with other medical treatment and regimen due to unspecified reason: Secondary | ICD-10-CM

## 2023-03-02 DIAGNOSIS — G379 Demyelinating disease of central nervous system, unspecified: Secondary | ICD-10-CM

## 2023-03-03 ENCOUNTER — Ambulatory Visit: Payer: Medicare Other | Admitting: Occupational Therapy

## 2023-03-03 ENCOUNTER — Ambulatory Visit: Payer: Medicare Other | Admitting: Physical Therapy

## 2023-03-08 ENCOUNTER — Telehealth: Payer: Self-pay

## 2023-03-08 ENCOUNTER — Other Ambulatory Visit: Payer: Self-pay | Admitting: Nurse Practitioner

## 2023-03-08 ENCOUNTER — Ambulatory Visit: Payer: Medicare Other | Admitting: Physical Therapy

## 2023-03-08 ENCOUNTER — Ambulatory Visit: Payer: Medicare Other | Admitting: Occupational Therapy

## 2023-03-08 DIAGNOSIS — R29818 Other symptoms and signs involving the nervous system: Secondary | ICD-10-CM

## 2023-03-08 DIAGNOSIS — M6281 Muscle weakness (generalized): Secondary | ICD-10-CM | POA: Diagnosis not present

## 2023-03-08 DIAGNOSIS — G35 Multiple sclerosis: Secondary | ICD-10-CM

## 2023-03-08 DIAGNOSIS — R269 Unspecified abnormalities of gait and mobility: Secondary | ICD-10-CM

## 2023-03-08 DIAGNOSIS — R2689 Other abnormalities of gait and mobility: Secondary | ICD-10-CM | POA: Diagnosis not present

## 2023-03-08 DIAGNOSIS — R2681 Unsteadiness on feet: Secondary | ICD-10-CM | POA: Diagnosis not present

## 2023-03-08 DIAGNOSIS — R278 Other lack of coordination: Secondary | ICD-10-CM

## 2023-03-08 DIAGNOSIS — R293 Abnormal posture: Secondary | ICD-10-CM

## 2023-03-08 NOTE — Therapy (Unsigned)
OUTPATIENT OCCUPATIONAL THERAPY NEURO TREATMENT  Patient Name: Selena Bender MRN: [id number] DOB:13-May-1951, 72 y.o., female Today's Date: 03/08/2023  PCP: Bethanie Dicker, NP REFERRING PROVIDER: Bethanie Dicker, NP   END OF SESSION:  OT End of Session - 03/08/23 1147     Visit Number 3    Number of Visits 12   + evaluation   Date for OT Re-Evaluation 04/15/23    Authorization Type Medicare and [employer] VL 60    Progress Note Due on Visit 10    OT Start Time 1146    OT Stop Time 1229    OT Time Calculation (min) 43 min    Equipment Utilized During Treatment FM materials, Oval 8 finger splint    Activity Tolerance Patient tolerated treatment well    Behavior During Therapy WFL for tasks assessed/performed             Past Medical History:  Diagnosis Date   Asthma    Breast cancer (HCC) 2014   Right   Chicken pox    CKD (chronic kidney disease), stage III (HCC)    COVID-19 virus infection 12/2020   Hypertension    Multiple sclerosis (HCC)    Seizures (HCC)    Past Surgical History:  Procedure Laterality Date   BREAST SURGERY     MASTECTOMY Right 2014   Patient Active Problem List   Diagnosis Date Noted   Spinal cord disease (HCC) 02/01/2023   Abnormal MRI, cervical spine (06/06/2020 & 11/13/2022) 02/01/2023   Demyelinating disease of central nervous system (HCC) 02/01/2023   Chronic radicular pain of lower extremity (S1) (Bilateral) 02/01/2023   Lower extremity burning pain 02/01/2023   Chronic low back pain (2ry area of Pain) (Bilateral) (R>L) w/ sciatica (Bilateral) 02/01/2023   Chronic hip pain (Bilateral) (L>R) 02/01/2023   Chronic upper extremity pain (intermittent) (Bilateral) 02/01/2023   Allergic rhinitis 01/14/2023   CKD (chronic kidney disease), stage III (HCC)    COVID-19 virus infection 12/2020   Myelopathy (HCC) 09/25/2020   History of right breast cancer 03/13/2020   Status post right mastectomy 03/13/2020   Abnormal gait 09/15/2018    Claustrophobia 09/15/2018   Hypertension 09/15/2018   Focal epilepsy (HCC) 09/15/2018   Multiple sclerosis (HCC) 09/15/2018   Paresthesia 09/15/2018   Chronic lower extremity pain (1ry area of Pain) (Bilateral) (R>L) 12/31/2015   Seizure (HCC) 12/31/2015   Neuropathic pain 06/02/2015   No advance directives 11/02/2012   Refusal of blood transfusion for reasons of conscience 11/02/2012   Malignant neoplasm of upper-outer quadrant of right breast in female, estrogen receptor positive (HCC) 11/01/2012    ONSET DATE: 02/04/2023  REFERRING DIAG: W09.811 (ICD-10-CM) - Left hand weakness G35 (ICD-10-CM) - Multiple sclerosis (HCC)  THERAPY DIAG:  Muscle weakness (generalized)  Other lack of coordination  Other symptoms and signs involving the nervous system  Rationale for Evaluation and Treatment: Rehabilitation  SUBJECTIVE:   SUBJECTIVE STATEMENT: Her granddaughter asked her what was wrong with her left eye.   Pt accompanied by: self   PERTINENT HISTORY:   Pt is a 72 yr old AA female with hx of relapsing remitting MS and breast CA with mastectomy as well as seizures, chronic pain in B legs/arms and a lot of nerve pain.  PRECAUTIONS: None  WEIGHT BEARING RESTRICTIONS: No  PAIN:  Are you having pain? Yes: NPRS scale:  6/10 Pain location: legs all the way to feet and arms all the way to her hands Pain description: nerve pains - pins  and needles Aggravating factors: hard to say - sometimes movement is fine but sometimes it makes her feel better Relieving factors: unknown  FALLS: Has patient fallen in last 6 months? No  LIVING ENVIRONMENT: Lives with: lives with their daughter Lives in: Apartment Stairs: No Has following equipment at home: Counselling psychologist, Environmental consultant - 4 wheeled, shower chair, Grab bars, and has grab bar that needs to be installed as the suction grab bar came off the wall during a shower a few months ago - also may need elevated BSC  PLOF: Independent  with basic ADLs, Independent with household mobility with device, and Needs assistance with homemaking  PATIENT GOALS: Use my hands better.  OBJECTIVE:   HAND DOMINANCE: Right  ADLs: Overall ADLs: Pt generally MI Transfers/ambulation related to ADLs: MI with AE Eating: I Grooming: Gets hair/nails done professionally UB Dressing: Sits down to get dressed LB Dressing: Able to do it with a little bit of a struggle but "I can still do it." Toileting: MI Bathing: Showers 2x/week Tub Shower transfers: Supervision - needs grab bar replaced Equipment: Shower seat with back, Walk in shower, and Reacher  IADLs: Shopping: I go shopping sometimes (does not drive). Light housekeeping: I do my own laundry Meal Prep: Daughter does most of it. Every now and then I get in the kitchen. Community mobility: Uses cane in public and walker at home (needs a new rollator due to poor brakes etc) - may need rx for new walker Medication management: Self managed with pill bottles - only uses a pill box only when she goes away/vacation Financial management: Self managed Handwriting: 50% legible   MOBILITY STATUS: Needs Assist: uses AE - rollator at home or cane in public  POSTURE COMMENTS:  No Significant postural limitations Sitting balance: WFL  ACTIVITY TOLERANCE: Activity tolerance: Patient reports only doing 1 thing/day ie) shopping, appt or getting nails done.  Needs to take breaks ie) when doing her hair.  FUNCTIONAL OUTCOME MEASURES: Quick Dash: 34.1 % disability   UPPER EXTREMITY ROM:    Active ROM Right eval Left eval  Shoulder flexion [facility]   Shoulder abduction    Shoulder adduction    Shoulder extension    Shoulder internal rotation    Shoulder external rotation    Elbow flexion    Elbow extension  Decreased end range  Wrist flexion    Wrist extension    Wrist ulnar deviation    Wrist radial deviation    Wrist pronation    Wrist supination    (Blank rows = not  tested)  UPPER EXTREMITY MMT:     MMT Right eval Left eval  Shoulder flexion 4/5 3/5  Shoulder abduction    Shoulder adduction    Shoulder extension    Shoulder internal rotation    Shoulder external rotation    Middle trapezius    Lower trapezius    Elbow flexion    Elbow extension    Wrist flexion    Wrist extension    Wrist ulnar deviation    Wrist radial deviation    Wrist pronation    Wrist supination    (Blank rows = not tested)  HAND FUNCTION: Grip strength: Right: 33.2, 44.9, 35.0  lbs; Left: 19.8, 23.5, 21.8 lbs Evaluation Average: Right 37.7 lbs Left 21.7 lbs  COORDINATION: 9 Hole Peg test: Right: 47.93 sec; Left: 2:00:03 sec Box and Blocks:  Right 22 blocks, Left 18 blocks  SENSATION: Light touch: Impaired  Patient reports R  side intact L palmar surface a bit dull and tingly in comparison.  EDEMA: NS  MUSCLE TONE:   COGNITION: Overall cognitive status:  Patient does report some changes in memory  VISION: Subjective report: L eye wanders and doesn't focus (does not like to look in the mirror due to changes which are also noted and pointed out by other family members) Baseline vision: Bifocals Visual history: NA  VISION ASSESSMENT: Impaired Tracking/Visual pursuits: Decreased smoothness with horizontal tracking  Patient has difficulty with following activities due to following visual impairments: eye wanders  PERCEPTION: Not tested  PRAXIS: Not tested  Evaluation OBSERVATIONS: Patient is a well groomed lady who regularly has her nails manicured.  She has L pinkie finger often held in a tight hook position often with difficulty extending it actively.  Awkward positioning noted with pegboard test with L arm ie) alternating between turning wrist pronated/supinated at times.    TODAY'S TREATMENT:                                                                                                                              DATE:   Therapist reviewed  goals with patient and updated patient progression.  No additional functional limitations identified.  Objective measures assessed as noted in Goals section to determine progression towards goals. OT initiated diplopia HEP as noted in pt instructions.   PATIENT EDUCATION: Education details: Diplopia HEP Person educated: Patient Education method: Explanation, Demonstration, Tactile cues, Verbal cues, and Handouts Education comprehension: verbalized understanding, returned demonstration, verbal cues required, tactile cues required, and needs further education  HOME EXERCISE PROGRAM: 03/01/23: Coordination activities with images provided 03/08/2023 -    GOALS: Goals reviewed with patient? Yes   GOALS:  SHORT TERM GOALS: Target date: 03/17/2023    Patient will demonstrate BUE HEP (ROM, strength & coordination) with 25% verbal cues or less for proper execution. Baseline: not yet initiated Goal status: IN PROGRESS  2.  Pt will verbalize understanding of adapted strategies/equipment to maximize safety and independence with ADLs/IADLs. Baseline: not yet initiated Goal status: INITIAL  3.  Patient will demonstrate at least 40 lbs R grip and 25 lbs L grip strength as needed to open jars and other containers.  Baseline: Evaluation Average: Right 37.7 lbs Left 21.7 lbs  Goal statues: IN PROGRESS  4. Patient will be aware of sensory stimulation and compensatory strategies for diminished sensation.  Baseline: Patient reports R side intact L palmar surface a bit dull and tingly in comparison.  Goal Status: INITIAL  5.  Pt will be able to place at least 5 more blocks with each hand with completion of Box and Blocks test. Baseline: Box and Blocks:  Right 22 blocks, Left 18 blocks Goal status: IN PROGRESS  6.  Pt will demonstrate use of binder for information organization of HEPs, MS resources and medical records. Baseline: not yet initiated - daughter present at eval for recommendation Goal  status: INITIAL  LONG  TERM GOALS: Target date: 04/08/2023    Pt will verbalize understanding (or locate information in info binder) re: ways to prevent future MS related complications and MS community resources. Baseline: not yet initiated Goal status: INITIAL  2.  Pt will verbalize understanding of ways to keep thinking skills sharp and ways to compensate for STM changes in the future. Baseline: not yet initiated Goal status: INITIAL  3.  Pt will demonstrate improved fine motor coordination for ADLs as evidenced by decreasing 9 hole peg test score for BUE by 10-15 secs. Baseline: 9 Hole Peg test: Right: 47.93 sec; Left: 2:00:03 sec Goal status: IN PROGRESS  4.  Pt will demonstrate improved positioning of L pinkie finger through proper day and/or nighttime splinting as deemed effective, comfortable and appropriate. Baseline: poor active extension of PIP joint Goal status: IN PROGRESS  5.  Patient will demonstrate at least 16% improvement with quick Dash score (reporting 18% disability or less) indicating improved functional use of affected extremity. Baseline: 34% disability Goal status: INITIAL      ASSESSMENT:  CLINICAL IMPRESSION:  Impaired perception evident by L lateral lean, difficulty coming to center with cards, and arced movement of card while horizontally moving to L. Will continue to assess and address as needed.  PERFORMANCE DEFICITS: in functional skills including ADLs, coordination, dexterity, sensation, edema, ROM, strength, pain, flexibility, Fine motor control, Gross motor control, mobility, balance, endurance, decreased knowledge of use of DME, and UE functional use, cognitive skills including attention and memory, and psychosocial skills including coping strategies and routines and behaviors.   IMPAIRMENTS: are limiting patient from ADLs, rest and sleep, leisure, and social participation.   CO-MORBIDITIES: may have co-morbidities  that affects occupational  performance. Patient will benefit from skilled OT to address above impairments and improve overall function.  REHAB POTENTIAL: Good  PLAN:  OT FREQUENCY: 1-2x/week  OT DURATION: 6 weeks  PLANNED INTERVENTIONS: self care/ADL training, therapeutic exercise, therapeutic activity, neuromuscular re-education, balance training, functional mobility training, splinting, patient/family education, cognitive remediation/compensation, visual/perceptual remediation/compensation, energy conservation, coping strategies training, and DME and/or AE instructions  RECOMMENDED OTHER SERVICES: May need prescriptions for [facility] and new rollator.  Also may need eye exam.  CONSULTED AND AGREED WITH PLAN OF CARE: Patient and family member/caregiver  PLAN FOR NEXT SESSIONS: Review diplopia HEP.  Sent request for BSC/rollator replacement. Any updates? MS Education ie) resources, AE, energy conservation Will need HEP ideas for UE ROM, coordination, strength and sensory awareness. Splint considerations for L pinkie finger.   Delana Meyer, OT 03/08/2023, 5:59 PM

## 2023-03-08 NOTE — Patient Instructions (Signed)
Diplopia HEP:  Perform at least 3 times per day. Stop if your eye becomes fatigued or hurts and try again later.  1. Hold a small object/card in front of you.  Hold it in the middle at arm's length away.    2. Cover your LEFT eye and look at the object with your RIGHT eye.  3. Slowly move the object side to side in front of you while continuing to watch it with your RIGHT eye.  4.  Remember to keep your head still and only move your eye.  5.  Repeat 5-10 times.  6.  Then, move object up and down while watching it 5-10 times.  7. Cover your RIGHT eye and look at the object with your LEFT eye while you repeat #1-6 above.  8.  Now, uncover both eyes and try to focus on the object while holding it in the middle.  Try to make it 1 image.   9.  If you can, try to hold it for 10-30 sec increasing as able.    10.  Once you can make the image 1 for at least 30 sec in the middle, repeat #1-6 above with both eyes moving slowly and only in the range that you can keep the image 1.  

## 2023-03-08 NOTE — Therapy (Signed)
OUTPATIENT PHYSICAL THERAPY NEURO TREATMENT   Patient Name: Selena Bender MRN: [id number] DOB:[birth year], 72 y.o., female Today's Date: 03/08/2023   PCP: Bethanie Dicker, NP REFERRING PROVIDER: Bethanie Dicker, NP  END OF SESSION:  PT End of Session - 03/08/23 1115     Visit Number 7    Number of Visits 17   with eval   Date for PT Re-Evaluation 04/28/23    Authorization Type Medicare    Progress Note Due on Visit 10    PT Start Time 1112   pt arrived late   PT Stop Time 1145    PT Time Calculation (min) 33 min    Equipment Utilized During Treatment Gait belt    Activity Tolerance Patient tolerated treatment well    Behavior During Therapy WFL for tasks assessed/performed                  Past Medical History:  Diagnosis Date   Asthma    Breast cancer (HCC) 2014   Right   Chicken pox    CKD (chronic kidney disease), stage III (HCC)    COVID-19 virus infection 12/2020   Hypertension    Multiple sclerosis (HCC)    Seizures (HCC)    Past Surgical History:  Procedure Laterality Date   BREAST SURGERY     MASTECTOMY Right 2014   Patient Active Problem List   Diagnosis Date Noted   Spinal cord disease (HCC) 02/01/2023   Abnormal MRI, cervical spine (06/06/2020 & 11/13/2022) 02/01/2023   Demyelinating disease of central nervous system (HCC) 02/01/2023   Chronic radicular pain of lower extremity (S1) (Bilateral) 02/01/2023   Lower extremity burning pain 02/01/2023   Chronic low back pain (2ry area of Pain) (Bilateral) (R>L) w/ sciatica (Bilateral) 02/01/2023   Chronic hip pain (Bilateral) (L>R) 02/01/2023   Chronic upper extremity pain (intermittent) (Bilateral) 02/01/2023   Allergic rhinitis 01/14/2023   CKD (chronic kidney disease), stage III (HCC)    COVID-19 virus infection 12/2020   Myelopathy (HCC) 09/25/2020   History of right breast cancer 03/13/2020   Status post right mastectomy 03/13/2020   Abnormal gait 09/15/2018   Claustrophobia 09/15/2018    Hypertension 09/15/2018   Focal epilepsy (HCC) 09/15/2018   Multiple sclerosis (HCC) 09/15/2018   Paresthesia 09/15/2018   Chronic lower extremity pain (1ry area of Pain) (Bilateral) (R>L) 12/31/2015   Seizure (HCC) 12/31/2015   Neuropathic pain 06/02/2015   No advance directives 11/02/2012   Refusal of blood transfusion for reasons of conscience 11/02/2012   Malignant neoplasm of upper-outer quadrant of right breast in female, estrogen receptor positive (HCC) 11/01/2012    ONSET DATE: 01/14/2023 (referral date)  REFERRING DIAG: G35 (ICD-10-CM) - Multiple sclerosis (HCC) R26.9 (ICD-10-CM) - Abnormal gait M62.81 (ICD-10-CM) - Generalized muscle weakness  THERAPY DIAG:  Muscle weakness (generalized)  Unsteadiness on feet  Other abnormalities of gait and mobility  Abnormal posture  Rationale for Evaluation and Treatment: Rehabilitation  SUBJECTIVE:  SUBJECTIVE STATEMENT: Pt reports she is having more pain today in her lower body, 8-9/10. Denies any falls or other acute changes. Pt reports she has been doing her HEP and it is going well, sit to stands are getting easier. Pt reports relief of pain in her BLE by end of session.  Pt accompanied by: self and family member daughter [name] (outside)  PERTINENT HISTORY: relapsing remitting MS and breast CA and seizures  PAIN:  Are you having pain? Yes: NPRS scale: 4-5/10 Pain location: legs, hips down into thighs Pain description: pins and needles, burning sensation Aggravating factors: hard to tell just gets bad sometimes Relieving factors: pain medication, sometimes movement  Took pain medicine before she came today.  PRECAUTIONS: Fall  WEIGHT BEARING RESTRICTIONS: No  FALLS: Has patient fallen in last 6 months? No, several near falls  though  LIVING ENVIRONMENT: Lives with: lives with their daughter [name] Lives in: House/apartment Stairs: No Has following equipment at home: Counselling psychologist, Environmental consultant - 4 wheeled, and shower chair *needs new rollator and needs a BSC  PLOF: Independent with basic ADLs, Independent with gait, Independent with transfers, Requires assistive device for independence, and Needs assistance with homemaking  PATIENT GOALS: "to help me move around a little bit better and improve my balance, less pain"  OBJECTIVE:   DIAGNOSTIC FINDINGS:  B hip xrays 02/01/23: FINDINGS: SI joints are patent. Pubic symphysis and rami appear intact. Mild bilateral hip degenerative changes with minimal spurring and joint space narrowing   IMPRESSION: Mild bilateral hip degenerative changes.  Lumbar spine xray 02/01/23: FINDINGS: Five non rib-bearing lumbar type vertebra. Lumbar alignment within normal limits. Vertebral body heights are maintained. No suspicious change in alignment with flexion or extension. Mild disc space narrowing L2-L3 and L4-L5. Moderate facet degenerative changes worst at L4-L5 and L5-S1.   IMPRESSION: Multilevel degenerative changes as described above.  COGNITION: Overall cognitive status: Impaired "I'm not as sharp as I used to be"   SENSATION: Decreased light touch in R lateral knee  EDEMA:  Swelling in L knee most of the time, occasionally in L ankle  POSTURE: rounded shoulders, forward head, and posterior pelvic tilt  LOWER EXTREMITY ROM:     Passive  Right Eval Left Eval  Hip flexion   [facility]  Hip extension    Hip abduction    Hip adduction    Hip internal rotation    Hip external rotation    Knee flexion    Knee extension    Ankle dorsiflexion    Ankle plantarflexion    Ankle inversion    Ankle eversion     (Blank rows = not tested)  LOWER EXTREMITY MMT:    MMT Right Eval Left Eval  Hip flexion 5 2-  Hip extension    Hip abduction    Hip  adduction    Hip internal rotation    Hip external rotation    Knee flexion 5 2-  Knee extension 5 3  Ankle dorsiflexion 4 3  Ankle plantarflexion    Ankle inversion    Ankle eversion    (Blank rows = not tested)   TODAY'S TREATMENT:  TherAct For STG assessment:  [facility] PT Assessment - 03/08/23 1120       Ambulation/Gait   Gait velocity 32.8 ft over 29 sec = 1.13 ft/sec      Standardized Balance Assessment   Standardized Balance Assessment Timed Up and Go Test;Five Times Sit to Stand    Five times sit to stand comments  19.34 sec   uses BUE support on arms of chair     Timed Up and Go Test   TUG Normal TUG    Normal TUG (seconds) 35   with SBQC           To work on gait with LRAD and obstacle navigation with CGA and max v/c for correct performance: Cone weave with rollator with cones set 3.5 ft apart 6 x 10 ft Cone weave with SBQC with cones set 3 ft apart 6 x 10 ft Max cues to perform task correctly as pt tends to stay close to the cone and just turn around one cone vs weaving   PATIENT EDUCATION: Education details: Continue practicing sit to stands at home, continue working on [employer], results of OM and functional implications Person educated: Patient Education method: Programmer, multimedia, Demonstration, and Verbal cues Education comprehension: verbalized understanding, returned demonstration, verbal cues required, and needs further education  HOME EXERCISE PROGRAM: From prior POC: Access Code: [email or web address] URL: [email or web address] Date: 02/15/2023 Prepared by: Peter Congo  Exercises - Sit to Stand Without Arm Support  - 1 x daily - 7 x weekly - 1 sets - 10 reps - Standing March with Counter Support  - 1 x daily - 7 x weekly - 1 sets - 10 reps - Standing Hip Abduction with Counter Support  - 1 x daily - 7 x weekly - 1 sets - 10  reps - Standing Hip Extension with Counter Support  - 1 x daily - 7 x weekly - 1 sets - 10 reps - Side Stepping with Counter Support  - 1 x daily - 7 x weekly - 1 sets - 10 reps - Seated Ankle Pumps  - 1 x daily - 7 x weekly - 3 sets - 10 reps - Standing Ankle Dorsiflexion Stretch  - 1 x daily - 7 x weekly - 3 sets - 10 reps - 30 second hold  - Supine March  - 1 x daily - 7 x weekly - 3 sets - 5-10 reps - Supine Bridge  - 1 x daily - 7 x weekly - 1 sets - 10 reps  Black = reviewed  Red = previous POC, not reviewed yet  GOALS: Goals reviewed with patient? Yes  SHORT TERM GOALS: Target date: 03/03/2023  Pt will be independent with initial HEP for improved strength, balance, transfers and gait. Baseline: Goal status:  MET  2.  Pt will improve 5 x STS to less than or equal to 18 seconds to demonstrate improved functional strength and transfer efficiency with BUE support as needed. Baseline: 21.15 sec with heavy UE reliance on arms of chair (6/27), 19.34 sec with BUE support on arms of chair (7/30) Goal status: IN PROGRESS  3.  Pt will improve normal TUG to less than or equal to 35 seconds for improved functional mobility and decreased fall risk with LRAD. Baseline: 40.37 sec with SBQC (6/27), 35 sec with SBQC (7/30) Goal status: MET  4.  Pt will improve gait velocity to at least 1.0 ft/sec for improved gait efficiency and performance at mod I level with LRAD. Baseline: 0.83  ft/sec with SBQC (6/27), 1.13 ft/sec with SBQC (7/30) Goal status: MET   LONG TERM GOALS: Target date: 04/07/2023   Pt will be independent with final HEP for improved strength, balance, transfers and gait. Baseline:  Goal status: INITIAL  2.  Pt will improve 5 x STS to less than or equal to 18 seconds to demonstrate improved functional strength and transfer efficiency with decreased BUE support.  Baseline: 21.15 sec with heavy UE reliance on arms of chair (6/27), 19.34 sec with BUE support on arms of chair  (7/30) Goal status: IREVISED  3.  Pt will improve normal TUG to less than or equal to 30 seconds for improved functional mobility and decreased fall risk with LRAD. Baseline: 40.37 sec with SBQC (6/27), 35 sec with SBQC (7/30) Goal status: INITIAL  4.  Pt will improve gait velocity to at least 1.25 ft/sec for improved gait efficiency and performance at mod I level with LRAD Baseline: 0.83 ft/sec with SBQC (6/27), 1.13 ft/sec with SBQC (7/30) Goal status: INITIAL  5.  Pt will improve her score on the MSIS-29 to </= 95 points to demonstrate decreased disability level. Baseline: 105/145 (6/27) Goal status: INITIAL    ASSESSMENT:  CLINICAL IMPRESSION: Session limited by patient's late arrival as well as pain. Emphasis of skilled PT session on reassessing STG and working on obstacle navigation with various ADs. Pt has met 3/4 STG due to being independent with her initial HEP, improving her gait speed to 1.13 ft/sec and improving her TUG score to 35 sec. She did improve her 5xSTS score though not enough to meet her goal. Pt demonstrate improved balance and decreased fall risk based on scores on functional outcome measures this session. Pt does remain limited by ongoing impaired balance, decreased LE strength, and decreased safety awareness. Pt continues to benefit from skilled therapy services to work towards LTGs and in order to continue to decrease her fall risk. Continue POC.     OBJECTIVE IMPAIRMENTS: Abnormal gait, cardiopulmonary status limiting activity, decreased activity tolerance, decreased balance, decreased endurance, decreased mobility, difficulty walking, decreased strength, increased edema, impaired sensation, impaired UE functional use, and pain.   ACTIVITY LIMITATIONS: carrying, lifting, bending, standing, squatting, stairs, transfers, and bed mobility  PARTICIPATION LIMITATIONS: meal prep, cleaning, laundry, driving, and community activity  PERSONAL FACTORS: Age, Sex, Time  since onset of injury/illness/exacerbation, Transportation, and 1-2 comorbidities: RRMS, cancer, seizures  are also affecting patient's functional outcome.   REHAB POTENTIAL: Fair time since onset of MS, previous bouts of therapy  CLINICAL DECISION MAKING: Stable/uncomplicated  EVALUATION COMPLEXITY: Moderate  PLAN:  PT FREQUENCY: 2x/week  PT DURATION: 8 weeks  PLANNED INTERVENTIONS: Therapeutic exercises, Therapeutic activity, Neuromuscular re-education, Balance training, Gait training, Patient/Family education, Self Care, Joint mobilization, Stair training, Vestibular training, Canalith repositioning, Visual/preceptual remediation/compensation, Orthotic/Fit training, DME instructions, Aquatic Therapy, Dry Needling, Electrical stimulation, Cryotherapy, Moist heat, Taping, Manual therapy, and Re-evaluation  PLAN FOR NEXT SESSION: Update HEP for more challenging LLE strengthening and NMR, balance, endurance, gait training with rollator, (Pt's old genu recurvatum brace is in [employer])  Could also try estim or bioness in future sessions (let her know when so she can wear shorts)      Peter Congo, PT, DPT, CSRS  03/08/2023, 11:46 AM

## 2023-03-08 NOTE — Telephone Encounter (Signed)
Called and spoke with pt in regards to her bedside commode and walker order.    Pt has been informed that Bethanie Dicker has ordered/ signed the orders and pt can come by the office to pick up the orders and take them to a medical supply store and they will be able to supply the walker and commode.     Both orders have been placed in a yellow envelope labeled with pts name for pick up.

## 2023-03-10 ENCOUNTER — Ambulatory Visit: Payer: Medicare Other | Attending: Nurse Practitioner | Admitting: Physical Therapy

## 2023-03-10 ENCOUNTER — Ambulatory Visit: Payer: Medicare Other | Admitting: Occupational Therapy

## 2023-03-10 ENCOUNTER — Encounter: Payer: Self-pay | Admitting: Physical Therapy

## 2023-03-10 DIAGNOSIS — G8929 Other chronic pain: Secondary | ICD-10-CM | POA: Insufficient documentation

## 2023-03-10 DIAGNOSIS — R278 Other lack of coordination: Secondary | ICD-10-CM | POA: Insufficient documentation

## 2023-03-10 DIAGNOSIS — M25562 Pain in left knee: Secondary | ICD-10-CM | POA: Diagnosis not present

## 2023-03-10 DIAGNOSIS — R41842 Visuospatial deficit: Secondary | ICD-10-CM | POA: Insufficient documentation

## 2023-03-10 DIAGNOSIS — M6281 Muscle weakness (generalized): Secondary | ICD-10-CM | POA: Diagnosis not present

## 2023-03-10 DIAGNOSIS — R2681 Unsteadiness on feet: Secondary | ICD-10-CM | POA: Insufficient documentation

## 2023-03-10 DIAGNOSIS — R41844 Frontal lobe and executive function deficit: Secondary | ICD-10-CM | POA: Diagnosis not present

## 2023-03-10 DIAGNOSIS — R208 Other disturbances of skin sensation: Secondary | ICD-10-CM | POA: Diagnosis not present

## 2023-03-10 DIAGNOSIS — R2689 Other abnormalities of gait and mobility: Secondary | ICD-10-CM | POA: Diagnosis not present

## 2023-03-10 NOTE — Patient Instructions (Signed)
Putty Exercises  Access Code: [email or web address] URL: [email or web address] Date: 03/10/2023 Prepared by: Amada Kingfisher  Exercises - Putty Squeezes  - 1 x daily - 10 reps - Rolling Putty on Table  - 1 x daily - 10 reps - Tip PUSH with Putty  - 1 x daily - 10 reps - 3-Point Pinch with Putty  - 1 x daily - 10 reps - Key Pinch with Putty  - 1 x daily - 10 reps - Removing Marbles from Putty  - 1 x daily - 10 reps

## 2023-03-10 NOTE — Therapy (Signed)
[facility] [address] [place], [place], [address] Phone: [phone]   Fax:  [phone]  Patient Details  Name: Selena Bender MRN: [id number] Date of Birth: [birth year] Referring Provider:  No ref. provider found  Encounter Date: 03/10/2023  Called pt and left VM regarding no-show to today's PT appointment. Reminded pt she also has OT today at 12:30 and to call clinic if she cannot make her appointment.   Jill Alexanders Colston Pyle, PT 03/10/2023, 11:21 AM  [facility] [address] [place], [place], [address] Phone: [phone]   Fax:  [phone]

## 2023-03-10 NOTE — Therapy (Signed)
OUTPATIENT OCCUPATIONAL THERAPY NEURO TREATMENT  Patient Name: Selena Bender MRN: [id number] DOB:November 06, 1950, 72 y.o., female Today's Date: 03/10/2023  PCP: Bethanie Dicker, NP REFERRING PROVIDER: Bethanie Dicker, NP   END OF SESSION:  OT End of Session - 03/10/23 1227     Visit Number 4    Number of Visits 12   + evaluation   Date for OT Re-Evaluation 04/15/23    Authorization Type Medicare and [employer] VL 60    Progress Note Due on Visit 10    OT Start Time 1230    OT Stop Time 1315    OT Time Calculation (min) 45 min    Equipment Utilized During Treatment Putty    Activity Tolerance Patient tolerated treatment well    Behavior During Therapy [facility] for tasks assessed/performed             Past Medical History:  Diagnosis Date   Asthma    Breast cancer (HCC) 2014   Right   Chicken pox    CKD (chronic kidney disease), stage III (HCC)    COVID-19 virus infection 12/2020   Hypertension    Multiple sclerosis (HCC)    Seizures (HCC)    Past Surgical History:  Procedure Laterality Date   BREAST SURGERY     MASTECTOMY Right 2014   Patient Active Problem List   Diagnosis Date Noted   Spinal cord disease (HCC) 02/01/2023   Abnormal MRI, cervical spine (06/06/2020 & 11/13/2022) 02/01/2023   Demyelinating disease of central nervous system (HCC) 02/01/2023   Chronic radicular pain of lower extremity (S1) (Bilateral) 02/01/2023   Lower extremity burning pain 02/01/2023   Chronic low back pain (2ry area of Pain) (Bilateral) (R>L) w/ sciatica (Bilateral) 02/01/2023   Chronic hip pain (Bilateral) (L>R) 02/01/2023   Chronic upper extremity pain (intermittent) (Bilateral) 02/01/2023   Allergic rhinitis 01/14/2023   CKD (chronic kidney disease), stage III (HCC)    COVID-19 virus infection 12/2020   Myelopathy (HCC) 09/25/2020   History of right breast cancer 03/13/2020   Status post right mastectomy 03/13/2020   Abnormal gait 09/15/2018   Claustrophobia 09/15/2018    Hypertension 09/15/2018   Focal epilepsy (HCC) 09/15/2018   Multiple sclerosis (HCC) 09/15/2018   Paresthesia 09/15/2018   Chronic lower extremity pain (1ry area of Pain) (Bilateral) (R>L) 12/31/2015   Seizure (HCC) 12/31/2015   Neuropathic pain 06/02/2015   No advance directives 11/02/2012   Refusal of blood transfusion for reasons of conscience 11/02/2012   Malignant neoplasm of upper-outer quadrant of right breast in female, estrogen receptor positive (HCC) 11/01/2012    ONSET DATE: 02/04/2023  REFERRING DIAG: M01.027 (ICD-10-CM) - Left hand weakness G35 (ICD-10-CM) - Multiple sclerosis (HCC)  THERAPY DIAG:  Muscle weakness (generalized)  Other lack of coordination  Visuospatial deficit  Rationale for Evaluation and Treatment: Rehabilitation  SUBJECTIVE:   SUBJECTIVE STATEMENT: Patient reported that she missed her PT visit earlier due to her daughter misplacing her keys.  She reports that she has been working on her eye exercises at home.   Pt accompanied by: self   PERTINENT HISTORY:   Pt is a 72 yr old AA female with hx of relapsing remitting MS and breast CA with mastectomy as well as seizures, chronic pain in B legs/arms and a lot of nerve pain.  PRECAUTIONS: None  WEIGHT BEARING RESTRICTIONS: No  PAIN:  Are you having pain? Yes: NPRS scale: 6/10 Pain location: legs all the way to feet and arms all the way to her  hands; legs >arms Pain description: nerve pains - pins and needles Aggravating factors: hard to say - sometimes movement is fine but sometimes it makes her feel better Relieving factors: unknown  FALLS: Has patient fallen in last 6 months? No  LIVING ENVIRONMENT: Lives with: lives with their daughter Lives in: Apartment Stairs: No Has following equipment at home: Counselling psychologist, Environmental consultant - 4 wheeled, shower chair, Grab bars, and has grab bar that needs to be installed as the suction grab bar came off the wall during a shower a few months ago -  also may need elevated BSC  PLOF: Independent with basic ADLs, Independent with household mobility with device, and Needs assistance with homemaking  PATIENT GOALS: Use my hands better.  OBJECTIVE:   HAND DOMINANCE: Right  ADLs: Overall ADLs: Pt generally MI Transfers/ambulation related to ADLs: MI with AE Eating: I Grooming: Gets hair/nails done professionally UB Dressing: Sits down to get dressed LB Dressing: Able to do it with a little bit of a struggle but "I can still do it." Toileting: MI Bathing: Showers 2x/week Tub Shower transfers: Supervision - needs grab bar replaced Equipment: Shower seat with back, Walk in shower, and Reacher  IADLs: Shopping: I go shopping sometimes (does not drive). Light housekeeping: I do my own laundry Meal Prep: Daughter does most of it. Every now and then I get in the kitchen. Community mobility: Uses cane in public and walker at home (needs a new rollator due to poor brakes etc) - may need rx for new walker Medication management: Self managed with pill bottles - only uses a pill box only when she goes away/vacation Financial management: Self managed Handwriting: 50% legible   MOBILITY STATUS: Needs Assist: uses AE - rollator at home or cane in public  POSTURE COMMENTS:  No Significant postural limitations Sitting balance: WFL  ACTIVITY TOLERANCE: Activity tolerance: Patient reports only doing 1 thing/day ie) shopping, appt or getting nails done.  Needs to take breaks ie) when doing her hair.  FUNCTIONAL OUTCOME MEASURES: Quick Dash: 34.1 % disability   UPPER EXTREMITY ROM:    Active ROM Right eval Left eval  Shoulder flexion [facility]   Shoulder abduction    Shoulder adduction    Shoulder extension    Shoulder internal rotation    Shoulder external rotation    Elbow flexion    Elbow extension  Decreased end range  Wrist flexion    Wrist extension    Wrist ulnar deviation    Wrist radial deviation    Wrist pronation     Wrist supination    (Blank rows = not tested)  UPPER EXTREMITY MMT:     MMT Right eval Left eval  Shoulder flexion 4/5 3/5  Shoulder abduction    Shoulder adduction    Shoulder extension    Shoulder internal rotation    Shoulder external rotation    Middle trapezius    Lower trapezius    Elbow flexion    Elbow extension    Wrist flexion    Wrist extension    Wrist ulnar deviation    Wrist radial deviation    Wrist pronation    Wrist supination    (Blank rows = not tested)  HAND FUNCTION: Grip strength: Right: 33.2, 44.9, 35.0  lbs; Left: 19.8, 23.5, 21.8 lbs Evaluation Average: Right 37.7 lbs Left 21.7 lbs  COORDINATION: 9 Hole Peg test: Right: 47.93 sec; Left: 2:00:03 sec Box and Blocks:  Right 22 blocks, Left 18 blocks  SENSATION: Light touch: Impaired  Patient reports R side intact L palmar surface a bit dull and tingly in comparison.  EDEMA: NS  MUSCLE TONE:   COGNITION: Overall cognitive status:  Patient does report some changes in memory  VISION: Subjective report: L eye wanders and doesn't focus (does not like to look in the mirror due to changes which are also noted and pointed out by other family members) Baseline vision: Bifocals Visual history: NA  VISION ASSESSMENT: Impaired Tracking/Visual pursuits: Decreased smoothness with horizontal tracking  Patient has difficulty with following activities due to following visual impairments: eye wanders  PERCEPTION: Not tested  PRAXIS: Not tested  Evaluation OBSERVATIONS: Patient is a well groomed lady who regularly has her nails manicured.  She has L pinkie finger often held in a tight hook position often with difficulty extending it actively.  Awkward positioning noted with pegboard test with L arm ie) alternating between turning wrist pronated/supinated at times.    TODAY'S TREATMENT:                                                                                                                               DATE:   Self Care: OT reviewed diplopia HEP as noted in pt instructions from 03/08/23 with patient able to report scanning with L and R eye occluded before scanning with both eyes simultaneously at home. Patient is provided a playing card to use at home for tracking and is encouraged to decreased distraction in the background via completing task towards a less distracting/blank wall or slightly reclining in recliner to be looking towards the ceiling (where she can also stabilize her head on the back of the chair or bed) to decreased tendency to move her head. Patient has difficulty with smooth pursuits but is able to identify when she tries to move her head instead of just her eyes.  Therapeutic Exercises:  Putty Exercises - 1 x daily - 10 reps  - Putty Squeezes - s/p trial with each hand, she was encouraged to only squeeze with R hand.  Instructions provided in handout as follows: Gently squeeze the putty (with RIGHT hand) using all of your fingers equally, and repeat.  Patient encouraged to not squeeze with LEFT hand to avoid trigger finger in L pinkie and not slightly ring finger.  - Rolling Putty on Table  - this one patient could do well with L fingers extended.  Instructions provided in handout as follows: Slowly roll the putty back and forth over the table (with LEFT hand) keeping her fingers extended especially pinkie finger.   PINCHES x3  - Tip PUSH with Putty   - 3-Point Pinch with Putty   - Key Pinch with Putty   Instructions provided in handout as follows: Pinch and hold putty with Left hand, Pinch and PULL the putty away with your Right hand.  On the LEFT side - try to keep your other fingers stretched out.  -  Removing Marbles from Putty  - Patient encouraged to try and find objects like marbles, dice, coins etc  PATIENT EDUCATION: Education details: Diplopia HEP & Putty Exercises Person educated: Patient Education method: Explanation, Demonstration, Tactile cues, Verbal  cues, and Handouts Education comprehension: verbalized understanding, returned demonstration, verbal cues required, tactile cues required, and needs further education  HOME EXERCISE PROGRAM: 03/01/23: Coordination activities with images provided 03/08/23: Diplopia HEP 03/10/23: Therapy Putty Exercises - Access Code: [email or web address]  GOALS: Goals reviewed with patient? Yes   GOALS:  SHORT TERM GOALS: Target date: 03/17/2023    Patient will demonstrate BUE HEP (ROM, strength & coordination) with 25% verbal cues or less for proper execution. Baseline: not yet initiated Goal status: IN PROGRESS  2.  Pt will verbalize understanding of adapted strategies/equipment to maximize safety and independence with ADLs/IADLs. Baseline: not yet initiated Goal status: INITIAL  3.  Patient will demonstrate at least 40 lbs R grip and 25 lbs L grip strength as needed to open jars and other containers.  Baseline: Evaluation Average: Right 37.7 lbs Left 21.7 lbs  Goal statues: IN PROGRESS  4. Patient will be aware of sensory stimulation and compensatory strategies for diminished sensation.  Baseline: Patient reports R side intact L palmar surface a bit dull and tingly in comparison.  Goal Status: INITIAL  5.  Pt will be able to place at least 5 more blocks with each hand with completion of Box and Blocks test. Baseline: Box and Blocks:  Right 22 blocks, Left 18 blocks Goal status: IN PROGRESS  6.  Pt will demonstrate use of binder for information organization of HEPs, MS resources and medical records. Baseline: not yet initiated - daughter present at eval for recommendation Goal status: IN PROGRESS  LONG TERM GOALS: Target date: 04/08/2023    Pt will verbalize understanding (or locate information in info binder) re: ways to prevent future MS related complications and MS community resources. Baseline: not yet initiated Goal status: INITIAL  2.  Pt will verbalize understanding of ways to keep thinking  skills sharp and ways to compensate for STM changes in the future. Baseline: not yet initiated Goal status: INITIAL  3.  Pt will demonstrate improved fine motor coordination for ADLs as evidenced by decreasing 9 hole peg test score for BUE by 10-15 secs. Baseline: 9 Hole Peg test: Right: 47.93 sec; Left: 2:00:03 sec Goal status: IN PROGRESS  4.  Pt will demonstrate improved positioning of L pinkie finger through proper day and/or nighttime splinting as deemed effective, comfortable and appropriate. Baseline: poor active extension of PIP joint Goal status: IN PROGRESS  5.  Patient will demonstrate at least 16% improvement with quick Dash score (reporting 18% disability or less) indicating improved functional use of affected extremity. Baseline: 34% disability Goal status: INITIAL      ASSESSMENT:  CLINICAL IMPRESSION:  Patient is a 72 y.o. female who was seen today for occupational therapy for UE dysfunction r/t hx of MS. Patient currently presents below baseline level of function demonstrating functional deficits in visual tracking, and L UE ROM/strength. Pt was provided some putty exercises to work on at home with modifications due to L trigger-finger tendencies of pinkie/ring finger.  Pt will continue to benefit from skilled OT services in the outpatient setting to work on impairments, compensatory strategies and provision of resources.   PERFORMANCE DEFICITS: in functional skills including ADLs, coordination, dexterity, sensation, edema, ROM, strength, pain, flexibility, Fine motor control, Gross motor control, mobility, balance, endurance, decreased knowledge of  use of DME, and UE functional use, cognitive skills including attention and memory, and psychosocial skills including coping strategies and routines and behaviors.   IMPAIRMENTS: are limiting patient from ADLs, rest and sleep, leisure, and social participation.   CO-MORBIDITIES: may have co-morbidities  that affects  occupational performance. Patient will benefit from skilled OT to address above impairments and improve overall function.  REHAB POTENTIAL: Good  PLAN:  OT FREQUENCY: 1-2x/week  OT DURATION: 6 weeks  PLANNED INTERVENTIONS: self care/ADL training, therapeutic exercise, therapeutic activity, neuromuscular re-education, balance training, functional mobility training, splinting, patient/family education, cognitive remediation/compensation, visual/perceptual remediation/compensation, energy conservation, coping strategies training, and DME and/or AE instructions  RECOMMENDED OTHER SERVICES: May need prescriptions for [facility] and new rollator.  Also may need eye exam.  CONSULTED AND AGREED WITH PLAN OF CARE: Patient and family member/caregiver  PLAN FOR NEXT SESSIONS: Review diplopia HEP as needed d/t Impaired perception evident by L lateral lean, difficulty coming to center with cards, and arced movement of card while horizontally moving to L.  Sent request for BSC/rollator replacement. Any updates?  MS Education BINDER ie) resources, AE, energy conservation  Will need HEP ideas for UE ROM, coordination, strength (putty done 8/1) and sensory stimulation/compensatory strategies  Splint considerations for L pinkie finger.   Victorino Sparrow, OT 03/10/2023, 2:35 PM

## 2023-03-14 ENCOUNTER — Ambulatory Visit: Payer: Medicare Other | Admitting: Physical Therapy

## 2023-03-14 DIAGNOSIS — M6281 Muscle weakness (generalized): Secondary | ICD-10-CM | POA: Diagnosis not present

## 2023-03-14 DIAGNOSIS — R278 Other lack of coordination: Secondary | ICD-10-CM | POA: Diagnosis not present

## 2023-03-14 DIAGNOSIS — R2681 Unsteadiness on feet: Secondary | ICD-10-CM

## 2023-03-14 DIAGNOSIS — R2689 Other abnormalities of gait and mobility: Secondary | ICD-10-CM

## 2023-03-14 DIAGNOSIS — R208 Other disturbances of skin sensation: Secondary | ICD-10-CM | POA: Diagnosis not present

## 2023-03-14 DIAGNOSIS — R41844 Frontal lobe and executive function deficit: Secondary | ICD-10-CM | POA: Diagnosis not present

## 2023-03-14 NOTE — Therapy (Signed)
OUTPATIENT PHYSICAL THERAPY NEURO TREATMENT   Patient Name: Jobyna Weimar MRN: [id number] DOB:April 14, 1951, 72 y.o., female Today's Date: 03/14/2023   PCP: Bethanie Dicker, NP REFERRING PROVIDER: Bethanie Dicker, NP  END OF SESSION:  PT End of Session - 03/14/23 1118     Visit Number 8    Number of Visits 17   with eval   Date for PT Re-Evaluation 04/28/23    Authorization Type Medicare    Progress Note Due on Visit 10    PT Start Time 1118   pt arrived late   PT Stop Time 1145    PT Time Calculation (min) 27 min    Equipment Utilized During Treatment Gait belt    Activity Tolerance Patient tolerated treatment well    Behavior During Therapy WFL for tasks assessed/performed                   Past Medical History:  Diagnosis Date   Asthma    Breast cancer (HCC) 2014   Right   Chicken pox    CKD (chronic kidney disease), stage III (HCC)    COVID-19 virus infection 12/2020   Hypertension    Multiple sclerosis (HCC)    Seizures (HCC)    Past Surgical History:  Procedure Laterality Date   BREAST SURGERY     MASTECTOMY Right 2014   Patient Active Problem List   Diagnosis Date Noted   Spinal cord disease (HCC) 02/01/2023   Abnormal MRI, cervical spine (06/06/2020 & 11/13/2022) 02/01/2023   Demyelinating disease of central nervous system (HCC) 02/01/2023   Chronic radicular pain of lower extremity (S1) (Bilateral) 02/01/2023   Lower extremity burning pain 02/01/2023   Chronic low back pain (2ry area of Pain) (Bilateral) (R>L) w/ sciatica (Bilateral) 02/01/2023   Chronic hip pain (Bilateral) (L>R) 02/01/2023   Chronic upper extremity pain (intermittent) (Bilateral) 02/01/2023   Allergic rhinitis 01/14/2023   CKD (chronic kidney disease), stage III (HCC)    COVID-19 virus infection 12/2020   Myelopathy (HCC) 09/25/2020   History of right breast cancer 03/13/2020   Status post right mastectomy 03/13/2020   Abnormal gait 09/15/2018   Claustrophobia 09/15/2018    Hypertension 09/15/2018   Focal epilepsy (HCC) 09/15/2018   Multiple sclerosis (HCC) 09/15/2018   Paresthesia 09/15/2018   Chronic lower extremity pain (1ry area of Pain) (Bilateral) (R>L) 12/31/2015   Seizure (HCC) 12/31/2015   Neuropathic pain 06/02/2015   No advance directives 11/02/2012   Refusal of blood transfusion for reasons of conscience 11/02/2012   Malignant neoplasm of upper-outer quadrant of right breast in female, estrogen receptor positive (HCC) 11/01/2012    ONSET DATE: 01/14/2023 (referral date)  REFERRING DIAG: G35 (ICD-10-CM) - Multiple sclerosis (HCC) R26.9 (ICD-10-CM) - Abnormal gait M62.81 (ICD-10-CM) - Generalized muscle weakness  THERAPY DIAG:  Muscle weakness (generalized)  Unsteadiness on feet  Other abnormalities of gait and mobility  Rationale for Evaluation and Treatment: Rehabilitation  SUBJECTIVE:  SUBJECTIVE STATEMENT: Pt reports no acute changes since last visit, no falls. Pt reports ongoing chronic pain, looks forwards to the day she has no pain. Pt has been working on sit to stands at home and they are very difficult.  Pt accompanied by: self and family member daughter [name] (outside)  PERTINENT HISTORY: relapsing remitting MS and breast CA and seizures  PAIN:  Are you having pain? Yes: NPRS scale: 4-5/10 Pain location: legs, hips down into thighs Pain description: pins and needles, burning sensation Aggravating factors: hard to tell just gets bad sometimes Relieving factors: pain medication, sometimes movement  Took pain medicine before she came today.  PRECAUTIONS: Fall  WEIGHT BEARING RESTRICTIONS: No  FALLS: Has patient fallen in last 6 months? No, several near falls though  LIVING ENVIRONMENT: Lives with: lives with their daughter [name] Lives  in: House/apartment Stairs: No Has following equipment at home: Counselling psychologist, Environmental consultant - 4 wheeled, and shower chair *needs new rollator and needs a BSC  PLOF: Independent with basic ADLs, Independent with gait, Independent with transfers, Requires assistive device for independence, and Needs assistance with homemaking  PATIENT GOALS: "to help me move around a little bit better and improve my balance, less pain"  OBJECTIVE:   DIAGNOSTIC FINDINGS:  B hip xrays 02/01/23: FINDINGS: SI joints are patent. Pubic symphysis and rami appear intact. Mild bilateral hip degenerative changes with minimal spurring and joint space narrowing   IMPRESSION: Mild bilateral hip degenerative changes.  Lumbar spine xray 02/01/23: FINDINGS: Five non rib-bearing lumbar type vertebra. Lumbar alignment within normal limits. Vertebral body heights are maintained. No suspicious change in alignment with flexion or extension. Mild disc space narrowing L2-L3 and L4-L5. Moderate facet degenerative changes worst at L4-L5 and L5-S1.   IMPRESSION: Multilevel degenerative changes as described above.  COGNITION: Overall cognitive status: Impaired "I'm not as sharp as I used to be"   SENSATION: Decreased light touch in R lateral knee  EDEMA:  Swelling in L knee most of the time, occasionally in L ankle  POSTURE: rounded shoulders, forward head, and posterior pelvic tilt  LOWER EXTREMITY ROM:     Passive  Right Eval Left Eval  Hip flexion   [facility]   [facility]  Hip extension    Hip abduction    Hip adduction    Hip internal rotation    Hip external rotation    Knee flexion    Knee extension    Ankle dorsiflexion    Ankle plantarflexion    Ankle inversion    Ankle eversion     (Blank rows = not tested)  LOWER EXTREMITY MMT:    MMT Right Eval Left Eval  Hip flexion 5 2-  Hip extension    Hip abduction    Hip adduction    Hip internal rotation    Hip external rotation    Knee flexion 5  2-  Knee extension 5 3  Ankle dorsiflexion 4 3  Ankle plantarflexion    Ankle inversion    Ankle eversion    (Blank rows = not tested)   TODAY'S TREATMENT:  TherAct Sit to stands x 10 reps from chair with use of BUE support Attempt sit to stand from chair with hands on thighs, unable to stand without min A Sit to stand from chair with elevated seat (airex under patient) x 10 reps with hands on thighs Pt does continue to exhibit poor eccentric control when sitting in a lower chair  TherEx Standing at countertop with BUE suppor to work on BLE strengthening: Standing hip abd 2 x 10 reps B Standing hip ext 2 x 10 reps B Cues to keep L knee "unlocked" in stance to prevent hyperextension  Added to HEP, see bolded below    PATIENT EDUCATION: Education details: Continue practicing sit to stands at home, continue working on HEP-added to [employer] educated: Patient Education method: Programmer, multimedia, Facilities manager, and Handouts Education comprehension: verbalized understanding, returned demonstration, and needs further education  HOME EXERCISE PROGRAM: From prior POC: Access Code: [email or web address] URL: [email or web address] Date: 02/15/2023 Prepared by: Peter Congo  Exercises - Sit to Stand Without Arm Support  - 1 x daily - 7 x weekly - 1 sets - 10 reps - Standing March with Counter Support  - 1 x daily - 7 x weekly - 1 sets - 10 reps - Standing Hip Abduction with Counter Support  - 1 x daily - 7 x weekly - 1 sets - 10 reps - Standing Hip Extension with Counter Support  - 1 x daily - 7 x weekly - 1 sets - 10 reps - Side Stepping with Counter Support  - 1 x daily - 7 x weekly - 1 sets - 10 reps - Seated Ankle Pumps  - 1 x daily - 7 x weekly - 3 sets - 10 reps - Standing Ankle Dorsiflexion Stretch  - 1 x daily - 7 x weekly - 3 sets - 10 reps - 30 second  hold  - Supine March  - 1 x daily - 7 x weekly - 3 sets - 5-10 reps - Supine Bridge  - 1 x daily - 7 x weekly - 1 sets - 10 reps  Black = reviewed  Red = previous POC, not reviewed yet  GOALS: Goals reviewed with patient? Yes  SHORT TERM GOALS: Target date: 03/03/2023  Pt will be independent with initial HEP for improved strength, balance, transfers and gait. Baseline: Goal status:  MET  2.  Pt will improve 5 x STS to less than or equal to 18 seconds to demonstrate improved functional strength and transfer efficiency with BUE support as needed. Baseline: 21.15 sec with heavy UE reliance on arms of chair (6/27), 19.34 sec with BUE support on arms of chair (7/30) Goal status: IN PROGRESS  3.  Pt will improve normal TUG to less than or equal to 35 seconds for improved functional mobility and decreased fall risk with LRAD. Baseline: 40.37 sec with SBQC (6/27), 35 sec with SBQC (7/30) Goal status: MET  4.  Pt will improve gait velocity to at least 1.0 ft/sec for improved gait efficiency and performance at mod I level with LRAD. Baseline: 0.83 ft/sec with SBQC (6/27), 1.13 ft/sec with SBQC (7/30) Goal status: MET   LONG TERM GOALS: Target date: 04/07/2023   Pt will be independent with final HEP for improved strength, balance, transfers and gait. Baseline:  Goal status: INITIAL  2.  Pt will improve 5 x STS to less than or equal to 18 seconds to demonstrate improved functional strength and transfer efficiency with decreased BUE support.  Baseline: 21.15 sec  with heavy UE reliance on arms of chair (6/27), 19.34 sec with BUE support on arms of chair (7/30) Goal status: IREVISED  3.  Pt will improve normal TUG to less than or equal to 30 seconds for improved functional mobility and decreased fall risk with LRAD. Baseline: 40.37 sec with SBQC (6/27), 35 sec with SBQC (7/30) Goal status: INITIAL  4.  Pt will improve gait velocity to at least 1.25 ft/sec for improved gait efficiency and  performance at mod I level with LRAD Baseline: 0.83 ft/sec with SBQC (6/27), 1.13 ft/sec with SBQC (7/30) Goal status: INITIAL  5.  Pt will improve her score on the MSIS-29 to </= 95 points to demonstrate decreased disability level. Baseline: 105/145 (6/27) Goal status: INITIAL    ASSESSMENT:  CLINICAL IMPRESSION: Session limited by patient's late arrival. Emphasis of skilled PT session on continuing to work on sit to stands with decreased UE support and reviewing standing LE strengthening exercises for HEP. Pt continues to exhibit difficulty with sit to stands with decreased UE support and with difficulty with anterior weight shift during transfer as well as poor eccentric control when sitting. Pt with difficulty initially performing standing strengthening exercises without locking out her L knee into hyperextension, exhibits improved limb positioning with verbal cues for "soft knee" and to keep knee unlocked. Pt continues to benefit from skilled therapy services to work towards decreasing her fall risk and improving her balance and LE strength. Continue POC.     OBJECTIVE IMPAIRMENTS: Abnormal gait, cardiopulmonary status limiting activity, decreased activity tolerance, decreased balance, decreased endurance, decreased mobility, difficulty walking, decreased strength, increased edema, impaired sensation, impaired UE functional use, and pain.   ACTIVITY LIMITATIONS: carrying, lifting, bending, standing, squatting, stairs, transfers, and bed mobility  PARTICIPATION LIMITATIONS: meal prep, cleaning, laundry, driving, and community activity  PERSONAL FACTORS: Age, Sex, Time since onset of injury/illness/exacerbation, Transportation, and 1-2 comorbidities: RRMS, cancer, seizures  are also affecting patient's functional outcome.   REHAB POTENTIAL: Fair time since onset of MS, previous bouts of therapy  CLINICAL DECISION MAKING: Stable/uncomplicated  EVALUATION COMPLEXITY:  Moderate  PLAN:  PT FREQUENCY: 2x/week  PT DURATION: 8 weeks  PLANNED INTERVENTIONS: Therapeutic exercises, Therapeutic activity, Neuromuscular re-education, Balance training, Gait training, Patient/Family education, Self Care, Joint mobilization, Stair training, Vestibular training, Canalith repositioning, Visual/preceptual remediation/compensation, Orthotic/Fit training, DME instructions, Aquatic Therapy, Dry Needling, Electrical stimulation, Cryotherapy, Moist heat, Taping, Manual therapy, and Re-evaluation  PLAN FOR NEXT SESSION: Update HEP for more challenging LLE strengthening and NMR, balance, endurance, gait training with rollator, (Pt's old genu recurvatum brace is in [place] desk), anterior weight shift with sit to stands, squats?  Could also try estim or bioness in future sessions (let her know when so she can wear shorts)      Peter Congo, PT, DPT, CSRS  03/14/2023, 11:45 AM

## 2023-03-15 ENCOUNTER — Ambulatory Visit: Payer: Medicare Other | Admitting: Occupational Therapy

## 2023-03-15 DIAGNOSIS — R2681 Unsteadiness on feet: Secondary | ICD-10-CM | POA: Diagnosis not present

## 2023-03-15 DIAGNOSIS — R41844 Frontal lobe and executive function deficit: Secondary | ICD-10-CM | POA: Diagnosis not present

## 2023-03-15 DIAGNOSIS — M6281 Muscle weakness (generalized): Secondary | ICD-10-CM

## 2023-03-15 DIAGNOSIS — R278 Other lack of coordination: Secondary | ICD-10-CM

## 2023-03-15 DIAGNOSIS — R208 Other disturbances of skin sensation: Secondary | ICD-10-CM | POA: Diagnosis not present

## 2023-03-15 DIAGNOSIS — R2689 Other abnormalities of gait and mobility: Secondary | ICD-10-CM | POA: Diagnosis not present

## 2023-03-15 NOTE — Therapy (Unsigned)
OUTPATIENT OCCUPATIONAL THERAPY NEURO TREATMENT  Patient Name: Selena Bender MRN: [id number] DOB:1951/04/17, 72 y.o., female Today's Date: 03/15/2023  PCP: Bethanie Dicker, NP REFERRING PROVIDER: Bethanie Dicker, NP   END OF SESSION:  OT End of Session - 03/15/23 1236     Visit Number 5    Number of Visits 12   + evaluation   Date for OT Re-Evaluation 04/15/23    Authorization Type Medicare and [employer] VL 60    Progress Note Due on Visit 10    OT Start Time 1234    OT Stop Time 1315    OT Time Calculation (min) 41 min    Equipment Utilized During Treatment Textured Balls    Activity Tolerance Patient tolerated treatment well    Behavior During Therapy [facility] for tasks assessed/performed             Past Medical History:  Diagnosis Date   Asthma    Breast cancer (HCC) 2014   Right   Chicken pox    CKD (chronic kidney disease), stage III (HCC)    COVID-19 virus infection 12/2020   Hypertension    Multiple sclerosis (HCC)    Seizures (HCC)    Past Surgical History:  Procedure Laterality Date   BREAST SURGERY     MASTECTOMY Right 2014   Patient Active Problem List   Diagnosis Date Noted   Spinal cord disease (HCC) 02/01/2023   Abnormal MRI, cervical spine (06/06/2020 & 11/13/2022) 02/01/2023   Demyelinating disease of central nervous system (HCC) 02/01/2023   Chronic radicular pain of lower extremity (S1) (Bilateral) 02/01/2023   Lower extremity burning pain 02/01/2023   Chronic low back pain (2ry area of Pain) (Bilateral) (R>L) w/ sciatica (Bilateral) 02/01/2023   Chronic hip pain (Bilateral) (L>R) 02/01/2023   Chronic upper extremity pain (intermittent) (Bilateral) 02/01/2023   Allergic rhinitis 01/14/2023   CKD (chronic kidney disease), stage III (HCC)    COVID-19 virus infection 12/2020   Myelopathy (HCC) 09/25/2020   History of right breast cancer 03/13/2020   Status post right mastectomy 03/13/2020   Abnormal gait 09/15/2018   Claustrophobia 09/15/2018    Hypertension 09/15/2018   Focal epilepsy (HCC) 09/15/2018   Multiple sclerosis (HCC) 09/15/2018   Paresthesia 09/15/2018   Chronic lower extremity pain (1ry area of Pain) (Bilateral) (R>L) 12/31/2015   Seizure (HCC) 12/31/2015   Neuropathic pain 06/02/2015   No advance directives 11/02/2012   Refusal of blood transfusion for reasons of conscience 11/02/2012   Malignant neoplasm of upper-outer quadrant of right breast in female, estrogen receptor positive (HCC) 11/01/2012    ONSET DATE: 02/04/2023  REFERRING DIAG: N82.956 (ICD-10-CM) - Left hand weakness G35 (ICD-10-CM) - Multiple sclerosis (HCC)  THERAPY DIAG:  Other lack of coordination  Muscle weakness (generalized)  Other disturbances of skin sensation  Rationale for Evaluation and Treatment: Rehabilitation  SUBJECTIVE:   SUBJECTIVE STATEMENT: Patient reported that she missed her PT visit earlier due to her daughter misplacing her keys.  She reports that she has been working on her eye exercises at home.   Pt accompanied by: self   PERTINENT HISTORY:   Pt is a 72 yr old AA female with hx of relapsing remitting MS and breast CA with mastectomy as well as seizures, chronic pain in B legs/arms and a lot of nerve pain.  PRECAUTIONS: None  WEIGHT BEARING RESTRICTIONS: No  PAIN:  Are you having pain? Yes: NPRS scale: 6/10 Pain location: legs all the way to feet and arms all  the way to her hands; legs >arms Pain description: nerve pains - pins and needles Aggravating factors: hard to say - sometimes movement is fine but sometimes it makes her feel better Relieving factors: unknown  FALLS: Has patient fallen in last 6 months? No  LIVING ENVIRONMENT: Lives with: lives with their daughter Lives in: Apartment Stairs: No Has following equipment at home: Counselling psychologist, Environmental consultant - 4 wheeled, shower chair, Grab bars, and has grab bar that needs to be installed as the suction grab bar came off the wall during a shower a  few months ago - also may need elevated BSC  PLOF: Independent with basic ADLs, Independent with household mobility with device, and Needs assistance with homemaking  PATIENT GOALS: Use my hands better.  OBJECTIVE:   HAND DOMINANCE: Right  ADLs: Overall ADLs: Pt generally MI Transfers/ambulation related to ADLs: MI with AE Eating: I Grooming: Gets hair/nails done professionally UB Dressing: Sits down to get dressed LB Dressing: Able to do it with a little bit of a struggle but "I can still do it." Toileting: MI Bathing: Showers 2x/week Tub Shower transfers: Supervision - needs grab bar replaced Equipment: Shower seat with back, Walk in shower, and Reacher  IADLs: Shopping: I go shopping sometimes (does not drive). Light housekeeping: I do my own laundry Meal Prep: Daughter does most of it. Every now and then I get in the kitchen. Community mobility: Uses cane in public and walker at home (needs a new rollator due to poor brakes etc) - may need rx for new walker Medication management: Self managed with pill bottles - only uses a pill box only when she goes away/vacation Financial management: Self managed Handwriting: 50% legible   MOBILITY STATUS: Needs Assist: uses AE - rollator at home or cane in public  POSTURE COMMENTS:  No Significant postural limitations Sitting balance: WFL  ACTIVITY TOLERANCE: Activity tolerance: Patient reports only doing 1 thing/day ie) shopping, appt or getting nails done.  Needs to take breaks ie) when doing her hair.  FUNCTIONAL OUTCOME MEASURES: Quick Dash: 34.1 % disability   UPPER EXTREMITY ROM:    Active ROM Right eval Left eval  Shoulder flexion [facility]   Shoulder abduction    Shoulder adduction    Shoulder extension    Shoulder internal rotation    Shoulder external rotation    Elbow flexion    Elbow extension  Decreased end range  Wrist flexion    Wrist extension    Wrist ulnar deviation    Wrist radial deviation    Wrist  pronation    Wrist supination    (Blank rows = not tested)  UPPER EXTREMITY MMT:     MMT Right eval Left eval  Shoulder flexion 4/5 3/5  Shoulder abduction    Shoulder adduction    Shoulder extension    Shoulder internal rotation    Shoulder external rotation    Middle trapezius    Lower trapezius    Elbow flexion    Elbow extension    Wrist flexion    Wrist extension    Wrist ulnar deviation    Wrist radial deviation    Wrist pronation    Wrist supination    (Blank rows = not tested)  HAND FUNCTION: Grip strength: Right: 33.2, 44.9, 35.0  lbs; Left: 19.8, 23.5, 21.8 lbs Evaluation Average: Right 37.7 lbs Left 21.7 lbs  COORDINATION: 9 Hole Peg test: Right: 47.93 sec; Left: 2:00:03 sec Box and Blocks:  Right 22  blocks, Left 18 blocks  SENSATION: Light touch: Impaired  Patient reports R side intact L palmar surface a bit dull and tingly in comparison.  EDEMA: NS  MUSCLE TONE:   COGNITION: Overall cognitive status:  Patient does report some changes in memory  VISION: Subjective report: L eye wanders and doesn't focus (does not like to look in the mirror due to changes which are also noted and pointed out by other family members) Baseline vision: Bifocals Visual history: NA  VISION ASSESSMENT: Impaired Tracking/Visual pursuits: Decreased smoothness with horizontal tracking  Patient has difficulty with following activities due to following visual impairments: eye wanders  PERCEPTION: Not tested  PRAXIS: Not tested  Evaluation OBSERVATIONS: Patient is a well groomed lady who regularly has her nails manicured.  She has L pinkie finger often held in a tight hook position often with difficulty extending it actively.  Awkward positioning noted with pegboard test with L arm ie) alternating between turning wrist pronated/supinated at times.    TODAY'S TREATMENT:                                                                                                                               DATE:   Self Care: OT reviewed diplopia HEP as noted in pt instructions from 03/08/23 with patient able to report scanning with L and R eye occluded before scanning with both eyes simultaneously at home. Patient is provided a playing card to use at home for tracking and is encouraged to decreased distraction in the background via completing task towards a less distracting/blank wall or slightly reclining in recliner to be looking towards the ceiling (where she can also stabilize her head on the back of the chair or bed) to decreased tendency to move her head. Patient has difficulty with smooth pursuits but is able to identify when she tries to move her head instead of just her eyes.  Therapeutic Exercises:  Exercises - Wrist Flexion Extension AROM - Palms Down  - 1 x daily - 10 reps - Seated Finger MP Extension AROM with Blocking  - 1 x daily - 10 reps - Seated Finger Adduction and Extension with Towel  - 1 x daily - 10 reps  PATIENT EDUCATION: Education details: Diplopia HEP & Putty Exercises Person educated: Patient Education method: Explanation, Demonstration, Tactile cues, Verbal cues, and Handouts Education comprehension: verbalized understanding, returned demonstration, verbal cues required, tactile cues required, and needs further education  HOME EXERCISE PROGRAM: 03/01/23: Coordination activities with images provided 03/08/23: Diplopia HEP 03/10/23: Therapy Putty Exercises - Access Code: [email or web address] 03/15/23: Wrist and Digital extension - Access Code: [email or web address]   Exercises - Wrist Flexion Extension AROM - Palms Down  - 1 x daily - 10 reps - Seated Finger MP Extension AROM with Blocking  - 1 x daily - 10 reps - Seated Finger Adduction and Extension with Towel  - 1 x daily - 10 reps  GOALS: Goals reviewed with patient? Yes  GOALS:  SHORT TERM GOALS: Target date: 03/17/2023    Patient will demonstrate BUE HEP (ROM, strength & coordination) with 25% verbal cues or  less for proper execution. Baseline: not yet initiated Goal status: IN PROGRESS  2.  Pt will verbalize understanding of adapted strategies/equipment to maximize safety and independence with ADLs/IADLs. Baseline: not yet initiated Goal status: INITIAL  3.  Patient will demonstrate at least 40 lbs R grip and 25 lbs L grip strength as needed to open jars and other containers.  Baseline: Evaluation Average: Right 37.7 lbs Left 21.7 lbs  Goal statues: IN PROGRESS  4. Patient will be aware of sensory stimulation and compensatory strategies for diminished sensation.  Baseline: Patient reports R side intact L palmar surface a bit dull and tingly in comparison.  Goal Status: INITIAL  5.  Pt will be able to place at least 5 more blocks with each hand with completion of Box and Blocks test. Baseline: Box and Blocks:  Right 22 blocks, Left 18 blocks Goal status: IN PROGRESS  6.  Pt will demonstrate use of binder for information organization of HEPs, MS resources and medical records. Baseline: not yet initiated - daughter present at eval for recommendation Goal status: IN PROGRESS  LONG TERM GOALS: Target date: 04/08/2023    Pt will verbalize understanding (or locate information in info binder) re: ways to prevent future MS related complications and MS community resources. Baseline: not yet initiated Goal status: INITIAL  2.  Pt will verbalize understanding of ways to keep thinking skills sharp and ways to compensate for STM changes in the future. Baseline: not yet initiated Goal status: INITIAL  3.  Pt will demonstrate improved fine motor coordination for ADLs as evidenced by decreasing 9 hole peg test score for BUE by 10-15 secs. Baseline: 9 Hole Peg test: Right: 47.93 sec; Left: 2:00:03 sec Goal status: IN PROGRESS  4.  Pt will demonstrate improved positioning of L pinkie finger through proper day and/or nighttime splinting as deemed effective, comfortable and appropriate. Baseline:  poor active extension of PIP joint Goal status: IN PROGRESS  5.  Patient will demonstrate at least 16% improvement with quick Dash score (reporting 18% disability or less) indicating improved functional use of affected extremity. Baseline: 34% disability Goal status: INITIAL      ASSESSMENT:  CLINICAL IMPRESSION:  Patient is a 72 y.o. female who was seen today for occupational therapy for UE dysfunction r/t hx of MS. Patient currently presents below baseline level of function demonstrating functional deficits in visual tracking, and L UE ROM/strength. Pt was provided some putty exercises to work on at home with modifications due to L trigger-finger tendencies of pinkie/ring finger.  Pt will continue to benefit from skilled OT services in the outpatient setting to work on impairments, compensatory strategies and provision of resources.   PERFORMANCE DEFICITS: in functional skills including ADLs, coordination, dexterity, sensation, edema, ROM, strength, pain, flexibility, Fine motor control, Gross motor control, mobility, balance, endurance, decreased knowledge of use of DME, and UE functional use, cognitive skills including attention and memory, and psychosocial skills including coping strategies and routines and behaviors.   IMPAIRMENTS: are limiting patient from ADLs, rest and sleep, leisure, and social participation.   CO-MORBIDITIES: may have co-morbidities  that affects occupational performance. Patient will benefit from skilled OT to address above impairments and improve overall function.  REHAB POTENTIAL: Good  PLAN:  OT FREQUENCY: 1-2x/week  OT DURATION: 6 weeks  PLANNED INTERVENTIONS: self care/ADL training, therapeutic exercise, therapeutic  activity, neuromuscular re-education, balance training, functional mobility training, splinting, patient/family education, cognitive remediation/compensation, visual/perceptual remediation/compensation, energy conservation, coping strategies  training, and DME and/or AE instructions  RECOMMENDED OTHER SERVICES: May need prescriptions for [facility] and new rollator.  Also may need eye exam.  CONSULTED AND AGREED WITH PLAN OF CARE: Patient and family member/caregiver  PLAN FOR NEXT SESSIONS: Review diplopia HEP as needed d/t Impaired perception evident by L lateral lean, difficulty coming to center with cards, and arced movement of card while horizontally moving to L.  Sent request for BSC/rollator replacement. Any updates?  MS Education BINDER ie) resources, AE, energy conservation  Will need HEP ideas for UE ROM, coordination, strength (putty done 8/1) and sensory stimulation/compensatory strategies  Splint considerations for L pinkie finger.   Victorino Sparrow, OT 03/15/2023, 1:26 PM

## 2023-03-15 NOTE — Patient Instructions (Addendum)
Access Code: [email or web address] URL: [email or web address] Date: 03/15/2023 Prepared by: Amada Kingfisher  Exercises - Wrist Flexion Extension AROM - Palms Down  - 1 x daily - 10 reps - Seated Finger MP Extension AROM with Blocking  - 1 x daily - 10 reps - Seated Finger Adduction and Extension with Towel  - 1 x daily - 10 reps

## 2023-03-16 ENCOUNTER — Ambulatory Visit: Payer: Medicare Other | Admitting: Physical Therapy

## 2023-03-16 DIAGNOSIS — R208 Other disturbances of skin sensation: Secondary | ICD-10-CM | POA: Diagnosis not present

## 2023-03-16 DIAGNOSIS — R41844 Frontal lobe and executive function deficit: Secondary | ICD-10-CM | POA: Diagnosis not present

## 2023-03-16 DIAGNOSIS — R278 Other lack of coordination: Secondary | ICD-10-CM | POA: Diagnosis not present

## 2023-03-16 DIAGNOSIS — M6281 Muscle weakness (generalized): Secondary | ICD-10-CM

## 2023-03-16 DIAGNOSIS — G8929 Other chronic pain: Secondary | ICD-10-CM

## 2023-03-16 DIAGNOSIS — R2681 Unsteadiness on feet: Secondary | ICD-10-CM | POA: Diagnosis not present

## 2023-03-16 DIAGNOSIS — R2689 Other abnormalities of gait and mobility: Secondary | ICD-10-CM | POA: Diagnosis not present

## 2023-03-16 NOTE — Therapy (Signed)
OUTPATIENT PHYSICAL THERAPY NEURO TREATMENT   Patient Name: Selena Bender MRN: [id number] DOB:[birth year], 72 y.o., female Today's Date: 03/16/2023   PCP: Bethanie Dicker, NP REFERRING PROVIDER: Bethanie Dicker, NP  END OF SESSION:  PT End of Session - 03/16/23 1113     Visit Number 9    Number of Visits 17   with eval   Date for PT Re-Evaluation 04/28/23    Authorization Type Medicare    Progress Note Due on Visit 10    PT Start Time 1109   Pt arrived late   PT Stop Time 1148    PT Time Calculation (min) 39 min    Equipment Utilized During Treatment Gait belt    Activity Tolerance Patient tolerated treatment well    Behavior During Therapy WFL for tasks assessed/performed                   Past Medical History:  Diagnosis Date   Asthma    Breast cancer (HCC) 2014   Right   Chicken pox    CKD (chronic kidney disease), stage III (HCC)    COVID-19 virus infection 12/2020   Hypertension    Multiple sclerosis (HCC)    Seizures (HCC)    Past Surgical History:  Procedure Laterality Date   BREAST SURGERY     MASTECTOMY Right 2014   Patient Active Problem List   Diagnosis Date Noted   Spinal cord disease (HCC) 02/01/2023   Abnormal MRI, cervical spine (06/06/2020 & 11/13/2022) 02/01/2023   Demyelinating disease of central nervous system (HCC) 02/01/2023   Chronic radicular pain of lower extremity (S1) (Bilateral) 02/01/2023   Lower extremity burning pain 02/01/2023   Chronic low back pain (2ry area of Pain) (Bilateral) (R>L) w/ sciatica (Bilateral) 02/01/2023   Chronic hip pain (Bilateral) (L>R) 02/01/2023   Chronic upper extremity pain (intermittent) (Bilateral) 02/01/2023   Allergic rhinitis 01/14/2023   CKD (chronic kidney disease), stage III (HCC)    COVID-19 virus infection 12/2020   Myelopathy (HCC) 09/25/2020   History of right breast cancer 03/13/2020   Status post right mastectomy 03/13/2020   Abnormal gait 09/15/2018   Claustrophobia 09/15/2018    Hypertension 09/15/2018   Focal epilepsy (HCC) 09/15/2018   Multiple sclerosis (HCC) 09/15/2018   Paresthesia 09/15/2018   Chronic lower extremity pain (1ry area of Pain) (Bilateral) (R>L) 12/31/2015   Seizure (HCC) 12/31/2015   Neuropathic pain 06/02/2015   No advance directives 11/02/2012   Refusal of blood transfusion for reasons of conscience 11/02/2012   Malignant neoplasm of upper-outer quadrant of right breast in female, estrogen receptor positive (HCC) 11/01/2012    ONSET DATE: 01/14/2023 (referral date)  REFERRING DIAG: G35 (ICD-10-CM) - Multiple sclerosis (HCC) R26.9 (ICD-10-CM) - Abnormal gait M62.81 (ICD-10-CM) - Generalized muscle weakness  THERAPY DIAG:  Muscle weakness (generalized)  Unsteadiness on feet  Other abnormalities of gait and mobility  Chronic pain of left knee  Rationale for Evaluation and Treatment: Rehabilitation  SUBJECTIVE:  SUBJECTIVE STATEMENT: Pt reports no acute changes since last visit, no falls. Pt reports she will need to find new transportation soon because [name] is looking for a job. Having more pain today, likely due to weather.   Pt accompanied by: self and family member daughter [name] (outside)  PERTINENT HISTORY: relapsing remitting MS and breast CA and seizures  PAIN:  Are you having pain? Yes: NPRS scale: 7-8/10 Pain location: legs, hips down into thighs Pain description: pins and needles, burning sensation Aggravating factors: hard to tell just gets bad sometimes Relieving factors: pain medication, sometimes movement  Took pain medicine before she came today.  PRECAUTIONS: Fall  WEIGHT BEARING RESTRICTIONS: No  FALLS: Has patient fallen in last 6 months? No, several near falls though  LIVING ENVIRONMENT: Lives with: lives with their  daughter [name] Lives in: House/apartment Stairs: No Has following equipment at home: Counselling psychologist, Environmental consultant - 4 wheeled, and shower chair *needs new rollator and needs a BSC  PLOF: Independent with basic ADLs, Independent with gait, Independent with transfers, Requires assistive device for independence, and Needs assistance with homemaking  PATIENT GOALS: "to help me move around a little bit better and improve my balance, less pain"  OBJECTIVE:   DIAGNOSTIC FINDINGS:  B hip xrays 02/01/23: FINDINGS: SI joints are patent. Pubic symphysis and rami appear intact. Mild bilateral hip degenerative changes with minimal spurring and joint space narrowing   IMPRESSION: Mild bilateral hip degenerative changes.  Lumbar spine xray 02/01/23: FINDINGS: Five non rib-bearing lumbar type vertebra. Lumbar alignment within normal limits. Vertebral body heights are maintained. No suspicious change in alignment with flexion or extension. Mild disc space narrowing L2-L3 and L4-L5. Moderate facet degenerative changes worst at L4-L5 and L5-S1.   IMPRESSION: Multilevel degenerative changes as described above.  COGNITION: Overall cognitive status: Impaired "I'm not as sharp as I used to be"   SENSATION: Decreased light touch in R lateral knee  EDEMA:  Swelling in L knee most of the time, occasionally in L ankle  POSTURE: rounded shoulders, forward head, and posterior pelvic tilt  LOWER EXTREMITY ROM:     Passive  Right Eval Left Eval  Hip flexion   [facility]   [facility]  Hip extension    Hip abduction    Hip adduction    Hip internal rotation    Hip external rotation    Knee flexion    Knee extension    Ankle dorsiflexion    Ankle plantarflexion    Ankle inversion    Ankle eversion     (Blank rows = not tested)  LOWER EXTREMITY MMT:    MMT Right Eval Left Eval  Hip flexion 5 2-  Hip extension    Hip abduction    Hip adduction    Hip internal rotation    Hip external rotation     Knee flexion 5 2-  Knee extension 5 3  Ankle dorsiflexion 4 3  Ankle plantarflexion    Ankle inversion    Ankle eversion    (Blank rows = not tested)   TODAY'S TREATMENT:  Ther Ex SciFit multi-peaks level 2.5 for 8 minutes using BUE/BLEs for neural priming for reciprocal movement, dynamic cardiovascular warmup and increased amplitude of stepping. Min cues to avoid hyperextension of L knee, as pt had single instance of getting knee locked in hyperextension and unable to unlock knee without assistance. RPE of 4/10 following activity   NMR  In // bars, 6 Blaze pods on random reach setting for improved LE coordination, single leg stability and BLE strength.  Performed on 2 minute intervals with 1 minute rest periods.  Pt requires SBA guarding and BUE support. Round 1:  lateral setup.  29 hits. Round 2:  lateral setup on blue balance beam.  14 hits. Round 3:  lateral setup on blue balance beam navigating two 4" hurdles.  5 hits. "That was hard" Notable errors/deficits:  Min cues to pick up feet rather than drag feet. Inability to lift LLE over hurdles, so pt frequently going around them rather than over    PATIENT EDUCATION: Education details: Continue practicing sit to stands at home, continue working on [employer]  Person educated: Patient Education method: Programmer, multimedia, Facilities manager, and Handouts Education comprehension: verbalized understanding, returned demonstration, and needs further education  HOME EXERCISE PROGRAM: From prior POC: Access Code: [email or web address] URL: [email or web address] Date: 02/15/2023 Prepared by: Peter Congo  Exercises - Sit to Stand Without Arm Support  - 1 x daily - 7 x weekly - 1 sets - 10 reps - Standing March with Counter Support  - 1 x daily - 7 x weekly - 1 sets - 10 reps - Standing Hip Abduction with Counter Support  -  1 x daily - 7 x weekly - 1 sets - 10 reps - Standing Hip Extension with Counter Support  - 1 x daily - 7 x weekly - 1 sets - 10 reps - Side Stepping with Counter Support  - 1 x daily - 7 x weekly - 1 sets - 10 reps - Seated Ankle Pumps  - 1 x daily - 7 x weekly - 3 sets - 10 reps - Standing Ankle Dorsiflexion Stretch  - 1 x daily - 7 x weekly - 3 sets - 10 reps - 30 second hold  - Supine March  - 1 x daily - 7 x weekly - 3 sets - 5-10 reps - Supine Bridge  - 1 x daily - 7 x weekly - 1 sets - 10 reps  Black = reviewed  Red = previous POC, not reviewed yet  GOALS: Goals reviewed with patient? Yes  SHORT TERM GOALS: Target date: 03/03/2023  Pt will be independent with initial HEP for improved strength, balance, transfers and gait. Baseline: Goal status:  MET  2.  Pt will improve 5 x STS to less than or equal to 18 seconds to demonstrate improved functional strength and transfer efficiency with BUE support as needed. Baseline: 21.15 sec with heavy UE reliance on arms of chair (6/27), 19.34 sec with BUE support on arms of chair (7/30) Goal status: IN PROGRESS  3.  Pt will improve normal TUG to less than or equal to 35 seconds for improved functional mobility and decreased fall risk with LRAD. Baseline: 40.37 sec with SBQC (6/27), 35 sec with SBQC (7/30) Goal status: MET  4.  Pt will improve gait velocity to at least 1.0 ft/sec for improved gait efficiency and performance at mod I level with LRAD. Baseline: 0.83 ft/sec with SBQC (6/27), 1.13 ft/sec with SBQC (7/30) Goal status: MET   LONG TERM GOALS: Target  date: 04/07/2023   Pt will be independent with final HEP for improved strength, balance, transfers and gait. Baseline:  Goal status: INITIAL  2.  Pt will improve 5 x STS to less than or equal to 18 seconds to demonstrate improved functional strength and transfer efficiency with decreased BUE support.  Baseline: 21.15 sec with heavy UE reliance on arms of chair (6/27), 19.34 sec  with BUE support on arms of chair (7/30) Goal status: IREVISED  3.  Pt will improve normal TUG to less than or equal to 30 seconds for improved functional mobility and decreased fall risk with LRAD. Baseline: 40.37 sec with SBQC (6/27), 35 sec with SBQC (7/30) Goal status: INITIAL  4.  Pt will improve gait velocity to at least 1.25 ft/sec for improved gait efficiency and performance at mod I level with LRAD Baseline: 0.83 ft/sec with SBQC (6/27), 1.13 ft/sec with SBQC (7/30) Goal status: INITIAL  5.  Pt will improve her score on the MSIS-29 to </= 95 points to demonstrate decreased disability level. Baseline: 105/145 (6/27) Goal status: INITIAL    ASSESSMENT:  CLINICAL IMPRESSION: Session limited by patient's late arrival. Emphasis of skilled PT session on endurance, BLE strength, LE coordination and single leg stability. Pt reported decrease in knee pain following warmup on Scifit but did have single instance of knee hyperextension, requiring min A to unlock knee as she could not move on her own. Pt most challenged by navigating hurdles during blaze pod activity due to inability to flex L knee. Pt inquiring about what she can do to improve her balance, forgot she has a HEP to address this. Continue POC.     OBJECTIVE IMPAIRMENTS: Abnormal gait, cardiopulmonary status limiting activity, decreased activity tolerance, decreased balance, decreased endurance, decreased mobility, difficulty walking, decreased strength, increased edema, impaired sensation, impaired UE functional use, and pain.   ACTIVITY LIMITATIONS: carrying, lifting, bending, standing, squatting, stairs, transfers, and bed mobility  PARTICIPATION LIMITATIONS: meal prep, cleaning, laundry, driving, and community activity  PERSONAL FACTORS: Age, Sex, Time since onset of injury/illness/exacerbation, Transportation, and 1-2 comorbidities: RRMS, cancer, seizures  are also affecting patient's functional outcome.   REHAB  POTENTIAL: Fair time since onset of MS, previous bouts of therapy  CLINICAL DECISION MAKING: Stable/uncomplicated  EVALUATION COMPLEXITY: Moderate  PLAN:  PT FREQUENCY: 2x/week  PT DURATION: 8 weeks  PLANNED INTERVENTIONS: Therapeutic exercises, Therapeutic activity, Neuromuscular re-education, Balance training, Gait training, Patient/Family education, Self Care, Joint mobilization, Stair training, Vestibular training, Canalith repositioning, Visual/preceptual remediation/compensation, Orthotic/Fit training, DME instructions, Aquatic Therapy, Dry Needling, Electrical stimulation, Cryotherapy, Moist heat, Taping, Manual therapy, and Re-evaluation  PLAN FOR NEXT SESSION: 10th visit PN. REVIEW HEP. Update HEP for more challenging LLE strengthening and NMR, balance, endurance, gait training with rollator, (Pt's old genu recurvatum brace is in [place] desk), anterior weight shift with sit to stands, squats?  Could also try estim or bioness in future sessions (let her know when so she can wear shorts)    Jill Alexanders , PT, DPT [facility] [address] [place], [place]  [address] Phone:  [phone] Fax:  [phone]  03/16/2023, 11:52 AM

## 2023-03-17 ENCOUNTER — Ambulatory Visit: Payer: Medicare Other | Admitting: Occupational Therapy

## 2023-03-17 ENCOUNTER — Telehealth: Payer: Self-pay

## 2023-03-17 MED ORDER — HYDROCODONE-ACETAMINOPHEN 5-325 MG PO TABS: 1.0000 | ORAL_TABLET | Freq: Four times a day (QID) | ORAL | 0 refills | Status: DC | PRN

## 2023-03-17 NOTE — Addendum Note (Signed)
Addended by: Genice Rouge on: 03/17/2023 06:51 PM   Modules accepted: Orders

## 2023-03-17 NOTE — Telephone Encounter (Signed)
Please send to [facility] on [address].

## 2023-03-17 NOTE — Telephone Encounter (Signed)
Patient to call back for a refill of the Hydrocodone 5-325 MG. Because of the [facility]  shortage she will call back with another pharmacy location.   (Call back phone [phone]).

## 2023-03-17 NOTE — Telephone Encounter (Signed)
Patient has called around and states pharmacy told her there is no Hydrocodone in  it's on back order. Patient wants to know what else can be prescribed?

## 2023-03-22 ENCOUNTER — Ambulatory Visit: Payer: Medicare Other | Admitting: Occupational Therapy

## 2023-03-22 ENCOUNTER — Ambulatory Visit: Payer: Medicare Other | Admitting: Physical Therapy

## 2023-03-22 DIAGNOSIS — R41844 Frontal lobe and executive function deficit: Secondary | ICD-10-CM | POA: Diagnosis not present

## 2023-03-22 DIAGNOSIS — R208 Other disturbances of skin sensation: Secondary | ICD-10-CM

## 2023-03-22 DIAGNOSIS — R278 Other lack of coordination: Secondary | ICD-10-CM

## 2023-03-22 DIAGNOSIS — M6281 Muscle weakness (generalized): Secondary | ICD-10-CM | POA: Diagnosis not present

## 2023-03-22 DIAGNOSIS — R2689 Other abnormalities of gait and mobility: Secondary | ICD-10-CM | POA: Diagnosis not present

## 2023-03-22 DIAGNOSIS — R41842 Visuospatial deficit: Secondary | ICD-10-CM

## 2023-03-22 DIAGNOSIS — R2681 Unsteadiness on feet: Secondary | ICD-10-CM | POA: Diagnosis not present

## 2023-03-22 NOTE — Therapy (Signed)
OUTPATIENT OCCUPATIONAL THERAPY NEURO TREATMENT  Patient Name: Selena Bender MRN: [id number] DOB:[birth year], 72 y.o., female Today's Date: 03/22/2023  PCP: Bethanie Dicker, NP REFERRING PROVIDER: Bethanie Dicker, NP  END OF SESSION:  OT End of Session - 03/22/23 1154     Visit Number 6    Number of Visits 12   + evaluation   Date for OT Re-Evaluation 04/15/23    Authorization Type Medicare and [employer] VL 60    Progress Note Due on Visit 10    OT Start Time 1152    OT Stop Time 1230    OT Time Calculation (min) 38 min    Equipment Utilized During Treatment Textured Balls    Activity Tolerance Patient tolerated treatment well    Behavior During Therapy WFL for tasks assessed/performed             Past Medical History:  Diagnosis Date   Asthma    Breast cancer (HCC) 2014   Right   Chicken pox    CKD (chronic kidney disease), stage III (HCC)    COVID-19 virus infection 12/2020   Hypertension    Multiple sclerosis (HCC)    Seizures (HCC)    Past Surgical History:  Procedure Laterality Date   BREAST SURGERY     MASTECTOMY Right 2014   Patient Active Problem List   Diagnosis Date Noted   Spinal cord disease (HCC) 02/01/2023   Abnormal MRI, cervical spine (06/06/2020 & 11/13/2022) 02/01/2023   Demyelinating disease of central nervous system (HCC) 02/01/2023   Chronic radicular pain of lower extremity (S1) (Bilateral) 02/01/2023   Lower extremity burning pain 02/01/2023   Chronic low back pain (2ry area of Pain) (Bilateral) (R>L) w/ sciatica (Bilateral) 02/01/2023   Chronic hip pain (Bilateral) (L>R) 02/01/2023   Chronic upper extremity pain (intermittent) (Bilateral) 02/01/2023   Allergic rhinitis 01/14/2023   CKD (chronic kidney disease), stage III (HCC)    COVID-19 virus infection 12/2020   Myelopathy (HCC) 09/25/2020   History of right breast cancer 03/13/2020   Status post right mastectomy 03/13/2020   Abnormal gait 09/15/2018   Claustrophobia 09/15/2018    Hypertension 09/15/2018   Focal epilepsy (HCC) 09/15/2018   Multiple sclerosis (HCC) 09/15/2018   Paresthesia 09/15/2018   Chronic lower extremity pain (1ry area of Pain) (Bilateral) (R>L) 12/31/2015   Seizure (HCC) 12/31/2015   Neuropathic pain 06/02/2015   No advance directives 11/02/2012   Refusal of blood transfusion for reasons of conscience 11/02/2012   Malignant neoplasm of upper-outer quadrant of right breast in female, estrogen receptor positive (HCC) 11/01/2012    ONSET DATE: 02/04/2023  REFERRING DIAG: W09.811 (ICD-10-CM) - Left hand weakness G35 (ICD-10-CM) - Multiple sclerosis (HCC)  THERAPY DIAG:  Muscle weakness (generalized)  Other lack of coordination  Other disturbances of skin sensation  Visuospatial deficit  Rationale for Evaluation and Treatment: Rehabilitation  SUBJECTIVE:   SUBJECTIVE STATEMENT: Pt reports she can feel her left eye move to the side when she brings the item close to her face.   Pt accompanied by: self   PERTINENT HISTORY:   Pt is a 72 yr old AA female with hx of relapsing remitting MS and breast CA with mastectomy as well as seizures, chronic pain in B legs/arms and a lot of nerve pain.  PRECAUTIONS: None  WEIGHT BEARING RESTRICTIONS: No  PAIN:  Are you having pain? Yes: NPRS scale: 7/10 Pain location: legs all the way to feet and arms all the way to her hands; legs >arms  Pain description: nerve pains - pins and needles Aggravating factors: hard to say - sometimes movement is fine but sometimes it makes her feel better Relieving factors: unknown  FALLS: Has patient fallen in last 6 months? No  LIVING ENVIRONMENT: Lives with: lives with their daughter Lives in: Apartment Stairs: No Has following equipment at home: Counselling psychologist, Environmental consultant - 4 wheeled, shower chair, Grab bars, and has grab bar that needs to be installed as the suction grab bar came off the wall during a shower a few months ago - also may need elevated  BSC  PLOF: Independent with basic ADLs, Independent with household mobility with device, and Needs assistance with homemaking  PATIENT GOALS: Use my hands better.  OBJECTIVE:   HAND DOMINANCE: Right  ADLs: Overall ADLs: Pt generally MI Transfers/ambulation related to ADLs: MI with AE Eating: I Grooming: Gets hair/nails done professionally UB Dressing: Sits down to get dressed LB Dressing: Able to do it with a little bit of a struggle but "I can still do it." Toileting: MI Bathing: Showers 2x/week Tub Shower transfers: Supervision - needs grab bar replaced Equipment: Shower seat with back, Walk in shower, and Reacher  IADLs: Shopping: I go shopping sometimes (does not drive). Light housekeeping: I do my own laundry Meal Prep: Daughter does most of it. Every now and then I get in the kitchen. Community mobility: Uses cane in public and walker at home (needs a new rollator due to poor brakes etc) - may need rx for new walker Medication management: Self managed with pill bottles - only uses a pill box only when she goes away/vacation Financial management: Self managed Handwriting: 50% legible   MOBILITY STATUS: Needs Assist: uses AE - rollator at home or cane in public  POSTURE COMMENTS:  No Significant postural limitations Sitting balance: WFL  ACTIVITY TOLERANCE: Activity tolerance: Patient reports only doing 1 thing/day ie) shopping, appt or getting nails done.  Needs to take breaks ie) when doing her hair.  FUNCTIONAL OUTCOME MEASURES: Quick Dash: 34.1 % disability   UPPER EXTREMITY ROM:    Active ROM Right eval Left eval  Shoulder flexion [facility]   Shoulder abduction    Shoulder adduction    Shoulder extension    Shoulder internal rotation    Shoulder external rotation    Elbow flexion    Elbow extension  Decreased end range  Wrist flexion    Wrist extension    Wrist ulnar deviation    Wrist radial deviation    Wrist pronation    Wrist supination    (Blank  rows = not tested)  UPPER EXTREMITY MMT:     MMT Right eval Left eval  Shoulder flexion 4/5 3/5  Shoulder abduction    Shoulder adduction    Shoulder extension    Shoulder internal rotation    Shoulder external rotation    Middle trapezius    Lower trapezius    Elbow flexion    Elbow extension    Wrist flexion    Wrist extension    Wrist ulnar deviation    Wrist radial deviation    Wrist pronation    Wrist supination    (Blank rows = not tested)  HAND FUNCTION: Grip strength: Right: 33.2, 44.9, 35.0  lbs; Left: 19.8, 23.5, 21.8 lbs Evaluation Average: Right 37.7 lbs Left 21.7 lbs  COORDINATION: 9 Hole Peg test: Right: 47.93 sec; Left: 2:00:03 sec Box and Blocks:  Right 22 blocks, Left 18 blocks  SENSATION: Light  touch: Impaired  Patient reports R side intact L palmar surface a bit dull and tingly in comparison.  EDEMA: NS  MUSCLE TONE:   COGNITION: Overall cognitive status:  Patient does report some changes in memory  VISION: Subjective report: L eye wanders and doesn't focus (does not like to look in the mirror due to changes which are also noted and pointed out by other family members) Baseline vision: Bifocals Visual history: NA  VISION ASSESSMENT: Impaired Tracking/Visual pursuits: Decreased smoothness with horizontal tracking  Patient has difficulty with following activities due to following visual impairments: eye wanders  PERCEPTION: Not tested  PRAXIS: Not tested  Evaluation OBSERVATIONS: Patient is a well groomed lady who regularly has her nails manicured.  She has L pinkie finger often held in a tight hook position often with difficulty extending it actively.  Awkward positioning noted with pegboard test with L arm ie) alternating between turning wrist pronated/supinated at times.    TODAY'S TREATMENT:                                                                                                                              DATE:   -  Self-care/home management completed for duration as noted below including:  Objective measures assessed as noted in Goals section to determine progression towards goals.  Therapist reviewed goals with patient and updated patient progression.  No additional functional limitations identified.  - Therapeutic exercises completed for duration as noted below including:  OT initiated vision HEP as noted in pt instructions.  PATIENT EDUCATION: Education details: Pencil push HEP; progress towards goals.  Person educated: Patient Education method: Explanation, Demonstration, Tactile cues, Verbal cues, and Handouts Education comprehension: verbalized understanding, returned demonstration, verbal cues required, tactile cues required, and needs further education  HOME EXERCISE PROGRAM: 03/01/23: Coordination activities with images provided 03/08/23: Diplopia HEP 03/10/23: Therapy Putty Exercises - Access Code: [email or web address] 03/15/23: Wrist and Digital extension - Access Code: [email or web address] 03/22/2023: Pencil pushes and back and forth vision HEP  GOALS: Goals reviewed with patient? Yes  SHORT TERM GOALS: Target date: 03/17/2023    Patient will demonstrate BUE HEP (ROM, strength & coordination) with 25% verbal cues or less for proper execution. Baseline: not yet initiated Goal status: IN PROGRESS  2.  Pt will verbalize understanding of adapted strategies/equipment to maximize safety and independence with ADLs/IADLs. Baseline: not yet initiated Goal status: INITIAL  3.  Patient will demonstrate at least 40 lbs R grip and 25 lbs L grip strength as needed to open jars and other containers.  Baseline: Evaluation Average: Right 37.7 lbs Left 21.7 lbs 03/22/2023: Right 57.5 lbs Left 21.6 lbs (met on R)  Goal statues: IN PROGRESS  4. Patient will be aware of sensory stimulation and compensatory strategies for diminished sensation.  Baseline: Patient reports R side intact L palmar surface a bit dull and tingly in  comparison.  Goal Status: IN PROGRESS  5.  Pt will be able to  place at least 5 more blocks with each hand with completion of Box and Blocks test. Baseline: Box and Blocks:  Right 22 blocks, Left 18 blocks 03/22/2023: Right 22 blocks, Left 18 blocks Goal status: IN PROGRESS  6.  Pt will demonstrate use of binder for information organization of HEPs, MS resources and medical records. Baseline: not yet initiated - daughter present at eval for recommendation Goal status: IN PROGRESS  LONG TERM GOALS: Target date: 04/08/2023    Pt will verbalize understanding (or locate information in info binder) re: ways to prevent future MS related complications and MS community resources. Baseline: not yet initiated Goal status: INITIAL  2.  Pt will verbalize understanding of ways to keep thinking skills sharp and ways to compensate for STM changes in the future. Baseline: not yet initiated Goal status: INITIAL  3.  Pt will demonstrate improved fine motor coordination for ADLs as evidenced by decreasing 9 hole peg test score for BUE by 10-15 secs. Baseline: 9 Hole Peg test: Right: 47.93 sec; Left: 2:00:03 sec 03/22/2023: Right: 46.26 sec; Left: 141 sec Goal status: IN PROGRESS  4.  Pt will demonstrate improved positioning of L pinkie finger through proper day and/or nighttime splinting as deemed effective, comfortable and appropriate. Baseline: poor active extension of PIP joint Goal status: IN PROGRESS  5.  Patient will demonstrate at least 16% improvement with quick Dash score (reporting 18% disability or less) indicating improved functional use of affected extremity. Baseline: 34% disability Goal status: INITIAL      ASSESSMENT:  CLINICAL IMPRESSION:  Pt demonstrating little progress towards goals. Would benefit from HEP review and reinforcement of exercises to help with efforts to progress towards goals.   PERFORMANCE DEFICITS: in functional skills including ADLs, coordination, dexterity,  sensation, edema, ROM, strength, pain, flexibility, Fine motor control, Gross motor control, mobility, balance, endurance, decreased knowledge of use of DME, and UE functional use, cognitive skills including attention and memory, and psychosocial skills including coping strategies and routines and behaviors.   IMPAIRMENTS: are limiting patient from ADLs, rest and sleep, leisure, and social participation.   CO-MORBIDITIES: may have co-morbidities  that affects occupational performance. Patient will benefit from skilled OT to address above impairments and improve overall function.  REHAB POTENTIAL: Good  PLAN:  OT FREQUENCY: 1-2x/week  OT DURATION: 6 weeks  PLANNED INTERVENTIONS: self care/ADL training, therapeutic exercise, therapeutic activity, neuromuscular re-education, balance training, functional mobility training, splinting, patient/family education, cognitive remediation/compensation, visual/perceptual remediation/compensation, energy conservation, coping strategies training, and DME and/or AE instructions  RECOMMENDED OTHER SERVICES: May need prescriptions for [facility] and new rollator.  Also may need eye exam.  CONSULTED AND AGREED WITH PLAN OF CARE: Patient and family member/caregiver  PLAN FOR NEXT SESSIONS:  MS Education BINDER ie) resources, AE, energy conservation, exercises etc from HEPs already  Review pencil push HEP   Sent request for BSC/rollator replacement. Any updates?  Review HEP for UE ROM, coordination, strength (putty intro'd 8/1)    sensory stimulation/compensatory strategies (will need handouts)  Delana Meyer, OT 03/22/2023, 11:55 AM

## 2023-03-22 NOTE — Progress Notes (Unsigned)
PROVIDER NOTE: Information contained herein reflects review and annotations entered in association with encounter. Interpretation of such information and data should be left to medically-trained personnel. Information provided to patient can be located elsewhere in the medical record under "Patient Instructions". Document created using STT-dictation technology, any transcriptional errors that may result from process are unintentional.    Patient: Selena Bender  Service Category: E/M  Provider: Oswaldo Done, MD  DOB: [birth year]  DOS: 03/23/2023  Referring Provider: Bethanie Dicker, NP  MRN: [id number]  Specialty: Interventional Pain Management  PCP: Bethanie Dicker, NP  Type: Established Patient  Setting: Ambulatory outpatient    Location: Office  Delivery: Face-to-face     Primary Reason(s) for Visit: Encounter for evaluation before starting new chronic pain management plan of care (Level of risk: moderate) CC: No chief complaint on file.  HPI  Selena Bender is a 72 y.o. year old, female patient, who comes today for a follow-up evaluation to review the test results and decide on a treatment plan. She has Abnormal gait; Malignant neoplasm of upper-outer quadrant of right breast in female, estrogen receptor positive (HCC); Claustrophobia; Hypertension; Focal epilepsy (HCC); Multiple sclerosis (HCC); Neuropathic pain; No advance directives; Chronic lower extremity pain (1ry area of Pain) (Bilateral) (R>L); Paresthesia; Refusal of blood transfusion for reasons of conscience; Seizure (HCC); History of right breast cancer; Status post right mastectomy; COVID-19 virus infection; CKD (chronic kidney disease), stage III (HCC); Allergic rhinitis; Myelopathy (HCC); Spinal cord disease (HCC); Abnormal MRI, cervical spine (06/06/2020 & 11/13/2022); Demyelinating disease of central nervous system (HCC); Chronic radicular pain of lower extremity (S1) (Bilateral); Lower extremity burning pain; Chronic low back  pain (2ry area of Pain) (Bilateral) (R>L) w/ sciatica (Bilateral); Chronic hip pain (Bilateral) (L>R); and Chronic upper extremity pain (intermittent) (Bilateral) on their problem list. Her primarily concern today is the No chief complaint on file.  Pain Assessment: Location:     Radiating:   Onset:   Duration:   Quality:   Severity:  /10 (subjective, self-reported pain score)  Effect on ADL:   Timing:   Modifying factors:   BP:    HR:    Selena Bender comes in today for a follow-up visit after her initial evaluation on 03/02/2023. Today we went over the results of her tests. These were explained in "Layman's terms". During today's appointment we went over my diagnostic impression, as well as the proposed treatment plan.  ***  Patient presented with interventional treatment options. Selena Bender was informed that I will not be providing medication management. Pharmacotherapy evaluation including recommendations may be offered, if specifically requested.   Controlled Substance Pharmacotherapy Assessment REMS (Risk Evaluation and Mitigation Strategy)  Opioid Analgesic: No chronic opioid analgesics therapy prescribed by our practice. Hydrocodone/APAP 5/325, 1-2 tabs p.o. daily (# 30) (last filled on 01/26/2023) (7.5 MME); tramadol 50 mg tablet, 1 tab p.o. 4 times daily (#120) (last filled on 01/25/2023) (40.0 MME) MME/day: 47.5 mg/day  Pill Count: None expected due to no prior prescriptions written by our practice. No notes on file Pharmacokinetics: Liberation and absorption (onset of action): WNL Distribution (time to peak effect): WNL Metabolism and excretion (duration of action): WNL         Pharmacodynamics: Desired effects: Analgesia: Selena Bender reports >50% benefit. Functional ability: Patient reports that medication allows her to accomplish basic ADLs Clinically meaningful improvement in function (CMIF): Sustained CMIF goals met Perceived effectiveness: Described  as relatively effective, allowing for increase in activities of daily  living (ADL) Undesirable effects: Side-effects or Adverse reactions: None reported Monitoring: [place] PMP: PDMP reviewed during this encounter. Online review of the past 12-month period previously conducted. Not applicable at this point since we have not taken over the patient's medication management yet. List of other Serum/Urine Drug Screening Test(s):  No results found for: "AMPHSCRSER", "BARBSCRSER", "BENZOSCRSER", "COCAINSCRSER", "COCAINSCRNUR", "PCPSCRSER", "THCSCRSER", "THCU", "CANNABQUANT", "OPIATESCRSER", "OXYSCRSER", "PROPOXSCRSER", "ETH", "CBDTHCR", "D8THCCBX", "D9THCCBX" List of all UDS test(s) done:  Lab Results  Component Value Date   SUMMARY Note 02/01/2023   SUMMARY Note 11/30/2019   Last UDS on record: Summary  Date Value Ref Range Status  02/01/2023 Note  Final    Comment:    ==================================================================== Compliance Drug Analysis, Ur ==================================================================== Test                             Result       Flag       Units  Drug Present   Carboxy-THC                    6                       ng/mg creat    Carboxy-THC is a metabolite of tetrahydrocannabinol (THC). Source of    THC is most commonly herbal marijuana or marijuana-based products,    but THC is also present in a scheduled prescription medication.    Trace amounts of THC can be present in hemp and cannabidiol (CBD)    products. This test is not intended to distinguish between delta-9-    tetrahydrocannabinol, the predominant form of THC in most herbal or    marijuana-based products, and delta-8-tetrahydrocannabinol.    Hydrocodone                    550                     ng/mg creat   Hydromorphone                  124                     ng/mg creat   Dihydrocodeine                 104                     ng/mg creat   Norhydrocodone                 803                      ng/mg creat    Sources of hydrocodone include scheduled prescription medications.    Hydromorphone, dihydrocodeine and norhydrocodone are expected    metabolites of hydrocodone. Hydromorphone and dihydrocodeine are    also available as scheduled prescription medications.    Tramadol                       >3401                   ng/mg creat   O-Desmethyltramadol            >3401                   ng/mg creat  N-Desmethyltramadol            927                     ng/mg creat    Source of tramadol is a prescription medication. O-desmethyltramadol    and N-desmethyltramadol are expected metabolites of tramadol.    Lamotrigine                    PRESENT   Acetaminophen                  PRESENT ==================================================================== Test                      Result    Flag   Units      Ref Range   Creatinine              147              mg/dL      >=91 ==================================================================== Declared Medications:  Medication list was not provided. ==================================================================== For clinical consultation, please call [phone]. ====================================================================    UDS interpretation: No unexpected findings.          Medication Assessment Form: Not applicable. No opioids. Treatment compliance: Not applicable Risk Assessment Profile: Aberrant behavior: See initial evaluations. None observed or detected today Comorbid factors increasing risk of overdose: See initial evaluation. No additional risks detected today Opioid risk tool (ORT):     02/01/2023   11:49 AM  Opioid Risk   Alcohol 0  Illegal Drugs 2  Rx Drugs 0  Alcohol 0  Illegal Drugs 0  Rx Drugs 0  Age between 16-45 years  0  History of Preadolescent Sexual Abuse 0  Psychological Disease 0  Depression 0  Opioid Risk Tool Scoring 2  Opioid Risk Interpretation Low Risk     ORT Scoring interpretation table:  Score <3 = Low Risk for SUD  Score between 4-7 = Moderate Risk for SUD  Score >8 = High Risk for Opioid Abuse   Risk of substance use disorder (SUD): Low  Risk Mitigation Strategies:  Patient opioid safety counseling: No controlled substances prescribed. Patient-Prescriber Agreement (PPA): No agreement signed.  Controlled substance notification to other providers: None required. No opioid therapy.  Pharmacologic Plan: Non-opioid analgesic therapy offered. Interventional alternatives discussed.             Laboratory Chemistry Profile   Renal Lab Results  Component Value Date   BUN 21 09/27/2022   CREATININE 1.52 (H) 09/27/2022   BCR 13 09/03/2020   GFR 34.58 (L) 09/27/2022   GFRAA 31 (L) 09/03/2020   GFRNONAA 38 (L) 04/06/2021   SPECGRAV 1.019 05/31/2019   PHUR 5.5 05/31/2019   PROTEINUR Trace 05/31/2019     Electrolytes Lab Results  Component Value Date   NA 138 09/27/2022   K 4.8 09/27/2022   CL 105 09/27/2022   CALCIUM 10.0 09/27/2022   MG 2.1 02/01/2023     Hepatic Lab Results  Component Value Date   AST 19 09/27/2022   ALT 9 09/27/2022   ALBUMIN 4.4 09/27/2022   ALKPHOS 51 09/27/2022     ID Lab Results  Component Value Date   SARSCOV2NAA POSITIVE (A) 12/18/2020     Bone Lab Results  Component Value Date   VD25OH 93.88 09/27/2022     Endocrine Lab Results  Component Value Date   GLUCOSE 104 (H) 09/27/2022   GLUCOSEU Negative 05/31/2019  TSH 1.250 09/03/2020     Neuropathy Lab Results  Component Value Date   VITAMINB12 755 09/24/2021     CNS No results found for: "COLORCSF", "APPEARCSF", "RBCCOUNTCSF", "WBCCSF", "POLYSCSF", "LYMPHSCSF", "EOSCSF", "PROTEINCSF", "GLUCCSF", "JCVIRUS", "CSFOLI", "IGGCSF", "LABACHR", "ACETBL"   Inflammation (CRP: Acute  ESR: Chronic) Lab Results  Component Value Date   CRP <1 02/01/2023   ESRSEDRATE 31 02/01/2023     Rheumatology No results found for: "RF",  "ANA", "LABURIC", "URICUR", "LYMEIGGIGMAB", "LYMEABIGMQN", "HLAB27"   Coagulation Lab Results  Component Value Date   PLT 225.0 09/27/2022     Cardiovascular Lab Results  Component Value Date   HGB 13.4 09/27/2022   HCT 41.8 09/27/2022     Screening Lab Results  Component Value Date   SARSCOV2NAA POSITIVE (A) 12/18/2020     Cancer No results found for: "CEA", "CA125", "LABCA2"   Allergens No results found for: "ALMOND", "APPLE", "ASPARAGUS", "AVOCADO", "BANANA", "BARLEY", "BASIL", "BAYLEAF", "GREENBEAN", "LIMABEAN", "WHITEBEAN", "BEEFIGE", "REDBEET", "BLUEBERRY", "BROCCOLI", "CABBAGE", "MELON", "CARROT", "CASEIN", "CASHEWNUT", "CAULIFLOWER", "CELERY"     Note: Lab results reviewed.  Recent Diagnostic Imaging Review  Cervical Imaging: Cervical MR wo contrast: Results for orders placed during the hospital encounter of 06/06/20  MR CERVICAL SPINE WO CONTRAST  Narrative CLINICAL DATA:  Multiple sclerosis.  Spinal stenosis.  EXAM: MRI CERVICAL SPINE WITHOUT CONTRAST  TECHNIQUE: Multiplanar, multisequence MR imaging of the cervical spine was performed. No intravenous contrast was administered.  COMPARISON:  05/11/2019  FINDINGS: Alignment: Straightening of the normal cervical lordosis, similar. No substantial subluxation.  Vertebrae: No bone marrow edema to suggest acute fracture, discitis/osteomyelitis, or suspicious bone lesion. Vertebral body heights are maintained.  Cord: Similar multifocal patchy areas of abnormal cord signal. This includes abnormal cord signal at the cervicomedullary junction (series 5, image 9) and the left eccentric cord at C4 (series 5, image 10). Likely additional small T2/stir hyperintensity within the dorsal cord at C5 (series 7, image 19 and series 5, image 9), which is likely present on the prior but not as well visualized given motion on that study. No convincing new cord signal abnormality. The absence of contrast precludes  evaluation for enhancing lesions.  Posterior Fossa, vertebral arteries, paraspinal tissues: Brain findings reported separately today. Maintained vertebral artery flow voids.  Disc levels:  C2-C3: Mild facet hypertrophy without significant canal or foraminal stenosis.  C3-C4: No significant disc protrusion, foraminal stenosis, or canal stenosis.  C4-C5: Similar bulky/lobulated posterior disc osteophyte complex, eccentric to the left. Similar canal stenosis with mass effect on the left a centric cord. Similar bilateral uncovertebral hypertrophy with severe bilateral foraminal stenosis.  C5-C6: Similar bulky and lobulated posterior disc osteophyte complex, eccentric to the left. Similar canal stenosis with left hemi cord mass effect. Similar bilateral uncovertebral hypertrophy with severe bilateral foraminal stenosis.  C6-C7: Similar posterior disc osteophyte complex. Similar mild canal stenosis without cord mass effect. Similar bilateral uncovertebral hypertrophy with moderate to severe bilateral foraminal stenosis.  C7-T1: Small posterior disc bulge. Bilateral facet uncovertebral hypertrophy. Similar moderate to severe right and mild-to-moderate left foraminal stenosis.  IMPRESSION: 1. Multifocal short-segment T2/STIR hyperintensities within the cord, which are detailed above and compatible with reported history of multiple sclerosis. No convincing new cord lesions in the cervical spine. The absence of contrast precludes evaluation for enhancing lesions. 2. Similar multilevel degenerative change with similar mass effect on the left eccentric cord at C4-C5 and C5-C6 and severe bilateral foraminal stenosis at these levels. Additional degenerative changes are detailed above.   Electronically Signed By:  Feliberto Harts MD On: 06/06/2020 17:42  Cervical MR wo contrast: No valid procedures specified. Cervical CT wo contrast: No results found for this or any previous  visit.  Cervical DG Bending/F/E views: No results found for this or any previous visit.   Shoulder Imaging: Shoulder-R MR wo contrast: No results found for this or any previous visit.  Shoulder-L MR wo contrast: No results found for this or any previous visit.  Shoulder-R DG: No results found for this or any previous visit.  Shoulder-L DG: No results found for this or any previous visit.   Thoracic Imaging: Thoracic MR wo contrast: No results found for this or any previous visit.  Thoracic MR wo contrast: No valid procedures specified. Thoracic CT wo contrast: No results found for this or any previous visit.  Thoracic DG 4 views: No results found for this or any previous visit.  Thoracic DG w/swimmers view: No results found for this or any previous visit.   Lumbosacral Imaging: Lumbar MR wo contrast: No results found for this or any previous visit.  Lumbar MR wo contrast: No valid procedures specified. Lumbar CT wo contrast: No results found for this or any previous visit.  Lumbar DG Bending views: Results for orders placed during the hospital encounter of 02/01/23  DG Lumbar Spine Complete W/Bend  Narrative CLINICAL DATA:  Low back pain  EXAM: LUMBAR SPINE - COMPLETE WITH BENDING VIEWS  COMPARISON:  None Available.  FINDINGS: Five non rib-bearing lumbar type vertebra. Lumbar alignment within normal limits. Vertebral body heights are maintained. No suspicious change in alignment with flexion or extension. Mild disc space narrowing L2-L3 and L4-L5. Moderate facet degenerative changes worst at L4-L5 and L5-S1.  IMPRESSION: Multilevel degenerative changes as described above.   Electronically Signed By: Jasmine Pang M.D. On: 02/06/2023 20:47         Sacroiliac Joint Imaging: Sacroiliac Joint DG: No results found for this or any previous visit.   Hip Imaging: Hip-R MR wo contrast: No results found for this or any previous visit.  Hip-L MR wo contrast: No  results found for this or any previous visit.  Hip-R CT wo contrast: No results found for this or any previous visit.  Hip-L CT wo contrast: No results found for this or any previous visit.  Hip-R DG 2-3 views: No results found for this or any previous visit.  Hip-L DG 2-3 views: No results found for this or any previous visit.  Hip-B DG Bilateral: No results found for this or any previous visit.  Hip-B DG Bilateral (5V): Results for orders placed during the hospital encounter of 02/01/23  DG HIPS BILAT WITH PELVIS MIN 5 VIEWS  Narrative CLINICAL DATA:  Chronic hip pain  EXAM: DG HIP (WITH OR WITHOUT PELVIS) 5+V BILAT  COMPARISON:  None Available.  FINDINGS: SI joints are patent. Pubic symphysis and rami appear intact. Mild bilateral hip degenerative changes with minimal spurring and joint space narrowing  IMPRESSION: Mild bilateral hip degenerative changes.   Electronically Signed By: Jasmine Pang M.D. On: 02/06/2023 20:48   Knee Imaging: Knee-R MR wo contrast: No results found for this or any previous visit.  Knee-L MR wo contrast: No results found for this or any previous visit.  Knee-R CT wo contrast: No results found for this or any previous visit.  Knee-L CT wo contrast: No results found for this or any previous visit.  Knee-R DG 4 views: No results found for this or any previous visit.  Knee-L  DG 4 views: No results found for this or any previous visit.   Ankle Imaging: Ankle-R DG Complete: No results found for this or any previous visit.  Ankle-L DG Complete: No results found for this or any previous visit.   Foot Imaging: Foot-R DG Complete: No results found for this or any previous visit.  Foot-L DG Complete: No results found for this or any previous visit.   Elbow Imaging: Elbow-R DG Complete: No results found for this or any previous visit.  Elbow-L DG Complete: No results found for this or any previous visit.   Wrist  Imaging: Wrist-R DG Complete: No results found for this or any previous visit.  Wrist-L DG Complete: No results found for this or any previous visit.   Hand Imaging: Hand-R DG Complete: No results found for this or any previous visit.  Hand-L DG Complete: No results found for this or any previous visit.   Complexity Note: Imaging results reviewed.                         Meds   Current Outpatient Medications:    albuterol (VENTOLIN HFA) 108 (90 Base) MCG/ACT inhaler, INHALE 1 TO 2 PUFFS EVERY 4 HOURS AS NEEDED, Disp: , Rfl:    ascorbic acid (VITAMIN C) 500 MG/5ML syrup, Take by mouth daily., Disp: , Rfl:    cetirizine (ZYRTEC) 10 MG tablet, Take 1 tablet (10 mg total) by mouth daily., Disp: 30 tablet, Rfl: 11   cholecalciferol (VITAMIN D) 25 MCG (1000 UNIT) tablet, Take 4 tablets (4,000 Units total) by mouth daily., Disp: 90 tablet, Rfl: 3   fluticasone (FLONASE) 50 MCG/ACT nasal spray, Place 1 spray into both nostrils at bedtime., Disp: 16 g, Rfl: 5   HYDROcodone-acetaminophen (NORCO) 5-325 MG tablet, Take 1 tablet by mouth every 6 (six) hours as needed for moderate pain., Disp: 120 tablet, Rfl: 0   lamoTRIgine (LAMICTAL) 25 MG tablet, 2 tablets twice daily., Disp: 360 tablet, Rfl: 2   losartan (COZAAR) 50 MG tablet, Take 50 mg by mouth daily., Disp: , Rfl:    traMADol (ULTRAM) 50 MG tablet, Take 1 tablet (50 mg total) by mouth every 6 (six) hours as needed. (Patient not taking: Reported on 02/01/2023), Disp: 120 tablet, Rfl: 5  ROS  Constitutional: Denies any fever or chills Gastrointestinal: No reported hemesis, hematochezia, vomiting, or acute GI distress Musculoskeletal: Denies any acute onset joint swelling, redness, loss of ROM, or weakness Neurological: No reported episodes of acute onset apraxia, aphasia, dysarthria, agnosia, amnesia, paralysis, loss of coordination, or loss of consciousness  Allergies  Ms. Collymore Bender has No Known Allergies.  PFSH  Drug: Selena Bender  reports that she does not currently use drugs after having used the following drugs: Marijuana. Alcohol:  reports current alcohol use. Tobacco:  reports that she has quit smoking. Her smoking use included cigarettes. She has never used smokeless tobacco. Medical:  has a past medical history of Asthma, Breast cancer (HCC) (2014), Chicken pox, CKD (chronic kidney disease), stage III (HCC), COVID-19 virus infection (12/2020), Hypertension, Multiple sclerosis (HCC), and Seizures (HCC). Surgical: Selena Bender  has a past surgical history that includes Breast surgery and Mastectomy (Right, 2014). Family: family history includes Sickle cell anemia in her father.  Constitutional Exam  General appearance: Well nourished, well developed, and well hydrated. In no apparent acute distress There were no vitals filed for this visit. BMI Assessment: Estimated body mass index is 23.63  kg/m as calculated from the following:   Height as of 02/01/23: 5\' 5"  (1.651 m).   Weight as of 02/01/23: 142 lb (64.4 kg).  BMI interpretation table: BMI level Category Range association with higher incidence of chronic pain  <18 kg/m2 Underweight   18.5-24.9 kg/m2 Ideal body weight   25-29.9 kg/m2 Overweight Increased incidence by 20%  30-34.9 kg/m2 Obese (Class I) Increased incidence by 68%  35-39.9 kg/m2 Severe obesity (Class II) Increased incidence by 136%  >40 kg/m2 Extreme obesity (Class III) Increased incidence by 254%   Patient's current BMI Ideal Body weight  There is no height or weight on file to calculate BMI. Patient weight not recorded   BMI Readings from Last 4 Encounters:  02/01/23 23.63 kg/m  01/26/23 23.63 kg/m  01/14/23 23.70 kg/m  09/28/22 26.47 kg/m   Wt Readings from Last 4 Encounters:  02/01/23 142 lb (64.4 kg)  01/26/23 142 lb (64.4 kg)  01/14/23 142 lb 6.4 oz (64.6 kg)  09/28/22 154 lb 3.2 oz (69.9 kg)    Psych/Mental status: Alert, oriented x 3 (person, place, & time)        Eyes: PERLA Respiratory: No evidence of acute respiratory distress  Assessment & Plan  Primary Diagnosis & Pertinent Problem List: {There were no encounter diagnoses. (Refresh or delete this SmartLink)}  Visit Diagnosis: No diagnosis found. Problems updated and reviewed during this visit: No problems updated.  Plan of Care  Pharmacotherapy (Medications Ordered): No orders of the defined types were placed in this encounter.  Procedure Orders    No procedure(s) ordered today   Lab Orders  No laboratory test(s) ordered today   Imaging Orders  No imaging studies ordered today   Referral Orders  No referral(s) requested today    Pharmacological management:  Opioid Analgesics: I will not be prescribing any opioids at this time Membrane stabilizer: I will not be prescribing any at this time Muscle relaxant: I will not be prescribing any at this time NSAID: I will not be prescribing any at this time Other analgesic(s): I will not be prescribing any at this time      Interventional Therapies  Risk Factors  Considerations:  MS  CKD  Seizure disorder (focal epilepsy)  HTN  claustrophobia  Hx Right Breast CA (Hx Mastectomy)     Planned  Pending:   Diagnostic right L4-5 vs. L5-S1 LESI #1    Under consideration:   Diagnostic right L4-5 vs. L5-S1 LESI #1    Completed:   None at this time   Therapeutic  Palliative (PRN) options:   None established   Completed by other providers:   None reported         Provider-requested follow-up: No follow-ups on file. Recent Visits Date Type Provider Dept  02/01/23 Office Visit Delano Metz, MD [facility]  Showing recent visits within past 90 days and meeting all other requirements Future Appointments Date Type Provider Dept  03/23/23 Appointment Delano Metz, MD [facility]  Showing future appointments within next 90 days and meeting all other requirements   Primary Care Physician:  Bethanie Dicker, NP  Duration of encounter: *** minutes.  Total time on encounter, as per AMA guidelines included both the face-to-face and non-face-to-face time personally spent by the physician and/or other qualified health care professional(s) on the day of the encounter (includes time in activities that require the physician or other qualified health care professional and does not include time in activities normally performed by clinical staff).  Physician's time may include the following activities when performed: Preparing to see the patient (e.g., pre-charting review of records, searching for previously ordered imaging, lab work, and nerve conduction tests) Review of prior analgesic pharmacotherapies. Reviewing PMP Interpreting ordered tests (e.g., lab work, imaging, nerve conduction tests) Performing post-procedure evaluations, including interpretation of diagnostic procedures Obtaining and/or reviewing separately obtained history Performing a medically appropriate examination and/or evaluation Counseling and educating the patient/family/caregiver Ordering medications, tests, or procedures Referring and communicating with other health care professionals (when not separately reported) Documenting clinical information in the electronic or other health record Independently interpreting results (not separately reported) and communicating results to the patient/ family/caregiver Care coordination (not separately reported)  Note by: Oswaldo Done, MD (TTS technology used. I apologize for any typographical errors that were not detected and corrected.) Date: 03/23/2023; Time: 7:52 AM

## 2023-03-22 NOTE — Patient Instructions (Signed)
Pencil Pushes Start by holding one pen in front of you at arm's length. Bring it towards your nose at a slow, steady pace, keeping the tip of the pen focused and single. When the pen is very close to your nose it may become blurry - this is acceptable as long as it is still single and you cannot see two pens. When the pen becomes doubled, try to make your eyes work hard so that you see it single again. If you can get the pen to touch your nose and still keep it single, hold this position for two seconds then relax. If you cannot keep the pen single, move it back to arm's length and start again. Repeat this for one minute (or 20 cycles) then relax the eyes by looking in the distance for one minute. Back and Forths Using two pens, cards, etc., hold both at arm's length  Move your gaze back and forth as quickly as you can  Repeat for one minute (or 20 cycles), then relax the eyes by looking in the distance for one minute. Do these one minute cycles at least three different times during the day.

## 2023-03-23 ENCOUNTER — Ambulatory Visit (HOSPITAL_BASED_OUTPATIENT_CLINIC_OR_DEPARTMENT_OTHER): Payer: Medicare Other | Admitting: Pain Medicine

## 2023-03-23 DIAGNOSIS — Z91199 Patient's noncompliance with other medical treatment and regimen due to unspecified reason: Secondary | ICD-10-CM

## 2023-03-23 DIAGNOSIS — G8929 Other chronic pain: Secondary | ICD-10-CM

## 2023-03-24 ENCOUNTER — Ambulatory Visit: Payer: Medicare Other | Admitting: Occupational Therapy

## 2023-03-24 ENCOUNTER — Ambulatory Visit: Payer: Medicare Other

## 2023-03-24 DIAGNOSIS — R278 Other lack of coordination: Secondary | ICD-10-CM

## 2023-03-24 DIAGNOSIS — R2681 Unsteadiness on feet: Secondary | ICD-10-CM | POA: Diagnosis not present

## 2023-03-24 DIAGNOSIS — R2689 Other abnormalities of gait and mobility: Secondary | ICD-10-CM

## 2023-03-24 DIAGNOSIS — M6281 Muscle weakness (generalized): Secondary | ICD-10-CM

## 2023-03-24 DIAGNOSIS — R208 Other disturbances of skin sensation: Secondary | ICD-10-CM

## 2023-03-24 DIAGNOSIS — R41842 Visuospatial deficit: Secondary | ICD-10-CM

## 2023-03-24 DIAGNOSIS — R41844 Frontal lobe and executive function deficit: Secondary | ICD-10-CM | POA: Diagnosis not present

## 2023-03-24 NOTE — Patient Instructions (Signed)
  1    Energy Conservation  What is Energy Conservation?  After being in the hospital, it is normal to feel tired and weak. You may also feel short of breath and have less energy to do the activities you are used to doing at home. Learning how to conserve your energy helps you build up your strength to take part in your daily activities and other things you enjoy doing.  When you learn to conserve energy, you also reduce strain on your heart, fatigue, shortness of breath and stress related pain.  Learning to conserve your energy is all about finding a good balance between work, rest and leisure in order to decrease the amount of energy demand on your body.   Remember and Practice the 4 Ps  1. Prioritize  Decide what needs to be done today and what can be done at a later date or time. For example, going to a doctor's appointment would take priority over dusting the living room.  When you have more than one thing to do, begin with the most important to make sure it gets done.   2. Plan  Plan your activities first to avoid extra trips. Gather the supplies and  equipment you need before doing the job. For example, get your  garden supplies and tools ready before you start to plant flowers.  Plan to alternate heavy and light tasks.  Plan activities throughout the week to avoid doing too many activities in one day. Put a schedule on the refrigerator to remind you and others who is doing what.  Plan to get a good rest each night.  Use family and friends or pay for help to complete tasks you may struggle with or that require too much energy.   3. Pace  Maintain a slow and steady pace. Never rush.  2   3. Pace (continued)  Rest often. Rest before you feel tired.  Avoid holding your breath. Practice breathing slow and steady.  Use pursed lip breathing. Breathe in through your nose for a count of 2 and out from your mouth for a count of 4. This is like blowing out a candle on a cake.  Remember  that you may have to ask for help to do some tasks and that is okay.  Listen to your body and know your limits.   4. Position  Too much bending and reaching can cause fatigue and shortness of breath. Use a reacher, sock aid, long handled shoe horn and/or  elastic shoelaces. Avoid bending and reaching too much.  Always maintain a nice upright posture when sitting and  standing. This helps you get more oxygen into your lungs and  around your body to work better.  Sit when you can. Sitting supports your body so you can focus  on your breathing and activities while conserving your energy.  Sitting reduces energy use by 25%.   Energy Conservation Tips  Dressing and Hygiene  Sit when you can.  Organize and lay out clothing the night before.  Begin dressing your lower half first as this uses more energy.  Avoid bending and reaching. Instead, use a reacher, sock aid  or long handled shoe horn or lift your legs up onto the bed or chair.  Dry off with terry cloth robe. You use less energy than drying off with a towel.  If you have a weaker limb or limbs, it is easier to dress the weaker limb first. It is easier to undress   strong limb first.  Wear clothes that are easy to put on and take off. For example, use clothes and shoes with velcro instead of small buttons, clasps or laces.  3    Avoid using scented products such as hair products and lotions. These can irritate your lungs and cause shortness of breath for you and others around you. Many people are allergic to scents. These types of products are not allowed in the hospital.  Be cautious when bathing. Use warm, not hot water. This helps eliminate shortness of breath from a buildup of steam and condensation.  Use the bathroom equipment suggested by your Occupational Therapist. For example using a bath bench, bath stool, grab bars or a raised toilet seat can make bathing and toileting easier and safer.   Shopping  Bring a prepared list  of things you need to buy.  Organize your shopping list by aisle or section of the  store.  Transport items in a buggy or shopping cart rather than  carrying them in a basket.  Load and carry grocery bags that are only half full or shop with someone who can help pack and carry bags.  Avoid going out during rush hour when stores and streets are crowded.  Consider using a delivery service.   Housework  Divide activities and do them throughout the week. Balance light with heavy tasks.  Make one side of the bed at a time. Sit to change pillow cases and unfold linen.  Avoid spray cleaners that may irritate your lungs.  Clean the bathtub by sitting or kneeling.  Clean one whole room at a time instead of going back and forth between rooms to do each job.  Consider asking for help from family members or  hiring a cleaning service or housekeeper.  After washing dishes, allow them to air dry.  Have work in front of you rather than at your side.  Slide rather than lift objects.  Use long handled dustpans and cleaning sponges to decrease the need for bending.  4    Make a weekly plan for major jobs such as laundry, cleaning and  changing sheets on beds. Do one job each day.  Keep a trash can in every room to avoid too much walking.  Buy more than one of each item you use around the house.  For example, keep sink cleaner in the bathroom and kitchen.  Keep a vacuum on each level of your home.   Cooking  Cook and bake in steps to reduce energy use.  Gather all ingredients and utensils before starting.  Plan ahead with meal preparation.  Make large meals and freeze in servings for later use.  Use lightweight cookware and dishes to conserve energy.  Use paper plates and cups to eliminate dishwashing.  Use electric appliances such as can openers, blenders, food processors and dishwasher to conserve energy.  Consider buying easy to prepare or frozen meals, or using a meal delivery service.    Key Points:  Prioritize activities of the day. Do heavier tasks when you have more energy.  Plan your days' and weeks' activities. Set up your work area so you do not have to move around a lot looking for items to complete the task. Plan rest times.  Pace yourself. Do not try to complete the whole task in one session. Break it into smaller, easy to do steps. A good guide to follow is to take 10 minutes each hour to rest. Do not rush.  Position and Posture are important. Sit to work when you can to use 25% less energy. Sit and stand as upright as you can. Practice deep breathing exercises while you work to maintain your breathing rate and stay relaxed.  Use assistive devices when recommended to save energy and make it more comfortable and easy taking care of yourself.   Remember.  The most important energy conservation tip is to listen to your body.  Stop and rest BEFORE you get tired. Plan rest times. Rest often.

## 2023-03-24 NOTE — Therapy (Signed)
OUTPATIENT PHYSICAL THERAPY NEURO TREATMENT- 10TH VISIT PROGRESS NOTE   Patient Name: Selena Bender MRN: [id number] DOB:06-23-1951, 72 y.o., female Today's Date: 03/24/2023   PCP: Bethanie Dicker, NP REFERRING PROVIDER: Bethanie Dicker, NP  Physical Therapy Progress Note   Dates of Reporting Period: 02/03/23 - 03/24/23  See Note below for Objective Data and Assessment of Progress/Goals.   END OF SESSION:  PT End of Session - 03/24/23 1120     Visit Number 10    Number of Visits 17   with eval   Date for PT Re-Evaluation 04/28/23    Authorization Type Medicare    Progress Note Due on Visit 10    PT Start Time 1118   Pt arrived late   Equipment Utilized During Treatment Gait belt    Activity Tolerance Patient tolerated treatment well    Behavior During Therapy WFL for tasks assessed/performed                   Past Medical History:  Diagnosis Date   Asthma    Breast cancer (HCC) 2014   Right   Chicken pox    CKD (chronic kidney disease), stage III (HCC)    COVID-19 virus infection 12/2020   Hypertension    Multiple sclerosis (HCC)    Seizures (HCC)    Past Surgical History:  Procedure Laterality Date   BREAST SURGERY     MASTECTOMY Right 2014   Patient Active Problem List   Diagnosis Date Noted   Spinal cord disease (HCC) 02/01/2023   Abnormal MRI, cervical spine (06/06/2020 & 11/13/2022) 02/01/2023   Demyelinating disease of central nervous system (HCC) 02/01/2023   Chronic radicular pain of lower extremity (S1) (Bilateral) 02/01/2023   Lower extremity burning pain 02/01/2023   Chronic low back pain (2ry area of Pain) (Bilateral) (R>L) w/ sciatica (Bilateral) 02/01/2023   Chronic hip pain (Bilateral) (L>R) 02/01/2023   Chronic upper extremity pain (intermittent) (Bilateral) 02/01/2023   Allergic rhinitis 01/14/2023   CKD (chronic kidney disease), stage III (HCC)    COVID-19 virus infection 12/2020   Myelopathy (HCC) 09/25/2020   History of right  breast cancer 03/13/2020   Status post right mastectomy 03/13/2020   Abnormal gait 09/15/2018   Claustrophobia 09/15/2018   Hypertension 09/15/2018   Focal epilepsy (HCC) 09/15/2018   Multiple sclerosis (HCC) 09/15/2018   Paresthesia 09/15/2018   Chronic lower extremity pain (1ry area of Pain) (Bilateral) (R>L) 12/31/2015   Seizure (HCC) 12/31/2015   Neuropathic pain 06/02/2015   No advance directives 11/02/2012   Refusal of blood transfusion for reasons of conscience 11/02/2012   Malignant neoplasm of upper-outer quadrant of right breast in female, estrogen receptor positive (HCC) 11/01/2012    ONSET DATE: 01/14/2023 (referral date)  REFERRING DIAG: G35 (ICD-10-CM) - Multiple sclerosis (HCC) R26.9 (ICD-10-CM) - Abnormal gait M62.81 (ICD-10-CM) - Generalized muscle weakness  THERAPY DIAG:  Muscle weakness (generalized)  Other lack of coordination  Unsteadiness on feet  Other abnormalities of gait and mobility  Rationale for Evaluation and Treatment: Rehabilitation  SUBJECTIVE:  SUBJECTIVE STATEMENT: Pt presents wearing L knee hyperextension brace. Pt reports no acute changes since last visit, no falls. Pt having some pain today but not bad. Has been trying to do one of her exercises every day.   Pt accompanied by: self and family member daughter [name] (outside)  PERTINENT HISTORY: relapsing remitting MS and breast CA and seizures  PAIN:  Are you having pain? Yes: NPRS scale: 7-8/10 Pain location: legs, hips down into thighs Pain description: pins and needles, burning sensation Aggravating factors: hard to tell just gets bad sometimes Relieving factors: pain medication, sometimes movement  Took pain medicine before she came today.  PRECAUTIONS: Fall  WEIGHT BEARING RESTRICTIONS:  No  FALLS: Has patient fallen in last 6 months? No, several near falls though  LIVING ENVIRONMENT: Lives with: lives with their daughter [name] Lives in: House/apartment Stairs: No Has following equipment at home: Counselling psychologist, Environmental consultant - 4 wheeled, and shower chair *needs new rollator and needs a BSC  PLOF: Independent with basic ADLs, Independent with gait, Independent with transfers, Requires assistive device for independence, and Needs assistance with homemaking  PATIENT GOALS: "to help me move around a little bit better and improve my balance, less pain"  OBJECTIVE:   DIAGNOSTIC FINDINGS:  B hip xrays 02/01/23: FINDINGS: SI joints are patent. Pubic symphysis and rami appear intact. Mild bilateral hip degenerative changes with minimal spurring and joint space narrowing   IMPRESSION: Mild bilateral hip degenerative changes.  Lumbar spine xray 02/01/23: FINDINGS: Five non rib-bearing lumbar type vertebra. Lumbar alignment within normal limits. Vertebral body heights are maintained. No suspicious change in alignment with flexion or extension. Mild disc space narrowing L2-L3 and L4-L5. Moderate facet degenerative changes worst at L4-L5 and L5-S1.   IMPRESSION: Multilevel degenerative changes as described above.  COGNITION: Overall cognitive status: Impaired "I'm not as sharp as I used to be"   SENSATION: Decreased light touch in R lateral knee  EDEMA:  Swelling in L knee most of the time, occasionally in L ankle  POSTURE: rounded shoulders, forward head, and posterior pelvic tilt  LOWER EXTREMITY ROM:     Passive  Right Eval Left Eval  Hip flexion   [facility]   [facility]  Hip extension    Hip abduction    Hip adduction    Hip internal rotation    Hip external rotation    Knee flexion    Knee extension    Ankle dorsiflexion    Ankle plantarflexion    Ankle inversion    Ankle eversion     (Blank rows = not tested)  LOWER EXTREMITY MMT:    MMT Right Eval  Left Eval  Hip flexion 5 2-  Hip extension    Hip abduction    Hip adduction    Hip internal rotation    Hip external rotation    Knee flexion 5 2-  Knee extension 5 3  Ankle dorsiflexion 4 3  Ankle plantarflexion    Ankle inversion    Ankle eversion    (Blank rows = not tested)   TODAY'S TREATMENT:  Ther Ex Per pt request, SciFit multi-peaks level 3 for 8 minutes using BUE/BLEs for neural priming for reciprocal movement, dynamic cardiovascular warmup and increased amplitude of stepping.  RPE of 4-5/10 following activity.  In // bars for improved single leg stability, eccentric quad strength and facilitation of L knee flexion:  Alt lateral step downs from 4" box w/BUE support, x12 per side. Pt initially unable to unlock her L knee when tapping down w/RLE, but w/cues to shift weight forward rather than lean back, pt able to flex her knee w/activity.  Alt fwd step downs from 4" box w/BUE support. Min cues to avoid rotation compensation when tapping down w/RLE. X10 reps per side.   Gait pattern: step through pattern, decreased step length- Left, decreased stride length, decreased hip/knee flexion- Left, decreased ankle dorsiflexion- Left, genu recurvatum- Left, lateral lean- Right, wide BOS, and poor foot clearance- Left Distance walked: Various clinic distances  Assistive device utilized: Quad cane small base Level of assistance: SBA and CGA Comments: Pt ambulated into clinic while holding Dani's arm. Provided SBA-CGA throughout session due to intermittent hyperextension of L knee, causing pt to lose balance posteriorly.     PATIENT EDUCATION: Education details: Continue practicing sit to stands at home, continue working on [employer]  Person educated: Patient Education method: Programmer, multimedia, Facilities manager, and Handouts Education comprehension: verbalized  understanding, returned demonstration, and needs further education  HOME EXERCISE PROGRAM: From prior POC: Access Code: [email or web address] URL: [email or web address] Date: 02/15/2023 Prepared by: Peter Congo  Exercises - Sit to Stand Without Arm Support  - 1 x daily - 7 x weekly - 1 sets - 10 reps - Standing March with Counter Support  - 1 x daily - 7 x weekly - 1 sets - 10 reps - Standing Hip Abduction with Counter Support  - 1 x daily - 7 x weekly - 1 sets - 10 reps - Standing Hip Extension with Counter Support  - 1 x daily - 7 x weekly - 1 sets - 10 reps - Side Stepping with Counter Support  - 1 x daily - 7 x weekly - 1 sets - 10 reps - Seated Ankle Pumps  - 1 x daily - 7 x weekly - 3 sets - 10 reps - Standing Ankle Dorsiflexion Stretch  - 1 x daily - 7 x weekly - 3 sets - 10 reps - 30 second hold  - Supine March  - 1 x daily - 7 x weekly - 3 sets - 5-10 reps - Supine Bridge  - 1 x daily - 7 x weekly - 1 sets - 10 reps  Black = reviewed  Red = previous POC, not reviewed yet  GOALS: Goals reviewed with patient? Yes  SHORT TERM GOALS: Target date: 03/03/2023  Pt will be independent with initial HEP for improved strength, balance, transfers and gait. Baseline: Goal status:  MET  2.  Pt will improve 5 x STS to less than or equal to 18 seconds to demonstrate improved functional strength and transfer efficiency with BUE support as needed. Baseline: 21.15 sec with heavy UE reliance on arms of chair (6/27), 19.34 sec with BUE support on arms of chair (7/30) Goal status: IN PROGRESS  3.  Pt will improve normal TUG to less than or equal to 35 seconds for improved functional mobility and decreased fall risk with LRAD. Baseline: 40.37 sec with SBQC (6/27), 35 sec with SBQC (7/30) Goal status: MET  4.  Pt will improve gait velocity to at least 1.0  ft/sec for improved gait efficiency and performance at mod I level with LRAD. Baseline: 0.83 ft/sec with SBQC (6/27), 1.13 ft/sec  with SBQC (7/30) Goal status: MET   LONG TERM GOALS: Target date: 04/07/2023   Pt will be independent with final HEP for improved strength, balance, transfers and gait. Baseline:  Goal status: INITIAL  2.  Pt will improve 5 x STS to less than or equal to 18 seconds to demonstrate improved functional strength and transfer efficiency with decreased BUE support.  Baseline: 21.15 sec with heavy UE reliance on arms of chair (6/27), 19.34 sec with BUE support on arms of chair (7/30) Goal status: REVISED  3.  Pt will improve normal TUG to less than or equal to 30 seconds for improved functional mobility and decreased fall risk with LRAD. Baseline: 40.37 sec with SBQC (6/27), 35 sec with SBQC (7/30) Goal status: INITIAL  4.  Pt will improve gait velocity to at least 1.25 ft/sec for improved gait efficiency and performance at mod I level with LRAD Baseline: 0.83 ft/sec with SBQC (6/27), 1.13 ft/sec with SBQC (7/30) Goal status: INITIAL  5.  Pt will improve her score on the MSIS-29 to </= 95 points to demonstrate decreased disability level. Baseline: 105/145 (6/27) Goal status: INITIAL    ASSESSMENT:  CLINICAL IMPRESSION: Session limited by patient's late arrival. Emphasis of skilled PT session on facilitation of L knee flexion in weightbearing, improved eccentric control and endurance. Pt requested to start session on Scifit as she says it helps her L knee pain. Pt able to facilitate L knee flexion this date and had reduced pain levels by end of session. Continue POC.     OBJECTIVE IMPAIRMENTS: Abnormal gait, cardiopulmonary status limiting activity, decreased activity tolerance, decreased balance, decreased endurance, decreased mobility, difficulty walking, decreased strength, increased edema, impaired sensation, impaired UE functional use, and pain.   ACTIVITY LIMITATIONS: carrying, lifting, bending, standing, squatting, stairs, transfers, and bed mobility  PARTICIPATION LIMITATIONS:  meal prep, cleaning, laundry, driving, and community activity  PERSONAL FACTORS: Age, Sex, Time since onset of injury/illness/exacerbation, Transportation, and 1-2 comorbidities: RRMS, cancer, seizures  are also affecting patient's functional outcome.   REHAB POTENTIAL: Fair time since onset of MS, previous bouts of therapy  CLINICAL DECISION MAKING: Stable/uncomplicated  EVALUATION COMPLEXITY: Moderate  PLAN:  PT FREQUENCY: 2x/week  PT DURATION: 8 weeks  PLANNED INTERVENTIONS: Therapeutic exercises, Therapeutic activity, Neuromuscular re-education, Balance training, Gait training, Patient/Family education, Self Care, Joint mobilization, Stair training, Vestibular training, Canalith repositioning, Visual/preceptual remediation/compensation, Orthotic/Fit training, DME instructions, Aquatic Therapy, Dry Needling, Electrical stimulation, Cryotherapy, Moist heat, Taping, Manual therapy, and Re-evaluation  PLAN FOR NEXT SESSION:  REVIEW HEP. Update HEP for more challenging LLE strengthening and NMR, balance, endurance, gait training with rollator, (Pt's old genu recurvatum brace is in [place] desk), anterior weight shift with sit to stands, squats?  Could also try estim or bioness in future sessions (let her know when so she can wear shorts)    Jill Alexanders , PT, DPT [facility] [address] [place], [place]  [address] Phone:  [phone] Fax:  [phone]  03/24/2023, 11:20 AM

## 2023-03-24 NOTE — Therapy (Signed)
OUTPATIENT OCCUPATIONAL THERAPY NEURO TREATMENT  Patient Name: Selena Bender MRN: [id number] DOB:1951-02-14, 72 y.o., female Today's Date: 03/24/2023  PCP: Bethanie Dicker, NP REFERRING PROVIDER: Bethanie Dicker, NP  END OF SESSION:  OT End of Session - 03/24/23 1145     Visit Number 7    Number of Visits 12   + evaluation   Date for OT Re-Evaluation 04/15/23    Authorization Type Medicare and [employer] VL 60    Progress Note Due on Visit 10    OT Start Time 1145    OT Stop Time 1223    OT Time Calculation (min) 38 min    Equipment Utilized During Treatment Textured Balls    Activity Tolerance Patient tolerated treatment well    Behavior During Therapy [facility] for tasks assessed/performed              Past Medical History:  Diagnosis Date   Asthma    Breast cancer (HCC) 2014   Right   Chicken pox    CKD (chronic kidney disease), stage III (HCC)    COVID-19 virus infection 12/2020   Hypertension    Multiple sclerosis (HCC)    Seizures (HCC)    Past Surgical History:  Procedure Laterality Date   BREAST SURGERY     MASTECTOMY Right 2014   Patient Active Problem List   Diagnosis Date Noted   Spinal cord disease (HCC) 02/01/2023   Abnormal MRI, cervical spine (06/06/2020 & 11/13/2022) 02/01/2023   Demyelinating disease of central nervous system (HCC) 02/01/2023   Chronic radicular pain of lower extremity (S1) (Bilateral) 02/01/2023   Lower extremity burning pain 02/01/2023   Chronic low back pain (2ry area of Pain) (Bilateral) (R>L) w/ sciatica (Bilateral) 02/01/2023   Chronic hip pain (Bilateral) (L>R) 02/01/2023   Chronic upper extremity pain (intermittent) (Bilateral) 02/01/2023   Allergic rhinitis 01/14/2023   CKD (chronic kidney disease), stage III (HCC)    COVID-19 virus infection 12/2020   Myelopathy (HCC) 09/25/2020   History of right breast cancer 03/13/2020   Status post right mastectomy 03/13/2020   Abnormal gait 09/15/2018   Claustrophobia 09/15/2018    Hypertension 09/15/2018   Focal epilepsy (HCC) 09/15/2018   Multiple sclerosis (HCC) 09/15/2018   Paresthesia 09/15/2018   Chronic lower extremity pain (1ry area of Pain) (Bilateral) (R>L) 12/31/2015   Seizure (HCC) 12/31/2015   Neuropathic pain 06/02/2015   No advance directives 11/02/2012   Refusal of blood transfusion for reasons of conscience 11/02/2012   Malignant neoplasm of upper-outer quadrant of right breast in female, estrogen receptor positive (HCC) 11/01/2012    ONSET DATE: 02/04/2023  REFERRING DIAG: V78.469 (ICD-10-CM) - Left hand weakness G35 (ICD-10-CM) - Multiple sclerosis (HCC)  THERAPY DIAG:  Muscle weakness (generalized)  Other lack of coordination  Other disturbances of skin sensation  Visuospatial deficit  Rationale for Evaluation and Treatment: Rehabilitation  SUBJECTIVE:   SUBJECTIVE STATEMENT: Pt reports she can tell her left eye isn't moving out as much during her exercises today.   Pt accompanied by: self   PERTINENT HISTORY:   Pt is a 72 yr old AA female with hx of relapsing remitting MS and breast CA with mastectomy as well as seizures, chronic pain in B legs/arms and a lot of nerve pain.  PRECAUTIONS: None  WEIGHT BEARING RESTRICTIONS: No  PAIN:  Are you having pain? Yes: NPRS scale: 7/10 Pain location: legs all the way to feet and arms all the way to her hands; legs >arms Pain description: nerve  pains - pins and needles Aggravating factors: hard to say - sometimes movement is fine but sometimes it makes her feel better Relieving factors: unknown  FALLS: Has patient fallen in last 6 months? No  LIVING ENVIRONMENT: Lives with: lives with their daughter Lives in: Apartment Stairs: No Has following equipment at home: Counselling psychologist, Environmental consultant - 4 wheeled, shower chair, Grab bars, and has grab bar that needs to be installed as the suction grab bar came off the wall during a shower a few months ago - also may need elevated  BSC  PLOF: Independent with basic ADLs, Independent with household mobility with device, and Needs assistance with homemaking  PATIENT GOALS: Use my hands better.  OBJECTIVE:   HAND DOMINANCE: Right  ADLs: Overall ADLs: Pt generally MI Transfers/ambulation related to ADLs: MI with AE Eating: I Grooming: Gets hair/nails done professionally UB Dressing: Sits down to get dressed LB Dressing: Able to do it with a little bit of a struggle but "I can still do it." Toileting: MI Bathing: Showers 2x/week Tub Shower transfers: Supervision - needs grab bar replaced Equipment: Shower seat with back, Walk in shower, and Reacher  IADLs: Shopping: I go shopping sometimes (does not drive). Light housekeeping: I do my own laundry Meal Prep: Daughter does most of it. Every now and then I get in the kitchen. Community mobility: Uses cane in public and walker at home (needs a new rollator due to poor brakes etc) - may need rx for new walker Medication management: Self managed with pill bottles - only uses a pill box only when she goes away/vacation Financial management: Self managed Handwriting: 50% legible   MOBILITY STATUS: Needs Assist: uses AE - rollator at home or cane in public  POSTURE COMMENTS:  No Significant postural limitations Sitting balance: WFL  ACTIVITY TOLERANCE: Activity tolerance: Patient reports only doing 1 thing/day ie) shopping, appt or getting nails done.  Needs to take breaks ie) when doing her hair.  FUNCTIONAL OUTCOME MEASURES: Quick Dash: 34.1 % disability   UPPER EXTREMITY ROM:    Active ROM Right eval Left eval  Shoulder flexion [facility]   Shoulder abduction    Shoulder adduction    Shoulder extension    Shoulder internal rotation    Shoulder external rotation    Elbow flexion    Elbow extension  Decreased end range  Wrist flexion    Wrist extension    Wrist ulnar deviation    Wrist radial deviation    Wrist pronation    Wrist supination    (Blank  rows = not tested)  UPPER EXTREMITY MMT:     MMT Right eval Left eval  Shoulder flexion 4/5 3/5  Shoulder abduction    Shoulder adduction    Shoulder extension    Shoulder internal rotation    Shoulder external rotation    Middle trapezius    Lower trapezius    Elbow flexion    Elbow extension    Wrist flexion    Wrist extension    Wrist ulnar deviation    Wrist radial deviation    Wrist pronation    Wrist supination    (Blank rows = not tested)  HAND FUNCTION: Grip strength: Right: 33.2, 44.9, 35.0  lbs; Left: 19.8, 23.5, 21.8 lbs Evaluation Average: Right 37.7 lbs Left 21.7 lbs  COORDINATION: 9 Hole Peg test: Right: 47.93 sec; Left: 2:00:03 sec Box and Blocks:  Right 22 blocks, Left 18 blocks  SENSATION: Light touch: Impaired  Patient reports R side intact L palmar surface a bit dull and tingly in comparison.  EDEMA: NS  MUSCLE TONE:   COGNITION: Overall cognitive status:  Patient does report some changes in memory  VISION: Subjective report: L eye wanders and doesn't focus (does not like to look in the mirror due to changes which are also noted and pointed out by other family members) Baseline vision: Bifocals Visual history: NA  VISION ASSESSMENT: Impaired Tracking/Visual pursuits: Decreased smoothness with horizontal tracking  Patient has difficulty with following activities due to following visual impairments: eye wanders  PERCEPTION: Not tested  PRAXIS: Not tested  Evaluation OBSERVATIONS: Patient is a well groomed lady who regularly has her nails manicured.  She has L pinkie finger often held in a tight hook position often with difficulty extending it actively.  Awkward positioning noted with pegboard test with L arm ie) alternating between turning wrist pronated/supinated at times.    TODAY'S TREATMENT:                                                                                                                              DATE:   -  Self-care/home management completed for duration as noted below including: OT educated patient on the 4 Ps of energy conservation including positioning, pace, prioritizing, and planning to maximize efficiency and safety with completion of functional activities as desired.  Patient verbalized understanding of all information provided and was given a packet to take home for review.  - Therapeutic exercises completed for duration as noted below including:  OT reviewed vision HEP as noted in pt instructions. Mod cues and use of adaptions required to improve accuracy.   OT educated pt on use of putty, knife, and fork to simulate cutting will improving LUE strength and coordination.   PATIENT EDUCATION: Education details: Pencil push HEP; progress towards goals.  Person educated: Patient Education method: Explanation, Demonstration, Tactile cues, Verbal cues, and Handouts Education comprehension: verbalized understanding, returned demonstration, verbal cues required, tactile cues required, and needs further education  HOME EXERCISE PROGRAM: 03/01/23: Coordination activities with images provided 03/08/23: Diplopia HEP 03/10/23: Therapy Putty Exercises - Access Code: [email or web address] 03/15/23: Wrist and Digital extension - Access Code: [email or web address] 03/22/2023: Pencil pushes and back and forth vision HEP  GOALS: Goals reviewed with patient? Yes  SHORT TERM GOALS: Target date: 03/17/2023    Patient will demonstrate BUE HEP (ROM, strength & coordination) with 25% verbal cues or less for proper execution. Baseline: not yet initiated Goal status: IN PROGRESS  2.  Pt will verbalize understanding of adapted strategies/equipment to maximize safety and independence with ADLs/IADLs. Baseline: not yet initiated Goal status: INITIAL  3.  Patient will demonstrate at least 40 lbs R grip and 25 lbs L grip strength as needed to open jars and other containers.  Baseline: Evaluation Average: Right 37.7 lbs Left 21.7  lbs 03/22/2023: Right 57.5 lbs Left 21.6 lbs (met on R)  Goal statues: IN PROGRESS  4. Patient will be aware of sensory stimulation and compensatory strategies for diminished sensation.  Baseline: Patient reports R side intact L palmar surface a bit dull and tingly in comparison.  Goal Status: IN PROGRESS  5.  Pt will be able to place at least 5 more blocks with each hand with completion of Box and Blocks test. Baseline: Box and Blocks:  Right 22 blocks, Left 18 blocks 03/22/2023: Right 22 blocks, Left 18 blocks Goal status: IN PROGRESS  6.  Pt will demonstrate use of binder for information organization of HEPs, MS resources and medical records. Baseline: not yet initiated - daughter present at eval for recommendation Goal status: IN PROGRESS  LONG TERM GOALS: Target date: 04/08/2023    Pt will verbalize understanding (or locate information in info binder) re: ways to prevent future MS related complications and MS community resources. Baseline: not yet initiated Goal status: INITIAL  2.  Pt will verbalize understanding of ways to keep thinking skills sharp and ways to compensate for STM changes in the future. Baseline: not yet initiated Goal status: INITIAL  3.  Pt will demonstrate improved fine motor coordination for ADLs as evidenced by decreasing 9 hole peg test score for BUE by 10-15 secs. Baseline: 9 Hole Peg test: Right: 47.93 sec; Left: 2:00:03 sec 03/22/2023: Right: 46.26 sec; Left: 141 sec Goal status: IN PROGRESS  4.  Pt will demonstrate improved positioning of L pinkie finger through proper day and/or nighttime splinting as deemed effective, comfortable and appropriate. Baseline: poor active extension of PIP joint Goal status: IN PROGRESS  5.  Patient will demonstrate at least 16% improvement with quick Dash score (reporting 18% disability or less) indicating improved functional use of affected extremity. Baseline: 34% disability Goal status: INITIAL       ASSESSMENT:  CLINICAL IMPRESSION:  Pt demonstrating little progress towards goals. Would benefit from HEP review and reinforcement of exercises to help with efforts to progress towards goals.   PERFORMANCE DEFICITS: in functional skills including ADLs, coordination, dexterity, sensation, edema, ROM, strength, pain, flexibility, Fine motor control, Gross motor control, mobility, balance, endurance, decreased knowledge of use of DME, and UE functional use, cognitive skills including attention and memory, and psychosocial skills including coping strategies and routines and behaviors.   IMPAIRMENTS: are limiting patient from ADLs, rest and sleep, leisure, and social participation.   CO-MORBIDITIES: may have co-morbidities  that affects occupational performance. Patient will benefit from skilled OT to address above impairments and improve overall function.  REHAB POTENTIAL: Good  PLAN:  OT FREQUENCY: 1-2x/week  OT DURATION: 6 weeks  PLANNED INTERVENTIONS: self care/ADL training, therapeutic exercise, therapeutic activity, neuromuscular re-education, balance training, functional mobility training, splinting, patient/family education, cognitive remediation/compensation, visual/perceptual remediation/compensation, energy conservation, coping strategies training, and DME and/or AE instructions  RECOMMENDED OTHER SERVICES: May need prescriptions for [facility] and new rollator.  Also may need eye exam.  CONSULTED AND AGREED WITH PLAN OF CARE: Patient and family member/caregiver  PLAN FOR NEXT SESSIONS:  MS Education BINDER ie) resources, AE, exercises etc from HEPs already  Sent request for BSC/rollator replacement. Any updates?  Review HEP for UE ROM, coordination, strength (putty intro'd 8/1)    sensory stimulation/compensatory strategies (will need handouts)  Delana Meyer, OT 03/24/2023, 1:53 PM

## 2023-03-29 ENCOUNTER — Ambulatory Visit: Payer: Medicare Other | Admitting: Occupational Therapy

## 2023-03-29 ENCOUNTER — Ambulatory Visit: Payer: Medicare Other | Admitting: Physical Therapy

## 2023-03-29 DIAGNOSIS — R2681 Unsteadiness on feet: Secondary | ICD-10-CM | POA: Diagnosis not present

## 2023-03-29 DIAGNOSIS — R2689 Other abnormalities of gait and mobility: Secondary | ICD-10-CM

## 2023-03-29 DIAGNOSIS — M6281 Muscle weakness (generalized): Secondary | ICD-10-CM

## 2023-03-29 DIAGNOSIS — R41844 Frontal lobe and executive function deficit: Secondary | ICD-10-CM

## 2023-03-29 DIAGNOSIS — R41842 Visuospatial deficit: Secondary | ICD-10-CM

## 2023-03-29 DIAGNOSIS — R278 Other lack of coordination: Secondary | ICD-10-CM | POA: Diagnosis not present

## 2023-03-29 DIAGNOSIS — R208 Other disturbances of skin sensation: Secondary | ICD-10-CM | POA: Diagnosis not present

## 2023-03-29 NOTE — Therapy (Unsigned)
OUTPATIENT OCCUPATIONAL THERAPY NEURO TREATMENT  Patient Name: Selena Bender MRN: [id number] DOB:July 08, 1951, 72 y.o., female Today's Date: 03/29/2023  PCP: Bethanie Dicker, NP REFERRING PROVIDER: Bethanie Dicker, NP  END OF SESSION:  OT End of Session - 03/29/23 1026     Visit Number 8    Number of Visits 12   + evaluation   Date for OT Re-Evaluation 04/15/23    Authorization Type Medicare and [employer] VL 60    Progress Note Due on Visit 10    OT Start Time 1025    OT Stop Time 1100    OT Time Calculation (min) 35 min    Activity Tolerance Patient tolerated treatment well    Behavior During Therapy WFL for tasks assessed/performed;Restless              Past Medical History:  Diagnosis Date   Asthma    Breast cancer (HCC) 2014   Right   Chicken pox    CKD (chronic kidney disease), stage III (HCC)    COVID-19 virus infection 12/2020   Hypertension    Multiple sclerosis (HCC)    Seizures (HCC)    Past Surgical History:  Procedure Laterality Date   BREAST SURGERY     MASTECTOMY Right 2014   Patient Active Problem List   Diagnosis Date Noted   Spinal cord disease (HCC) 02/01/2023   Abnormal MRI, cervical spine (06/06/2020 & 11/13/2022) 02/01/2023   Demyelinating disease of central nervous system (HCC) 02/01/2023   Chronic radicular pain of lower extremity (S1) (Bilateral) 02/01/2023   Lower extremity burning pain 02/01/2023   Chronic low back pain (2ry area of Pain) (Bilateral) (R>L) w/ sciatica (Bilateral) 02/01/2023   Chronic hip pain (Bilateral) (L>R) 02/01/2023   Chronic upper extremity pain (intermittent) (Bilateral) 02/01/2023   Allergic rhinitis 01/14/2023   CKD (chronic kidney disease), stage III (HCC)    COVID-19 virus infection 12/2020   Myelopathy (HCC) 09/25/2020   History of right breast cancer 03/13/2020   Status post right mastectomy 03/13/2020   Abnormal gait 09/15/2018   Claustrophobia 09/15/2018   Hypertension 09/15/2018   Focal epilepsy  (HCC) 09/15/2018   Multiple sclerosis (HCC) 09/15/2018   Paresthesia 09/15/2018   Chronic lower extremity pain (1ry area of Pain) (Bilateral) (R>L) 12/31/2015   Seizure (HCC) 12/31/2015   Neuropathic pain 06/02/2015   No advance directives 11/02/2012   Refusal of blood transfusion for reasons of conscience 11/02/2012   Malignant neoplasm of upper-outer quadrant of right breast in female, estrogen receptor positive (HCC) 11/01/2012    ONSET DATE: 02/04/2023  REFERRING DIAG: U44.034 (ICD-10-CM) - Left hand weakness G35 (ICD-10-CM) - Multiple sclerosis (HCC)  THERAPY DIAG:  Muscle weakness (generalized)  Other lack of coordination  Frontal lobe and executive function deficit  Visuospatial deficit  Rationale for Evaluation and Treatment: Rehabilitation  SUBJECTIVE:   SUBJECTIVE STATEMENT: Pt reports she can tell her left eye isn't moving out as much during her exercises today.   Pt accompanied by: self   PERTINENT HISTORY:   Pt is a 72 yr old AA female with hx of relapsing remitting MS and breast CA with mastectomy as well as seizures, chronic pain in B legs/arms and a lot of nerve pain.  PRECAUTIONS: None  WEIGHT BEARING RESTRICTIONS: No  PAIN:  Are you having pain? Yes: NPRS scale: 7/10 Pain location: legs all the way to feet and arms all the way to her hands; legs >arms Pain description: nerve pains - pins and needles Aggravating factors: hard  to say - sometimes movement is fine but sometimes it makes her feel better Relieving factors: unknown  FALLS: Has patient fallen in last 6 months? No  LIVING ENVIRONMENT: Lives with: lives with their daughter Lives in: Apartment Stairs: No Has following equipment at home: Counselling psychologist, Environmental consultant - 4 wheeled, shower chair, Grab bars, and has grab bar that needs to be installed as the suction grab bar came off the wall during a shower a few months ago - also may need elevated BSC  PLOF: Independent with basic ADLs,  Independent with household mobility with device, and Needs assistance with homemaking  PATIENT GOALS: Use my hands better.  OBJECTIVE:   HAND DOMINANCE: Right  ADLs: Overall ADLs: Pt generally MI Transfers/ambulation related to ADLs: MI with AE Eating: I Grooming: Gets hair/nails done professionally UB Dressing: Sits down to get dressed LB Dressing: Able to do it with a little bit of a struggle but "I can still do it." Toileting: MI Bathing: Showers 2x/week Tub Shower transfers: Supervision - needs grab bar replaced Equipment: Shower seat with back, Walk in shower, and Reacher  IADLs: Shopping: I go shopping sometimes (does not drive). Light housekeeping: I do my own laundry Meal Prep: Daughter does most of it. Every now and then I get in the kitchen. Community mobility: Uses cane in public and walker at home (needs a new rollator due to poor brakes etc) - may need rx for new walker Medication management: Self managed with pill bottles - only uses a pill box only when she goes away/vacation Financial management: Self managed Handwriting: 50% legible   MOBILITY STATUS: Needs Assist: uses AE - rollator at home or cane in public  POSTURE COMMENTS:  No Significant postural limitations Sitting balance: WFL  ACTIVITY TOLERANCE: Activity tolerance: Patient reports only doing 1 thing/day ie) shopping, appt or getting nails done.  Needs to take breaks ie) when doing her hair.  FUNCTIONAL OUTCOME MEASURES: Quick Dash: 34.1 % disability   UPPER EXTREMITY ROM:    Active ROM Right eval Left eval  Shoulder flexion [facility]   Shoulder abduction    Shoulder adduction    Shoulder extension    Shoulder internal rotation    Shoulder external rotation    Elbow flexion    Elbow extension  Decreased end range  Wrist flexion    Wrist extension    Wrist ulnar deviation    Wrist radial deviation    Wrist pronation    Wrist supination    (Blank rows = not tested)  UPPER EXTREMITY  MMT:     MMT Right eval Left eval  Shoulder flexion 4/5 3/5  Shoulder abduction    Shoulder adduction    Shoulder extension    Shoulder internal rotation    Shoulder external rotation    Middle trapezius    Lower trapezius    Elbow flexion    Elbow extension    Wrist flexion    Wrist extension    Wrist ulnar deviation    Wrist radial deviation    Wrist pronation    Wrist supination    (Blank rows = not tested)  HAND FUNCTION: Grip strength: Right: 33.2, 44.9, 35.0  lbs; Left: 19.8, 23.5, 21.8 lbs Evaluation Average: Right 37.7 lbs Left 21.7 lbs  COORDINATION: 9 Hole Peg test: Right: 47.93 sec; Left: 2:00:03 sec Box and Blocks:  Right 22 blocks, Left 18 blocks  SENSATION: Light touch: Impaired  Patient reports R side intact L palmar surface  a bit dull and tingly in comparison.  EDEMA: NS  MUSCLE TONE:   COGNITION: Overall cognitive status:  Patient does report some changes in memory  VISION: Subjective report: L eye wanders and doesn't focus (does not like to look in the mirror due to changes which are also noted and pointed out by other family members) Baseline vision: Bifocals Visual history: NA  VISION ASSESSMENT: Impaired Tracking/Visual pursuits: Decreased smoothness with horizontal tracking  Patient has difficulty with following activities due to following visual impairments: eye wanders  PERCEPTION: Not tested  PRAXIS: Not tested  Evaluation OBSERVATIONS: Patient is a well groomed lady who regularly has her nails manicured.  She has L pinkie finger often held in a tight hook position often with difficulty extending it actively.  Awkward positioning noted with pegboard test with L arm ie) alternating between turning wrist pronated/supinated at times.    TODAY'S TREATMENT:                                                                                                                              DATE:   - Self-care/home management completed for  duration as noted below including: OT educated patient on the 4 Ps of energy conservation including positioning, pace, prioritizing, and planning to maximize efficiency and safety with completion of functional activities as desired.  Patient verbalized understanding of all information provided and was given a packet to take home for review.  - Therapeutic exercises completed for duration as noted below including:  OT reviewed vision HEP as noted in pt instructions. Mod cues and use of adaptions required to improve accuracy.   OT educated pt on use of putty, knife, and fork to simulate cutting will improving LUE strength and coordination.   PATIENT EDUCATION: Education details: Pencil push HEP; progress towards goals.  Person educated: Patient Education method: Explanation, Demonstration, Tactile cues, Verbal cues, and Handouts Education comprehension: verbalized understanding, returned demonstration, verbal cues required, tactile cues required, and needs further education  HOME EXERCISE PROGRAM: 03/01/23: Coordination activities with images provided 03/08/23: Diplopia HEP 03/10/23: Therapy Putty Exercises - Access Code: [email or web address] 03/15/23: Wrist and Digital extension - Access Code: [email or web address] 03/22/2023: Pencil pushes and back and forth vision HEP  GOALS: Goals reviewed with patient? Yes  SHORT TERM GOALS: Target date: 03/17/2023    Patient will demonstrate BUE HEP (ROM, strength & coordination) with 25% verbal cues or less for proper execution. Baseline: not yet initiated Goal status: IN PROGRESS  2.  Pt will verbalize understanding of adapted strategies/equipment to maximize safety and independence with ADLs/IADLs. Baseline: not yet initiated Goal status: INITIAL  3.  Patient will demonstrate at least 40 lbs R grip and 25 lbs L grip strength as needed to open jars and other containers.  Baseline: Evaluation Average: Right 37.7 lbs Left 21.7 lbs 03/22/2023: Right 57.5 lbs Left 21.6 lbs (met  on R)  Goal statues: IN PROGRESS  4. Patient will be aware of sensory  stimulation and compensatory strategies for diminished sensation.  Baseline: Patient reports R side intact L palmar surface a bit dull and tingly in comparison.  Goal Status: IN PROGRESS  5.  Pt will be able to place at least 5 more blocks with each hand with completion of Box and Blocks test. Baseline: Box and Blocks:  Right 22 blocks, Left 18 blocks 03/22/2023: Right 22 blocks, Left 18 blocks Goal status: IN PROGRESS  6.  Pt will demonstrate use of binder for information organization of HEPs, MS resources and medical records. Baseline: not yet initiated - daughter present at eval for recommendation Goal status: IN PROGRESS  LONG TERM GOALS: Target date: 04/08/2023    Pt will verbalize understanding (or locate information in info binder) re: ways to prevent future MS related complications and MS community resources. Baseline: not yet initiated Goal status: IN Progress  2.  Pt will verbalize understanding of ways to keep thinking skills sharp and ways to compensate for STM changes in the future. Baseline: not yet initiated Goal status: IN progress  3.  Pt will demonstrate improved fine motor coordination for ADLs as evidenced by decreasing 9 hole peg test score for BUE by 10-15 secs. Baseline: 9 Hole Peg test: Right: 47.93 sec; Left: 2:00:03 sec 03/22/2023: Right: 46.26 sec; Left: 141 sec Goal status: IN PROGRESS  4.  Pt will demonstrate improved positioning of L pinkie finger through proper day and/or nighttime splinting as deemed effective, comfortable and appropriate. Baseline: poor active extension of PIP joint Goal status: IN PROGRESS  5.  Patient will demonstrate at least 16% improvement with quick Dash score (reporting 18% disability or less) indicating improved functional use of affected extremity. Baseline: 34% disability Goal status: INITIAL      ASSESSMENT:  CLINICAL IMPRESSION:  Pt  demonstrating little progress towards goals. Would benefit from HEP review and reinforcement of exercises to help with efforts to progress towards goals.   PERFORMANCE DEFICITS: in functional skills including ADLs, coordination, dexterity, sensation, edema, ROM, strength, pain, flexibility, Fine motor control, Gross motor control, mobility, balance, endurance, decreased knowledge of use of DME, and UE functional use, cognitive skills including attention and memory, and psychosocial skills including coping strategies and routines and behaviors.   IMPAIRMENTS: are limiting patient from ADLs, rest and sleep, leisure, and social participation.   CO-MORBIDITIES: may have co-morbidities  that affects occupational performance. Patient will benefit from skilled OT to address above impairments and improve overall function.  REHAB POTENTIAL: Good  PLAN:  OT FREQUENCY: 1-2x/week  OT DURATION: 6 weeks  PLANNED INTERVENTIONS: self care/ADL training, therapeutic exercise, therapeutic activity, neuromuscular re-education, balance training, functional mobility training, splinting, patient/family education, cognitive remediation/compensation, visual/perceptual remediation/compensation, energy conservation, coping strategies training, and DME and/or AE instructions  RECOMMENDED OTHER SERVICES: May need prescriptions for [facility] and new rollator.  Also may need eye exam.  CONSULTED AND AGREED WITH PLAN OF CARE: Patient and family member/caregiver  PLAN FOR NEXT SESSIONS:  Yes MS Education BINDER ie) resources, AE, exercises etc from HEPs already  Yes Sent request for BSC/rollator replacement. Any updates?  Review HEP for UE ROM, coordination, strength (putty intro'd 8/1)    sensory stimulation/compensatory strategies (will need handouts)  Victorino Sparrow, OT 03/29/2023, 12:12 PM

## 2023-03-29 NOTE — Therapy (Signed)
OUTPATIENT PHYSICAL THERAPY NEURO TREATMENT   Patient Name: Selena Bender MRN: [id number] DOB:[birth year], 72 y.o., female Today's Date: 03/29/2023   PCP: Bethanie Dicker, NP REFERRING PROVIDER: Bethanie Dicker, NP   END OF SESSION:  PT End of Session - 03/29/23 1104     Visit Number 11    Number of Visits 17   with eval   Date for PT Re-Evaluation 04/28/23    Authorization Type Medicare    Progress Note Due on Visit 10    PT Start Time 1101    PT Stop Time 1145    PT Time Calculation (min) 44 min    Equipment Utilized During Treatment Gait belt    Activity Tolerance Patient tolerated treatment well    Behavior During Therapy WFL for tasks assessed/performed                   Past Medical History:  Diagnosis Date   Asthma    Breast cancer (HCC) 2014   Right   Chicken pox    CKD (chronic kidney disease), stage III (HCC)    COVID-19 virus infection 12/2020   Hypertension    Multiple sclerosis (HCC)    Seizures (HCC)    Past Surgical History:  Procedure Laterality Date   BREAST SURGERY     MASTECTOMY Right 2014   Patient Active Problem List   Diagnosis Date Noted   Spinal cord disease (HCC) 02/01/2023   Abnormal MRI, cervical spine (06/06/2020 & 11/13/2022) 02/01/2023   Demyelinating disease of central nervous system (HCC) 02/01/2023   Chronic radicular pain of lower extremity (S1) (Bilateral) 02/01/2023   Lower extremity burning pain 02/01/2023   Chronic low back pain (2ry area of Pain) (Bilateral) (R>L) w/ sciatica (Bilateral) 02/01/2023   Chronic hip pain (Bilateral) (L>R) 02/01/2023   Chronic upper extremity pain (intermittent) (Bilateral) 02/01/2023   Allergic rhinitis 01/14/2023   CKD (chronic kidney disease), stage III (HCC)    COVID-19 virus infection 12/2020   Myelopathy (HCC) 09/25/2020   History of right breast cancer 03/13/2020   Status post right mastectomy 03/13/2020   Abnormal gait 09/15/2018   Claustrophobia 09/15/2018    Hypertension 09/15/2018   Focal epilepsy (HCC) 09/15/2018   Multiple sclerosis (HCC) 09/15/2018   Paresthesia 09/15/2018   Chronic lower extremity pain (1ry area of Pain) (Bilateral) (R>L) 12/31/2015   Seizure (HCC) 12/31/2015   Neuropathic pain 06/02/2015   No advance directives 11/02/2012   Refusal of blood transfusion for reasons of conscience 11/02/2012   Malignant neoplasm of upper-outer quadrant of right breast in female, estrogen receptor positive (HCC) 11/01/2012    ONSET DATE: 01/14/2023 (referral date)  REFERRING DIAG: G35 (ICD-10-CM) - Multiple sclerosis (HCC) R26.9 (ICD-10-CM) - Abnormal gait M62.81 (ICD-10-CM) - Generalized muscle weakness  THERAPY DIAG:  Muscle weakness (generalized)  Unsteadiness on feet  Other lack of coordination  Other abnormalities of gait and mobility  Rationale for Evaluation and Treatment: Rehabilitation  SUBJECTIVE:  SUBJECTIVE STATEMENT: Pt presents w/SBQC, not wearing hyperextension brace today. Reports she feels "stiffness" in her legs now that make it difficulty for her to move, feels it in her kneecaps. Started about a week ago. States it feels like something is "holding" her knees. Feels it more in her LLE > RLE. No falls. Having more pain today.   Pt accompanied by: self and family member daughter [name] (outside)  PERTINENT HISTORY: relapsing remitting MS and breast CA and seizures  PAIN:  Are you having pain? Yes: NPRS scale: 7-8/10 Pain location: legs, hips down into thighs Pain description: pins and needles, burning sensation Aggravating factors: hard to tell just gets bad sometimes Relieving factors: pain medication, sometimes movement  Took pain medicine before she came today.  PRECAUTIONS: Fall  WEIGHT BEARING RESTRICTIONS:  No  FALLS: Has patient fallen in last 6 months? No, several near falls though  LIVING ENVIRONMENT: Lives with: lives with their daughter [name] Lives in: House/apartment Stairs: No Has following equipment at home: Counselling psychologist, Environmental consultant - 4 wheeled, and shower chair *needs new rollator and needs a BSC  PLOF: Independent with basic ADLs, Independent with gait, Independent with transfers, Requires assistive device for independence, and Needs assistance with homemaking  PATIENT GOALS: "to help me move around a little bit better and improve my balance, less pain"  OBJECTIVE:   DIAGNOSTIC FINDINGS:  B hip xrays 02/01/23: FINDINGS: SI joints are patent. Pubic symphysis and rami appear intact. Mild bilateral hip degenerative changes with minimal spurring and joint space narrowing   IMPRESSION: Mild bilateral hip degenerative changes.  Lumbar spine xray 02/01/23: FINDINGS: Five non rib-bearing lumbar type vertebra. Lumbar alignment within normal limits. Vertebral body heights are maintained. No suspicious change in alignment with flexion or extension. Mild disc space narrowing L2-L3 and L4-L5. Moderate facet degenerative changes worst at L4-L5 and L5-S1.   IMPRESSION: Multilevel degenerative changes as described above.  COGNITION: Overall cognitive status: Impaired "I'm not as sharp as I used to be"   SENSATION: Decreased light touch in R lateral knee  EDEMA:  Swelling in L knee most of the time, occasionally in L ankle  POSTURE: rounded shoulders, forward head, and posterior pelvic tilt  LOWER EXTREMITY ROM:     Passive  Right Eval Left Eval  Hip flexion   [facility]   [facility]  Hip extension    Hip abduction    Hip adduction    Hip internal rotation    Hip external rotation    Knee flexion    Knee extension    Ankle dorsiflexion    Ankle plantarflexion    Ankle inversion    Ankle eversion     (Blank rows = not tested)  LOWER EXTREMITY MMT:    MMT Right Eval  Left Eval  Hip flexion 5 2-  Hip extension    Hip abduction    Hip adduction    Hip internal rotation    Hip external rotation    Knee flexion 5 2-  Knee extension 5 3  Ankle dorsiflexion 4 3  Ankle plantarflexion    Ankle inversion    Ankle eversion    (Blank rows = not tested)   TODAY'S TREATMENT:  Ther Ex SciFit multi-peaks level 5 for 8 minutes using BUE/BLEs for neural priming for reciprocal movement, dynamic cardiovascular warmup and increased amplitude of stepping.  RPE of 510 following activity.   NMR  Assessed tone of BLEs and noted MAS of 2 in pt's L quad  At mat table, spanish squats w/blue resistance band for improved quad strength, transfers and assessment of exercise on tone, x12 reps. Placed airex under pt's bottom for ease of transfer, as pt unable to transfer from low mat height without UE support. Pt initially losing balance anteriorly, requiring min A to stabilize. With fatigue, pt losing balance posteriorly and bracing against mat to stabilize. Assessed tone in LLE following activity, still at MAS of 2  At ballet bar, alt standing knee flexion to work on hamstring strength and single leg stability. Pt unable to flex L knee w/activity, so progressed to alt retro stepping off 6" step w/BUE support, x12 reps per side. Max verbal cues to reduce circumduction of LLE when placing leg off and onto step and pt able to facilitate slight knee flexion w/cues. Pt also able to perform knee flexion when stepping backwards w/RLE throughout. Pt reported fatigue w/activity and noted slight knee buckling as pt ambulated 50' back to mat table.   Gait Training  Gait pattern:  ER of LLE, step through pattern, decreased stride length, decreased hip/knee flexion- Left, decreased ankle dorsiflexion- Left, lateral hip instability, wide BOS, and poor foot clearance-  Left Distance walked: 115' Assistive device utilized: Environmental consultant - 4 wheeled Level of assistance: SBA Comments: Reviewed proper use of rollator, as pt would like a new one. Pt unable to recall proper brake management and required min cues to remind her of brakes when sitting down. Pt does demonstrate increased cadence, step length and step clearance w/rollator compared to [facility]. Encouraged pt to call Dr. Dahlia Client office to request a referral for a rollator, pt verbalized understanding.    Gait pattern: step through pattern, decreased step length- Left, decreased stride length, decreased hip/knee flexion- Left, decreased ankle dorsiflexion- Left, genu recurvatum- Left, lateral lean- Right, wide BOS, and poor foot clearance- Left Distance walked: Various clinic distances  Assistive device utilized: Quad cane small base Level of assistance: CGA Comments: Pt ambulated into clinic w/SBQC. Escorted pt out of session due to fatigue of LLE, increased posterior lean and catching of L foot     PATIENT EDUCATION: Education details: Continue HEP, requesting order for rollator  Person educated: Patient Education method: Medical illustrator Education comprehension: verbalized understanding, returned demonstration, and needs further education  HOME EXERCISE PROGRAM: From prior POC: Access Code: [email or web address] URL: [email or web address] Date: 02/15/2023 Prepared by: Peter Congo  Exercises - Sit to Stand Without Arm Support  - 1 x daily - 7 x weekly - 1 sets - 10 reps - Standing March with Counter Support  - 1 x daily - 7 x weekly - 1 sets - 10 reps - Standing Hip Abduction with Counter Support  - 1 x daily - 7 x weekly - 1 sets - 10 reps - Standing Hip Extension with Counter Support  - 1 x daily - 7 x weekly - 1 sets - 10 reps - Side Stepping with Counter Support  - 1 x daily - 7 x weekly - 1 sets - 10 reps - Seated Ankle Pumps  - 1 x daily - 7 x weekly - 3 sets - 10 reps -  Standing Ankle Dorsiflexion Stretch  - 1 x daily - 7 x  weekly - 3 sets - 10 reps - 30 second hold  - Supine March  - 1 x daily - 7 x weekly - 3 sets - 5-10 reps - Supine Bridge  - 1 x daily - 7 x weekly - 1 sets - 10 reps  Black = reviewed  Red = previous POC, not reviewed yet  GOALS: Goals reviewed with patient? Yes  SHORT TERM GOALS: Target date: 03/03/2023  Pt will be independent with initial HEP for improved strength, balance, transfers and gait. Baseline: Goal status:  MET  2.  Pt will improve 5 x STS to less than or equal to 18 seconds to demonstrate improved functional strength and transfer efficiency with BUE support as needed. Baseline: 21.15 sec with heavy UE reliance on arms of chair (6/27), 19.34 sec with BUE support on arms of chair (7/30) Goal status: IN PROGRESS  3.  Pt will improve normal TUG to less than or equal to 35 seconds for improved functional mobility and decreased fall risk with LRAD. Baseline: 40.37 sec with SBQC (6/27), 35 sec with SBQC (7/30) Goal status: MET  4.  Pt will improve gait velocity to at least 1.0 ft/sec for improved gait efficiency and performance at mod I level with LRAD. Baseline: 0.83 ft/sec with SBQC (6/27), 1.13 ft/sec with SBQC (7/30) Goal status: MET   LONG TERM GOALS: Target date: 04/07/2023   Pt will be independent with final HEP for improved strength, balance, transfers and gait. Baseline:  Goal status: INITIAL  2.  Pt will improve 5 x STS to less than or equal to 18 seconds to demonstrate improved functional strength and transfer efficiency with decreased BUE support.  Baseline: 21.15 sec with heavy UE reliance on arms of chair (6/27), 19.34 sec with BUE support on arms of chair (7/30) Goal status: REVISED  3.  Pt will improve normal TUG to less than or equal to 30 seconds for improved functional mobility and decreased fall risk with LRAD. Baseline: 40.37 sec with SBQC (6/27), 35 sec with SBQC (7/30) Goal status:  INITIAL  4.  Pt will improve gait velocity to at least 1.25 ft/sec for improved gait efficiency and performance at mod I level with LRAD Baseline: 0.83 ft/sec with SBQC (6/27), 1.13 ft/sec with SBQC (7/30) Goal status: INITIAL  5.  Pt will improve her score on the MSIS-29 to </= 95 points to demonstrate decreased disability level. Baseline: 105/145 (6/27) Goal status: INITIAL    ASSESSMENT:  CLINICAL IMPRESSION: Emphasis of skilled PT session on facilitation of L knee flexion, gait training w/rollator and BLE strength. Pt reports that she started feeling a "tightness" in her knees last week. Feels as though they are being "held" and they burn. Assessed pt's tone and noted a 2 on MAS of her L quad which did not change w/fatigue. Encouraged pt to speak to Dr. Berline Chough about this. Pt does demonstrate increased step clearance and length w/rollator vs SBQC, but requires min cues for proper brake management and sequencing turns. Continue POC.     OBJECTIVE IMPAIRMENTS: Abnormal gait, cardiopulmonary status limiting activity, decreased activity tolerance, decreased balance, decreased endurance, decreased mobility, difficulty walking, decreased strength, increased edema, impaired sensation, impaired UE functional use, and pain.   ACTIVITY LIMITATIONS: carrying, lifting, bending, standing, squatting, stairs, transfers, and bed mobility  PARTICIPATION LIMITATIONS: meal prep, cleaning, laundry, driving, and community activity  PERSONAL FACTORS: Age, Sex, Time since onset of injury/illness/exacerbation, Transportation, and 1-2 comorbidities: RRMS, cancer, seizures  are also affecting patient's functional  outcome.   REHAB POTENTIAL: Fair time since onset of MS, previous bouts of therapy  CLINICAL DECISION MAKING: Stable/uncomplicated  EVALUATION COMPLEXITY: Moderate  PLAN:  PT FREQUENCY: 2x/week  PT DURATION: 8 weeks  PLANNED INTERVENTIONS: Therapeutic exercises, Therapeutic activity,  Neuromuscular re-education, Balance training, Gait training, Patient/Family education, Self Care, Joint mobilization, Stair training, Vestibular training, Canalith repositioning, Visual/preceptual remediation/compensation, Orthotic/Fit training, DME instructions, Aquatic Therapy, Dry Needling, Electrical stimulation, Cryotherapy, Moist heat, Taping, Manual therapy, and Re-evaluation  PLAN FOR NEXT SESSION:  REVIEW HEP. Update HEP for more challenging LLE strengthening and NMR, balance, endurance, gait training with rollator, (Pt's old genu recurvatum brace is in [place] desk), anterior weight shift with sit to stands, squats? Practice w/rollator   Could also try estim or bioness in future sessions (let her know when so she can wear shorts)    Jill Alexanders Vergene Marland, PT, DPT [facility] [address] [place], [place]  [address] Phone:  [phone] Fax:  [phone]  03/29/2023, 11:54 AM

## 2023-03-31 ENCOUNTER — Ambulatory Visit: Payer: Medicare Other | Admitting: Occupational Therapy

## 2023-03-31 ENCOUNTER — Ambulatory Visit: Payer: Medicare Other | Admitting: Physical Therapy

## 2023-03-31 DIAGNOSIS — R208 Other disturbances of skin sensation: Secondary | ICD-10-CM

## 2023-03-31 DIAGNOSIS — R278 Other lack of coordination: Secondary | ICD-10-CM | POA: Diagnosis not present

## 2023-03-31 DIAGNOSIS — R2689 Other abnormalities of gait and mobility: Secondary | ICD-10-CM | POA: Diagnosis not present

## 2023-03-31 DIAGNOSIS — M6281 Muscle weakness (generalized): Secondary | ICD-10-CM

## 2023-03-31 DIAGNOSIS — R2681 Unsteadiness on feet: Secondary | ICD-10-CM

## 2023-03-31 DIAGNOSIS — R41844 Frontal lobe and executive function deficit: Secondary | ICD-10-CM

## 2023-03-31 NOTE — Therapy (Signed)
OUTPATIENT PHYSICAL THERAPY NEURO TREATMENT   Patient Name: Selena Bender MRN: [id number] DOB:1951-07-12, 72 y.o., female Today's Date: 03/31/2023   PCP: Bethanie Dicker, NP REFERRING PROVIDER: Bethanie Dicker, NP   END OF SESSION:  PT End of Session - 03/31/23 1102     Visit Number 12    Number of Visits 17   with eval   Date for PT Re-Evaluation 04/28/23    Authorization Type Medicare    Progress Note Due on Visit 10    PT Start Time 1100    PT Stop Time 1145    PT Time Calculation (min) 45 min    Equipment Utilized During Treatment Gait belt    Activity Tolerance Patient tolerated treatment well    Behavior During Therapy WFL for tasks assessed/performed                    Past Medical History:  Diagnosis Date   Asthma    Breast cancer (HCC) 2014   Right   Chicken pox    CKD (chronic kidney disease), stage III (HCC)    COVID-19 virus infection 12/2020   Hypertension    Multiple sclerosis (HCC)    Seizures (HCC)    Past Surgical History:  Procedure Laterality Date   BREAST SURGERY     MASTECTOMY Right 2014   Patient Active Problem List   Diagnosis Date Noted   Spinal cord disease (HCC) 02/01/2023   Abnormal MRI, cervical spine (06/06/2020 & 11/13/2022) 02/01/2023   Demyelinating disease of central nervous system (HCC) 02/01/2023   Chronic radicular pain of lower extremity (S1) (Bilateral) 02/01/2023   Lower extremity burning pain 02/01/2023   Chronic low back pain (2ry area of Pain) (Bilateral) (R>L) w/ sciatica (Bilateral) 02/01/2023   Chronic hip pain (Bilateral) (L>R) 02/01/2023   Chronic upper extremity pain (intermittent) (Bilateral) 02/01/2023   Allergic rhinitis 01/14/2023   CKD (chronic kidney disease), stage III (HCC)    COVID-19 virus infection 12/2020   Myelopathy (HCC) 09/25/2020   History of right breast cancer 03/13/2020   Status post right mastectomy 03/13/2020   Abnormal gait 09/15/2018   Claustrophobia 09/15/2018    Hypertension 09/15/2018   Focal epilepsy (HCC) 09/15/2018   Multiple sclerosis (HCC) 09/15/2018   Paresthesia 09/15/2018   Chronic lower extremity pain (1ry area of Pain) (Bilateral) (R>L) 12/31/2015   Seizure (HCC) 12/31/2015   Neuropathic pain 06/02/2015   No advance directives 11/02/2012   Refusal of blood transfusion for reasons of conscience 11/02/2012   Malignant neoplasm of upper-outer quadrant of right breast in female, estrogen receptor positive (HCC) 11/01/2012    ONSET DATE: 01/14/2023 (referral date)  REFERRING DIAG: G35 (ICD-10-CM) - Multiple sclerosis (HCC) R26.9 (ICD-10-CM) - Abnormal gait M62.81 (ICD-10-CM) - Generalized muscle weakness  THERAPY DIAG:  Muscle weakness (generalized)  Other lack of coordination  Unsteadiness on feet  Other abnormalities of gait and mobility  Rationale for Evaluation and Treatment: Rehabilitation  SUBJECTIVE:  SUBJECTIVE STATEMENT: Pt reports ongoing tightness in her knee caps, feels like "leather" and "pulling", pain is pretty constant. Pt reports no falls since last visit.   Pt accompanied by: self and family member daughter [name] (outside)  PERTINENT HISTORY: relapsing remitting MS and breast CA and seizures  PAIN:  Are you having pain? Yes: NPRS scale: 4/10 Pain location: legs, hips down into thighs Pain description: pins and needles, burning sensation Aggravating factors: hard to tell just gets bad sometimes Relieving factors: pain medication, sometimes movement  Took pain medicine before she came today.  PRECAUTIONS: Fall  WEIGHT BEARING RESTRICTIONS: No  FALLS: Has patient fallen in last 6 months? No, several near falls though  LIVING ENVIRONMENT: Lives with: lives with their daughter [name] Lives in: House/apartment Stairs:  No Has following equipment at home: Counselling psychologist, Environmental consultant - 4 wheeled, and shower chair *needs new rollator and needs a BSC  PLOF: Independent with basic ADLs, Independent with gait, Independent with transfers, Requires assistive device for independence, and Needs assistance with homemaking  PATIENT GOALS: "to help me move around a little bit better and improve my balance, less pain"  OBJECTIVE:   DIAGNOSTIC FINDINGS:  B hip xrays 02/01/23: FINDINGS: SI joints are patent. Pubic symphysis and rami appear intact. Mild bilateral hip degenerative changes with minimal spurring and joint space narrowing   IMPRESSION: Mild bilateral hip degenerative changes.  Lumbar spine xray 02/01/23: FINDINGS: Five non rib-bearing lumbar type vertebra. Lumbar alignment within normal limits. Vertebral body heights are maintained. No suspicious change in alignment with flexion or extension. Mild disc space narrowing L2-L3 and L4-L5. Moderate facet degenerative changes worst at L4-L5 and L5-S1.   IMPRESSION: Multilevel degenerative changes as described above.  COGNITION: Overall cognitive status: Impaired "I'm not as sharp as I used to be"   SENSATION: Decreased light touch in R lateral knee  EDEMA:  Swelling in L knee most of the time, occasionally in L ankle  POSTURE: rounded shoulders, forward head, and posterior pelvic tilt  LOWER EXTREMITY ROM:     Passive  Right Eval Left Eval  Hip flexion   [facility]   [facility]  Hip extension    Hip abduction    Hip adduction    Hip internal rotation    Hip external rotation    Knee flexion    Knee extension    Ankle dorsiflexion    Ankle plantarflexion    Ankle inversion    Ankle eversion     (Blank rows = not tested)  LOWER EXTREMITY MMT:    MMT Right Eval Left Eval  Hip flexion 5 2-  Hip extension    Hip abduction    Hip adduction    Hip internal rotation    Hip external rotation    Knee flexion 5 2-  Knee extension 5 3   Ankle dorsiflexion 4 3  Ankle plantarflexion    Ankle inversion    Ankle eversion    (Blank rows = not tested)   TODAY'S TREATMENT:  Ther Ex Review of HEP: Supine bridges x 10 reps Supine marches x 10 reps  Utilizes towel around L thigh for AAROM Seated L ankle pumps x 10 reps Sit to stand x 5 reps from chair with arms, does need UE support for transfer Standing marches with countertop support x 10 reps B Standing B hip abd x 10 reps B Standing B hip ext x 10 reps B Focus on "soft" L knee and decreased shoulder elevation with standing exercises  Reprinted handout for patient for HEP  SciFit multi-peaks level 3 for 8 minutes using BUE/BLEs for neural priming for reciprocal movement, dynamic cardiovascular warmup and increased amplitude of stepping. RPE of 6/10 following activity.     PATIENT EDUCATION: Education details: Continue HEP Person educated: Patient Education method: Programmer, multimedia, Facilities manager, and Handouts Education comprehension: verbalized understanding, returned demonstration, and needs further education  HOME EXERCISE PROGRAM: From prior POC: Access Code: [email or web address] URL: [email or web address] Date: 02/15/2023 Prepared by: Peter Congo  Exercises - Sit to Stand Without Arm Support  - 1 x daily - 7 x weekly - 1 sets - 10 reps - Standing March with Counter Support  - 1 x daily - 7 x weekly - 1 sets - 10 reps - Standing Hip Abduction with Counter Support  - 1 x daily - 7 x weekly - 1 sets - 10 reps - Standing Hip Extension with Counter Support  - 1 x daily - 7 x weekly - 1 sets - 10 reps - Seated Ankle Pumps  - 1 x daily - 7 x weekly - 3 sets - 10 reps - Supine March  - 1 x daily - 7 x weekly - 3 sets - 5-10 reps - Supine Bridge  - 1 x daily - 7 x weekly - 1 sets - 10 reps    GOALS: Goals reviewed with patient?  Yes  SHORT TERM GOALS: Target date: 03/03/2023  Pt will be independent with initial HEP for improved strength, balance, transfers and gait. Baseline: Goal status:  MET  2.  Pt will improve 5 x STS to less than or equal to 18 seconds to demonstrate improved functional strength and transfer efficiency with BUE support as needed. Baseline: 21.15 sec with heavy UE reliance on arms of chair (6/27), 19.34 sec with BUE support on arms of chair (7/30) Goal status: IN PROGRESS  3.  Pt will improve normal TUG to less than or equal to 35 seconds for improved functional mobility and decreased fall risk with LRAD. Baseline: 40.37 sec with SBQC (6/27), 35 sec with SBQC (7/30) Goal status: MET  4.  Pt will improve gait velocity to at least 1.0 ft/sec for improved gait efficiency and performance at mod I level with LRAD. Baseline: 0.83 ft/sec with SBQC (6/27), 1.13 ft/sec with SBQC (7/30) Goal status: MET   LONG TERM GOALS: Target date: 04/07/2023   Pt will be independent with final HEP for improved strength, balance, transfers and gait. Baseline:  Goal status: INITIAL  2.  Pt will improve 5 x STS to less than or equal to 18 seconds to demonstrate improved functional strength and transfer efficiency with decreased BUE support.  Baseline: 21.15 sec with heavy UE reliance on arms of chair (6/27), 19.34 sec with BUE support on arms of chair (7/30) Goal status: REVISED  3.  Pt will improve normal TUG to less than or equal to 30 seconds for improved functional mobility and decreased fall risk with LRAD. Baseline: 40.37 sec with SBQC (6/27), 35 sec  with SBQC (7/30) Goal status: INITIAL  4.  Pt will improve gait velocity to at least 1.25 ft/sec for improved gait efficiency and performance at mod I level with LRAD Baseline: 0.83 ft/sec with SBQC (6/27), 1.13 ft/sec with SBQC (7/30) Goal status: INITIAL  5.  Pt will improve her score on the MSIS-29 to </= 95 points to demonstrate decreased disability  level. Baseline: 105/145 (6/27) Goal status: INITIAL    ASSESSMENT:  CLINICAL IMPRESSION: Emphasis of skilled PT session on reviewing HEP with patient to ensure correct performance of exercises and compliance with HEP at home. Pt with good understanding of how to perform exercises correctly. Provided new handout for patient. Pt continues to benefit from skilled therapy services to work towards LTGs. Continue POC.     OBJECTIVE IMPAIRMENTS: Abnormal gait, cardiopulmonary status limiting activity, decreased activity tolerance, decreased balance, decreased endurance, decreased mobility, difficulty walking, decreased strength, increased edema, impaired sensation, impaired UE functional use, and pain.   ACTIVITY LIMITATIONS: carrying, lifting, bending, standing, squatting, stairs, transfers, and bed mobility  PARTICIPATION LIMITATIONS: meal prep, cleaning, laundry, driving, and community activity  PERSONAL FACTORS: Age, Sex, Time since onset of injury/illness/exacerbation, Transportation, and 1-2 comorbidities: RRMS, cancer, seizures  are also affecting patient's functional outcome.   REHAB POTENTIAL: Fair time since onset of MS, previous bouts of therapy  CLINICAL DECISION MAKING: Stable/uncomplicated  EVALUATION COMPLEXITY: Moderate  PLAN:  PT FREQUENCY: 2x/week  PT DURATION: 8 weeks  PLANNED INTERVENTIONS: Therapeutic exercises, Therapeutic activity, Neuromuscular re-education, Balance training, Gait training, Patient/Family education, Self Care, Joint mobilization, Stair training, Vestibular training, Canalith repositioning, Visual/preceptual remediation/compensation, Orthotic/Fit training, DME instructions, Aquatic Therapy, Dry Needling, Electrical stimulation, Cryotherapy, Moist heat, Taping, Manual therapy, and Re-evaluation  PLAN FOR NEXT SESSION:  balance, endurance, gait training with rollator, (Pt's old genu recurvatum brace is in [place] desk), anterior weight shift with  sit to stands, squats? Practice w/rollator--did she reach out to Dr. Berline Chough?  Could also try estim or bioness in future sessions (let her know when so she can wear shorts)    Peter Congo, PT, DPT, [facility] [address] [place], [place]  [address] Phone:  [phone] Fax:  [phone]  03/31/2023, 11:47 AM

## 2023-03-31 NOTE — Therapy (Signed)
OUTPATIENT OCCUPATIONAL THERAPY NEURO TREATMENT  Patient Name: Selena Bender MRN: [id number] DOB:[birth year], 72 y.o., female Today's Date: 03/31/2023  PCP: Bethanie Dicker, NP REFERRING PROVIDER: Bethanie Dicker, NP  END OF SESSION:  OT End of Session - 03/31/23 1023     Visit Number 9    Number of Visits 12   + evaluation   Date for OT Re-Evaluation 04/15/23    Authorization Type Medicare and [employer] VL 60    Progress Note Due on Visit 10    OT Start Time 1023    OT Stop Time 1101    OT Time Calculation (min) 38 min    Activity Tolerance Patient tolerated treatment well    Behavior During Therapy WFL for tasks assessed/performed;Restless              Past Medical History:  Diagnosis Date   Asthma    Breast cancer (HCC) 2014   Right   Chicken pox    CKD (chronic kidney disease), stage III (HCC)    COVID-19 virus infection 12/2020   Hypertension    Multiple sclerosis (HCC)    Seizures (HCC)    Past Surgical History:  Procedure Laterality Date   BREAST SURGERY     MASTECTOMY Right 2014   Patient Active Problem List   Diagnosis Date Noted   Spinal cord disease (HCC) 02/01/2023   Abnormal MRI, cervical spine (06/06/2020 & 11/13/2022) 02/01/2023   Demyelinating disease of central nervous system (HCC) 02/01/2023   Chronic radicular pain of lower extremity (S1) (Bilateral) 02/01/2023   Lower extremity burning pain 02/01/2023   Chronic low back pain (2ry area of Pain) (Bilateral) (R>L) w/ sciatica (Bilateral) 02/01/2023   Chronic hip pain (Bilateral) (L>R) 02/01/2023   Chronic upper extremity pain (intermittent) (Bilateral) 02/01/2023   Allergic rhinitis 01/14/2023   CKD (chronic kidney disease), stage III (HCC)    COVID-19 virus infection 12/2020   Myelopathy (HCC) 09/25/2020   History of right breast cancer 03/13/2020   Status post right mastectomy 03/13/2020   Abnormal gait 09/15/2018   Claustrophobia 09/15/2018   Hypertension 09/15/2018   Focal epilepsy  (HCC) 09/15/2018   Multiple sclerosis (HCC) 09/15/2018   Paresthesia 09/15/2018   Chronic lower extremity pain (1ry area of Pain) (Bilateral) (R>L) 12/31/2015   Seizure (HCC) 12/31/2015   Neuropathic pain 06/02/2015   No advance directives 11/02/2012   Refusal of blood transfusion for reasons of conscience 11/02/2012   Malignant neoplasm of upper-outer quadrant of right breast in female, estrogen receptor positive (HCC) 11/01/2012    ONSET DATE: 02/04/2023  REFERRING DIAG: N82.956 (ICD-10-CM) - Left hand weakness G35 (ICD-10-CM) - Multiple sclerosis (HCC)  THERAPY DIAG:  Other lack of coordination  Other disturbances of skin sensation  Muscle weakness (generalized)  Frontal lobe and executive function deficit  Rationale for Evaluation and Treatment: Rehabilitation  SUBJECTIVE:   SUBJECTIVE STATEMENT: Pt reports that she is fine and that she has just learned to accept the nerve/pains that she gets.  Pt accompanied by: self   PERTINENT HISTORY:   Pt is a 72 yr old AA female with hx of relapsing remitting MS and breast CA with mastectomy as well as seizures, chronic pain in B legs/arms and a lot of nerve pain.  PRECAUTIONS: None  WEIGHT BEARING RESTRICTIONS: No  PAIN:  Are you having pain? Yes: NPRS scale: 7/10 Pain location: legs all the way to feet and arms all the way to her hands; legs >arms Pain description: nerve pains - pins and  needles Aggravating factors: hard to say - sometimes movement is fine but sometimes it makes her feel better Relieving factors: unknown  FALLS: Has patient fallen in last 6 months? No  LIVING ENVIRONMENT: Lives with: lives with their daughter Lives in: Apartment Stairs: No Has following equipment at home: Counselling psychologist, Environmental consultant - 4 wheeled, shower chair, Grab bars, and has grab bar that needs to be installed as the suction grab bar came off the wall during a shower a few months ago - also may need elevated BSC  PLOF:  Independent with basic ADLs, Independent with household mobility with device, and Needs assistance with homemaking  PATIENT GOALS: Use my hands better.  OBJECTIVE:   HAND DOMINANCE: Right  ADLs: Overall ADLs: Pt generally MI Transfers/ambulation related to ADLs: MI with AE Eating: I Grooming: Gets hair/nails done professionally UB Dressing: Sits down to get dressed LB Dressing: Able to do it with a little bit of a struggle but "I can still do it." Toileting: MI Bathing: Showers 2x/week Tub Shower transfers: Supervision - needs grab bar replaced Equipment: Shower seat with back, Walk in shower, and Reacher  IADLs: Shopping: I go shopping sometimes (does not drive). Light housekeeping: I do my own laundry Meal Prep: Daughter does most of it. Every now and then I get in the kitchen. Community mobility: Uses cane in public and walker at home (needs a new rollator due to poor brakes etc) - may need rx for new walker Medication management: Self managed with pill bottles - only uses a pill box only when she goes away/vacation Financial management: Self managed Handwriting: 50% legible   MOBILITY STATUS: Needs Assist: uses AE - rollator at home or cane in public  POSTURE COMMENTS:  No Significant postural limitations Sitting balance: WFL  ACTIVITY TOLERANCE: Activity tolerance: Patient reports only doing 1 thing/day ie) shopping, appt or getting nails done.  Needs to take breaks ie) when doing her hair.  FUNCTIONAL OUTCOME MEASURES: Quick Dash: 34.1 % disability   UPPER EXTREMITY ROM:    Active ROM Right eval Left eval  Shoulder flexion [facility]   Shoulder abduction    Shoulder adduction    Shoulder extension    Shoulder internal rotation    Shoulder external rotation    Elbow flexion    Elbow extension  Decreased end range  Wrist flexion    Wrist extension    Wrist ulnar deviation    Wrist radial deviation    Wrist pronation    Wrist supination    (Blank rows = not  tested)  UPPER EXTREMITY MMT:     MMT Right eval Left eval  Shoulder flexion 4/5 3/5  Shoulder abduction    Shoulder adduction    Shoulder extension    Shoulder internal rotation    Shoulder external rotation    Middle trapezius    Lower trapezius    Elbow flexion    Elbow extension    Wrist flexion    Wrist extension    Wrist ulnar deviation    Wrist radial deviation    Wrist pronation    Wrist supination    (Blank rows = not tested)  HAND FUNCTION: Grip strength: Right: 33.2, 44.9, 35.0  lbs; Left: 19.8, 23.5, 21.8 lbs Evaluation Average: Right 37.7 lbs Left 21.7 lbs  COORDINATION: 9 Hole Peg test: Right: 47.93 sec; Left: 2:00:03 sec Box and Blocks:  Right 22 blocks, Left 18 blocks  SENSATION: Light touch: Impaired  Patient reports R side  intact L palmar surface a bit dull and tingly in comparison.  EDEMA: NS  MUSCLE TONE:   COGNITION: Overall cognitive status:  Patient does report some changes in memory  VISION: Subjective report: L eye wanders and doesn't focus (does not like to look in the mirror due to changes which are also noted and pointed out by other family members) Baseline vision: Bifocals Visual history: NA  VISION ASSESSMENT: Impaired Tracking/Visual pursuits: Decreased smoothness with horizontal tracking  Patient has difficulty with following activities due to following visual impairments: eye wanders  PERCEPTION: Not tested  PRAXIS: Not tested  Evaluation OBSERVATIONS: Patient is a well groomed lady who regularly has her nails manicured.  She has L pinkie finger often held in a tight hook position often with difficulty extending it actively.  Awkward positioning noted with pegboard test with L arm ie) alternating between turning wrist pronated/supinated at times.    TODAY'S TREATMENT:                                                                                                                               - Therapeutic activities  completed for duration as noted below including:  Reviewed idea of a memory binder for compiling information such as the exercises provided in therapy, medications/medical info and resources as provided today.  Patient had been looking for a folder and was encouraged to consider a binder with handout provided for sheet protectors to hold her info (rather than needing to have a hole punch).  Provided another sheet protector with a resource provided for jar opener (manual and electric).    Practiced modified jar opening with additional grip (shelf liner) around the container or lid with good success opening various containers today with min assist.  She even opened a glass jar with snug lid  Utilizing rice bin and bag of different objects, pt completed stereognosis challenge with affected LUE to improve sensory perception, item discrimination, and in hand manipulation. Pt completed challenge with mild errors. Pt completed same challenges with unaffected extremity for additional feedback.     PATIENT EDUCATION: Education details: Animal nutritionist   Person educated: Patient Education method: Explanation, Demonstration, and Verbal cues Education comprehension: verbalized understanding, verbal cues required, and needs further education  HOME EXERCISE PROGRAM: 03/01/23: Coordination activities with images provided 03/08/23: Diplopia HEP 03/10/23: Therapy Putty Exercises - Access Code: [email or web address] 03/15/23: Wrist and Digital extension - Access Code: [email or web address] 03/22/2023: Pencil pushes and back and forth vision HEP  GOALS: Goals reviewed with patient? Yes  SHORT TERM GOALS: Target date: 03/17/2023    Patient will demonstrate BUE HEP (ROM, strength & coordination) with 25% verbal cues or less for proper execution. Baseline: not yet initiated Goal status: MET  2.  Pt will verbalize understanding of adapted strategies/equipment to maximize safety and independence with ADLs/IADLs. Baseline: not yet  initiated Goal status: IN PROGRESS  3.  Patient will demonstrate at least 40 lbs R grip and  25 lbs L grip strength as needed to open jars and other containers.  Baseline: Evaluation Average: Right 37.7 lbs Left 21.7 lbs  Goal statues: IN PROGRESS 03/22/2023: Right 57.5 lbs Left 21.6 lbs (met on R)  4. Patient will be aware of sensory stimulation and compensatory strategies for diminished sensation.  Baseline: Patient reports R side intact L palmar surface a bit dull and tingly in comparison.  Goal Status: IN PROGRESS  5.  Pt will be able to place at least 5 more blocks with each hand with completion of Box and Blocks test. Baseline: Box and Blocks:  Right 22 blocks, Left 18 blocks Goal status: IN PROGRESS 03/22/2023: Right 22 blocks, Left 18 blocks  6.  Pt will demonstrate use of binder for information organization of HEPs, MS resources and medical records. Baseline: not yet initiated - daughter present at eval for recommendation Goal status: IN PROGRESS  LONG TERM GOALS: Target date: 04/08/2023    Pt will verbalize understanding (or locate information in info binder) re: ways to prevent future MS related complications and MS community resources. Baseline: not yet initiated Goal status: IN Progress  2.  Pt will verbalize understanding of ways to keep thinking skills sharp and ways to compensate for STM changes in the future. Baseline: not yet initiated Goal status: IN progress  3.  Pt will demonstrate improved fine motor coordination for ADLs as evidenced by decreasing 9 hole peg test score for BUE by 10-15 secs. Baseline: 9 Hole Peg test: Right: 47.93 sec; Left: 2:00:03 sec Goal status: IN PROGRESS 03/22/2023: Right: 46.26 sec; Left: 141 sec  4.  Pt will demonstrate improved positioning of L pinkie finger through proper day and/or nighttime splinting as deemed effective, comfortable and appropriate. Baseline: poor active extension of PIP joint Goal status: IN PROGRESS  5.   Patient will demonstrate at least 16% improvement with quick Dash score (reporting 18% disability or less) indicating improved functional use of affected extremity. Baseline: 34% disability Goal status: INITIAL      ASSESSMENT:  CLINICAL IMPRESSION:  Patient is a 72 y.o. female who was seen today for occupational therapy treatment for multiple areas due to MS.  Pt has been demonstrating positive response to various activities presented for ROM, strength, coordination and sensory awareness with benefit of memory binder emphasized again today to organize her materials.  Pt and family will benefit from continued skilled OT services in the outpatient setting to work on impairments and ensure max independence with HEP with reinforcement of FM and vision exercises to help with efforts to progress towards goals.   PERFORMANCE DEFICITS: in functional skills including ADLs, coordination, dexterity, sensation, edema, ROM, strength, pain, flexibility, Fine motor control, Gross motor control, mobility, balance, endurance, decreased knowledge of use of DME, and UE functional use, cognitive skills including attention and memory, and psychosocial skills including coping strategies and routines and behaviors.   IMPAIRMENTS: are limiting patient from ADLs, rest and sleep, leisure, and social participation.   CO-MORBIDITIES: may have co-morbidities  that affects occupational performance. Patient will benefit from skilled OT to address above impairments and improve overall function.  REHAB POTENTIAL: Good  PLAN:  OT FREQUENCY: 1-2x/week  OT DURATION: 6 weeks  PLANNED INTERVENTIONS: self care/ADL training, therapeutic exercise, therapeutic activity, neuromuscular re-education, balance training, functional mobility training, splinting, patient/family education, cognitive remediation/compensation, visual/perceptual remediation/compensation, energy conservation, coping strategies training, and DME and/or AE  instructions  RECOMMENDED OTHER SERVICES: May need prescriptions for [facility] and new rollator.  Also may  need eye exam.  CONSULTED AND AGREED WITH PLAN OF CARE: Patient and family member/caregiver  PLAN FOR NEXT SESSIONS:   Continue with MS Education BINDER resources ie) handouts for AE, exercises etc from HEPs already, sensory stimulation and compensatory strategies .memorytips Handout  Sent request for BSC/rollator replacement. Any updates? - Pt made a noted to discuss at upcoming MD appt  Review HEP for UE ROM, coordination, strength (putty intro'd 8/1)    sensory stimulation/compensatory strategies (will need handouts)  Review needs for goal for adapted strategies/equipment to maximize safety and independence with ADLs/IADLs.  Victorino Sparrow, OT 03/31/2023, 12:06 PM

## 2023-04-04 ENCOUNTER — Telehealth: Payer: Self-pay | Admitting: *Deleted

## 2023-04-04 NOTE — Telephone Encounter (Signed)
Patient requesting RX for a rollator, bedside commode and a riser for her toilet.  These items were request from physical therapist.

## 2023-04-04 NOTE — Telephone Encounter (Signed)
RX faxed to [facility]

## 2023-04-05 ENCOUNTER — Ambulatory Visit: Payer: Medicare Other | Admitting: Physical Therapy

## 2023-04-05 ENCOUNTER — Ambulatory Visit: Payer: Medicare Other | Admitting: Occupational Therapy

## 2023-04-05 DIAGNOSIS — M6281 Muscle weakness (generalized): Secondary | ICD-10-CM

## 2023-04-05 DIAGNOSIS — R278 Other lack of coordination: Secondary | ICD-10-CM

## 2023-04-05 DIAGNOSIS — R41844 Frontal lobe and executive function deficit: Secondary | ICD-10-CM | POA: Diagnosis not present

## 2023-04-05 DIAGNOSIS — R2681 Unsteadiness on feet: Secondary | ICD-10-CM

## 2023-04-05 DIAGNOSIS — R2689 Other abnormalities of gait and mobility: Secondary | ICD-10-CM

## 2023-04-05 DIAGNOSIS — R208 Other disturbances of skin sensation: Secondary | ICD-10-CM

## 2023-04-05 NOTE — Therapy (Signed)
OUTPATIENT PHYSICAL THERAPY NEURO TREATMENT   Patient Name: Selena Bender MRN: [id number] DOB:05/30/1951, 72 y.o., female Today's Date: 04/05/2023   PCP: Bethanie Dicker, NP REFERRING PROVIDER: Bethanie Dicker, NP   END OF SESSION:  PT End of Session - 04/05/23 1107     Visit Number 13    Number of Visits 17   with eval   Date for PT Re-Evaluation 04/28/23    Authorization Type Medicare    Progress Note Due on Visit 10    PT Start Time 1105    PT Stop Time 1145    PT Time Calculation (min) 40 min    Equipment Utilized During Treatment Gait belt    Activity Tolerance Patient tolerated treatment well    Behavior During Therapy WFL for tasks assessed/performed                     Past Medical History:  Diagnosis Date   Asthma    Breast cancer (HCC) 2014   Right   Chicken pox    CKD (chronic kidney disease), stage III (HCC)    COVID-19 virus infection 12/2020   Hypertension    Multiple sclerosis (HCC)    Seizures (HCC)    Past Surgical History:  Procedure Laterality Date   BREAST SURGERY     MASTECTOMY Right 2014   Patient Active Problem List   Diagnosis Date Noted   Spinal cord disease (HCC) 02/01/2023   Abnormal MRI, cervical spine (06/06/2020 & 11/13/2022) 02/01/2023   Demyelinating disease of central nervous system (HCC) 02/01/2023   Chronic radicular pain of lower extremity (S1) (Bilateral) 02/01/2023   Lower extremity burning pain 02/01/2023   Chronic low back pain (2ry area of Pain) (Bilateral) (R>L) w/ sciatica (Bilateral) 02/01/2023   Chronic hip pain (Bilateral) (L>R) 02/01/2023   Chronic upper extremity pain (intermittent) (Bilateral) 02/01/2023   Allergic rhinitis 01/14/2023   CKD (chronic kidney disease), stage III (HCC)    COVID-19 virus infection 12/2020   Myelopathy (HCC) 09/25/2020   History of right breast cancer 03/13/2020   Status post right mastectomy 03/13/2020   Abnormal gait 09/15/2018   Claustrophobia 09/15/2018    Hypertension 09/15/2018   Focal epilepsy (HCC) 09/15/2018   Multiple sclerosis (HCC) 09/15/2018   Paresthesia 09/15/2018   Chronic lower extremity pain (1ry area of Pain) (Bilateral) (R>L) 12/31/2015   Seizure (HCC) 12/31/2015   Neuropathic pain 06/02/2015   No advance directives 11/02/2012   Refusal of blood transfusion for reasons of conscience 11/02/2012   Malignant neoplasm of upper-outer quadrant of right breast in female, estrogen receptor positive (HCC) 11/01/2012    ONSET DATE: 01/14/2023 (referral date)  REFERRING DIAG: G35 (ICD-10-CM) - Multiple sclerosis (HCC) R26.9 (ICD-10-CM) - Abnormal gait M62.81 (ICD-10-CM) - Generalized muscle weakness  THERAPY DIAG:  Other lack of coordination  Muscle weakness (generalized)  Unsteadiness on feet  Other abnormalities of gait and mobility  Rationale for Evaluation and Treatment: Rehabilitation  SUBJECTIVE:  SUBJECTIVE STATEMENT: Pt reports she did reach out to Dr. Berline Chough for the rollator order. States the office called her yesterday and are working with insurance to figure out what will be covered. No falls. Pt requesting to go on pause from PT and OT after this week to focus on her other appointments and get her transportation set up.   Pt accompanied by: self and family member daughter [name] (outside)  PERTINENT HISTORY: relapsing remitting MS and breast CA and seizures  PAIN:  Are you having pain? Yes: NPRS scale: 4-5/10 Pain location: legs, hips down into thighs Pain description: pins and needles, burning sensation Aggravating factors: hard to tell just gets bad sometimes Relieving factors: pain medication, sometimes movement  Took pain medicine before she came today.  PRECAUTIONS: Fall  WEIGHT BEARING RESTRICTIONS: No  FALLS: Has  patient fallen in last 6 months? No, several near falls though  LIVING ENVIRONMENT: Lives with: lives with their daughter [name] Lives in: House/apartment Stairs: No Has following equipment at home: Counselling psychologist, Environmental consultant - 4 wheeled, and shower chair *needs new rollator and needs a BSC  PLOF: Independent with basic ADLs, Independent with gait, Independent with transfers, Requires assistive device for independence, and Needs assistance with homemaking  PATIENT GOALS: "to help me move around a little bit better and improve my balance, less pain"  OBJECTIVE:   DIAGNOSTIC FINDINGS:  B hip xrays 02/01/23: FINDINGS: SI joints are patent. Pubic symphysis and rami appear intact. Mild bilateral hip degenerative changes with minimal spurring and joint space narrowing   IMPRESSION: Mild bilateral hip degenerative changes.  Lumbar spine xray 02/01/23: FINDINGS: Five non rib-bearing lumbar type vertebra. Lumbar alignment within normal limits. Vertebral body heights are maintained. No suspicious change in alignment with flexion or extension. Mild disc space narrowing L2-L3 and L4-L5. Moderate facet degenerative changes worst at L4-L5 and L5-S1.   IMPRESSION: Multilevel degenerative changes as described above.  COGNITION: Overall cognitive status: Impaired "I'm not as sharp as I used to be"   SENSATION: Decreased light touch in R lateral knee  EDEMA:  Swelling in L knee most of the time, occasionally in L ankle  POSTURE: rounded shoulders, forward head, and posterior pelvic tilt  LOWER EXTREMITY ROM:     Passive  Right Eval Left Eval  Hip flexion   [facility]   [facility]  Hip extension    Hip abduction    Hip adduction    Hip internal rotation    Hip external rotation    Knee flexion    Knee extension    Ankle dorsiflexion    Ankle plantarflexion    Ankle inversion    Ankle eversion     (Blank rows = not tested)  LOWER EXTREMITY MMT:    MMT Right Eval Left Eval  Hip  flexion 5 2-  Hip extension    Hip abduction    Hip adduction    Hip internal rotation    Hip external rotation    Knee flexion 5 2-  Knee extension 5 3  Ankle dorsiflexion 4 3  Ankle plantarflexion    Ankle inversion    Ankle eversion    (Blank rows = not tested)   TODAY'S TREATMENT:  Ther Ex SciFit multi-peaks level 5.5 for 8 minutes using BUE/BLEs for neural priming for reciprocal movement, dynamic cardiovascular warmup and increased amplitude of stepping.   NMR  Attempted TKE w/blue resistance band around L knee at mat table w/UE support on cane for improved , but pt too unstable without BUE support. Performed x10 reps w/therapist providing min guard anterior to pt.  At ballet bar, mini squats w/BUE support x15 reps for improved BLE strength and facilitation of L knee flexion. Min cues to place feet evenly on ground (pt placing LLE anterior to RLE) and to maintain weight on heels.  At ballet bar, alt step up w/contralateral march to 6" step w/BUE support for improved single leg stability, hip strength and facilitation of L knee flexion. Pt performed very well bilaterally, w/no instance of circumduction of LLE w/activity.     PATIENT EDUCATION: Education details: Continue HEP, Discussed POC moving forward and pt requesting to go on pause from PT after next visit for a month so she can work on setting up transportation and her other medical appointments.  Person educated: Patient Education method: Explanation, Demonstration, and Handouts Education comprehension: verbalized understanding, returned demonstration, and needs further education  HOME EXERCISE PROGRAM: From prior POC: Access Code: [email or web address] URL: [email or web address] Date: 02/15/2023 Prepared by: Peter Congo  Exercises - Sit to Stand Without Arm Support  - 1 x daily - 7 x  weekly - 1 sets - 10 reps - Standing March with Counter Support  - 1 x daily - 7 x weekly - 1 sets - 10 reps - Standing Hip Abduction with Counter Support  - 1 x daily - 7 x weekly - 1 sets - 10 reps - Standing Hip Extension with Counter Support  - 1 x daily - 7 x weekly - 1 sets - 10 reps - Seated Ankle Pumps  - 1 x daily - 7 x weekly - 3 sets - 10 reps - Supine March  - 1 x daily - 7 x weekly - 3 sets - 5-10 reps - Supine Bridge  - 1 x daily - 7 x weekly - 1 sets - 10 reps    GOALS: Goals reviewed with patient? Yes  SHORT TERM GOALS: Target date: 03/03/2023  Pt will be independent with initial HEP for improved strength, balance, transfers and gait. Baseline: Goal status:  MET  2.  Pt will improve 5 x STS to less than or equal to 18 seconds to demonstrate improved functional strength and transfer efficiency with BUE support as needed. Baseline: 21.15 sec with heavy UE reliance on arms of chair (6/27), 19.34 sec with BUE support on arms of chair (7/30) Goal status: IN PROGRESS  3.  Pt will improve normal TUG to less than or equal to 35 seconds for improved functional mobility and decreased fall risk with LRAD. Baseline: 40.37 sec with SBQC (6/27), 35 sec with SBQC (7/30) Goal status: MET  4.  Pt will improve gait velocity to at least 1.0 ft/sec for improved gait efficiency and performance at mod I level with LRAD. Baseline: 0.83 ft/sec with SBQC (6/27), 1.13 ft/sec with SBQC (7/30) Goal status: MET   LONG TERM GOALS: Target date: 04/07/2023   Pt will be independent with final HEP for improved strength, balance, transfers and gait. Baseline:  Goal status: INITIAL  2.  Pt will improve 5 x STS to less than or equal to 18 seconds to demonstrate improved functional strength and transfer efficiency with decreased BUE support.  Baseline: 21.15  sec with heavy UE reliance on arms of chair (6/27), 19.34 sec with BUE support on arms of chair (7/30) Goal status: REVISED  3.  Pt will  improve normal TUG to less than or equal to 30 seconds for improved functional mobility and decreased fall risk with LRAD. Baseline: 40.37 sec with SBQC (6/27), 35 sec with SBQC (7/30) Goal status: INITIAL  4.  Pt will improve gait velocity to at least 1.25 ft/sec for improved gait efficiency and performance at mod I level with LRAD Baseline: 0.83 ft/sec with SBQC (6/27), 1.13 ft/sec with SBQC (7/30) Goal status: INITIAL  5.  Pt will improve her score on the MSIS-29 to </= 95 points to demonstrate decreased disability level. Baseline: 105/145 (6/27) Goal status: INITIAL    ASSESSMENT:  CLINICAL IMPRESSION: Emphasis of skilled PT session on endurance, facilitation of L knee flexion and improved BLE strength. Pt continues to report tightness in bilateral patellas, but pt's tone in L quad reduced this date. Pt requesting to go on pause from PT following next session, so informed pt that if she does not return by 30 days we will need a new referral. Pt verbalized understanding. Pt able to perform step ups w/LLE without circumduction compensation this date for first time, indicative of improved BLE strength. Continue POC.     OBJECTIVE IMPAIRMENTS: Abnormal gait, cardiopulmonary status limiting activity, decreased activity tolerance, decreased balance, decreased endurance, decreased mobility, difficulty walking, decreased strength, increased edema, impaired sensation, impaired UE functional use, and pain.   ACTIVITY LIMITATIONS: carrying, lifting, bending, standing, squatting, stairs, transfers, and bed mobility  PARTICIPATION LIMITATIONS: meal prep, cleaning, laundry, driving, and community activity  PERSONAL FACTORS: Age, Sex, Time since onset of injury/illness/exacerbation, Transportation, and 1-2 comorbidities: RRMS, cancer, seizures  are also affecting patient's functional outcome.   REHAB POTENTIAL: Fair time since onset of MS, previous bouts of therapy  CLINICAL DECISION MAKING:  Stable/uncomplicated  EVALUATION COMPLEXITY: Moderate  PLAN:  PT FREQUENCY: 2x/week  PT DURATION: 8 weeks  PLANNED INTERVENTIONS: Therapeutic exercises, Therapeutic activity, Neuromuscular re-education, Balance training, Gait training, Patient/Family education, Self Care, Joint mobilization, Stair training, Vestibular training, Canalith repositioning, Visual/preceptual remediation/compensation, Orthotic/Fit training, DME instructions, Aquatic Therapy, Dry Needling, Electrical stimulation, Cryotherapy, Moist heat, Taping, Manual therapy, and Re-evaluation  PLAN FOR NEXT SESSION:  Check goals. Pt to go on pause after this session. balance, endurance, gait training with rollator, (Pt's old genu recurvatum brace is in [place] desk), anterior weight shift with sit to stands, squats? Practice w/rollator  Could also try estim or bioness in future sessions (let her know when so she can wear shorts)    Jill Alexanders Nichoel Digiulio, PT, DPT [facility] [address] [place], [place]  [address] Phone:  [phone] Fax:  [phone]  04/05/2023, 11:59 AM

## 2023-04-05 NOTE — Therapy (Addendum)
OUTPATIENT OCCUPATIONAL THERAPY NEURO TREATMENT & PROGRESS NOTE  Patient Name: Selena Bender MRN: [id number] DOB:10/02/1950, 72 y.o., female Today's Date: 04/05/2023  PCP: Bethanie Dicker, NP REFERRING PROVIDER: Bethanie Dicker, NP  END OF SESSION:  OT End of Session - 04/05/23 1027     Visit Number 10    Number of Visits 12   + evaluation   Date for OT Re-Evaluation 04/15/23    Authorization Type Medicare and [employer] VL 60    Progress Note Due on Visit 10    OT Start Time 1027    OT Stop Time 1101    OT Time Calculation (min) 34 min    Activity Tolerance Patient tolerated treatment well    Behavior During Therapy WFL for tasks assessed/performed;Restless              Past Medical History:  Diagnosis Date   Asthma    Breast cancer (HCC) 2014   Right   Chicken pox    CKD (chronic kidney disease), stage III (HCC)    COVID-19 virus infection 12/2020   Hypertension    Multiple sclerosis (HCC)    Seizures (HCC)    Past Surgical History:  Procedure Laterality Date   BREAST SURGERY     MASTECTOMY Right 2014   Patient Active Problem List   Diagnosis Date Noted   Spinal cord disease (HCC) 02/01/2023   Abnormal MRI, cervical spine (06/06/2020 & 11/13/2022) 02/01/2023   Demyelinating disease of central nervous system (HCC) 02/01/2023   Chronic radicular pain of lower extremity (S1) (Bilateral) 02/01/2023   Lower extremity burning pain 02/01/2023   Chronic low back pain (2ry area of Pain) (Bilateral) (R>L) w/ sciatica (Bilateral) 02/01/2023   Chronic hip pain (Bilateral) (L>R) 02/01/2023   Chronic upper extremity pain (intermittent) (Bilateral) 02/01/2023   Allergic rhinitis 01/14/2023   CKD (chronic kidney disease), stage III (HCC)    COVID-19 virus infection 12/2020   Myelopathy (HCC) 09/25/2020   History of right breast cancer 03/13/2020   Status post right mastectomy 03/13/2020   Abnormal gait 09/15/2018   Claustrophobia 09/15/2018   Hypertension 09/15/2018    Focal epilepsy (HCC) 09/15/2018   Multiple sclerosis (HCC) 09/15/2018   Paresthesia 09/15/2018   Chronic lower extremity pain (1ry area of Pain) (Bilateral) (R>L) 12/31/2015   Seizure (HCC) 12/31/2015   Neuropathic pain 06/02/2015   No advance directives 11/02/2012   Refusal of blood transfusion for reasons of conscience 11/02/2012   Malignant neoplasm of upper-outer quadrant of right breast in female, estrogen receptor positive (HCC) 11/01/2012    ONSET DATE: 02/04/2023  REFERRING DIAG: W09.811 (ICD-10-CM) - Left hand weakness G35 (ICD-10-CM) - Multiple sclerosis (HCC)  THERAPY DIAG:  Other lack of coordination  Muscle weakness (generalized)  Other disturbances of skin sensation  Rationale for Evaluation and Treatment: Rehabilitation  SUBJECTIVE:   SUBJECTIVE STATEMENT: Pt reports that she is fine today with her regular but intermittent nerve pains.  Pt accompanied by: self   PERTINENT HISTORY:   Pt is a 72 yr old AA female with hx of relapsing remitting MS and breast CA with mastectomy as well as seizures, chronic pain in B legs/arms and a lot of nerve pain.  PRECAUTIONS: None  WEIGHT BEARING RESTRICTIONS: No  PAIN:  Are you having pain? Yes: NPRS scale: 7/10 Pain location: legs all the way to feet and arms all the way to her hands; legs >arms Pain description: nerve pains - pins and needles Aggravating factors: hard to say - sometimes movement  is fine but sometimes it makes her feel better Relieving factors: unknown  FALLS: Has patient fallen in last 6 months? No  LIVING ENVIRONMENT: Lives with: lives with their daughter Lives in: Apartment Stairs: No Has following equipment at home: Counselling psychologist, Environmental consultant - 4 wheeled, shower chair, Grab bars, and has grab bar that needs to be installed as the suction grab bar came off the wall during a shower a few months ago - also may need elevated BSC  PLOF: Independent with basic ADLs, Independent with household  mobility with device, and Needs assistance with homemaking  PATIENT GOALS: Use my hands better.  OBJECTIVE:   HAND DOMINANCE: Right  ADLs: Overall ADLs: Pt generally MI Transfers/ambulation related to ADLs: MI with AE Eating: I Grooming: Gets hair/nails done professionally UB Dressing: Sits down to get dressed LB Dressing: Able to do it with a little bit of a struggle but "I can still do it." Toileting: MI Bathing: Showers 2x/week Tub Shower transfers: Supervision - needs grab bar replaced Equipment: Shower seat with back, Walk in shower, and Reacher  IADLs: Shopping: I go shopping sometimes (does not drive). Light housekeeping: I do my own laundry Meal Prep: Daughter does most of it. Every now and then I get in the kitchen. Community mobility: Uses cane in public and walker at home (needs a new rollator due to poor brakes etc) - may need rx for new walker Medication management: Self managed with pill bottles - only uses a pill box only when she goes away/vacation Financial management: Self managed Handwriting: 50% legible   MOBILITY STATUS: Needs Assist: uses AE - rollator at home or cane in public  POSTURE COMMENTS:  No Significant postural limitations Sitting balance: WFL  ACTIVITY TOLERANCE: Activity tolerance: Patient reports only doing 1 thing/day ie) shopping, appt or getting nails done.  Needs to take breaks ie) when doing her hair.  FUNCTIONAL OUTCOME MEASURES: Quick Dash: 34.1 % disability   UPPER EXTREMITY ROM:    Active ROM Right eval Left eval  Shoulder flexion [facility]   Shoulder abduction    Shoulder adduction    Shoulder extension    Shoulder internal rotation    Shoulder external rotation    Elbow flexion    Elbow extension  Decreased end range  Wrist flexion    Wrist extension    Wrist ulnar deviation    Wrist radial deviation    Wrist pronation    Wrist supination    (Blank rows = not tested)  UPPER EXTREMITY MMT:     MMT Right eval  Left eval  Shoulder flexion 4/5 3/5  Shoulder abduction    Shoulder adduction    Shoulder extension    Shoulder internal rotation    Shoulder external rotation    Middle trapezius    Lower trapezius    Elbow flexion    Elbow extension    Wrist flexion    Wrist extension    Wrist ulnar deviation    Wrist radial deviation    Wrist pronation    Wrist supination    (Blank rows = not tested)  HAND FUNCTION: Grip strength: Right: 33.2, 44.9, 35.0  lbs; Left: 19.8, 23.5, 21.8 lbs Evaluation Average: Right 37.7 lbs Left 21.7 lbs  COORDINATION: 9 Hole Peg test: Right: 47.93 sec; Left: 2:00:03 sec Box and Blocks:  Right 22 blocks, Left 18 blocks  SENSATION: Light touch: Impaired  Patient reports R side intact L palmar surface a bit dull and tingly  in comparison.  EDEMA: NS  MUSCLE TONE:   COGNITION: Overall cognitive status:  Patient does report some changes in memory  VISION: Subjective report: L eye wanders and doesn't focus (does not like to look in the mirror due to changes which are also noted and pointed out by other family members) Baseline vision: Bifocals Visual history: NA  VISION ASSESSMENT: Impaired Tracking/Visual pursuits: Decreased smoothness with horizontal tracking  Patient has difficulty with following activities due to following visual impairments: eye wanders  PERCEPTION: Not tested  PRAXIS: Not tested  Evaluation OBSERVATIONS: Patient is a well groomed lady who regularly has her nails manicured.  She has L pinkie finger often held in a tight hook position often with difficulty extending it actively.  Awkward positioning noted with pegboard test with L arm ie) alternating between turning wrist pronated/supinated at times.    TODAY'S TREATMENT:                                                                                                                               - Therapeutic activities completed for duration as noted below  including:  Continued to review ideas for memory binder for compiling information such as the exercises, AE ideas and personal information ie) medications/medical info and resources as provided today.     Patient worked on fine motor tasks today with education on adaptive equipment to help with ADLs as well as strategic performance of tasks for maximum success with fine motor tasks.  This was significantly improved with nine-hole peg test.  First attempts to take patient over 2 minutes to perform however with strategic sequence of activity and grasp of page she is able to perform the task in 80 seconds with left hand which is her least coordinated side.  Patient engaged in practicing fastening buttons with simulated button hook i.e. large open paperclip with good success 2/3 trials.  Also demonstrated and located other adaptive equipment online for her review including fingernail clippers with patient encouraged to identify other activities she may need assistance with for problem solving with therapy staff.  PATIENT EDUCATION: Education details: Animal nutritionist   Person educated: Patient Education method: Explanation, Demonstration, and Verbal cues Education comprehension: verbalized understanding, verbal cues required, and needs further education  HOME EXERCISE PROGRAM: 03/01/23: Coordination activities with images provided 03/08/23: Diplopia HEP 03/10/23: Therapy Putty Exercises - Access Code: [email or web address] 03/15/23: Wrist and Digital extension - Access Code: [email or web address] 03/22/2023: Pencil pushes and back and forth vision HEP  GOALS: Goals reviewed with patient? Yes  SHORT TERM GOALS: Target date: 03/17/2023    Patient will demonstrate BUE HEP (ROM, strength & coordination) with 25% verbal cues or less for proper execution. Baseline: not yet initiated Goal status: MET  2.  Pt will verbalize understanding of adapted strategies/equipment to maximize safety and independence with ADLs/IADLs. Baseline:  not yet initiated Goal status: MET 04/04/23 - spoke with doctor re prescriptions/insurance coverage for [facility] and rollator, reviewed  button hook, 1 handed nail clippers etc  3.  Patient will demonstrate at least 40 lbs R grip and 25 lbs L grip strength as needed to open jars and other containers.  Baseline: Evaluation Average: Right 37.7 lbs Left 21.7 lbs  Goal statues: IN PROGRESS 03/22/2023: Right 57.5 lbs Left 21.6 lbs (met on R)   4. Patient will be aware of sensory stimulation and compensatory strategies for diminished sensation.  Baseline: Patient reports R side intact L palmar surface a bit dull and tingly in comparison.  Goal Status: IN PROGRESS 04/05/23 Ongoing occasional sharp shooting pains/pins & needles that last a few minutes in L hand  5.  Pt will be able to place at least 5 more blocks with each hand with completion of Box and Blocks test. Baseline: Box and Blocks:  Right 22 blocks, Left 18 blocks Goal status: IN PROGRESS 03/22/2023: Right 22 blocks, Left 18 blocks  6.  Pt will demonstrate use of binder for information organization of HEPs, MS resources and medical records. Baseline: not yet initiated - daughter present at eval for recommendation Goal status: IN PROGRESS  LONG TERM GOALS: Target date: 04/08/2023    Pt will verbalize understanding (or locate information in info binder) re: ways to prevent future MS related complications and MS community resources. Baseline: not yet initiated Goal status: IN Progress  2.  Pt will verbalize understanding of ways to keep thinking skills sharp and ways to compensate for STM changes in the future. Baseline: not yet initiated Goal status: IN progress  3.  Pt will demonstrate improved fine motor coordination for ADLs as evidenced by decreasing 9 hole peg test score for BUE by 10-15 secs. Baseline: 9 Hole Peg test: Right: 47.93 sec; Left: 2:00:03 sec Goal status: MET 03/22/2023: Right: 46.26 sec; Left: 141 sec 04/05/23: Left  1:19.91 (strategic placement & order)  4.  Pt will demonstrate improved positioning of L pinkie finger through proper day and/or nighttime splinting as deemed effective, comfortable and appropriate. Baseline: poor active extension of PIP joint Goal status: MET Wearing oval 8 splint daily   5.  Patient will demonstrate at least 16% improvement with quick Dash score (reporting 18% disability or less) indicating improved functional use of affected extremity. Baseline: 34% disability Goal status: IN PROGRESS      ASSESSMENT:  CLINICAL IMPRESSION:  This 10th progress note is for dates: 02/22/23 to 04/06/2023. Pt has met 2/5 STGs and 2/5 LTGs. Pt making progress towards all goals as expected due to chronicity of diagnosis and continues to benefit from skilled OT services in the outpatient setting to work towards remaining goals or until max rehab potential is met.    Patient is a 72 y.o. female who was seen today for occupational therapy treatment for multiple areas due to MS.  Pt has been demonstrating positive response to various activities presented for ROM, strength, coordination and sensory awareness with benefit of memory binder emphasized again today to organize her materials.  Reviewed OT plan of care and considerations for discharge as progress note is due today.  Patient has 1 more session scheduled and continues to have left upper extremity deficits in strength and coordination but no significant loss and abilities i.e. similar strength and coordination noted.  Pt and family will benefit from continued skilled OT services in the outpatient setting to work on impairments and ensure max independence with HEP with reinforcement of FM and vision exercises to help with efforts to progress towards goals.   PERFORMANCE  DEFICITS: in functional skills including ADLs, coordination, dexterity, sensation, edema, ROM, strength, pain, flexibility, Fine motor control, Gross motor control, mobility, balance,  endurance, decreased knowledge of use of DME, and UE functional use, cognitive skills including attention and memory, and psychosocial skills including coping strategies and routines and behaviors.   IMPAIRMENTS: are limiting patient from ADLs, rest and sleep, leisure, and social participation.   CO-MORBIDITIES: may have co-morbidities  that affects occupational performance. Patient will benefit from skilled OT to address above impairments and improve overall function.  REHAB POTENTIAL: Good  PLAN:  OT FREQUENCY: 1-2x/week  OT DURATION: 6 weeks upon return from vacation  PLANNED INTERVENTIONS: self care/ADL training, therapeutic exercise, therapeutic activity, neuromuscular re-education, balance training, functional mobility training, splinting, patient/family education, cognitive remediation/compensation, visual/perceptual remediation/compensation, energy conservation, coping strategies training, and DME and/or AE instructions  RECOMMENDED OTHER SERVICES: May need prescriptions for [facility] and new rollator.  Also may need eye exam.  CONSULTED AND AGREED WITH PLAN OF CARE: Patient and family member/caregiver  PLAN FOR NEXT SESSIONS:  UPOC submission to MD for continued OT services s/p vacations 04/2023.  Continue with MS Education BINDER resources ie) handouts for AE, exercises etc from HEPs already, sensory stimulation and compensatory strategies .memorytips Handout  Review HEP for UE ROM, coordination, strength (putty intro'd 8/1)   sensory stimulation/compensatory strategies (will need handouts)  Review needs for goal for adapted strategies/equipment to maximize safety and independence with ADLs/IADLs.  Victorino Sparrow, OT 04/05/2023, 5:49 PM

## 2023-04-07 ENCOUNTER — Ambulatory Visit: Payer: Medicare Other | Admitting: Physical Therapy

## 2023-04-07 ENCOUNTER — Ambulatory Visit: Payer: Medicare Other | Admitting: Occupational Therapy

## 2023-04-07 DIAGNOSIS — R2681 Unsteadiness on feet: Secondary | ICD-10-CM | POA: Diagnosis not present

## 2023-04-07 DIAGNOSIS — R41844 Frontal lobe and executive function deficit: Secondary | ICD-10-CM

## 2023-04-07 DIAGNOSIS — R278 Other lack of coordination: Secondary | ICD-10-CM

## 2023-04-07 DIAGNOSIS — R2689 Other abnormalities of gait and mobility: Secondary | ICD-10-CM | POA: Diagnosis not present

## 2023-04-07 DIAGNOSIS — M6281 Muscle weakness (generalized): Secondary | ICD-10-CM | POA: Diagnosis not present

## 2023-04-07 DIAGNOSIS — R208 Other disturbances of skin sensation: Secondary | ICD-10-CM | POA: Diagnosis not present

## 2023-04-07 NOTE — Therapy (Signed)
OUTPATIENT OCCUPATIONAL THERAPY NEURO TREATMENT  Patient Name: Shaana Mannina MRN: [id number] DOB:27-Nov-1950, 72 y.o., female Today's Date: 04/07/2023  PCP: Bethanie Dicker, NP REFERRING PROVIDER: Bethanie Dicker, NP  END OF SESSION:  OT End of Session - 04/07/23 1026     Visit Number 11    Number of Visits 12    Date for OT Re-Evaluation 04/15/23    Authorization Type Medicare and [employer] VL 60    Progress Note Due on Visit 20    OT Start Time 1029    OT Stop Time 1100    OT Time Calculation (min) 31 min    Activity Tolerance Patient tolerated treatment well    Behavior During Therapy WFL for tasks assessed/performed              Past Medical History:  Diagnosis Date   Asthma    Breast cancer (HCC) 2014   Right   Chicken pox    CKD (chronic kidney disease), stage III (HCC)    COVID-19 virus infection 12/2020   Hypertension    Multiple sclerosis (HCC)    Seizures (HCC)    Past Surgical History:  Procedure Laterality Date   BREAST SURGERY     MASTECTOMY Right 2014   Patient Active Problem List   Diagnosis Date Noted   Spinal cord disease (HCC) 02/01/2023   Abnormal MRI, cervical spine (06/06/2020 & 11/13/2022) 02/01/2023   Demyelinating disease of central nervous system (HCC) 02/01/2023   Chronic radicular pain of lower extremity (S1) (Bilateral) 02/01/2023   Lower extremity burning pain 02/01/2023   Chronic low back pain (2ry area of Pain) (Bilateral) (R>L) w/ sciatica (Bilateral) 02/01/2023   Chronic hip pain (Bilateral) (L>R) 02/01/2023   Chronic upper extremity pain (intermittent) (Bilateral) 02/01/2023   Allergic rhinitis 01/14/2023   CKD (chronic kidney disease), stage III (HCC)    COVID-19 virus infection 12/2020   Myelopathy (HCC) 09/25/2020   History of right breast cancer 03/13/2020   Status post right mastectomy 03/13/2020   Abnormal gait 09/15/2018   Claustrophobia 09/15/2018   Hypertension 09/15/2018   Focal epilepsy (HCC) 09/15/2018    Multiple sclerosis (HCC) 09/15/2018   Paresthesia 09/15/2018   Chronic lower extremity pain (1ry area of Pain) (Bilateral) (R>L) 12/31/2015   Seizure (HCC) 12/31/2015   Neuropathic pain 06/02/2015   No advance directives 11/02/2012   Refusal of blood transfusion for reasons of conscience 11/02/2012   Malignant neoplasm of upper-outer quadrant of right breast in female, estrogen receptor positive (HCC) 11/01/2012    ONSET DATE: 02/04/2023  REFERRING DIAG: S01.093 (ICD-10-CM) - Left hand weakness G35 (ICD-10-CM) - Multiple sclerosis (HCC)  THERAPY DIAG:  Other lack of coordination  Muscle weakness (generalized)  Frontal lobe and executive function deficit  Rationale for Evaluation and Treatment: Rehabilitation  SUBJECTIVE:   SUBJECTIVE STATEMENT: Pt arrived with her daughter today and conversation held re DC versus continued therapy and they desire continued therapy later in September s/p a few trips patient has planned.    accompanied by: self & initially daughter  PERTINENT HISTORY:   Pt is a 49 yr old AA female with hx of relapsing remitting MS and breast CA with mastectomy as well as seizures, chronic pain in B legs/arms and a lot of nerve pain.  PRECAUTIONS: None  WEIGHT BEARING RESTRICTIONS: No  PAIN:  Are you having pain? Yes: NPRS scale: 7/10 Pain location: legs all the way to feet and arms all the way to her hands; legs >arms Pain description: nerve  pains - pins and needles Aggravating factors: hard to say - sometimes movement is fine but sometimes it makes her feel better Relieving factors: unknown  FALLS: Has patient fallen in last 6 months? No  LIVING ENVIRONMENT: Lives with: lives with their daughter Lives in: Apartment Stairs: No Has following equipment at home: Counselling psychologist, Environmental consultant - 4 wheeled, shower chair, Grab bars, and has grab bar that needs to be installed as the suction grab bar came off the wall during a shower a few months ago - also  may need elevated BSC  PLOF: Independent with basic ADLs, Independent with household mobility with device, and Needs assistance with homemaking  PATIENT GOALS: Use my hands better.  OBJECTIVE:   HAND DOMINANCE: Right  ADLs: Overall ADLs: Pt generally MI Transfers/ambulation related to ADLs: MI with AE Eating: I Grooming: Gets hair/nails done professionally UB Dressing: Sits down to get dressed LB Dressing: Able to do it with a little bit of a struggle but "I can still do it." Toileting: MI Bathing: Showers 2x/week Tub Shower transfers: Supervision - needs grab bar replaced Equipment: Shower seat with back, Walk in shower, and Reacher  IADLs: Shopping: I go shopping sometimes (does not drive). Light housekeeping: I do my own laundry Meal Prep: Daughter does most of it. Every now and then I get in the kitchen. Community mobility: Uses cane in public and walker at home (needs a new rollator due to poor brakes etc) - may need rx for new walker Medication management: Self managed with pill bottles - only uses a pill box only when she goes away/vacation Financial management: Self managed Handwriting: 50% legible   MOBILITY STATUS: Needs Assist: uses AE - rollator at home or cane in public  POSTURE COMMENTS:  No Significant postural limitations Sitting balance: WFL  ACTIVITY TOLERANCE: Activity tolerance: Patient reports only doing 1 thing/day ie) shopping, appt or getting nails done.  Needs to take breaks ie) when doing her hair.  FUNCTIONAL OUTCOME MEASURES: Quick Dash: 34.1 % disability   UPPER EXTREMITY ROM:    Active ROM Right eval Left eval  Shoulder flexion [facility]   Shoulder abduction    Shoulder adduction    Shoulder extension    Shoulder internal rotation    Shoulder external rotation    Elbow flexion    Elbow extension  Decreased end range  Wrist flexion    Wrist extension    Wrist ulnar deviation    Wrist radial deviation    Wrist pronation    Wrist  supination    (Blank rows = not tested)  UPPER EXTREMITY MMT:     MMT Right eval Left eval  Shoulder flexion 4/5 3/5  Shoulder abduction    Shoulder adduction    Shoulder extension    Shoulder internal rotation    Shoulder external rotation    Middle trapezius    Lower trapezius    Elbow flexion    Elbow extension    Wrist flexion    Wrist extension    Wrist ulnar deviation    Wrist radial deviation    Wrist pronation    Wrist supination    (Blank rows = not tested)  HAND FUNCTION: Grip strength: Right: 33.2, 44.9, 35.0  lbs; Left: 19.8, 23.5, 21.8 lbs Evaluation Average: Right 37.7 lbs Left 21.7 lbs  COORDINATION: 9 Hole Peg test: Right: 47.93 sec; Left: 2:00:03 sec Box and Blocks:  Right 22 blocks, Left 18 blocks  SENSATION: Light touch: Impaired  Patient reports R side intact L palmar surface a bit dull and tingly in comparison.  EDEMA: NS  MUSCLE TONE:   COGNITION: Overall cognitive status:  Patient does report some changes in memory  VISION: Subjective report: L eye wanders and doesn't focus (does not like to look in the mirror due to changes which are also noted and pointed out by other family members) Baseline vision: Bifocals Visual history: NA  VISION ASSESSMENT: Impaired Tracking/Visual pursuits: Decreased smoothness with horizontal tracking  Patient has difficulty with following activities due to following visual impairments: eye wanders  PERCEPTION: Not tested  PRAXIS: Not tested  Evaluation OBSERVATIONS: Patient is a well groomed lady who regularly has her nails manicured.  She has L pinkie finger often held in a tight hook position often with difficulty extending it actively.  Awkward positioning noted with pegboard test with L arm ie) alternating between turning wrist pronated/supinated at times.    TODAY'S TREATMENT:                                                                                                                                - Therapeutic activities completed for duration as noted below including:  Continued to review ideas and organization for for memory binder for combining information such as the UE/LE exercises, AE ideas, energy conservation etc.   Patient is provided with easy access to images of exercises but with ability to access detailed instructions also available.  Daughter made aware of benefit of binder verus folder for organizing information.  Reviewed putty exercises with detailed info visible in instructions to avoid locking digits on L side.  Patient encouraged to continue to practicing activities on her own, with plastic versus glass (ie in the kitchen) and to work on daily activities for strength, coordination and vision.  PATIENT EDUCATION: Education details: Animal nutritionist   Person educated: Patient Education method: Explanation, Demonstration, and Verbal cues Education comprehension: verbalized understanding, verbal cues required, and needs further education  HOME EXERCISE PROGRAM: 03/01/23: Coordination activities with images provided 03/08/23: Diplopia HEP 03/10/23: Therapy Putty Exercises - Access Code: [email or web address] 03/15/23: Wrist and Digital extension - Access Code: [email or web address] 03/22/2023: Pencil pushes and back and forth vision HEP  GOALS: Goals reviewed with patient? Yes  SHORT TERM GOALS: Target date: 03/17/2023    Patient will demonstrate BUE HEP (ROM, strength & coordination) with 25% verbal cues or less for proper execution. Baseline: not yet initiated Goal status: MET  2.  Pt will verbalize understanding of adapted strategies/equipment to maximize safety and independence with ADLs/IADLs. Baseline: not yet initiated Goal status: MET 04/04/23 - spoke with doctor re prescriptions/insurance coverage for [facility] and rollator, reviewed button hook, 1 handed nail clippers etc  3.  Patient will demonstrate at least 40 lbs R grip and 25 lbs L grip strength as needed to open jars and other  containers.  Baseline: Evaluation Average: Right 37.7 lbs Left 21.7 lbs  Goal statues:  IN PROGRESS 03/22/2023: Right 57.5 lbs Left 21.6 lbs (met on R)   4. Patient will be aware of sensory stimulation and compensatory strategies for diminished sensation.  Baseline: Patient reports R side intact L palmar surface a bit dull and tingly in comparison.  Goal Status: IN PROGRESS 04/05/23 Ongoing occasional sharp shooting pains/pins & needles that last a few minutes in L hand  5.  Pt will be able to place at least 5 more blocks with each hand with completion of Box and Blocks test. Baseline: Box and Blocks:  Right 22 blocks, Left 18 blocks Goal status: IN PROGRESS 03/22/2023: Right 22 blocks, Left 18 blocks  6.  Pt will demonstrate use of binder for information organization of HEPs, MS resources and medical records. Baseline: not yet initiated - daughter present at eval for recommendation Goal status: IN PROGRESS  LONG TERM GOALS: Target date: 04/08/2023    Pt will verbalize understanding (or locate information in info binder) re: ways to prevent future MS related complications and MS community resources. Baseline: not yet initiated Goal status: IN Progress  2.  Pt will verbalize understanding of ways to keep thinking skills sharp and ways to compensate for STM changes in the future. Baseline: not yet initiated Goal status: IN progress  3.  Pt will demonstrate improved fine motor coordination for ADLs as evidenced by decreasing 9 hole peg test score for BUE by 10-15 secs. Baseline: 9 Hole Peg test: Right: 47.93 sec; Left: 2:00:03 sec Goal status: MET 03/22/2023: Right: 46.26 sec; Left: 141 sec 04/05/23: Left 1:19.91 (strategic placement & order)  4.  Pt will demonstrate improved positioning of L pinkie finger through proper day and/or nighttime splinting as deemed effective, comfortable and appropriate. Baseline: poor active extension of PIP joint Goal status: MET Wearing oval 8 splint  daily   5.  Patient will demonstrate at least 16% improvement with quick Dash score (reporting 18% disability or less) indicating improved functional use of affected extremity. Baseline: 34% disability Goal status: IN PROGRESS      ASSESSMENT:  CLINICAL IMPRESSION:  Patient is a 72 y.o. female who was seen today for occupational therapy treatment for multiple areas due to MS.  Pt has been demonstrating positive response to various activities presented for ROM, strength, coordination and sensory awareness focus today in short session on organization of memory binder to allow access to exercises and resources/education. Pt and family will benefit from continued skilled OT services in the outpatient setting to work on impairments and ensure max independence with HEP with reinforcement of FM and vision exercises to help with efforts to progress towards goals.   PERFORMANCE DEFICITS: in functional skills including ADLs, coordination, dexterity, sensation, edema, ROM, strength, pain, flexibility, Fine motor control, Gross motor control, mobility, balance, endurance, decreased knowledge of use of DME, and UE functional use, cognitive skills including attention and memory, and psychosocial skills including coping strategies and routines and behaviors.   IMPAIRMENTS: are limiting patient from ADLs, rest and sleep, leisure, and social participation.   CO-MORBIDITIES: may have co-morbidities  that affects occupational performance. Patient will benefit from skilled OT to address above impairments and improve overall function.  REHAB POTENTIAL: Good  PLAN:  OT FREQUENCY: 1-2x/week  OT DURATION: 4 weeks - upon return from vacation  PLANNED INTERVENTIONS: self care/ADL training, therapeutic exercise, therapeutic activity, neuromuscular re-education, balance training, functional mobility training, splinting, patient/family education, cognitive remediation/compensation, visual/perceptual  remediation/compensation, energy conservation, coping strategies training, and DME and/or AE instructions  RECOMMENDED OTHER  SERVICES: May need prescriptions for [facility] and new rollator.  Also may need eye exam.  CONSULTED AND AGREED WITH PLAN OF CARE: Patient and family member/caregiver  PLAN FOR NEXT SESSIONS:  Continue with MS Education BINDER resources ie) handouts for AE, exercises etc from HEPs already, sensory stimulation and compensatory strategies .memorytips Handout  Review HEP for UE ROM, coordination, strength (putty intro'd 8/1)   Sensory stimulation/compensatory strategies (will need handouts)  Review needs for goal for adapted strategies/equipment to maximize safety and independence with ADLs/IADLs.  Victorino Sparrow, OT 04/07/2023, 11:57 AM

## 2023-04-07 NOTE — Therapy (Signed)
OUTPATIENT PHYSICAL THERAPY NEURO TREATMENT - RECERTIFICATION   Patient Name: Selena Bender MRN: [id number] DOB:08/22/1950, 72 y.o., female Today's Date: 04/07/2023   PCP: Bethanie Dicker, NP REFERRING PROVIDER: Bethanie Dicker, NP   END OF SESSION:  PT End of Session - 04/07/23 1104     Visit Number 14    Number of Visits 30   recert   Date for PT Re-Evaluation 06/30/23   recert   Authorization Type Medicare    Progress Note Due on Visit 20    PT Start Time 1100    PT Stop Time 1144    PT Time Calculation (min) 44 min    Equipment Utilized During Treatment Gait belt    Activity Tolerance Patient tolerated treatment well    Behavior During Therapy WFL for tasks assessed/performed                      Past Medical History:  Diagnosis Date   Asthma    Breast cancer (HCC) 2014   Right   Chicken pox    CKD (chronic kidney disease), stage III (HCC)    COVID-19 virus infection 12/2020   Hypertension    Multiple sclerosis (HCC)    Seizures (HCC)    Past Surgical History:  Procedure Laterality Date   BREAST SURGERY     MASTECTOMY Right 2014   Patient Active Problem List   Diagnosis Date Noted   Spinal cord disease (HCC) 02/01/2023   Abnormal MRI, cervical spine (06/06/2020 & 11/13/2022) 02/01/2023   Demyelinating disease of central nervous system (HCC) 02/01/2023   Chronic radicular pain of lower extremity (S1) (Bilateral) 02/01/2023   Lower extremity burning pain 02/01/2023   Chronic low back pain (2ry area of Pain) (Bilateral) (R>L) w/ sciatica (Bilateral) 02/01/2023   Chronic hip pain (Bilateral) (L>R) 02/01/2023   Chronic upper extremity pain (intermittent) (Bilateral) 02/01/2023   Allergic rhinitis 01/14/2023   CKD (chronic kidney disease), stage III (HCC)    COVID-19 virus infection 12/2020   Myelopathy (HCC) 09/25/2020   History of right breast cancer 03/13/2020   Status post right mastectomy 03/13/2020   Abnormal gait 09/15/2018    Claustrophobia 09/15/2018   Hypertension 09/15/2018   Focal epilepsy (HCC) 09/15/2018   Multiple sclerosis (HCC) 09/15/2018   Paresthesia 09/15/2018   Chronic lower extremity pain (1ry area of Pain) (Bilateral) (R>L) 12/31/2015   Seizure (HCC) 12/31/2015   Neuropathic pain 06/02/2015   No advance directives 11/02/2012   Refusal of blood transfusion for reasons of conscience 11/02/2012   Malignant neoplasm of upper-outer quadrant of right breast in female, estrogen receptor positive (HCC) 11/01/2012    ONSET DATE: 01/14/2023 (referral date)  REFERRING DIAG: G35 (ICD-10-CM) - Multiple sclerosis (HCC) R26.9 (ICD-10-CM) - Abnormal gait M62.81 (ICD-10-CM) - Generalized muscle weakness  THERAPY DIAG:  Muscle weakness (generalized)  Unsteadiness on feet  Other abnormalities of gait and mobility  Other lack of coordination  Rationale for Evaluation and Treatment: Rehabilitation  SUBJECTIVE:  SUBJECTIVE STATEMENT: Pt reports no acute changes since last visit. Pt denies any falls. Pt reports she feels like she is able to do more since starting PT. Pt reports her pain was bad this morning but as she moves around it calms down.  Pt accompanied by: self and family member daughter [name] (outside)  PERTINENT HISTORY: relapsing remitting MS and breast CA and seizures  PAIN:  Are you having pain? Yes: NPRS scale: 4-5/10 Pain location: legs, hips down into thighs Pain description: pins and needles, burning sensation Aggravating factors: hard to tell just gets bad sometimes Relieving factors: pain medication, sometimes movement  Took pain medicine before she came today.  PRECAUTIONS: Fall  WEIGHT BEARING RESTRICTIONS: No  FALLS: Has patient fallen in last 6 months? No, several near falls  though  LIVING ENVIRONMENT: Lives with: lives with their daughter [name] Lives in: House/apartment Stairs: No Has following equipment at home: Counselling psychologist, Environmental consultant - 4 wheeled, and shower chair *needs new rollator and needs a BSC  PLOF: Independent with basic ADLs, Independent with gait, Independent with transfers, Requires assistive device for independence, and Needs assistance with homemaking  PATIENT GOALS: "to help me move around a little bit better and improve my balance, less pain"  OBJECTIVE:   DIAGNOSTIC FINDINGS:  B hip xrays 02/01/23: FINDINGS: SI joints are patent. Pubic symphysis and rami appear intact. Mild bilateral hip degenerative changes with minimal spurring and joint space narrowing   IMPRESSION: Mild bilateral hip degenerative changes.  Lumbar spine xray 02/01/23: FINDINGS: Five non rib-bearing lumbar type vertebra. Lumbar alignment within normal limits. Vertebral body heights are maintained. No suspicious change in alignment with flexion or extension. Mild disc space narrowing L2-L3 and L4-L5. Moderate facet degenerative changes worst at L4-L5 and L5-S1.   IMPRESSION: Multilevel degenerative changes as described above.  COGNITION: Overall cognitive status: Impaired "I'm not as sharp as I used to be"   SENSATION: Decreased light touch in R lateral knee  EDEMA:  Swelling in L knee most of the time, occasionally in L ankle  POSTURE: rounded shoulders, forward head, and posterior pelvic tilt  LOWER EXTREMITY ROM:     Passive  Right Eval Left Eval  Hip flexion   [facility]  Hip extension    Hip abduction    Hip adduction    Hip internal rotation    Hip external rotation    Knee flexion    Knee extension    Ankle dorsiflexion    Ankle plantarflexion    Ankle inversion    Ankle eversion     (Blank rows = not tested)  LOWER EXTREMITY MMT:    MMT Right Eval Left Eval  Hip flexion 5 2-  Hip extension    Hip abduction    Hip  adduction    Hip internal rotation    Hip external rotation    Knee flexion 5 2-  Knee extension 5 3  Ankle dorsiflexion 4 3  Ankle plantarflexion    Ankle inversion    Ankle eversion    (Blank rows = not tested)   TODAY'S TREATMENT:  Ther Ex SciFit multi-peaks level 5.5 for 8 minutes using BUE/BLEs for neural priming for reciprocal movement, dynamic cardiovascular warmup and increased amplitude of stepping.   TherAct For LTG assessment:  [facility] PT Assessment - 04/07/23 1129       Ambulation/Gait   Gait velocity 32.8 ft over 33 sec = 0.99 ft/sec      Standardized Balance Assessment   Standardized Balance Assessment Timed Up and Go Test;Five Times Sit to Stand    Five times sit to stand comments  25.44   BUE support on arms of chair     Timed Up and Go Test   TUG Normal TUG    Normal TUG (seconds) 23             PATIENT EDUCATION: Education details: Continue HEP, pt requesting to go on pause from PT for a month so she can work on setting up transportation and her other medical appointments--educated her that she will need to schedule and return within 30 days otherwise she will need to obtain a new referral Person educated: Patient Education method: Explanation, Demonstration, and Handouts Education comprehension: verbalized understanding, returned demonstration, and needs further education  HOME EXERCISE PROGRAM: From prior POC: Access Code: [email or web address] URL: [email or web address] Date: 02/15/2023 Prepared by: Peter Congo  Exercises - Sit to Stand Without Arm Support  - 1 x daily - 7 x weekly - 1 sets - 10 reps - Standing March with Counter Support  - 1 x daily - 7 x weekly - 1 sets - 10 reps - Standing Hip Abduction with Counter Support  - 1 x daily - 7 x weekly - 1 sets - 10 reps - Standing Hip Extension with Counter Support   - 1 x daily - 7 x weekly - 1 sets - 10 reps - Seated Ankle Pumps  - 1 x daily - 7 x weekly - 3 sets - 10 reps - Supine March  - 1 x daily - 7 x weekly - 3 sets - 5-10 reps - Supine Bridge  - 1 x daily - 7 x weekly - 1 sets - 10 reps    GOALS: Goals reviewed with patient? Yes  SHORT TERM GOALS: Target date: 03/03/2023  Pt will be independent with initial HEP for improved strength, balance, transfers and gait. Baseline: Goal status:  MET  2.  Pt will improve 5 x STS to less than or equal to 18 seconds to demonstrate improved functional strength and transfer efficiency with BUE support as needed. Baseline: 21.15 sec with heavy UE reliance on arms of chair (6/27), 19.34 sec with BUE support on arms of chair (7/30) Goal status: IN PROGRESS  3.  Pt will improve normal TUG to less than or equal to 35 seconds for improved functional mobility and decreased fall risk with LRAD. Baseline: 40.37 sec with SBQC (6/27), 35 sec with SBQC (7/30) Goal status: MET  4.  Pt will improve gait velocity to at least 1.0 ft/sec for improved gait efficiency and performance at mod I level with LRAD. Baseline: 0.83 ft/sec with SBQC (6/27), 1.13 ft/sec with SBQC (7/30) Goal status: MET   LONG TERM GOALS: Target date: 04/07/2023  Pt will be independent with final HEP for improved strength, balance, transfers and gait. Baseline:  Goal status: MET  2.  Pt will improve 5 x STS to less than or equal to 18 seconds to demonstrate improved functional strength and transfer efficiency with decreased BUE support.  Baseline: 21.15 sec with heavy UE reliance  on arms of chair (6/27), 19.34 sec with BUE support on arms of chair (7/30), 25.44 sec with BUE support on arms of chair (8/29) Goal status: IN PROGRESS  3.  Pt will improve normal TUG to less than or equal to 30 seconds for improved functional mobility and decreased fall risk with LRAD. Baseline: 40.37 sec with SBQC (6/27), 35 sec with SBQC (7/30), 23 sec with SBQC  (8/29) Goal status: MET  4.  Pt will improve gait velocity to at least 1.25 ft/sec for improved gait efficiency and performance at mod I level with LRAD Baseline: 0.83 ft/sec with SBQC (6/27), 1.13 ft/sec with SBQC (7/30), 0.99 ft/sec with SBQC (8/29) Goal status: IN PROGRESS  5.  Pt will improve her score on the MSIS-29 to </= 95 points to demonstrate decreased disability level. Baseline: 105/145 (6/27), 73/145 (8/29) Goal status: MET  NEW SHORT TERM GOALS:   Target date: 05/05/2023   Pt will improve 5 x STS to less than or equal to 20 seconds to demonstrate improved functional strength and transfer efficiency.  Baseline: 21.15 sec with heavy UE reliance on arms of chair (6/27), 19.34 sec with BUE support on arms of chair (7/30), 25.44 sec with BUE support on arms of chair (8/29) Goal status: IN PROGRESS  2.  Pt will improve gait velocity to at least 1.25 ft/sec for improved gait efficiency and performance at SBA level  Baseline: 0.83 ft/sec with SBQC (6/27), 1.13 ft/sec with SBQC (7/30), 0.99 ft/sec with SBQC (8/29) Goal status: INITIAL  3. Floor transfer to be assessed and LTG set Baseline: not assessed Goal status: INITIAL   NEW LONG TERM GOALS:  Target date: 06/02/2023  Pt will improve 5 x STS to less than or equal to 18 seconds to demonstrate improved functional strength and transfer efficiency.  Baseline: 21.15 sec with heavy UE reliance on arms of chair (6/27), 19.34 sec with BUE support on arms of chair (7/30), 25.44 sec with BUE support on arms of chair (8/29) Goal status: IN PROGRESS  2.  Pt will improve gait velocity to at least 1.5 ft/sec for improved gait efficiency and performance at SBA level  Baseline: 0.83 ft/sec with SBQC (6/27), 1.13 ft/sec with SBQC (7/30), 0.99 ft/sec with SBQC (8/29) Goal status: INITIAL  3.  Floor transfer to be assessed and LTG set Baseline: not assessed Goal status: INITIAL     ASSESSMENT:  CLINICAL IMPRESSION: Emphasis of  skilled PT session on assessing LTG, discussing PT POC, and writing new LTG for recertification of PT services. Pt has met 3/5 LTG due to being independent with her HEP, improving her TUG score, and improving her score on the MSIS - 29. She continues to exhibit variable scores on the 5xSTS and with gait speed. Patient wanting to pause PT at this time as she figures out transportation to/from appointments. Pt does continue to benefit from skilled therapy services to work towards increased safety and independence with functional mobility. Continue POC.     OBJECTIVE IMPAIRMENTS: Abnormal gait, cardiopulmonary status limiting activity, decreased activity tolerance, decreased balance, decreased endurance, decreased mobility, difficulty walking, decreased strength, increased edema, impaired sensation, impaired UE functional use, and pain.   ACTIVITY LIMITATIONS: carrying, lifting, bending, standing, squatting, stairs, transfers, and bed mobility  PARTICIPATION LIMITATIONS: meal prep, cleaning, laundry, driving, and community activity  PERSONAL FACTORS: Age, Sex, Time since onset of injury/illness/exacerbation, Transportation, and 1-2 comorbidities: RRMS, cancer, seizures  are also affecting patient's functional outcome.   REHAB POTENTIAL: Fair time since  onset of MS, previous bouts of therapy  CLINICAL DECISION MAKING: Stable/uncomplicated  EVALUATION COMPLEXITY: Moderate  PLAN:  PT FREQUENCY: 2x/week  PT DURATION: 8 weeks + 12 weeks (recert-plan for 2x/week for 8 weeks but patient taking a one month break to figure out transportation)  PLANNED INTERVENTIONS: Therapeutic exercises, Therapeutic activity, Neuromuscular re-education, Balance training, Gait training, Patient/Family education, Self Care, Joint mobilization, Stair training, Vestibular training, Canalith repositioning, Visual/preceptual remediation/compensation, Orthotic/Fit training, DME instructions, Aquatic Therapy, Dry Needling,  Electrical stimulation, Cryotherapy, Moist heat, Taping, Manual therapy, and Re-evaluation  PLAN FOR NEXT SESSION:  Recheck goals-is pt back within 30 days or does she need a new referral? balance, endurance, gait training with rollator, (Pt's old genu recurvatum brace is in [place] desk), anterior weight shift with sit to stands, squats? Practice w/rollator  Could also try estim or bioness in future sessions (let her know when so she can wear shorts)    Peter Congo, PT, DPT, [facility] [address] [place], [place]  [address] Phone:  [phone] Fax:  [phone]  04/07/2023, 12:11 PM

## 2023-04-09 NOTE — Addendum Note (Signed)
Addended by: Victorino Sparrow on: 04/09/2023 09:45 AM   Modules accepted: Orders

## 2023-04-14 ENCOUNTER — Ambulatory Visit: Payer: Medicare Other | Admitting: Physical Therapy

## 2023-04-15 DIAGNOSIS — H6691 Otitis media, unspecified, right ear: Secondary | ICD-10-CM | POA: Diagnosis not present

## 2023-04-15 DIAGNOSIS — H6121 Impacted cerumen, right ear: Secondary | ICD-10-CM | POA: Diagnosis not present

## 2023-04-22 DIAGNOSIS — K208 Other esophagitis without bleeding: Secondary | ICD-10-CM | POA: Diagnosis not present

## 2023-04-22 DIAGNOSIS — T50905A Adverse effect of unspecified drugs, medicaments and biological substances, initial encounter: Secondary | ICD-10-CM | POA: Diagnosis not present

## 2023-04-25 ENCOUNTER — Encounter: Payer: Self-pay | Admitting: *Deleted

## 2023-04-25 ENCOUNTER — Encounter
Payer: Medicare Other | Attending: Physical Medicine and Rehabilitation | Admitting: Physical Medicine and Rehabilitation

## 2023-04-25 ENCOUNTER — Encounter: Payer: Self-pay | Admitting: Physical Medicine and Rehabilitation

## 2023-04-25 VITALS — BP 135/89 | HR 97 | Ht 65.0 in | Wt 146.6 lb

## 2023-04-25 DIAGNOSIS — M5441 Lumbago with sciatica, right side: Secondary | ICD-10-CM

## 2023-04-25 DIAGNOSIS — M792 Neuralgia and neuritis, unspecified: Secondary | ICD-10-CM | POA: Diagnosis not present

## 2023-04-25 DIAGNOSIS — G8929 Other chronic pain: Secondary | ICD-10-CM | POA: Diagnosis not present

## 2023-04-25 DIAGNOSIS — M5442 Lumbago with sciatica, left side: Secondary | ICD-10-CM

## 2023-04-25 DIAGNOSIS — G35 Multiple sclerosis: Secondary | ICD-10-CM

## 2023-04-25 DIAGNOSIS — G35D Multiple sclerosis, unspecified: Secondary | ICD-10-CM

## 2023-04-25 MED ORDER — HYDROCODONE-ACETAMINOPHEN 7.5-325 MG/15ML PO SOLN: 15.0000 mL | Freq: Two times a day (BID) | ORAL | 0 refills | Status: DC | PRN

## 2023-04-25 NOTE — Progress Notes (Signed)
Subjective:    Patient ID: Selena Bender, female    DOB: Aug 05, 1951, 72 y.o.   MRN: [id number]  HPI Pt is a 72 yr old R handed female with hx of relapsing remitting MS and breast CA and seizures here for evaluation of chronic pain in B/L arms and chest due to mastectomy- dx'd 2014- and in remission.  A lot of nerve pain, likely from MS.  Here for f/u on chronic pain/MS.   Norco- has been helping some.  Sometimes it works, and sometimes it doesn't.   Feels better with it, than without it.  Makes it easier to get going to get ot therapy or to doctors' appointments.   Pain now 7-8/10 down from 10/10- still always there, but has subsided a little.   Was able to get from [facility]- but not [facility].   Feels like working a lot better than Tramadol.    Doesn't make her sleepy at all.  Not having constipation- having diarrhea.  Had choking incident the other day, on Norco-  So asking, appropriately for liquid form.       Pain Inventory Average Pain 7 Pain Right Now 7 My pain is sharp and burning  In the last 24 hours, has pain interfered with the following? General activity 5 Relation with others 5 Enjoyment of life 5 What TIME of day is your pain at its worst? daytime Sleep (in general) Fair  Pain is worse with: inactivity and some activites Pain improves with: therapy/exercise and medication Relief from Meds: 5  Family History  Problem Relation Age of Onset   Sickle cell anemia Father    Breast cancer Neg Hx    Social History   Socioeconomic History   Marital status: Widowed    Spouse name: Not on file   Number of children: 2   Years of education: Not on file   Highest education level: Not on file  Occupational History   Not on file  Tobacco Use   Smoking status: Former    Types: Cigarettes   Smokeless tobacco: Never  Vaping Use   Vaping status: Never Used  Substance and Sexual Activity   Alcohol use: Yes    Comment: occ   Drug use: Not Currently     Types: Marijuana    Comment: Gummies   Sexual activity: Not Currently  Other Topics Concern   Not on file  Social History Narrative   Not on file   Social Determinants of Health   Financial Resource Strain: Not on file  Food Insecurity: Not on file  Transportation Needs: Not on file  Physical Activity: Not on file  Stress: Not on file  Social Connections: Not on file   Past Surgical History:  Procedure Laterality Date   BREAST SURGERY     MASTECTOMY Right 2014   Past Surgical History:  Procedure Laterality Date   BREAST SURGERY     MASTECTOMY Right 2014   Past Medical History:  Diagnosis Date   Asthma    Breast cancer (HCC) 2014   Right   Chicken pox    CKD (chronic kidney disease), stage III (HCC)    COVID-19 virus infection 12/2020   Hypertension    Multiple sclerosis (HCC)    Seizures (HCC)    BP 135/89   Pulse 97   Ht 5\' 5"  (1.651 m)   Wt 146 lb 9.6 oz (66.5 kg)   SpO2 96%   BMI 24.40 kg/m   Opioid Risk Score:  Fall Risk Score:  `1  Depression screen [facility] 2/9     04/25/2023   11:20 AM 01/26/2023    9:47 AM 01/14/2023    1:18 PM 09/03/2022   10:54 AM 06/02/2022   11:17 AM 02/05/2022    1:42 PM 10/28/2021   10:45 AM  Depression screen PHQ 2/9  Decreased Interest 2 2 2  0 0 1 2  Down, Depressed, Hopeless 2 1 2  0 0 1 2  PHQ - 2 Score 4 3 4  0 0 2 4  Altered sleeping   2    2  Tired, decreased energy   3    3  Change in appetite   3    0  Feeling bad or failure about yourself    0    0  Trouble concentrating   1    1  Moving slowly or fidgety/restless   0    2  Suicidal thoughts   0    0  PHQ-9 Score   13    12  Difficult doing work/chores   Very difficult    Extremely dIfficult     Review of Systems  Constitutional: Negative.   HENT: Negative.    Eyes: Negative.   Respiratory: Negative.    Cardiovascular: Negative.   Gastrointestinal: Negative.   Endocrine: Negative.   Genitourinary: Negative.   Musculoskeletal:  Positive for gait  problem and myalgias.  Skin: Negative.   Allergic/Immunologic: Negative.   Neurological:  Positive for weakness.  Hematological: Negative.   Psychiatric/Behavioral:  Positive for dysphoric mood.   All other systems reviewed and are negative.      Objective:   Physical Exam  Awake, alert, appropriate, sitting on exam, table, using Quad cane ot walk, NAD Accompanied by daughter TTP across low back and buttocks - Pain radiates down to toes per pt      Assessment & Plan:    Pt is a 72 yr old R handed female with hx of relapsing remitting MS and breast CA and seizures here for evaluation of chronic pain in B/L arms and chest due to mastectomy- dx'd 2014- and in remission.  A lot of nerve pain, likely from MS.  Here for f/u on chronic pain/MS.  Will increase Norco to 7.5/325 and change ot Elixir since had choking episode on Norco last week, requiring an urgent care appointment and getting endoscopy, so will change to elixir.   2.  Will wait to send in other refills on Norco since might need med changes-    3. Got Folic acid and Vit B complex- doing well- no issues taking.   4. Wait on titrating pain meds more today.    5. Not due for UDS today.   6. Reminded to bring bottle next time.  Of Norco.   7. F/U in 3 months- call me in 1 month when refill due to let me know how things going.   8. Bedside commode can be used OVER toilet to push up with arms to get on/off toilet.   9 Also filled out [employer] bus info for pt-   I spent a total of  22  minutes on total care today- >50% coordination of care- due to  filling out papeprwork- discussing options for pain and making pain med changes.

## 2023-04-25 NOTE — Patient Instructions (Addendum)
Pt is a 72 yr old R handed female with hx of relapsing remitting MS and breast CA and seizures here for evaluation of chronic pain in B/L arms and chest due to mastectomy- dx'd 2014- and in remission.  A lot of nerve pain, likely from MS.  Here for f/u on chronic pain/MS.  Will increase Norco to 7.5/325 and change ot Elixir since had choking episode on Norco last week, requiring an urgent care appointment and getting endoscopy, so will change to elixir.   2.  Will wait to send in other refills on Norco since might need med changes-    3. Got Folic acid and Vit B complex- doing well- no issues taking.   4. Wait on titrating pain meds more today.    5. Not due for UDS today.   6. Reminded to bring bottle next time.  Of Norco.   7. F/U in 3 months- call me in 1 month when refill due to let me know how things going.   8. Bedside commode can be used OVER toilet to push up with arms to get on/off toilet.

## 2023-04-25 NOTE — Telephone Encounter (Signed)
error 

## 2023-04-28 ENCOUNTER — Telehealth: Payer: Self-pay | Admitting: *Deleted

## 2023-04-28 MED ORDER — HYDROCODONE-ACETAMINOPHEN 7.5-325 MG PO TABS: 1.0000 | ORAL_TABLET | Freq: Four times a day (QID) | ORAL | 0 refills | Status: DC | PRN

## 2023-04-28 NOTE — Telephone Encounter (Signed)
The Hycet rx sent is out of stock at all the local [facility]. I called patient and advised they would need to find a pharmacy that has med in stock. Patient says just sent pill form and she will get a pill crusher.

## 2023-04-28 NOTE — Addendum Note (Signed)
Addended by: Genice Rouge on: 04/28/2023 01:51 PM   Modules accepted: Orders

## 2023-04-28 NOTE — Telephone Encounter (Signed)
Patient aware.

## 2023-05-03 IMAGING — CR DG CHEST 2V
2 series · 2 of 2 positions shown · non-contrast
Comparison: None.

CLINICAL DATA: Cough

EXAM:
CHEST - 2 VIEW

[w chest lat]
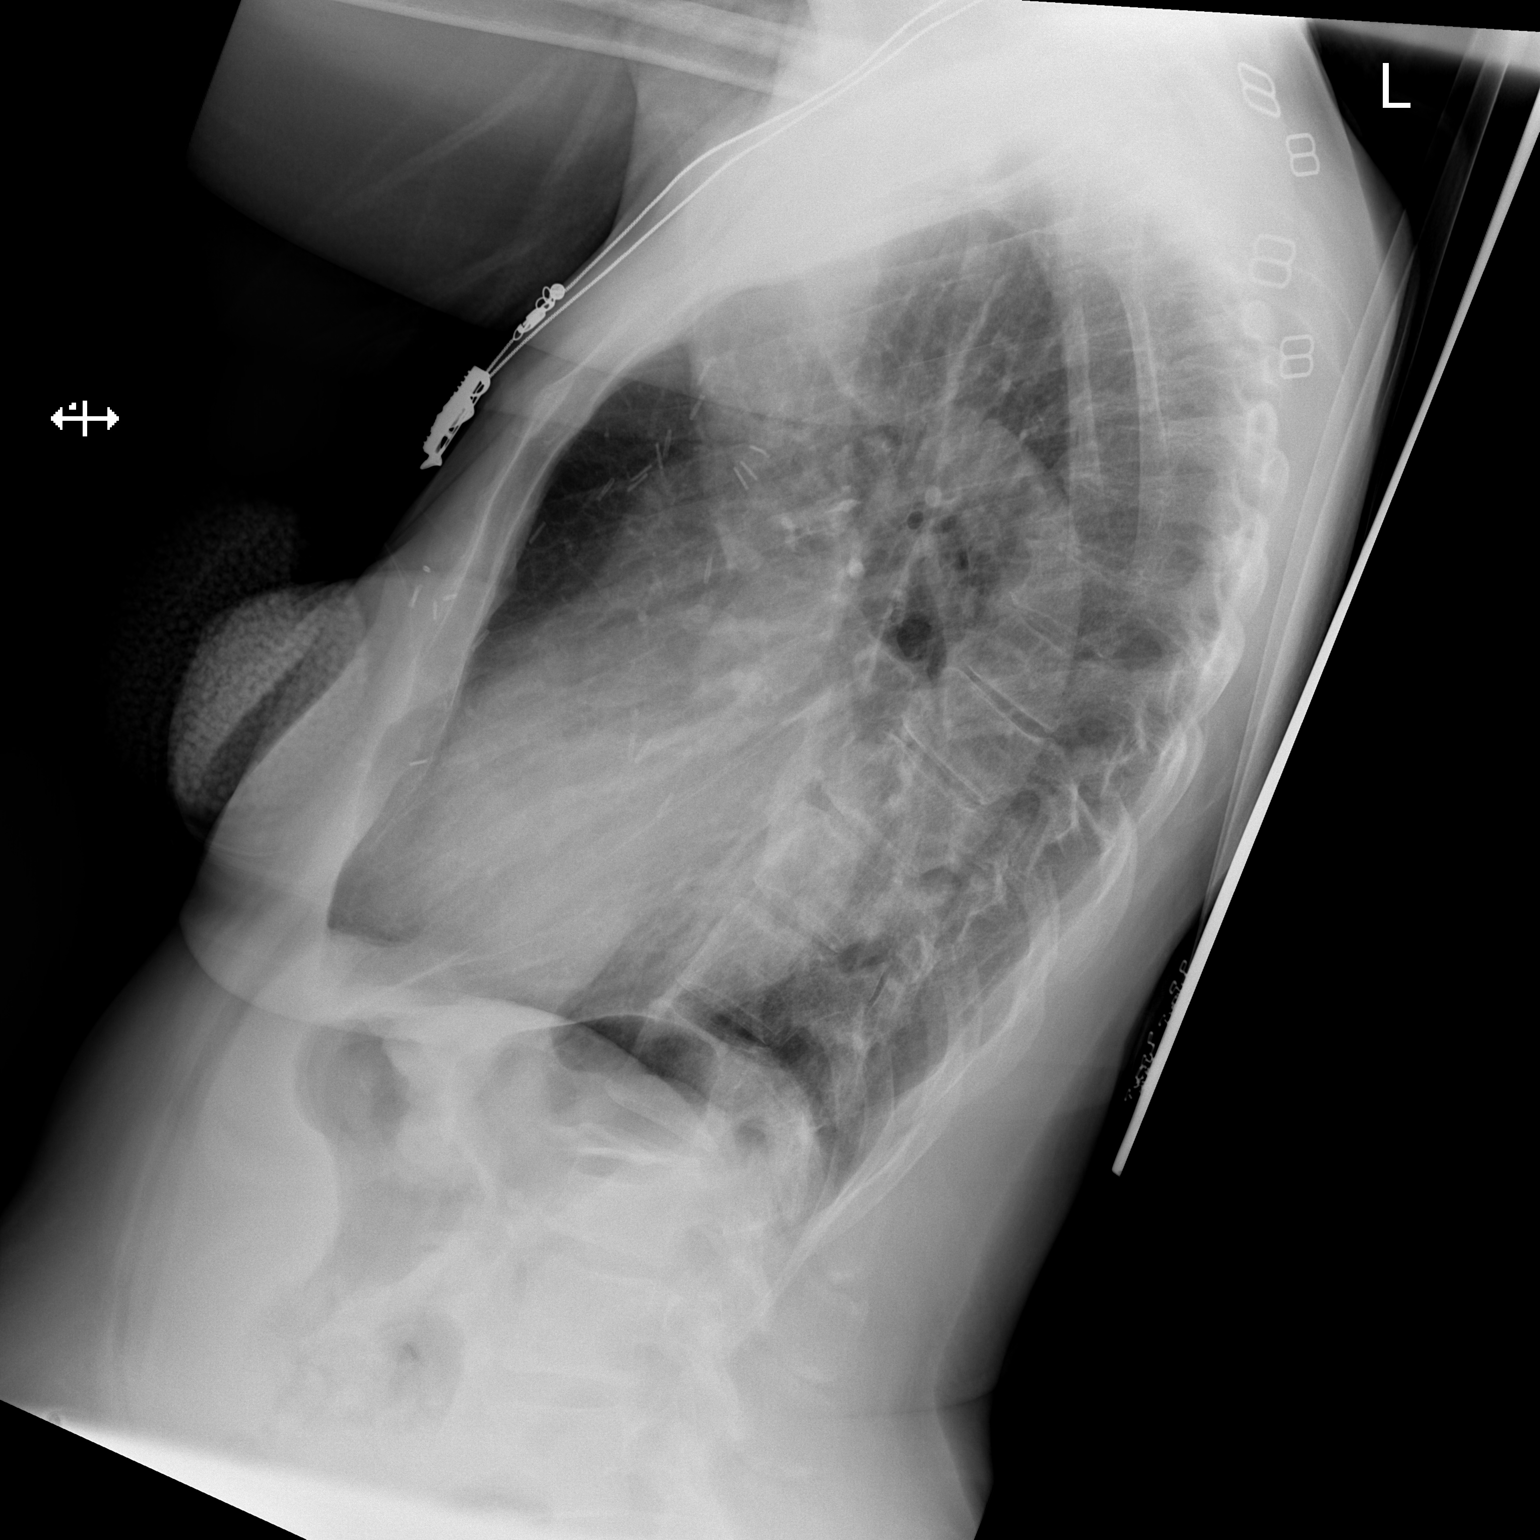

[x chest ap]
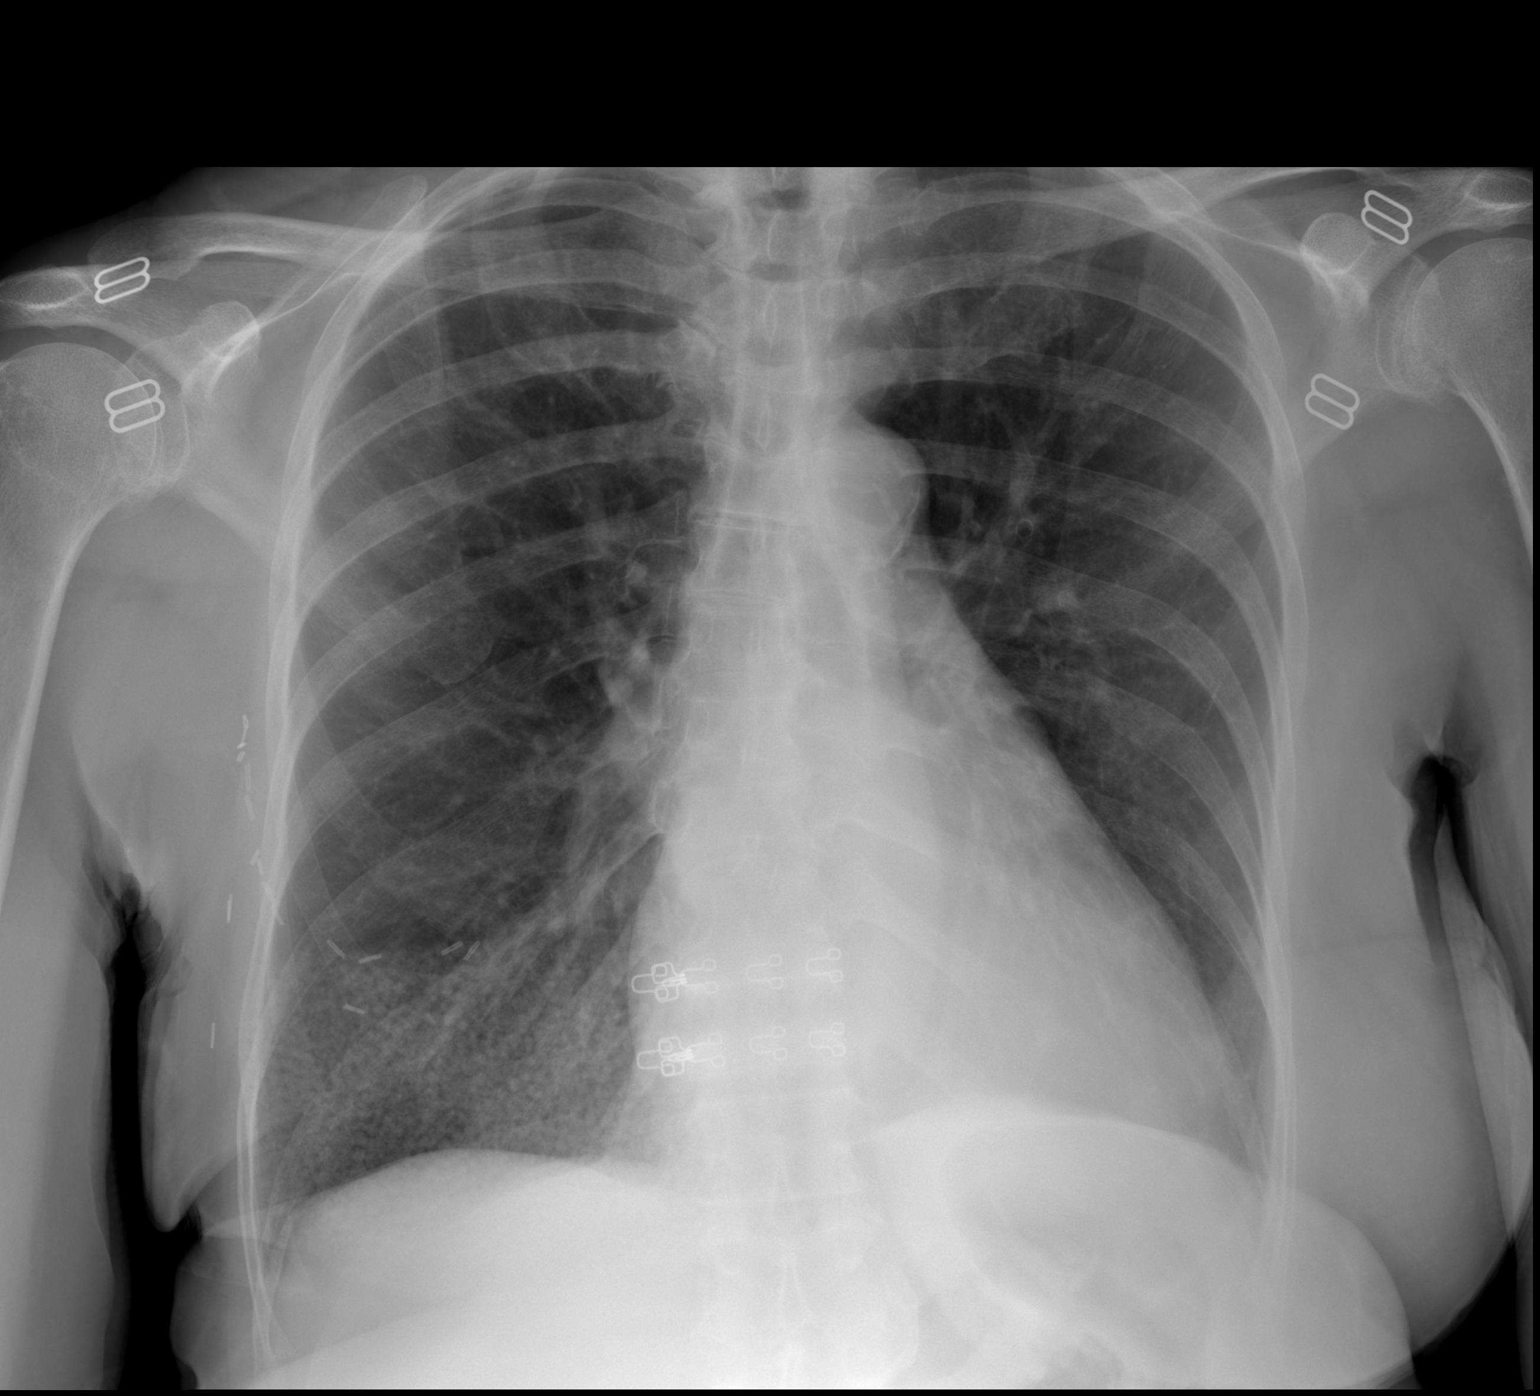

[2 of 2 positions shown; findings below may reference images not displayed]

FINDINGS: The heart is enlarged. Vascular calcifications are seen in the
aortic arch. The lungs are clear. Degenerative changes are seen in
the spine.
IMPRESSION: Cardiomegaly.  No active cardiopulmonary disease.

Aortic Atherosclerosis ([id number]).

## 2023-05-10 DIAGNOSIS — N1832 Chronic kidney disease, stage 3b: Secondary | ICD-10-CM | POA: Diagnosis not present

## 2023-05-12 ENCOUNTER — Ambulatory Visit: Payer: Medicare Other | Attending: Nurse Practitioner | Admitting: Physical Therapy

## 2023-05-12 DIAGNOSIS — R278 Other lack of coordination: Secondary | ICD-10-CM | POA: Insufficient documentation

## 2023-05-12 DIAGNOSIS — R2681 Unsteadiness on feet: Secondary | ICD-10-CM | POA: Diagnosis not present

## 2023-05-12 DIAGNOSIS — R2689 Other abnormalities of gait and mobility: Secondary | ICD-10-CM | POA: Insufficient documentation

## 2023-05-12 DIAGNOSIS — M6281 Muscle weakness (generalized): Secondary | ICD-10-CM | POA: Diagnosis not present

## 2023-05-12 NOTE — Therapy (Signed)
OUTPATIENT PHYSICAL THERAPY NEURO TREATMENT    Patient Name: Selena Bender MRN: [id number] DOB:07/23/1951, 72 y.o., female Today's Date: 05/12/2023   PCP: Bethanie Dicker, NP REFERRING PROVIDER: Bethanie Dicker, NP   END OF SESSION:  PT End of Session - 05/12/23 1109     Visit Number 15    Number of Visits 30   recert   Date for PT Re-Evaluation 06/30/23   recert   Authorization Type Medicare    Progress Note Due on Visit 20    PT Start Time 1105    PT Stop Time 1145    PT Time Calculation (min) 40 min    Equipment Utilized During Treatment Gait belt    Activity Tolerance Patient tolerated treatment well    Behavior During Therapy WFL for tasks assessed/performed                      Past Medical History:  Diagnosis Date   Asthma    Breast cancer (HCC) 2014   Right   Chicken pox    CKD (chronic kidney disease), stage III (HCC)    COVID-19 virus infection 12/2020   Hypertension    Multiple sclerosis (HCC)    Seizures (HCC)    Past Surgical History:  Procedure Laterality Date   BREAST SURGERY     MASTECTOMY Right 2014   Patient Active Problem List   Diagnosis Date Noted   Spinal cord disease (HCC) 02/01/2023   Abnormal MRI, cervical spine (06/06/2020 & 11/13/2022) 02/01/2023   Demyelinating disease of central nervous system (HCC) 02/01/2023   Chronic radicular pain of lower extremity (S1) (Bilateral) 02/01/2023   Lower extremity burning pain 02/01/2023   Chronic low back pain (2ry area of Pain) (Bilateral) (R>L) w/ sciatica (Bilateral) 02/01/2023   Chronic hip pain (Bilateral) (L>R) 02/01/2023   Chronic upper extremity pain (intermittent) (Bilateral) 02/01/2023   Allergic rhinitis 01/14/2023   CKD (chronic kidney disease), stage III (HCC)    COVID-19 virus infection 12/2020   Myelopathy (HCC) 09/25/2020   History of right breast cancer 03/13/2020   Status post right mastectomy 03/13/2020   Abnormal gait 09/15/2018   Claustrophobia 09/15/2018    Hypertension 09/15/2018   Focal epilepsy (HCC) 09/15/2018   Multiple sclerosis (HCC) 09/15/2018   Paresthesia 09/15/2018   Chronic lower extremity pain (1ry area of Pain) (Bilateral) (R>L) 12/31/2015   Seizure (HCC) 12/31/2015   Neuropathic pain 06/02/2015   No advance directives 11/02/2012   Refusal of blood transfusion for reasons of conscience 11/02/2012   Malignant neoplasm of upper-outer quadrant of right breast in female, estrogen receptor positive (HCC) 11/01/2012    ONSET DATE: 01/14/2023 (referral date)  REFERRING DIAG: G35 (ICD-10-CM) - Multiple sclerosis (HCC) R26.9 (ICD-10-CM) - Abnormal gait M62.81 (ICD-10-CM) - Generalized muscle weakness  THERAPY DIAG:  Other lack of coordination  Muscle weakness (generalized)  Unsteadiness on feet  Other abnormalities of gait and mobility  Rationale for Evaluation and Treatment: Rehabilitation  SUBJECTIVE:  SUBJECTIVE STATEMENT: Pt reports "today is not a good day". Got all of her paperwork filled out to submit for transportation, but lost it. [name] brought her today. Had one fall since last visit, turned around the wrong way and fell. Denies injuries. Dani helped her up.   Pt accompanied by: self and family member daughter [name] (outside)  PERTINENT HISTORY: relapsing remitting MS and breast CA and seizures  PAIN:  Are you having pain? Yes: NPRS scale: 6/10 Pain location: legs, hips down into thighs Pain description: pins and needles, burning sensation Aggravating factors: hard to tell just gets bad sometimes Relieving factors: pain medication, sometimes movement  Took pain medicine before she came today.  PRECAUTIONS: Fall  WEIGHT BEARING RESTRICTIONS: No  FALLS: Has patient fallen in last 6 months? No, several near falls  though  LIVING ENVIRONMENT: Lives with: lives with their daughter [name] Lives in: House/apartment Stairs: No Has following equipment at home: Counselling psychologist, Environmental consultant - 4 wheeled, and shower chair *needs new rollator and needs a BSC  PLOF: Independent with basic ADLs, Independent with gait, Independent with transfers, Requires assistive device for independence, and Needs assistance with homemaking  PATIENT GOALS: "to help me move around a little bit better and improve my balance, less pain"  OBJECTIVE:   DIAGNOSTIC FINDINGS:  B hip xrays 02/01/23: FINDINGS: SI joints are patent. Pubic symphysis and rami appear intact. Mild bilateral hip degenerative changes with minimal spurring and joint space narrowing   IMPRESSION: Mild bilateral hip degenerative changes.  Lumbar spine xray 02/01/23: FINDINGS: Five non rib-bearing lumbar type vertebra. Lumbar alignment within normal limits. Vertebral body heights are maintained. No suspicious change in alignment with flexion or extension. Mild disc space narrowing L2-L3 and L4-L5. Moderate facet degenerative changes worst at L4-L5 and L5-S1.   IMPRESSION: Multilevel degenerative changes as described above.  COGNITION: Overall cognitive status: Impaired "I'm not as sharp as I used to be"   SENSATION: Decreased light touch in R lateral knee  EDEMA:  Swelling in L knee most of the time, occasionally in L ankle  POSTURE: rounded shoulders, forward head, and posterior pelvic tilt  LOWER EXTREMITY ROM:     Passive  Right Eval Left Eval  Hip flexion   [facility]   [facility]  Hip extension    Hip abduction    Hip adduction    Hip internal rotation    Hip external rotation    Knee flexion    Knee extension    Ankle dorsiflexion    Ankle plantarflexion    Ankle inversion    Ankle eversion     (Blank rows = not tested)  LOWER EXTREMITY MMT:    MMT Right Eval Left Eval  Hip flexion 5 2-  Hip extension    Hip abduction    Hip  adduction    Hip internal rotation    Hip external rotation    Knee flexion 5 2-  Knee extension 5 3  Ankle dorsiflexion 4 3  Ankle plantarflexion    Ankle inversion    Ankle eversion    (Blank rows = not tested)   TODAY'S TREATMENT:  Ther Act  Provided min A to don tennis shoes, as pt ambulated into session wearing sandals.   STG Assessment   [facility] PT Assessment - 05/12/23 1135       Transfers   Five time sit to stand comments  29.19s w/BUE support   Heavy retropulsion     Ambulation/Gait   Gait velocity 0.91 ft/s w/SBQC              Ther Ex SciFit multi-peaks level 5 for 8 minutes using BUE/BLEs for neural priming for reciprocal movement, dynamic cardiovascular warmup and increased amplitude of stepping. RPE of 7/10 following activity.   Gait pattern:  ER of RLE, step to pattern, decreased arm swing- Right, decreased stride length, decreased hip/knee flexion- Left, decreased ankle dorsiflexion- Left, lateral hip instability, lateral lean- Right, and poor foot clearance- Left Distance walked: Various clinic distances  Assistive device utilized: Quad cane small base Level of assistance: CGA Comments: Pt frequently losing balance posteriorly this date, requiring close guarding for safety.    PATIENT EDUCATION: Education details: Goal outcomes. Continue HEP, importance of setting up transportation services  Person educated: Patient Education method: Explanation Education comprehension: verbalized understanding and needs further education  HOME EXERCISE PROGRAM: From prior POC: Access Code: [email or web address] URL: [email or web address] Date: 02/15/2023 Prepared by: Peter Congo  Exercises - Sit to Stand Without Arm Support  - 1 x daily - 7 x weekly - 1 sets - 10 reps - Standing March with Counter Support  - 1 x daily - 7 x weekly - 1  sets - 10 reps - Standing Hip Abduction with Counter Support  - 1 x daily - 7 x weekly - 1 sets - 10 reps - Standing Hip Extension with Counter Support  - 1 x daily - 7 x weekly - 1 sets - 10 reps - Seated Ankle Pumps  - 1 x daily - 7 x weekly - 3 sets - 10 reps - Supine March  - 1 x daily - 7 x weekly - 3 sets - 5-10 reps - Supine Bridge  - 1 x daily - 7 x weekly - 1 sets - 10 reps    GOALS: Goals reviewed with patient? Yes   NEW SHORT TERM GOALS:   Target date: 05/05/2023   Pt will improve 5 x STS to less than or equal to 20 seconds to demonstrate improved functional strength and transfer efficiency.  Baseline: 21.15 sec with heavy UE reliance on arms of chair (6/27), 19.34 sec with BUE support on arms of chair (7/30), 25.44 sec with BUE support on arms of chair (8/29); 29.19s w/BUE support and heavy retropulsion (10/3) Goal status: NOT MET  2.  Pt will improve gait velocity to at least 1.25 ft/sec for improved gait efficiency and performance at SBA level  Baseline: 0.83 ft/sec with SBQC (6/27), 1.13 ft/sec with SBQC (7/30), 0.99 ft/sec with SBQC (8/29); 0.91 ft/s w/SBQC (10/3) Goal status: NOT MET  3. Floor transfer to be assessed and LTG set Baseline: not assessed Goal status: INITIAL   NEW LONG TERM GOALS:  Target date: 06/02/2023  Pt will improve 5 x STS to less than or equal to 22 seconds to demonstrate improved functional strength and transfer efficiency.  Baseline: 29.19s w/BUE support and heavy retropulsion  Goal status: REVISED  2.  Pt will improve gait velocity to at least 1.2 ft/sec for improved gait efficiency and performance at SBA level  Baseline: 0.91 ft/s w/SBQC (10/3) Goal status: REVISED   3.  Floor transfer to be assessed and LTG set Baseline: not assessed Goal status: INITIAL     ASSESSMENT:  CLINICAL IMPRESSION: Emphasis of skilled PT session on assessing STGs and discussing POC moving forward. Pt reports she has all her paperwork filled out to  set up transportation but lost the papers. Will try to find them today. Pt has not met 2 of 3 STGs, with her 3rd goal not assessed this date due to fatigue. Pt very unsafe during 5x STS, w/inability to stand without UE support on chair and pushing chair backwards, requiring assistance to stabilize chair. Pt also did not meet her gait speed goal but has maintained her speed since previous assessment. Pt in agreement to add more PT visits at a frequency of 1x/week to work on fall recovery (pt had a fall recently), BLE strength and safety. Continue POC.     OBJECTIVE IMPAIRMENTS: Abnormal gait, cardiopulmonary status limiting activity, decreased activity tolerance, decreased balance, decreased endurance, decreased mobility, difficulty walking, decreased strength, increased edema, impaired sensation, impaired UE functional use, and pain.   ACTIVITY LIMITATIONS: carrying, lifting, bending, standing, squatting, stairs, transfers, and bed mobility  PARTICIPATION LIMITATIONS: meal prep, cleaning, laundry, driving, and community activity  PERSONAL FACTORS: Age, Sex, Time since onset of injury/illness/exacerbation, Transportation, and 1-2 comorbidities: RRMS, cancer, seizures  are also affecting patient's functional outcome.   REHAB POTENTIAL: Fair time since onset of MS, previous bouts of therapy  CLINICAL DECISION MAKING: Stable/uncomplicated  EVALUATION COMPLEXITY: Moderate  PLAN:  PT FREQUENCY: 2x/week  PT DURATION: 8 weeks + 12 weeks (recert-plan for 2x/week for 8 weeks but patient taking a one month break to figure out transportation)  PLANNED INTERVENTIONS: Therapeutic exercises, Therapeutic activity, Neuromuscular re-education, Balance training, Gait training, Patient/Family education, Self Care, Joint mobilization, Stair training, Vestibular training, Canalith repositioning, Visual/preceptual remediation/compensation, Orthotic/Fit training, DME instructions, Aquatic Therapy, Dry Needling,  Electrical stimulation, Cryotherapy, Moist heat, Taping, Manual therapy, and Re-evaluation  PLAN FOR NEXT SESSION:  Floor transfer. Sit to stands  balance, endurance, gait training with rollator, (Pt's old genu recurvatum brace is in [place] desk), anterior weight shift with sit to stands, squats? Practice w/rollator  Could also try estim or bioness in future sessions (let her know when so she can wear shorts)   Jill Alexanders Afsheen Antony, PT, DPT [facility] [address] [place], [place]  [address] Phone:  [phone] Fax:  [phone]  05/12/2023, 11:52 AM

## 2023-05-17 ENCOUNTER — Telehealth: Payer: Self-pay | Admitting: Nurse Practitioner

## 2023-05-17 NOTE — Telephone Encounter (Signed)
LVMTCB to reschedule 12/10 appointment

## 2023-05-18 DIAGNOSIS — I129 Hypertensive chronic kidney disease with stage 1 through stage 4 chronic kidney disease, or unspecified chronic kidney disease: Secondary | ICD-10-CM | POA: Diagnosis not present

## 2023-05-18 DIAGNOSIS — Z23 Encounter for immunization: Secondary | ICD-10-CM | POA: Diagnosis not present

## 2023-05-18 DIAGNOSIS — N1832 Chronic kidney disease, stage 3b: Secondary | ICD-10-CM | POA: Diagnosis not present

## 2023-05-26 ENCOUNTER — Ambulatory Visit: Payer: Medicare Other | Admitting: Physical Therapy

## 2023-05-26 DIAGNOSIS — R2689 Other abnormalities of gait and mobility: Secondary | ICD-10-CM

## 2023-05-26 DIAGNOSIS — R2681 Unsteadiness on feet: Secondary | ICD-10-CM

## 2023-05-26 DIAGNOSIS — M6281 Muscle weakness (generalized): Secondary | ICD-10-CM

## 2023-05-26 DIAGNOSIS — R278 Other lack of coordination: Secondary | ICD-10-CM | POA: Diagnosis not present

## 2023-05-26 NOTE — Therapy (Signed)
OUTPATIENT PHYSICAL THERAPY NEURO TREATMENT    Patient Name: Selena Bender MRN: [id number] DOB:1951-05-04, 72 y.o., female Today's Date: 05/26/2023   PCP: Bethanie Dicker, NP REFERRING PROVIDER: Bethanie Dicker, NP   END OF SESSION:  PT End of Session - 05/26/23 1109     Visit Number 16    Number of Visits 30   recert   Date for PT Re-Evaluation 06/30/23   recert   Authorization Type Medicare    Progress Note Due on Visit 20    PT Start Time 1105    PT Stop Time 1145    PT Time Calculation (min) 40 min    Equipment Utilized During Treatment Gait belt    Activity Tolerance Patient tolerated treatment well    Behavior During Therapy WFL for tasks assessed/performed                      Past Medical History:  Diagnosis Date   Asthma    Breast cancer (HCC) 2014   Right   Chicken pox    CKD (chronic kidney disease), stage III (HCC)    COVID-19 virus infection 12/2020   Hypertension    Multiple sclerosis (HCC)    Seizures (HCC)    Past Surgical History:  Procedure Laterality Date   BREAST SURGERY     MASTECTOMY Right 2014   Patient Active Problem List   Diagnosis Date Noted   Spinal cord disease (HCC) 02/01/2023   Abnormal MRI, cervical spine (06/06/2020 & 11/13/2022) 02/01/2023   Demyelinating disease of central nervous system (HCC) 02/01/2023   Chronic radicular pain of lower extremity (S1) (Bilateral) 02/01/2023   Lower extremity burning pain 02/01/2023   Chronic low back pain (2ry area of Pain) (Bilateral) (R>L) w/ sciatica (Bilateral) 02/01/2023   Chronic hip pain (Bilateral) (L>R) 02/01/2023   Chronic upper extremity pain (intermittent) (Bilateral) 02/01/2023   Allergic rhinitis 01/14/2023   CKD (chronic kidney disease), stage III (HCC)    COVID-19 virus infection 12/2020   Myelopathy (HCC) 09/25/2020   History of right breast cancer 03/13/2020   Status post right mastectomy 03/13/2020   Abnormal gait 09/15/2018   Claustrophobia 09/15/2018    Hypertension 09/15/2018   Focal epilepsy (HCC) 09/15/2018   Multiple sclerosis (HCC) 09/15/2018   Paresthesia 09/15/2018   Chronic lower extremity pain (1ry area of Pain) (Bilateral) (R>L) 12/31/2015   Seizure (HCC) 12/31/2015   Neuropathic pain 06/02/2015   No advance directives 11/02/2012   Refusal of blood transfusion for reasons of conscience 11/02/2012   Malignant neoplasm of upper-outer quadrant of right breast in female, estrogen receptor positive (HCC) 11/01/2012    ONSET DATE: 01/14/2023 (referral date)  REFERRING DIAG: G35 (ICD-10-CM) - Multiple sclerosis (HCC) R26.9 (ICD-10-CM) - Abnormal gait M62.81 (ICD-10-CM) - Generalized muscle weakness  THERAPY DIAG:  Muscle weakness (generalized)  Unsteadiness on feet  Other abnormalities of gait and mobility  Rationale for Evaluation and Treatment: Rehabilitation  SUBJECTIVE:  SUBJECTIVE STATEMENT: Pt reports doing okay. Having more pain today. Did not find her transportation paperwork, is requesting new forms and will start over. No falls.   Pt accompanied by: self and family member daughter [name] (outside)  PERTINENT HISTORY: relapsing remitting MS and breast CA and seizures  PAIN:  Are you having pain? Yes: NPRS scale: 6-7/10 Pain location: legs, hips down into thighs Pain description: pins and needles, burning sensation Aggravating factors: hard to tell just gets bad sometimes Relieving factors: pain medication, sometimes movement  Took pain medicine before she came today.  PRECAUTIONS: Fall  WEIGHT BEARING RESTRICTIONS: No  FALLS: Has patient fallen in last 6 months? No, several near falls though  LIVING ENVIRONMENT: Lives with: lives with their daughter [name] Lives in: House/apartment Stairs: No Has following equipment at  home: Counselling psychologist, Environmental consultant - 4 wheeled, and shower chair *needs new rollator and needs a BSC  PLOF: Independent with basic ADLs, Independent with gait, Independent with transfers, Requires assistive device for independence, and Needs assistance with homemaking  PATIENT GOALS: "to help me move around a little bit better and improve my balance, less pain"  OBJECTIVE:   DIAGNOSTIC FINDINGS:  B hip xrays 02/01/23: FINDINGS: SI joints are patent. Pubic symphysis and rami appear intact. Mild bilateral hip degenerative changes with minimal spurring and joint space narrowing   IMPRESSION: Mild bilateral hip degenerative changes.  Lumbar spine xray 02/01/23: FINDINGS: Five non rib-bearing lumbar type vertebra. Lumbar alignment within normal limits. Vertebral body heights are maintained. No suspicious change in alignment with flexion or extension. Mild disc space narrowing L2-L3 and L4-L5. Moderate facet degenerative changes worst at L4-L5 and L5-S1.   IMPRESSION: Multilevel degenerative changes as described above.  COGNITION: Overall cognitive status: Impaired "I'm not as sharp as I used to be"   SENSATION: Decreased light touch in R lateral knee  EDEMA:  Swelling in L knee most of the time, occasionally in L ankle  POSTURE: rounded shoulders, forward head, and posterior pelvic tilt  LOWER EXTREMITY ROM:     Passive  Right Eval Left Eval  Hip flexion   [facility]   [facility]  Hip extension    Hip abduction    Hip adduction    Hip internal rotation    Hip external rotation    Knee flexion    Knee extension    Ankle dorsiflexion    Ankle plantarflexion    Ankle inversion    Ankle eversion     (Blank rows = not tested)  LOWER EXTREMITY MMT:    MMT Right Eval Left Eval  Hip flexion 5 2-  Hip extension    Hip abduction    Hip adduction    Hip internal rotation    Hip external rotation    Knee flexion 5 2-  Knee extension 5 3  Ankle dorsiflexion 4 3  Ankle  plantarflexion    Ankle inversion    Ankle eversion    (Blank rows = not tested)   TODAY'S TREATMENT:  Ther Ex SciFit multi-peaks level 4 for 8 minutes using BUE/BLEs for neural priming for reciprocal movement, dynamic cardiovascular warmup and increased amplitude of stepping. RPE of 5/10 following activity.  Seated deadlfts using green resistance band, x20 reps, for improved posterior chain strength and priming anterior weight shift. Pt w/difficulty anchoring the band on the ground, w/it frequently slipping out from under RLE.  Mass practice sit to stands x15 reps w/min A and max verbal cues for anterior weight shifting. Pt continues to demonstrate premature hip extension, resulting in posterior LOB. Pt unable to perform w/o UE support.  Encouraged pt to bring rollator (in box) to next session for therapist to put together, as she is safest w/rollator. Pt verbalized understanding.   Gait pattern:  ER of RLE, step to pattern, decreased arm swing- Right, decreased stride length, decreased hip/knee flexion- Left, decreased ankle dorsiflexion- Left, lateral hip instability, lateral lean- Right, and poor foot clearance- Left Distance walked: Various clinic distances  Assistive device utilized: Quad cane small base Level of assistance: CGA Comments: Pt frequently losing balance posteriorly this date, requiring close guarding for safety.    PATIENT EDUCATION: Education details: Working on sit to stands at home, bringing rollator to next session so therapist can put it together.  Person educated: Patient Education method: Explanation Education comprehension: verbalized understanding and needs further education  HOME EXERCISE PROGRAM: From prior POC: Access Code: [email or web address] URL: [email or web address] Date: 02/15/2023 Prepared by: Peter Congo  Exercises - Sit to Stand Without Arm Support  - 1 x daily - 7 x weekly - 1 sets - 10 reps - Standing March with Counter Support  - 1 x daily - 7 x weekly - 1 sets - 10 reps - Standing Hip Abduction with Counter Support  - 1 x daily - 7 x weekly - 1 sets - 10 reps - Standing Hip Extension with Counter Support  - 1 x daily - 7 x weekly - 1 sets - 10 reps - Seated Ankle Pumps  - 1 x daily - 7 x weekly - 3 sets - 10 reps - Supine March  - 1 x daily - 7 x weekly - 3 sets - 5-10 reps - Supine Bridge  - 1 x daily - 7 x weekly - 1 sets - 10 reps    GOALS: Goals reviewed with patient? Yes   NEW SHORT TERM GOALS:   Target date: 05/05/2023   Pt will improve 5 x STS to less than or equal to 20 seconds to demonstrate improved functional strength and transfer efficiency.  Baseline: 21.15 sec with heavy UE reliance on arms of chair (6/27), 19.34 sec with BUE support on arms of chair (7/30), 25.44 sec with BUE support on arms of chair (8/29); 29.19s w/BUE support and heavy retropulsion (10/3) Goal status: NOT MET  2.  Pt will improve gait velocity to at least 1.25 ft/sec for improved gait efficiency and performance at SBA level  Baseline: 0.83 ft/sec with SBQC (6/27), 1.13 ft/sec with SBQC (7/30), 0.99 ft/sec with SBQC (8/29); 0.91 ft/s w/SBQC (10/3) Goal status: NOT MET  3. Floor transfer to be assessed and LTG set Baseline: not assessed Goal status: INITIAL   NEW LONG TERM GOALS:  Target date: 06/02/2023  Pt will improve 5 x STS to less than or equal to 22 seconds to demonstrate improved functional strength and transfer efficiency.  Baseline: 29.19s w/BUE support and heavy retropulsion  Goal status: REVISED  2.  Pt will improve gait velocity to at  least 1.2 ft/sec for improved gait efficiency and performance at SBA level  Baseline: 0.91 ft/s w/SBQC (10/3) Goal status: REVISED   3.  Floor transfer to be assessed and LTG set Baseline: not assessed Goal status:  INITIAL     ASSESSMENT:  CLINICAL IMPRESSION: Emphasis of skilled PT session on endurance, transfers and anterior weight shifting. Pt continues to demonstrate difficulty w/anterior weight shift during transfers and gait, resulting  in posterior LOB. Pt much safer w/rollator but reports her new one is still in a box. Pt to bring rollator next session for therapist to put together. Continue POC.     OBJECTIVE IMPAIRMENTS: Abnormal gait, cardiopulmonary status limiting activity, decreased activity tolerance, decreased balance, decreased endurance, decreased mobility, difficulty walking, decreased strength, increased edema, impaired sensation, impaired UE functional use, and pain.   ACTIVITY LIMITATIONS: carrying, lifting, bending, standing, squatting, stairs, transfers, and bed mobility  PARTICIPATION LIMITATIONS: meal prep, cleaning, laundry, driving, and community activity  PERSONAL FACTORS: Age, Sex, Time since onset of injury/illness/exacerbation, Transportation, and 1-2 comorbidities: RRMS, cancer, seizures  are also affecting patient's functional outcome.   REHAB POTENTIAL: Fair time since onset of MS, previous bouts of therapy  CLINICAL DECISION MAKING: Stable/uncomplicated  EVALUATION COMPLEXITY: Moderate  PLAN:  PT FREQUENCY: 2x/week  PT DURATION: 8 weeks + 12 weeks (recert-plan for 2x/week for 8 weeks but patient taking a one month break to figure out transportation)  PLANNED INTERVENTIONS: Therapeutic exercises, Therapeutic activity, Neuromuscular re-education, Balance training, Gait training, Patient/Family education, Self Care, Joint mobilization, Stair training, Vestibular training, Canalith repositioning, Visual/preceptual remediation/compensation, Orthotic/Fit training, DME instructions, Aquatic Therapy, Dry Needling, Electrical stimulation, Cryotherapy, Moist heat, Taping, Manual therapy, and Re-evaluation  PLAN FOR NEXT SESSION:  Put rollator together. Floor transfer.  Sit to stands  balance, endurance, gait training with rollator, (Pt's old genu recurvatum brace is in [place] desk), anterior weight shift with sit to stands, squats?   Could also try estim or bioness in future sessions (let her know when so she can wear shorts)   Jill Alexanders Elizjah Noblet, PT, DPT [facility] [address] [place], [place]  [address] Phone:  [phone] Fax:  [phone]  05/26/2023, 11:55 AM

## 2023-05-27 NOTE — Telephone Encounter (Signed)
Pt never came and picked up orders, orders have now been mailed to pts home.

## 2023-05-31 ENCOUNTER — Ambulatory Visit: Payer: Medicare Other | Admitting: Physical Therapy

## 2023-05-31 ENCOUNTER — Encounter: Payer: Self-pay | Admitting: Physical Therapy

## 2023-05-31 DIAGNOSIS — R2689 Other abnormalities of gait and mobility: Secondary | ICD-10-CM

## 2023-05-31 DIAGNOSIS — R2681 Unsteadiness on feet: Secondary | ICD-10-CM

## 2023-05-31 DIAGNOSIS — R278 Other lack of coordination: Secondary | ICD-10-CM | POA: Diagnosis not present

## 2023-05-31 DIAGNOSIS — M6281 Muscle weakness (generalized): Secondary | ICD-10-CM

## 2023-05-31 NOTE — Therapy (Signed)
OUTPATIENT PHYSICAL THERAPY NEURO TREATMENT    Patient Name: Selena Bender MRN: [id number] DOB:09-23-1950, 72 y.o., female Today's Date: 05/31/2023   PCP: Bethanie Dicker, NP REFERRING PROVIDER: Bethanie Dicker, NP   END OF SESSION:  PT End of Session - 05/31/23 1232     Visit Number 17    Number of Visits 30   recert   Date for PT Re-Evaluation 06/30/23   recert   Authorization Type Medicare    Progress Note Due on Visit 20    PT Start Time 1110    PT Stop Time 1158    PT Time Calculation (min) 48 min    Equipment Utilized During Treatment Gait belt    Activity Tolerance Patient tolerated treatment well    Behavior During Therapy WFL for tasks assessed/performed                       Past Medical History:  Diagnosis Date   Asthma    Breast cancer (HCC) 2014   Right   Chicken pox    CKD (chronic kidney disease), stage III (HCC)    COVID-19 virus infection 12/2020   Hypertension    Multiple sclerosis (HCC)    Seizures (HCC)    Past Surgical History:  Procedure Laterality Date   BREAST SURGERY     MASTECTOMY Right 2014   Patient Active Problem List   Diagnosis Date Noted   Spinal cord disease (HCC) 02/01/2023   Abnormal MRI, cervical spine (06/06/2020 & 11/13/2022) 02/01/2023   Demyelinating disease of central nervous system (HCC) 02/01/2023   Chronic radicular pain of lower extremity (S1) (Bilateral) 02/01/2023   Lower extremity burning pain 02/01/2023   Chronic low back pain (2ry area of Pain) (Bilateral) (R>L) w/ sciatica (Bilateral) 02/01/2023   Chronic hip pain (Bilateral) (L>R) 02/01/2023   Chronic upper extremity pain (intermittent) (Bilateral) 02/01/2023   Allergic rhinitis 01/14/2023   CKD (chronic kidney disease), stage III (HCC)    COVID-19 virus infection 12/2020   Myelopathy (HCC) 09/25/2020   History of right breast cancer 03/13/2020   Status post right mastectomy 03/13/2020   Abnormal gait 09/15/2018   Claustrophobia  09/15/2018   Hypertension 09/15/2018   Focal epilepsy (HCC) 09/15/2018   Multiple sclerosis (HCC) 09/15/2018   Paresthesia 09/15/2018   Chronic lower extremity pain (1ry area of Pain) (Bilateral) (R>L) 12/31/2015   Seizure (HCC) 12/31/2015   Neuropathic pain 06/02/2015   No advance directives 11/02/2012   Refusal of blood transfusion for reasons of conscience 11/02/2012   Malignant neoplasm of upper-outer quadrant of right breast in female, estrogen receptor positive (HCC) 11/01/2012    ONSET DATE: 01/14/2023 (referral date)  REFERRING DIAG: G35 (ICD-10-CM) - Multiple sclerosis (HCC) R26.9 (ICD-10-CM) - Abnormal gait M62.81 (ICD-10-CM) - Generalized muscle weakness  THERAPY DIAG:  Muscle weakness (generalized)  Unsteadiness on feet  Other abnormalities of gait and mobility  Rationale for Evaluation and Treatment: Rehabilitation  SUBJECTIVE:  SUBJECTIVE STATEMENT: Pt has brought in rollator to be assembled - Pt reports she took medication earlier today so she is feeling better. Is waiting on transportation paperwork for [employer] to come in the mail  Pt accompanied by: self and family member daughter [name] (outside)  PERTINENT HISTORY: relapsing remitting MS and breast CA and seizures  PAIN:  Are you having pain? Yes: NPRS scale: 4/10 Pain location: legs, hips down into thighs Pain description: pins and needles, burning sensation Aggravating factors: hard to tell just gets bad sometimes Relieving factors: pain medication, sometimes movement  Took pain medicine before she came today.  PRECAUTIONS: Fall  WEIGHT BEARING RESTRICTIONS: No  FALLS: Has patient fallen in last 6 months? No, several near falls though  LIVING ENVIRONMENT: Lives with: lives with their daughter [name] Lives in:  House/apartment Stairs: No Has following equipment at home: Counselling psychologist, Environmental consultant - 4 wheeled, and shower chair *needs new rollator and needs a BSC  PLOF: Independent with basic ADLs, Independent with gait, Independent with transfers, Requires assistive device for independence, and Needs assistance with homemaking  PATIENT GOALS: "to help me move around a little bit better and improve my balance, less pain"  OBJECTIVE:   DIAGNOSTIC FINDINGS:  B hip xrays 02/01/23: FINDINGS: SI joints are patent. Pubic symphysis and rami appear intact. Mild bilateral hip degenerative changes with minimal spurring and joint space narrowing   IMPRESSION: Mild bilateral hip degenerative changes.  Lumbar spine xray 02/01/23: FINDINGS: Five non rib-bearing lumbar type vertebra. Lumbar alignment within normal limits. Vertebral body heights are maintained. No suspicious change in alignment with flexion or extension. Mild disc space narrowing L2-L3 and L4-L5. Moderate facet degenerative changes worst at L4-L5 and L5-S1.   IMPRESSION: Multilevel degenerative changes as described above.  COGNITION: Overall cognitive status: Impaired "I'm not as sharp as I used to be"   SENSATION: Decreased light touch in R lateral knee  EDEMA:  Swelling in L knee most of the time, occasionally in L ankle  POSTURE: rounded shoulders, forward head, and posterior pelvic tilt  LOWER EXTREMITY ROM:     Passive  Right Eval Left Eval  Hip flexion   [facility]   [facility]  Hip extension    Hip abduction    Hip adduction    Hip internal rotation    Hip external rotation    Knee flexion    Knee extension    Ankle dorsiflexion    Ankle plantarflexion    Ankle inversion    Ankle eversion     (Blank rows = not tested)  LOWER EXTREMITY MMT:    MMT Right Eval Left Eval  Hip flexion 5 2-  Hip extension    Hip abduction    Hip adduction    Hip internal rotation    Hip external rotation    Knee flexion 5 2-   Knee extension 5 3  Ankle dorsiflexion 4 3  Ankle plantarflexion    Ankle inversion    Ankle eversion    (Blank rows = not tested)   TODAY'S TREATMENT:  Ther Ex Sit to stand transfers from mat - 10 reps - tactile cues for anterior weight shift and to stay anteriorly during transitional movement prior to extending - pt has tendency to extend trunk, resulting in LOB posteriorly  Squats 5 reps with bil. UE support on back of chair; 2nd set of 5 reps - less UE support (continued to use bil. UE support but less grip)  SciFit level 2.5 x 3", level 4.0 x 5" with bil. Ue's and LE's Rockerboard inside // bars 10 reps x 2 reps with tactile cues to increase anterior weight shift and to decrease trunk flexion with posterior rocking Pt performed stepping down to floor with RLE from board to floor 10 reps with tactile cues with bil. UE support to facilitate anterior weight shift  GAIT: Gait pattern:  ER of RLE, step to pattern, decreased arm swing- Right, decreased stride length, decreased hip/knee flexion- Left, decreased ankle dorsiflexion- Left, lateral hip instability, lateral lean- Right, and poor foot clearance- Left Distance walked: 115', clinic distances  Assistive device utilized: Quad cane small base Level of assistance: CGA Comments: Rollator assembled by therapist; instructed in locking/unlocking brakes and correct hand placement with sit to/from stand transfers (instructed pt to not pull up with rollator, but to push up from surface she is sitting on)   Ramp training: with rollator - CGA to tilt rollator backward to get front wheels on ramp; cues to hold onto brakes during descension to prevent rollator from rolling too far forward  Curb training; with rollator - min assist for placing front wheels on curb, min assist to bump front wheels off curb onto floor    PATIENT EDUCATION: Education details: Reviewed sit to stand transfers for HEP Person educated: Patient Education method: Explanation Education comprehension: verbalized understanding and needs further education  HOME EXERCISE PROGRAM: From prior POC: Access Code: [email or web address] URL: [email or web address] Date: 02/15/2023 Prepared by: Peter Congo  Exercises - Sit to Stand Without Arm Support  - 1 x daily - 7 x weekly - 1 sets - 10 reps - Standing March with Counter Support  - 1 x daily - 7 x weekly - 1 sets - 10 reps - Standing Hip Abduction with Counter Support  - 1 x daily - 7 x weekly - 1 sets - 10 reps - Standing Hip Extension with Counter Support  - 1 x daily - 7 x weekly - 1 sets - 10 reps - Seated Ankle Pumps  - 1 x daily - 7 x weekly - 3 sets - 10 reps - Supine March  - 1 x daily - 7 x weekly - 3 sets - 5-10 reps - Supine Bridge  - 1 x daily - 7 x weekly - 1 sets - 10 reps    GOALS: Goals reviewed with patient? Yes   NEW SHORT TERM GOALS:   Target date: 05/05/2023   Pt will improve 5 x STS to less than or equal to 20 seconds to demonstrate improved functional strength and transfer efficiency.  Baseline: 21.15 sec with heavy UE reliance on arms of chair (6/27), 19.34 sec with BUE support on arms of chair (7/30), 25.44 sec with BUE support on arms of chair (8/29); 29.19s w/BUE support and heavy retropulsion (10/3) Goal status: NOT MET  2.  Pt will improve gait velocity to at least 1.25 ft/sec for improved gait efficiency and performance at SBA level  Baseline: 0.83 ft/sec with SBQC (6/27), 1.13 ft/sec with SBQC (7/30), 0.99 ft/sec with SBQC (8/29); 0.91  ft/s w/SBQC (10/3) Goal status: NOT MET  3. Floor transfer to be assessed and LTG set Baseline: not assessed Goal status: INITIAL   NEW LONG TERM GOALS:  Target date: 06/02/2023  Pt will improve 5 x STS to less than or equal to 22 seconds to demonstrate improved functional strength and transfer  efficiency.  Baseline: 29.19s w/BUE support and heavy retropulsion  Goal status: REVISED  2.  Pt will improve gait velocity to at least 1.2 ft/sec for improved gait efficiency and performance at SBA level  Baseline: 0.91 ft/s w/SBQC (10/3) Goal status: REVISED   3.  Floor transfer to be assessed and LTG set Baseline: not assessed Goal status: INITIAL     ASSESSMENT:  CLINICAL IMPRESSION: PT session focused on gait training with pt's rollator with cues given for correct hand placement with sit to/from transfers and anterior weight shifting activities including sit to stand transfers and rocker board exercise.  Pt uses hip strategy for posterior weight shift on rocker board but able to increase ankle strategy with tactile cues.  Continue POC.     OBJECTIVE IMPAIRMENTS: Abnormal gait, cardiopulmonary status limiting activity, decreased activity tolerance, decreased balance, decreased endurance, decreased mobility, difficulty walking, decreased strength, increased edema, impaired sensation, impaired UE functional use, and pain.   ACTIVITY LIMITATIONS: carrying, lifting, bending, standing, squatting, stairs, transfers, and bed mobility  PARTICIPATION LIMITATIONS: meal prep, cleaning, laundry, driving, and community activity  PERSONAL FACTORS: Age, Sex, Time since onset of injury/illness/exacerbation, Transportation, and 1-2 comorbidities: RRMS, cancer, seizures  are also affecting patient's functional outcome.   REHAB POTENTIAL: Fair time since onset of MS, previous bouts of therapy  CLINICAL DECISION MAKING: Stable/uncomplicated  EVALUATION COMPLEXITY: Moderate  PLAN:  PT FREQUENCY: 2x/week  PT DURATION: 8 weeks + 12 weeks (recert-plan for 2x/week for 8 weeks but patient taking a one month break to figure out transportation)  PLANNED INTERVENTIONS: Therapeutic exercises, Therapeutic activity, Neuromuscular re-education, Balance training, Gait training, Patient/Family education,  Self Care, Joint mobilization, Stair training, Vestibular training, Canalith repositioning, Visual/preceptual remediation/compensation, Orthotic/Fit training, DME instructions, Aquatic Therapy, Dry Needling, Electrical stimulation, Cryotherapy, Moist heat, Taping, Manual therapy, and Re-evaluation  PLAN FOR NEXT SESSION:  Check LTG's; Floor transfer (pt declined to attempt this on 05-31-23); Sit to stands  balance, endurance, gait training with rollator, (Pt's old genu recurvatum brace is in [place] desk), anterior weight shift with sit to stands, squats?   Could also try estim or bioness in future sessions (let her know when so she can wear shorts)   Kerry Fort, PT [facility] [address] [place], [place]  [address] Phone:  [phone] Fax:  [phone]  05/31/2023, 12:34 PM

## 2023-06-03 ENCOUNTER — Other Ambulatory Visit: Payer: Self-pay | Admitting: Family Medicine

## 2023-06-03 DIAGNOSIS — Z1231 Encounter for screening mammogram for malignant neoplasm of breast: Secondary | ICD-10-CM

## 2023-06-09 ENCOUNTER — Ambulatory Visit: Payer: Medicare Other | Admitting: Physical Therapy

## 2023-06-09 DIAGNOSIS — R2689 Other abnormalities of gait and mobility: Secondary | ICD-10-CM | POA: Diagnosis not present

## 2023-06-09 DIAGNOSIS — M6281 Muscle weakness (generalized): Secondary | ICD-10-CM | POA: Diagnosis not present

## 2023-06-09 DIAGNOSIS — R2681 Unsteadiness on feet: Secondary | ICD-10-CM | POA: Diagnosis not present

## 2023-06-09 DIAGNOSIS — R278 Other lack of coordination: Secondary | ICD-10-CM | POA: Diagnosis not present

## 2023-06-09 NOTE — Therapy (Signed)
OUTPATIENT PHYSICAL THERAPY NEURO TREATMENT    Patient Name: Selena Bender MRN: [id number] DOB:06/15/1951, 72 y.o., female Today's Date: 06/09/2023   PCP: Bethanie Dicker, NP REFERRING PROVIDER: Bethanie Dicker, NP   END OF SESSION:  PT End of Session - 06/09/23 1021     Visit Number 18    Number of Visits 30   recert   Date for PT Re-Evaluation 06/30/23   recert   Authorization Type Medicare    Progress Note Due on Visit 20    PT Start Time 1018    PT Stop Time 1101    PT Time Calculation (min) 43 min    Equipment Utilized During Treatment Gait belt    Activity Tolerance Patient tolerated treatment well    Behavior During Therapy WFL for tasks assessed/performed                       Past Medical History:  Diagnosis Date   Asthma    Breast cancer (HCC) 2014   Right   Chicken pox    CKD (chronic kidney disease), stage III (HCC)    COVID-19 virus infection 12/2020   Hypertension    Multiple sclerosis (HCC)    Seizures (HCC)    Past Surgical History:  Procedure Laterality Date   BREAST SURGERY     MASTECTOMY Right 2014   Patient Active Problem List   Diagnosis Date Noted   Spinal cord disease (HCC) 02/01/2023   Abnormal MRI, cervical spine (06/06/2020 & 11/13/2022) 02/01/2023   Demyelinating disease of central nervous system (HCC) 02/01/2023   Chronic radicular pain of lower extremity (S1) (Bilateral) 02/01/2023   Lower extremity burning pain 02/01/2023   Chronic low back pain (2ry area of Pain) (Bilateral) (R>L) w/ sciatica (Bilateral) 02/01/2023   Chronic hip pain (Bilateral) (L>R) 02/01/2023   Chronic upper extremity pain (intermittent) (Bilateral) 02/01/2023   Allergic rhinitis 01/14/2023   CKD (chronic kidney disease), stage III (HCC)    COVID-19 virus infection 12/2020   Myelopathy (HCC) 09/25/2020   History of right breast cancer 03/13/2020   Status post right mastectomy 03/13/2020   Abnormal gait 09/15/2018   Claustrophobia  09/15/2018   Hypertension 09/15/2018   Focal epilepsy (HCC) 09/15/2018   Multiple sclerosis (HCC) 09/15/2018   Paresthesia 09/15/2018   Chronic lower extremity pain (1ry area of Pain) (Bilateral) (R>L) 12/31/2015   Seizure (HCC) 12/31/2015   Neuropathic pain 06/02/2015   No advance directives 11/02/2012   Refusal of blood transfusion for reasons of conscience 11/02/2012   Malignant neoplasm of upper-outer quadrant of right breast in female, estrogen receptor positive (HCC) 11/01/2012    ONSET DATE: 01/14/2023 (referral date)  REFERRING DIAG: G35 (ICD-10-CM) - Multiple sclerosis (HCC) R26.9 (ICD-10-CM) - Abnormal gait M62.81 (ICD-10-CM) - Generalized muscle weakness  THERAPY DIAG:  Muscle weakness (generalized)  Unsteadiness on feet  Other abnormalities of gait and mobility  Rationale for Evaluation and Treatment: Rehabilitation  SUBJECTIVE:  SUBJECTIVE STATEMENT: Pt reports she feels as though her rollator is too short for her. Having difficulty not kicking the wheels. Is getting a rollator that converts to a transport chair for her "Christmas present". Denies falls or acute changes   Pt accompanied by: self and family member daughter [name] (outside)  PERTINENT HISTORY: relapsing remitting MS and breast CA and seizures  PAIN:  Are you having pain? Yes: NPRS scale: 4/10 Pain location: legs, hips down into thighs Pain description: pins and needles, burning sensation Aggravating factors: hard to tell just gets bad sometimes Relieving factors: pain medication, sometimes movement  Took pain medicine before she came today.  PRECAUTIONS: Fall  WEIGHT BEARING RESTRICTIONS: No  FALLS: Has patient fallen in last 6 months? No, several near falls though  LIVING ENVIRONMENT: Lives with: lives  with their daughter [name] Lives in: House/apartment Stairs: No Has following equipment at home: Counselling psychologist, Environmental consultant - 4 wheeled, and shower chair *needs new rollator and needs a BSC  PLOF: Independent with basic ADLs, Independent with gait, Independent with transfers, Requires assistive device for independence, and Needs assistance with homemaking  PATIENT GOALS: "to help me move around a little bit better and improve my balance, less pain"  OBJECTIVE:   DIAGNOSTIC FINDINGS:  B hip xrays 02/01/23: FINDINGS: SI joints are patent. Pubic symphysis and rami appear intact. Mild bilateral hip degenerative changes with minimal spurring and joint space narrowing   IMPRESSION: Mild bilateral hip degenerative changes.  Lumbar spine xray 02/01/23: FINDINGS: Five non rib-bearing lumbar type vertebra. Lumbar alignment within normal limits. Vertebral body heights are maintained. No suspicious change in alignment with flexion or extension. Mild disc space narrowing L2-L3 and L4-L5. Moderate facet degenerative changes worst at L4-L5 and L5-S1.   IMPRESSION: Multilevel degenerative changes as described above.  COGNITION: Overall cognitive status: Impaired "I'm not as sharp as I used to be"   SENSATION: Decreased light touch in R lateral knee  EDEMA:  Swelling in L knee most of the time, occasionally in L ankle  POSTURE: rounded shoulders, forward head, and posterior pelvic tilt  LOWER EXTREMITY ROM:     Passive  Right Eval Left Eval  Hip flexion   [facility]   [facility]  Hip extension    Hip abduction    Hip adduction    Hip internal rotation    Hip external rotation    Knee flexion    Knee extension    Ankle dorsiflexion    Ankle plantarflexion    Ankle inversion    Ankle eversion     (Blank rows = not tested)  LOWER EXTREMITY MMT:    MMT Right Eval Left Eval  Hip flexion 5 2-  Hip extension    Hip abduction    Hip adduction    Hip internal rotation    Hip  external rotation    Knee flexion 5 2-  Knee extension 5 3  Ankle dorsiflexion 4 3  Ankle plantarflexion    Ankle inversion    Ankle eversion    (Blank rows = not tested)   TODAY'S TREATMENT:  Ther Ex SciFit multi-peaks level 6 for 8 minutes using BUE/BLEs for neural priming for reciprocal movement, dynamic cardiovascular warmup and increased amplitude of stepping.  In // bars, 6" step ups w/glute focus (anterior lean) for improved anterior weight shift, single leg stability and glute med/max strength. Max cues for anteiror lean and proper hand placement, as pt tends to place hands posteriorly to hips, facilitating posterior lean. Min cues to facilitate L knee flexion, as pt tends to circumduct LLE  At mat table, mini squats w/green resistance band around pelvis biasing posterior lean, x10 reps. Pt w/BUE support on back of chair placed in front of her and min A to assist w/anterior lean. Pt w/absent righting reactions when losing balance posteriorly but reported knowing she was falling backwards.    GAIT: Gait pattern:  ER of RLE, step to pattern, decreased arm swing- Right, decreased stride length, decreased hip/knee flexion- Left, decreased ankle dorsiflexion- Left, lateral hip instability, lateral lean- Right, and poor foot clearance- Left Distance walked: clinic distances  Assistive device utilized: Walker - 4 wheeled Level of assistance: SBA Comments: Adjusted rollator height to pt's preference. Noted improved postural control and safety w/rollator compared to [facility].    PATIENT EDUCATION: Education details: Continue HEP  Person educated: Patient Education method: Explanation Education comprehension: verbalized understanding and needs further education  HOME EXERCISE PROGRAM: From prior POC: Access Code: [email or web address] URL:  [email or web address] Date: 02/15/2023 Prepared by: Peter Congo  Exercises - Sit to Stand Without Arm Support  - 1 x daily - 7 x weekly - 1 sets - 10 reps - Standing March with Counter Support  - 1 x daily - 7 x weekly - 1 sets - 10 reps - Standing Hip Abduction with Counter Support  - 1 x daily - 7 x weekly - 1 sets - 10 reps - Standing Hip Extension with Counter Support  - 1 x daily - 7 x weekly - 1 sets - 10 reps - Seated Ankle Pumps  - 1 x daily - 7 x weekly - 3 sets - 10 reps - Supine March  - 1 x daily - 7 x weekly - 3 sets - 5-10 reps - Supine Bridge  - 1 x daily - 7 x weekly - 1 sets - 10 reps    GOALS: Goals reviewed with patient? Yes   NEW SHORT TERM GOALS:   Target date: 05/05/2023   Pt will improve 5 x STS to less than or equal to 20 seconds to demonstrate improved functional strength and transfer efficiency.  Baseline: 21.15 sec with heavy UE reliance on arms of chair (6/27), 19.34 sec with BUE support on arms of chair (7/30), 25.44 sec with BUE support on arms of chair (8/29); 29.19s w/BUE support and heavy retropulsion (10/3) Goal status: NOT MET  2.  Pt will improve gait velocity to at least 1.25 ft/sec for improved gait efficiency and performance at SBA level  Baseline: 0.83 ft/sec with SBQC (6/27), 1.13 ft/sec with SBQC (7/30), 0.99 ft/sec with SBQC (8/29); 0.91 ft/s w/SBQC (10/3) Goal status: NOT MET  3. Floor transfer to be assessed and LTG set Baseline: not assessed Goal status: INITIAL   NEW LONG TERM GOALS:  Target date: 06/02/2023  Pt will improve 5 x STS to less than or equal to 22 seconds to demonstrate improved functional strength and transfer efficiency.  Baseline: 29.19s w/BUE support and heavy retropulsion  Goal status: REVISED  2.  Pt will improve gait velocity to at least 1.2 ft/sec  for improved gait efficiency and performance at SBA level  Baseline: 0.91 ft/s w/SBQC (10/3) Goal status: REVISED   3.  Floor transfer to be  assessed and LTG set Baseline: not assessed Goal status: IN PROGRESS     ASSESSMENT:  CLINICAL IMPRESSION: Emphasis of skilled PT session on anterior weight shifting, glute strength and transfers. Pt continues to require min cues for proper management of rollator due to improper brake management but overall demonstrates improved gait kinematics and safety w/rollator. Pt exhibits absent righting reactions when losing balance posteriorly, requiring min-mod A to prevent posterior LOB. Pt was able to facilitate anterior lean when hands placed anteriorly to body this date. Continue POC.     OBJECTIVE IMPAIRMENTS: Abnormal gait, cardiopulmonary status limiting activity, decreased activity tolerance, decreased balance, decreased endurance, decreased mobility, difficulty walking, decreased strength, increased edema, impaired sensation, impaired UE functional use, and pain.   ACTIVITY LIMITATIONS: carrying, lifting, bending, standing, squatting, stairs, transfers, and bed mobility  PARTICIPATION LIMITATIONS: meal prep, cleaning, laundry, driving, and community activity  PERSONAL FACTORS: Age, Sex, Time since onset of injury/illness/exacerbation, Transportation, and 1-2 comorbidities: RRMS, cancer, seizures  are also affecting patient's functional outcome.   REHAB POTENTIAL: Fair time since onset of MS, previous bouts of therapy  CLINICAL DECISION MAKING: Stable/uncomplicated  EVALUATION COMPLEXITY: Moderate  PLAN:  PT FREQUENCY: 2x/week  PT DURATION: 8 weeks + 12 weeks (recert-plan for 2x/week for 8 weeks but patient taking a one month break to figure out transportation)  PLANNED INTERVENTIONS: Therapeutic exercises, Therapeutic activity, Neuromuscular re-education, Balance training, Gait training, Patient/Family education, Self Care, Joint mobilization, Stair training, Vestibular training, Canalith repositioning, Visual/preceptual remediation/compensation, Orthotic/Fit training, DME  instructions, Aquatic Therapy, Dry Needling, Electrical stimulation, Cryotherapy, Moist heat, Taping, Manual therapy, and Re-evaluation  PLAN FOR NEXT SESSION:  Check LTG's; Floor transfer (pt declined to attempt this on 05-31-23); Sit to stands  balance, endurance, gait training with rollator, (Pt's old genu recurvatum brace is in [place] desk), anterior weight shift with sit to stands, squats?   Could also try estim or bioness in future sessions (let her know when so she can wear shorts)   Jill Alexanders Alicha Raspberry, PT, DPT [facility] [address] [place], [place]  [address] Phone:  [phone] Fax:  [phone]   06/09/2023, 11:04 AM

## 2023-06-14 ENCOUNTER — Ambulatory Visit: Payer: Medicare Other | Attending: Nurse Practitioner | Admitting: Physical Therapy

## 2023-06-14 DIAGNOSIS — R2681 Unsteadiness on feet: Secondary | ICD-10-CM | POA: Insufficient documentation

## 2023-06-14 DIAGNOSIS — R293 Abnormal posture: Secondary | ICD-10-CM | POA: Insufficient documentation

## 2023-06-14 DIAGNOSIS — M6281 Muscle weakness (generalized): Secondary | ICD-10-CM | POA: Insufficient documentation

## 2023-06-14 DIAGNOSIS — R278 Other lack of coordination: Secondary | ICD-10-CM | POA: Diagnosis not present

## 2023-06-14 DIAGNOSIS — G8929 Other chronic pain: Secondary | ICD-10-CM | POA: Diagnosis not present

## 2023-06-14 DIAGNOSIS — R2689 Other abnormalities of gait and mobility: Secondary | ICD-10-CM | POA: Diagnosis not present

## 2023-06-14 DIAGNOSIS — M25562 Pain in left knee: Secondary | ICD-10-CM | POA: Diagnosis not present

## 2023-06-14 NOTE — Therapy (Addendum)
OUTPATIENT PHYSICAL THERAPY NEURO TREATMENT    Patient Name: Selena Bender MRN: [id number] DOB:1951/02/08, 72 y.o., female Today's Date: 06/14/2023   PCP: Bethanie Dicker, NP REFERRING PROVIDER: Bethanie Dicker, NP   END OF SESSION:  PT End of Session - 06/14/23 1103     Visit Number 19    Number of Visits 30   recert   Date for PT Re-Evaluation 06/30/23   recert   Authorization Type Medicare    Progress Note Due on Visit 20    PT Start Time 1104    PT Stop Time 1144    PT Time Calculation (min) 40 min    Equipment Utilized During Treatment Gait belt    Activity Tolerance Patient tolerated treatment well    Behavior During Therapy WFL for tasks assessed/performed                        Past Medical History:  Diagnosis Date   Asthma    Breast cancer (HCC) 2014   Right   Chicken pox    CKD (chronic kidney disease), stage III (HCC)    COVID-19 virus infection 12/2020   Hypertension    Multiple sclerosis (HCC)    Seizures (HCC)    Past Surgical History:  Procedure Laterality Date   BREAST SURGERY     MASTECTOMY Right 2014   Patient Active Problem List   Diagnosis Date Noted   Spinal cord disease (HCC) 02/01/2023   Abnormal MRI, cervical spine (06/06/2020 & 11/13/2022) 02/01/2023   Demyelinating disease of central nervous system (HCC) 02/01/2023   Chronic radicular pain of lower extremity (S1) (Bilateral) 02/01/2023   Lower extremity burning pain 02/01/2023   Chronic low back pain (2ry area of Pain) (Bilateral) (R>L) w/ sciatica (Bilateral) 02/01/2023   Chronic hip pain (Bilateral) (L>R) 02/01/2023   Chronic upper extremity pain (intermittent) (Bilateral) 02/01/2023   Allergic rhinitis 01/14/2023   CKD (chronic kidney disease), stage III (HCC)    COVID-19 virus infection 12/2020   Myelopathy (HCC) 09/25/2020   History of right breast cancer 03/13/2020   Status post right mastectomy 03/13/2020   Abnormal gait 09/15/2018   Claustrophobia  09/15/2018   Hypertension 09/15/2018   Focal epilepsy (HCC) 09/15/2018   Multiple sclerosis (HCC) 09/15/2018   Paresthesia 09/15/2018   Chronic lower extremity pain (1ry area of Pain) (Bilateral) (R>L) 12/31/2015   Seizure (HCC) 12/31/2015   Neuropathic pain 06/02/2015   No advance directives 11/02/2012   Refusal of blood transfusion for reasons of conscience 11/02/2012   Malignant neoplasm of upper-outer quadrant of right breast in female, estrogen receptor positive (HCC) 11/01/2012    ONSET DATE: 01/14/2023 (referral date)  REFERRING DIAG: G35 (ICD-10-CM) - Multiple sclerosis (HCC) R26.9 (ICD-10-CM) - Abnormal gait M62.81 (ICD-10-CM) - Generalized muscle weakness  THERAPY DIAG:  Muscle weakness (generalized)  Unsteadiness on feet  Other abnormalities of gait and mobility  Other lack of coordination  Abnormal posture  Rationale for Evaluation and Treatment: Rehabilitation  SUBJECTIVE:  SUBJECTIVE STATEMENT:  Pt reports she feels as though her rollator is too short for her, "It is just so low". Denies falls or acute changes, just a "near fall backwards", caught self with dresser in bedroom.    Pt accompanied by: self and family member daughter [name] (outside)  PERTINENT HISTORY: relapsing remitting MS and breast CA and seizures  PAIN:   Are you having pain? Yes: NPRS scale: 4-5/10 Pain location: legs, hips down into thighs Pain description: pins and needles, burning sensation Aggravating factors: "nothing I can think of right away" Relieving factors: pain medication, movement  Took pain medicine before she came today.  PRECAUTIONS: Fall  WEIGHT BEARING RESTRICTIONS: No  FALLS: Has patient fallen in last 6 months? No, several near falls though  LIVING ENVIRONMENT: Lives with:  lives with their daughter [name] Lives in: House/apartment Stairs: No Has following equipment at home: Counselling psychologist, Environmental consultant - 4 wheeled, and shower chair *needs new rollator and needs a BSC  PLOF: Independent with basic ADLs, Independent with gait, Independent with transfers, Requires assistive device for independence, and Needs assistance with homemaking  PATIENT GOALS: "to help me move around a little bit better and improve my balance, less pain"  OBJECTIVE:   DIAGNOSTIC FINDINGS:  B hip xrays 02/01/23: FINDINGS: SI joints are patent. Pubic symphysis and rami appear intact. Mild bilateral hip degenerative changes with minimal spurring and joint space narrowing   IMPRESSION: Mild bilateral hip degenerative changes.  Lumbar spine xray 02/01/23: FINDINGS: Five non rib-bearing lumbar type vertebra. Lumbar alignment within normal limits. Vertebral body heights are maintained. No suspicious change in alignment with flexion or extension. Mild disc space narrowing L2-L3 and L4-L5. Moderate facet degenerative changes worst at L4-L5 and L5-S1.   IMPRESSION: Multilevel degenerative changes as described above.  COGNITION: Overall cognitive status: Impaired "I'm not as sharp as I used to be"   SENSATION: Decreased light touch in R lateral knee  EDEMA:  Swelling in L knee most of the time, occasionally in L ankle  POSTURE: rounded shoulders, forward head, and posterior pelvic tilt  LOWER EXTREMITY ROM:     Passive  Right Eval Left Eval  Hip flexion   [facility]   [facility]  Hip extension    Hip abduction    Hip adduction    Hip internal rotation    Hip external rotation    Knee flexion    Knee extension    Ankle dorsiflexion    Ankle plantarflexion    Ankle inversion    Ankle eversion     (Blank rows = not tested)  LOWER EXTREMITY MMT:    MMT Right Eval Left Eval  Hip flexion 5 2-  Hip extension    Hip abduction    Hip adduction    Hip internal rotation    Hip  external rotation    Knee flexion 5 2-  Knee extension 5 3  Ankle dorsiflexion 4 3  Ankle plantarflexion    Ankle inversion    Ankle eversion    (Blank rows = not tested)   TODAY'S TREATMENT:         [facility]  Floor Recovery: Patient educated in floor recovery this visit using teach-back for injury assessment and sequencing of task in clinic setting.  Discussion of transfer of skills to variable scenarios outside the clinic.  Patient has most difficulty with getting LLE out of L half-kneel position, needed Min A for placement of foot.  Performed 1 time. Level of Assist:  Verbal/tactile cues, SBA, and MinA.    Pt reports RPE 9/10, LLE "just would not move", LLE "just feels like it is so heavy" when moving from half-kneel to standing  Massed practice STS from 18" mat table w/ step behind bilateral heels 3" anterior to table to ensure non-staggered feet, CGA throughout  pt electing to bring RLE behind for increased power to stand w/o tactile cue of step behind bilateral heels verbal, visual, and tactile cues to not lock knees when standing several instances of pt locking knees and needing Min A to fully stand or would controlled-lower to sitting  educated pt to keep soft knees to prevent posterior falls Pt requires multiple anterior weight shifts before each successful stand Added 4# medicine ball for increased anterior weight shift x6 repetitions before pt fatigues Pt reports feeling that it is easier to complete STS with medicine ball than without  When standing to leave at end of session, pt unable to stand w/o BUE support   PATIENT EDUCATION:  Education details: Rollator is correct height, Continue HEP, floor recovery safety, STS safety  Person educated: Patient Education method: Explanation Education comprehension: verbalized understanding and needs further  education  HOME EXERCISE PROGRAM:  From prior POC: Access Code: [email or web address] URL: [email or web address] Date: 02/15/2023 Prepared by: Peter Congo  Exercises - Sit to Stand Without Arm Support  - 1 x daily - 7 x weekly - 1 sets - 10 reps - Standing March with Counter Support  - 1 x daily - 7 x weekly - 1 sets - 10 reps - Standing Hip Abduction with Counter Support  - 1 x daily - 7 x weekly - 1 sets - 10 reps - Standing Hip Extension with Counter Support  - 1 x daily - 7 x weekly - 1 sets - 10 reps - Seated Ankle Pumps  - 1 x daily - 7 x weekly - 3 sets - 10 reps - Supine March  - 1 x daily - 7 x weekly - 3 sets - 5-10 reps - Supine Bridge  - 1 x daily - 7 x weekly - 1 sets - 10 reps    GOALS: Goals reviewed with patient? Yes   NEW SHORT TERM GOALS:   Target date: 05/05/2023   Pt will improve 5 x STS to less than or equal to 20 seconds to demonstrate improved functional strength and transfer efficiency.  Baseline: 21.15 sec with heavy UE reliance on arms of chair (6/27), 19.34 sec with BUE support on arms of chair (7/30), 25.44 sec with BUE support on arms of chair (8/29); 29.19s w/BUE support and heavy retropulsion (10/3) Goal status: NOT MET  2.  Pt will improve gait velocity to at least 1.25 ft/sec for improved gait efficiency and performance at SBA level  Baseline: 0.83 ft/sec with SBQC (6/27), 1.13 ft/sec with SBQC (7/30), 0.99 ft/sec with SBQC (8/29); 0.91 ft/s w/SBQC (10/3) Goal status: NOT MET  3. Floor transfer to be assessed and LTG set Baseline: not assessed Goal status: INITIAL   NEW LONG TERM GOALS:  Target date: 06/30/2023 (updated to match last scheduled appointment within cert. date)  Pt will  improve 5 x STS to less than or equal to 22 seconds to demonstrate improved functional strength and transfer efficiency.  Baseline: 29.19s w/BUE support and heavy retropulsion  Goal status: REVISED  2.  Pt will improve gait velocity to at least 1.2 ft/sec  for improved gait efficiency and performance at SBA level  Baseline: 0.91 ft/s w/SBQC (10/3) Goal status: REVISED   3.  Pt will demonstrate knowledge of fall recovery safety and perform fall recovery strategies at a Mod-I level to demonstrate ability to safely recover from a fall without physical assistance. Baseline: Min A w/ LLE out of L half-kneel Goal status: IN PROGRESS     ASSESSMENT:  CLINICAL IMPRESSION: Emphasis of skilled PT session on assessing floor recovery, safety education, and massed practice of sit to stands. Pt requires min A to transfer from standing to floor for LLE management and requires Min A to stand L half-kneel with assist also needed for LLE management. Multiple sit to stands performed from mat table, pt requires tactile cue at bilateral posterior heels to prevent compensatory staggered stance; pt with multiple instances of locking knees when standing leading to posterior LOB w/o righting reactions, requiring Min A to stand or controlled-lower to mat table. Continue POC.     OBJECTIVE IMPAIRMENTS: Abnormal gait, cardiopulmonary status limiting activity, decreased activity tolerance, decreased balance, decreased endurance, decreased mobility, difficulty walking, decreased strength, increased edema, impaired sensation, impaired UE functional use, and pain.   ACTIVITY LIMITATIONS: carrying, lifting, bending, standing, squatting, stairs, transfers, and bed mobility  PARTICIPATION LIMITATIONS: meal prep, cleaning, laundry, driving, and community activity  PERSONAL FACTORS: Age, Sex, Time since onset of injury/illness/exacerbation, Transportation, and 1-2 comorbidities: RRMS, cancer, seizures  are also affecting patient's functional outcome.   REHAB POTENTIAL: Fair time since onset of MS, previous bouts of therapy  CLINICAL DECISION MAKING: Stable/uncomplicated  EVALUATION COMPLEXITY: Moderate  PLAN:  PT FREQUENCY: 2x/week  PT DURATION: 8 weeks + 12 weeks  (recert-plan for 2x/week for 8 weeks but patient taking a one month break to figure out transportation)  PLANNED INTERVENTIONS: Therapeutic exercises, Therapeutic activity, Neuromuscular re-education, Balance training, Gait training, Patient/Family education, Self Care, Joint mobilization, Stair training, Vestibular training, Canalith repositioning, Visual/preceptual remediation/compensation, Orthotic/Fit training, DME instructions, Aquatic Therapy, Dry Needling, Electrical stimulation, Cryotherapy, Moist heat, Taping, Manual therapy, and Re-evaluation  PLAN FOR NEXT SESSION:  20th PN, Floor transfer practice- LLE out of half-kneel; Sit to stands  balance, endurance, gait training with rollator, (Pt's old genu recurvatum brace is in [place] desk), anterior weight shift with sit to stands, squats?   Could also try estim or bioness in future sessions (let her know when so she can wear shorts)  Return her paperwork for transit    Beverely Low, [facility] [address] [place], [place]  [address] Phone:  [phone] Fax:  [phone]   06/14/2023, 1:26 PM

## 2023-06-21 ENCOUNTER — Ambulatory Visit: Payer: Medicare Other | Admitting: Physical Therapy

## 2023-06-21 DIAGNOSIS — R278 Other lack of coordination: Secondary | ICD-10-CM | POA: Diagnosis not present

## 2023-06-21 DIAGNOSIS — R2681 Unsteadiness on feet: Secondary | ICD-10-CM

## 2023-06-21 DIAGNOSIS — M6281 Muscle weakness (generalized): Secondary | ICD-10-CM | POA: Diagnosis not present

## 2023-06-21 DIAGNOSIS — M25562 Pain in left knee: Secondary | ICD-10-CM | POA: Diagnosis not present

## 2023-06-21 DIAGNOSIS — R2689 Other abnormalities of gait and mobility: Secondary | ICD-10-CM

## 2023-06-21 DIAGNOSIS — R293 Abnormal posture: Secondary | ICD-10-CM | POA: Diagnosis not present

## 2023-06-21 NOTE — Therapy (Signed)
OUTPATIENT PHYSICAL THERAPY NEURO TREATMENT - 20TH VISIT PROGRESS NOTE   Patient Name: Selena Bender MRN: [id number] DOB:04-03-1951, 72 y.o., female Today's Date: 06/21/2023   PCP: Bethanie Dicker, NP REFERRING PROVIDER: Bethanie Dicker, NP  Physical Therapy Progress Note   Dates of Reporting Period:02/03/23 - 06/21/23  See Note below for Objective Data and Assessment of Progress/Goals.   END OF SESSION:  PT End of Session - 06/21/23 1320     Visit Number 20    Number of Visits 30   recert   Date for PT Re-Evaluation 06/30/23   recert   Authorization Type Medicare    Progress Note Due on Visit 20    PT Start Time 1318    PT Stop Time 1354   Pt requesting to end early due to fatigue   PT Time Calculation (min) 36 min    Equipment Utilized During Treatment Gait belt    Activity Tolerance Patient limited by fatigue    Behavior During Therapy WFL for tasks assessed/performed                         Past Medical History:  Diagnosis Date   Asthma    Breast cancer (HCC) 2014   Right   Chicken pox    CKD (chronic kidney disease), stage III (HCC)    COVID-19 virus infection 12/2020   Hypertension    Multiple sclerosis (HCC)    Seizures (HCC)    Past Surgical History:  Procedure Laterality Date   BREAST SURGERY     MASTECTOMY Right 2014   Patient Active Problem List   Diagnosis Date Noted   Spinal cord disease (HCC) 02/01/2023   Abnormal MRI, cervical spine (06/06/2020 & 11/13/2022) 02/01/2023   Demyelinating disease of central nervous system (HCC) 02/01/2023   Chronic radicular pain of lower extremity (S1) (Bilateral) 02/01/2023   Lower extremity burning pain 02/01/2023   Chronic low back pain (2ry area of Pain) (Bilateral) (R>L) w/ sciatica (Bilateral) 02/01/2023   Chronic hip pain (Bilateral) (L>R) 02/01/2023   Chronic upper extremity pain (intermittent) (Bilateral) 02/01/2023   Allergic rhinitis 01/14/2023   CKD (chronic kidney disease), stage  III (HCC)    COVID-19 virus infection 12/2020   Myelopathy (HCC) 09/25/2020   History of right breast cancer 03/13/2020   Status post right mastectomy 03/13/2020   Abnormal gait 09/15/2018   Claustrophobia 09/15/2018   Hypertension 09/15/2018   Focal epilepsy (HCC) 09/15/2018   Multiple sclerosis (HCC) 09/15/2018   Paresthesia 09/15/2018   Chronic lower extremity pain (1ry area of Pain) (Bilateral) (R>L) 12/31/2015   Seizure (HCC) 12/31/2015   Neuropathic pain 06/02/2015   No advance directives 11/02/2012   Refusal of blood transfusion for reasons of conscience 11/02/2012   Malignant neoplasm of upper-outer quadrant of right breast in female, estrogen receptor positive (HCC) 11/01/2012    ONSET DATE: 01/14/2023 (referral date)  REFERRING DIAG: G35 (ICD-10-CM) - Multiple sclerosis (HCC) R26.9 (ICD-10-CM) - Abnormal gait M62.81 (ICD-10-CM) - Generalized muscle weakness  THERAPY DIAG:  Muscle weakness (generalized)  Unsteadiness on feet  Other abnormalities of gait and mobility  Rationale for Evaluation and Treatment: Rehabilitation  SUBJECTIVE:  SUBJECTIVE STATEMENT:  Pt reports she feels "shaky" today. Having more pain as well. Denies falls. HEP is going "slowly".   Pt accompanied by: self and family member daughter [name] (outside)  PERTINENT HISTORY: relapsing remitting MS and breast CA and seizures  PAIN:   Are you having pain? Yes: NPRS scale: 7/10 Pain location: legs, hips down into thighs Pain description: pins and needles, burning sensation Aggravating factors: "nothing I can think of right away" Relieving factors: pain medication, movement  Took pain medicine before she came today.  PRECAUTIONS: Fall  WEIGHT BEARING RESTRICTIONS: No  FALLS: Has patient fallen in last 6  months? No, several near falls though  LIVING ENVIRONMENT: Lives with: lives with their daughter [name] Lives in: House/apartment Stairs: No Has following equipment at home: Counselling psychologist, Environmental consultant - 4 wheeled, and shower chair *needs new rollator and needs a BSC  PLOF: Independent with basic ADLs, Independent with gait, Independent with transfers, Requires assistive device for independence, and Needs assistance with homemaking  PATIENT GOALS: "to help me move around a little bit better and improve my balance, less pain"  OBJECTIVE:   DIAGNOSTIC FINDINGS:  B hip xrays 02/01/23: FINDINGS: SI joints are patent. Pubic symphysis and rami appear intact. Mild bilateral hip degenerative changes with minimal spurring and joint space narrowing   IMPRESSION: Mild bilateral hip degenerative changes.  Lumbar spine xray 02/01/23: FINDINGS: Five non rib-bearing lumbar type vertebra. Lumbar alignment within normal limits. Vertebral body heights are maintained. No suspicious change in alignment with flexion or extension. Mild disc space narrowing L2-L3 and L4-L5. Moderate facet degenerative changes worst at L4-L5 and L5-S1.   IMPRESSION: Multilevel degenerative changes as described above.  COGNITION: Overall cognitive status: Impaired "I'm not as sharp as I used to be"   SENSATION: Decreased light touch in R lateral knee  EDEMA:  Swelling in L knee most of the time, occasionally in L ankle  POSTURE: rounded shoulders, forward head, and posterior pelvic tilt  LOWER EXTREMITY ROM:     Passive  Right Eval Left Eval  Hip flexion   [facility]   [facility]  Hip extension    Hip abduction    Hip adduction    Hip internal rotation    Hip external rotation    Knee flexion    Knee extension    Ankle dorsiflexion    Ankle plantarflexion    Ankle inversion    Ankle eversion     (Blank rows = not tested)  LOWER EXTREMITY MMT:    MMT Right Eval Left Eval  Hip flexion 5 2-  Hip  extension    Hip abduction    Hip adduction    Hip internal rotation    Hip external rotation    Knee flexion 5 2-  Knee extension 5 3  Ankle dorsiflexion 4 3  Ankle plantarflexion    Ankle inversion    Ankle eversion    (Blank rows = not tested)   TODAY'S TREATMENT:         Ther Act     Mass practice of sit to stands from 18" mat table holding w/6# med ball, x15-20 reps. Placed wooden beam posterior to feet to prevent sliding RLE posteriorly. Noted significant genu recurvatum of R knee mid-stand, resulting in significant posterior LOB. Mod tactile cues applied to R knee to prevent premature hyperextension and mod verbal cues to shift weight anteriorly x5 reps and pt able to perform full sit to stand without LOB. Min cues  to bend R knee prior to sitting down as pt maintaining hyperextension and falling backwards. Pt able to perform ~10 reps independently w/proper flexion/extension of right knee w/no LOB until becoming fatigued.  Pt performed stand to tall kneel on mat w/CGA and w/UE support on bench, performed mini squats x12 w/CGA for improved posterior chain strength and hip extension. Min A to properly position RLE as pt maintained narrow positioning. Pt very fatigued w/activity but reported feeling as though she accomplished something.  Attempted to end session w/more sit to stands (pt request), but pt too fatigued to perform and requested to end session early. Pt escorted out of session w/rollator.                                                                                                    PATIENT EDUCATION:  Education details: Continue HEP, plan for next session Person educated: Patient Education method: Explanation Education comprehension: verbalized understanding and needs further education  HOME EXERCISE PROGRAM:  From prior POC: Access Code: [email or web address] URL: [email or web address] Date: 02/15/2023 Prepared by: Peter Congo  Exercises - Sit to Stand  Without Arm Support  - 1 x daily - 7 x weekly - 1 sets - 10 reps - Standing March with Counter Support  - 1 x daily - 7 x weekly - 1 sets - 10 reps - Standing Hip Abduction with Counter Support  - 1 x daily - 7 x weekly - 1 sets - 10 reps - Standing Hip Extension with Counter Support  - 1 x daily - 7 x weekly - 1 sets - 10 reps - Seated Ankle Pumps  - 1 x daily - 7 x weekly - 3 sets - 10 reps - Supine March  - 1 x daily - 7 x weekly - 3 sets - 5-10 reps - Supine Bridge  - 1 x daily - 7 x weekly - 1 sets - 10 reps    GOALS: Goals reviewed with patient? Yes   NEW SHORT TERM GOALS:   Target date: 05/05/2023   Pt will improve 5 x STS to less than or equal to 20 seconds to demonstrate improved functional strength and transfer efficiency.  Baseline: 21.15 sec with heavy UE reliance on arms of chair (6/27), 19.34 sec with BUE support on arms of chair (7/30), 25.44 sec with BUE support on arms of chair (8/29); 29.19s w/BUE support and heavy retropulsion (10/3) Goal status: NOT MET  2.  Pt will improve gait velocity to at least 1.25 ft/sec for improved gait efficiency and performance at SBA level  Baseline: 0.83 ft/sec with SBQC (6/27), 1.13 ft/sec with SBQC (7/30), 0.99 ft/sec with SBQC (8/29); 0.91 ft/s w/SBQC (10/3) Goal status: NOT MET  3. Floor transfer to be assessed and LTG set Baseline: not assessed Goal status: INITIAL   NEW LONG TERM GOALS:  Target date: 06/30/2023 (updated to match last scheduled appointment within cert. date)  Pt will improve 5 x STS to less than or equal to 22 seconds to demonstrate improved functional strength and transfer efficiency.  Baseline: 29.19s w/BUE support and heavy  retropulsion  Goal status: REVISED  2.  Pt will improve gait velocity to at least 1.2 ft/sec for improved gait efficiency and performance at SBA level  Baseline: 0.91 ft/s w/SBQC (10/3) Goal status: REVISED   3.  Pt will demonstrate knowledge of fall recovery safety and perform fall  recovery strategies at a Mod-I level to demonstrate ability to safely recover from a fall without physical assistance. Baseline: Min A w/ LLE out of L half-kneel Goal status: IN PROGRESS     ASSESSMENT:  CLINICAL IMPRESSION: Emphasis of skilled PT session on improved transfers, functional BLE strength and endurance. Pt reporting increased levels of fatigue this date and reports she did not eat prior to session. Despite this, pt able to facilitate good anterior weight shift w/sit to stands w/use of tactile cues until reaching fatigue. Pt continues to be limited by premature knee extension w/transfers, resulting in posterior LOB, but did improve this date. Continue POC.     OBJECTIVE IMPAIRMENTS: Abnormal gait, cardiopulmonary status limiting activity, decreased activity tolerance, decreased balance, decreased endurance, decreased mobility, difficulty walking, decreased strength, increased edema, impaired sensation, impaired UE functional use, and pain.   ACTIVITY LIMITATIONS: carrying, lifting, bending, standing, squatting, stairs, transfers, and bed mobility  PARTICIPATION LIMITATIONS: meal prep, cleaning, laundry, driving, and community activity  PERSONAL FACTORS: Age, Sex, Time since onset of injury/illness/exacerbation, Transportation, and 1-2 comorbidities: RRMS, cancer, seizures  are also affecting patient's functional outcome.   REHAB POTENTIAL: Fair time since onset of MS, previous bouts of therapy  CLINICAL DECISION MAKING: Stable/uncomplicated  EVALUATION COMPLEXITY: Moderate  PLAN:  PT FREQUENCY: 2x/week  PT DURATION: 8 weeks + 12 weeks (recert-plan for 2x/week for 8 weeks but patient taking a one month break to figure out transportation)  PLANNED INTERVENTIONS: Therapeutic exercises, Therapeutic activity, Neuromuscular re-education, Balance training, Gait training, Patient/Family education, Self Care, Joint mobilization, Stair training, Vestibular training, Canalith  repositioning, Visual/preceptual remediation/compensation, Orthotic/Fit training, DME instructions, Aquatic Therapy, Dry Needling, Electrical stimulation, Cryotherapy, Moist heat, Taping, Manual therapy, and Re-evaluation  PLAN FOR NEXT SESSION:  Tall kneel on mat- side stepping. Floor transfer practice- LLE out of half-kneel; Sit to stands  balance, endurance, gait training with rollator, (Pt's old genu recurvatum brace is in [place] desk), anterior weight shift with sit to stands, squats?   Could also try estim or bioness in future sessions (let her know when so she can wear shorts)  Return her paperwork for transit    Jill Alexanders Merrell Rettinger, PT, DPT  06/21/2023, 2:01 PM

## 2023-06-28 ENCOUNTER — Ambulatory Visit: Payer: Medicare Other | Admitting: Physical Therapy

## 2023-06-28 DIAGNOSIS — M6281 Muscle weakness (generalized): Secondary | ICD-10-CM

## 2023-06-28 DIAGNOSIS — R293 Abnormal posture: Secondary | ICD-10-CM | POA: Diagnosis not present

## 2023-06-28 DIAGNOSIS — R278 Other lack of coordination: Secondary | ICD-10-CM

## 2023-06-28 DIAGNOSIS — R2689 Other abnormalities of gait and mobility: Secondary | ICD-10-CM | POA: Diagnosis not present

## 2023-06-28 DIAGNOSIS — R2681 Unsteadiness on feet: Secondary | ICD-10-CM

## 2023-06-28 DIAGNOSIS — M25562 Pain in left knee: Secondary | ICD-10-CM | POA: Diagnosis not present

## 2023-06-28 DIAGNOSIS — G8929 Other chronic pain: Secondary | ICD-10-CM

## 2023-06-28 NOTE — Therapy (Addendum)
OUTPATIENT PHYSICAL THERAPY NEURO TREATMENT - RECERTIFICATION   Patient Name: Selena Bender MRN: [id number] DOB:[birth year], 72 y.o., female Today's Date: 06/28/2023   PCP: Selena Dicker, NP REFERRING PROVIDER: Bethanie Dicker, NP    END OF SESSION:  PT End of Session - 06/28/23 1146     Visit Number 21    Number of Visits 30   recert   Date for PT Re-Evaluation 07/26/23   recert   Authorization Type Medicare    Progress Note Due on Visit 20    PT Start Time 1148    PT Stop Time 1232    PT Time Calculation (min) 44 min    Equipment Utilized During Treatment Gait belt    Activity Tolerance Patient limited by fatigue    Behavior During Therapy WFL for tasks assessed/performed                          Past Medical History:  Diagnosis Date   Asthma    Breast cancer (HCC) 2014   Right   Chicken pox    CKD (chronic kidney disease), stage III (HCC)    COVID-19 virus infection 12/2020   Hypertension    Multiple sclerosis (HCC)    Seizures (HCC)    Past Surgical History:  Procedure Laterality Date   BREAST SURGERY     MASTECTOMY Right 2014   Patient Active Problem List   Diagnosis Date Noted   Spinal cord disease (HCC) 02/01/2023   Abnormal MRI, cervical spine (06/06/2020 & 11/13/2022) 02/01/2023   Demyelinating disease of central nervous system (HCC) 02/01/2023   Chronic radicular pain of lower extremity (S1) (Bilateral) 02/01/2023   Lower extremity burning pain 02/01/2023   Chronic low back pain (2ry area of Pain) (Bilateral) (R>L) w/ sciatica (Bilateral) 02/01/2023   Chronic hip pain (Bilateral) (L>R) 02/01/2023   Chronic upper extremity pain (intermittent) (Bilateral) 02/01/2023   Allergic rhinitis 01/14/2023   CKD (chronic kidney disease), stage III (HCC)    COVID-19 virus infection 12/2020   Myelopathy (HCC) 09/25/2020   History of right breast cancer 03/13/2020   Status post right mastectomy 03/13/2020   Abnormal gait 09/15/2018    Claustrophobia 09/15/2018   Hypertension 09/15/2018   Focal epilepsy (HCC) 09/15/2018   Multiple sclerosis (HCC) 09/15/2018   Paresthesia 09/15/2018   Chronic lower extremity pain (1ry area of Pain) (Bilateral) (R>L) 12/31/2015   Seizure (HCC) 12/31/2015   Neuropathic pain 06/02/2015   No advance directives 11/02/2012   Refusal of blood transfusion for reasons of conscience 11/02/2012   Malignant neoplasm of upper-outer quadrant of right breast in female, estrogen receptor positive (HCC) 11/01/2012    ONSET DATE: 01/14/2023 (referral date)  REFERRING DIAG: G35 (ICD-10-CM) - Multiple sclerosis (HCC) R26.9 (ICD-10-CM) - Abnormal gait M62.81 (ICD-10-CM) - Generalized muscle weakness  THERAPY DIAG:  Muscle weakness (generalized)  Unsteadiness on feet  Other abnormalities of gait and mobility  Other lack of coordination  Abnormal posture  Chronic pain of left knee  Rationale for Evaluation and Treatment: Rehabilitation  SUBJECTIVE:  SUBJECTIVE STATEMENT:  "Pretty good, I can't complain" Pt denies falls or other acute changes. On HEP, "I try to do something everyday".  Pt accompanied by: self and family member daughter Selena Bender (outside)  PERTINENT HISTORY: relapsing remitting MS and breast CA and seizures  PAIN:   Are you having pain? Yes: NPRS scale: 4-5/10 Pain location: legs, hips down into thighs Pain description: pins and needles, burning sensation Aggravating factors: "nothing I can think of right away" Relieving factors: pain medication, movement  Took pain medicine before she came today.  PRECAUTIONS: Fall  WEIGHT BEARING RESTRICTIONS: No  FALLS: Has patient fallen in last 6 months? No, several near falls though  LIVING ENVIRONMENT: Lives with: lives with their daughter  Selena Bender Lives in: House/apartment Stairs: No Has following equipment at home: Counselling psychologist, Environmental consultant - 4 wheeled, and shower chair *needs new rollator and needs a BSC  PLOF: Independent with basic ADLs, Independent with gait, Independent with transfers, Requires assistive device for independence, and Needs assistance with homemaking  PATIENT GOALS: "to help me move around a little bit better and improve my balance, less pain"  OBJECTIVE:   DIAGNOSTIC FINDINGS:  B hip xrays 02/01/23: FINDINGS: SI joints are patent. Pubic symphysis and rami appear intact. Mild bilateral hip degenerative changes with minimal spurring and joint space narrowing   IMPRESSION: Mild bilateral hip degenerative changes.  Lumbar spine xray 02/01/23: FINDINGS: Five non rib-bearing lumbar type vertebra. Lumbar alignment within normal limits. Vertebral body heights are maintained. No suspicious change in alignment with flexion or extension. Mild disc space narrowing L2-L3 and L4-L5. Moderate facet degenerative changes worst at L4-L5 and L5-S1.   IMPRESSION: Multilevel degenerative changes as described above.  COGNITION: Overall cognitive status: Impaired "I'm not as sharp as I used to be"   SENSATION: Decreased light touch in R lateral knee  EDEMA:  Swelling in L knee most of the time, occasionally in L ankle  POSTURE: rounded shoulders, forward head, and posterior pelvic tilt  LOWER EXTREMITY ROM:     Passive  Right Eval Left Eval  Hip flexion   [facility]   [facility]  Hip extension    Hip abduction    Hip adduction    Hip internal rotation    Hip external rotation    Knee flexion    Knee extension    Ankle dorsiflexion    Ankle plantarflexion    Ankle inversion    Ankle eversion     (Blank rows = not tested)  LOWER EXTREMITY MMT:    MMT Right Eval Left Eval  Hip flexion 5 2-  Hip extension    Hip abduction    Hip adduction    Hip internal rotation    Hip external rotation    Knee  flexion 5 2-  Knee extension 5 3  Ankle dorsiflexion 4 3  Ankle plantarflexion    Ankle inversion    Ankle eversion    (Blank rows = not tested)   TODAY'S TREATMENT:         Ther Act    Tall kneeling on mat table with kay table in front, side-stepping along 4 ft distance x3 minutes w/ BLEs leading, BUE support required, CGA Pt reports this activity was hard, especially getting the LLE to lift "at all"   [facility] PT Assessment - 06/28/23 1348       Standardized Balance Assessment   Five times sit to stand comments  19.06   BUE support  TherEx [facility] march overs x5 repetitions each direction, pt uses BUEs to help LLE with hip flexion Verbally cued to have muscles in LLE do as much as they can first before giving assistance  L Knee flexion against red theraband- from extended position to ~70* flexion, 2 sets x8 repetitions Demonstrated on R first Pt unable to attain knee flexion beyond 70* in sitting  Attempted supine heel slides, pt unable to attain more than 10* knee flexion Attempted R sidelying knee flexion, pt unable to attain any active knee flexion  Supine heel slide w/ sheet x5 repetitions Instructed pt how to set up the strap at home to be able to perform this exercise Instructed pt to let her muscles perform as much of the activity as possible before assisting Pt reports feeling good activation of muscles                                                                                                   PATIENT EDUCATION:  Education details: HEP additions Person educated: Patient Education method: Explanation, Demonstration, Tactile cues, Verbal cues, and Handouts Education comprehension: verbalized understanding, returned demonstration, verbal cues required, tactile cues required, and needs further education  HOME EXERCISE PROGRAM:  From prior POC: Access Code: [email or web address] URL: [email or web address] Date: 02/15/2023 Prepared by: Peter Congo  Exercises - Sit to Stand Without Arm Support  - 1 x daily - 7 x weekly - 1 sets - 10 reps - Standing March with Counter Support  - 1 x daily - 7 x weekly - 1 sets - 10 reps - Standing Hip Abduction with Counter Support  - 1 x daily - 7 x weekly - 1 sets - 10 reps - Standing Hip Extension with Counter Support  - 1 x daily - 7 x weekly - 1 sets - 10 reps - Seated Ankle Pumps  - 1 x daily - 7 x weekly - 3 sets - 10 reps - Supine March  - 1 x daily - 7 x weekly - 3 sets - 5-10 reps - Supine Bridge  - 1 x daily - 7 x weekly - 1 sets - 10 reps - Seated Knee Flexion  - 1 x daily - 7 x weekly - 3 sets - 10 reps - Supine Heel Slide with Strap  - 1 x daily - 7 x weekly - 3 sets - 10 reps    GOALS: Goals reviewed with patient? Yes   NEW SHORT TERM GOALS:   Target date: 05/05/2023   Pt will improve 5 x STS to less than or equal to 20 seconds to demonstrate improved functional strength and transfer efficiency.  Baseline: 21.15 sec with heavy UE reliance on arms of chair (6/27), 19.34 sec with BUE support on arms of chair (7/30), 25.44 sec with BUE support on arms of chair (8/29); 29.19s w/BUE support and heavy retropulsion (10/3) Goal status: NOT MET  2.  Pt will improve gait velocity to at least 1.25 ft/sec for improved gait efficiency and performance at SBA level  Baseline: 0.83 ft/sec with SBQC (6/27), 1.13 ft/sec with [facility] (7/30),  0.99 ft/sec with SBQC (8/29); 0.91 ft/s w/SBQC (10/3) Goal status: NOT MET  3. Floor transfer to be assessed and LTG set Baseline: not assessed Goal status: INITIAL   NEW LONG TERM GOALS:  Target date: 06/30/2023 (updated to match last scheduled appointment within cert. date)  Pt will improve 5 x STS to less than or equal to 22 seconds to demonstrate improved functional strength and transfer efficiency.  Baseline: 29.19s w/BUE support and heavy retropulsion; 19.06 seconds w/ heavy reliance on BUE support Goal status: MET  2.  Pt will improve gait  velocity to at least 1.2 ft/sec for improved gait efficiency and performance at SBA level  Baseline: 0.91 ft/s w/SBQC (10/3) Goal status: REVISED   3.  Pt will demonstrate knowledge of fall recovery safety and perform fall recovery strategies at a Mod-I level to demonstrate ability to safely recover from a fall without physical assistance. Baseline: Min A w/ LLE out of L half-kneel Goal status: IN PROGRESS  NEW NEW LONG TERM GOALS:  Target date: 07/26/2023  1.  Pt will improve gait velocity to at least 1.2 ft/sec for improved gait efficiency and performance at SBA level  Baseline: 0.91 ft/s w/SBQC (10/3) Goal status: REVISED   2.  Pt will demonstrate knowledge of fall recovery safety and perform fall recovery strategies at a Mod-I level to demonstrate ability to safely recover from a fall without physical assistance. Baseline: Min A w/ LLE out of L half-kneel Goal status: IN PROGRESS    ASSESSMENT:  CLINICAL IMPRESSION:  Emphasis of skilled PT session on BLE strengthening and initiating assessment of LTG for recertification of PT services this date to include last 2 scheduled appointments. Pt met 1/1 LTG assessed this date with improved 5x STS score. Pt continues to demonstrate weak hip and knee flexors, not able to complete full ROM against gravity w/o physical assistance for LLE. Pt reports understanding additions to HEP and importance of continuing to perform exercises daily for strength gains. This date, pt initially attempted 5x STS w/o UE use but unable to complete one STS, regressed to BUE support and pt completed in 19.06 seconds, demonstrating improvement from last time tested. Continue POC.     OBJECTIVE IMPAIRMENTS: Abnormal gait, cardiopulmonary status limiting activity, decreased activity tolerance, decreased balance, decreased endurance, decreased mobility, difficulty walking, decreased strength, increased edema, impaired sensation, impaired UE functional use, and pain.    ACTIVITY LIMITATIONS: carrying, lifting, bending, standing, squatting, stairs, transfers, and bed mobility  PARTICIPATION LIMITATIONS: meal prep, cleaning, laundry, driving, and community activity  PERSONAL FACTORS: Age, Sex, Time since onset of injury/illness/exacerbation, Transportation, and 1-2 comorbidities: RRMS, cancer, seizures  are also affecting patient's functional outcome.   REHAB POTENTIAL: Fair time since onset of MS, previous bouts of therapy  CLINICAL DECISION MAKING: Stable/uncomplicated  EVALUATION COMPLEXITY: Moderate  PLAN:  PT FREQUENCY: 2x/week  PT DURATION: 8 weeks + 12 weeks (recert-plan for 2x/week for 8 weeks but patient taking a one month break to figure out transportation) + 4 weeks (recert)  PLANNED INTERVENTIONS: Therapeutic exercises, Therapeutic activity, Neuromuscular re-education, Balance training, Gait training, Patient/Family education, Self Care, Joint mobilization, Stair training, Vestibular training, Canalith repositioning, Visual/preceptual remediation/compensation, Orthotic/Fit training, DME instructions, Aquatic Therapy, Dry Needling, Electrical stimulation, Cryotherapy, Moist heat, Taping, Manual therapy, and Re-evaluation  PLAN FOR NEXT SESSION:  Tall kneel on mat- side stepping. Floor transfer practice- LLE out of half-kneel; Sit to stands  balance, endurance, gait training with rollator, (Pt's old genu recurvatum brace is in [employer]),  anterior weight shift with sit to stands, squats? Hip flexion and kneel flexion strengthening, AAROM.   Could also try estim or bioness in future sessions (let her know when so she can wear shorts)    Beverely Low, SPT Peter Congo, PT, DPT, CSRS    06/28/2023, 3:32 PM

## 2023-06-28 NOTE — Addendum Note (Signed)
Addended by: Peter Congo on: 06/28/2023 03:35 PM   Modules accepted: Orders

## 2023-06-29 ENCOUNTER — Ambulatory Visit
Admission: RE | Admit: 2023-06-29 | Discharge: 2023-06-29 | Disposition: A | Discharge status: Home / Self Care | Payer: Medicare Other | Source: Ambulatory Visit | Attending: Family Medicine | Admitting: Family Medicine

## 2023-06-29 DIAGNOSIS — Z1231 Encounter for screening mammogram for malignant neoplasm of breast: Secondary | ICD-10-CM

## 2023-07-05 ENCOUNTER — Ambulatory Visit: Payer: Medicare Other | Admitting: Physical Therapy

## 2023-07-05 DIAGNOSIS — M6281 Muscle weakness (generalized): Secondary | ICD-10-CM

## 2023-07-05 DIAGNOSIS — M25562 Pain in left knee: Secondary | ICD-10-CM | POA: Diagnosis not present

## 2023-07-05 DIAGNOSIS — R2681 Unsteadiness on feet: Secondary | ICD-10-CM

## 2023-07-05 DIAGNOSIS — R2689 Other abnormalities of gait and mobility: Secondary | ICD-10-CM | POA: Diagnosis not present

## 2023-07-05 DIAGNOSIS — R293 Abnormal posture: Secondary | ICD-10-CM | POA: Diagnosis not present

## 2023-07-05 DIAGNOSIS — R278 Other lack of coordination: Secondary | ICD-10-CM | POA: Diagnosis not present

## 2023-07-05 NOTE — Therapy (Addendum)
OUTPATIENT PHYSICAL THERAPY NEURO TREATMENT - DISCHARGE SUMMARY   Patient Name: Selena Bender MRN: [id number] DOB:Mar 22, 1951, 72 y.o., female Today's Date: 07/05/2023   PCP: Bethanie Dicker, NP REFERRING PROVIDER: Bethanie Dicker, NP  PHYSICAL THERAPY DISCHARGE SUMMARY  Visits from Start of Care: 22  Current functional level related to goals / functional outcomes: Pt requires rollator w/ SBA for safety w/all ADLs    Remaining deficits: Decreased functional strength, decreased activity tolerance, high fall risk   Education / Equipment: HEP   Patient agrees to discharge. Patient goals were not met. Patient is being discharged due to  lack of transportation.   END OF SESSION:  PT End of Session - 07/05/23 1022     Visit Number 22    Number of Visits 30   recert   Date for PT Re-Evaluation 07/26/23   recert   Authorization Type Medicare    Progress Note Due on Visit 20    PT Start Time 1019    PT Stop Time 1048   DC   PT Time Calculation (min) 29 min    Equipment Utilized During Treatment Gait belt    Activity Tolerance Patient tolerated treatment well    Behavior During Therapy WFL for tasks assessed/performed               Past Medical History:  Diagnosis Date   Asthma    Breast cancer (HCC) 2014   Right   Chicken pox    CKD (chronic kidney disease), stage III (HCC)    COVID-19 virus infection 12/2020   Hypertension    Multiple sclerosis (HCC)    Seizures (HCC)    Past Surgical History:  Procedure Laterality Date   BREAST SURGERY     MASTECTOMY Right 2014   Patient Active Problem List   Diagnosis Date Noted   Spinal cord disease (HCC) 02/01/2023   Abnormal MRI, cervical spine (06/06/2020 & 11/13/2022) 02/01/2023   Demyelinating disease of central nervous system (HCC) 02/01/2023   Chronic radicular pain of lower extremity (S1) (Bilateral) 02/01/2023   Lower extremity burning pain 02/01/2023   Chronic low back pain (2ry area of Pain) (Bilateral)  (R>L) w/ sciatica (Bilateral) 02/01/2023   Chronic hip pain (Bilateral) (L>R) 02/01/2023   Chronic upper extremity pain (intermittent) (Bilateral) 02/01/2023   Allergic rhinitis 01/14/2023   CKD (chronic kidney disease), stage III (HCC)    COVID-19 virus infection 12/2020   Myelopathy (HCC) 09/25/2020   History of right breast cancer 03/13/2020   Status post right mastectomy 03/13/2020   Abnormal gait 09/15/2018   Claustrophobia 09/15/2018   Hypertension 09/15/2018   Focal epilepsy (HCC) 09/15/2018   Multiple sclerosis (HCC) 09/15/2018   Paresthesia 09/15/2018   Chronic lower extremity pain (1ry area of Pain) (Bilateral) (R>L) 12/31/2015   Seizure (HCC) 12/31/2015   Neuropathic pain 06/02/2015   No advance directives 11/02/2012   Refusal of blood transfusion for reasons of conscience 11/02/2012   Malignant neoplasm of upper-outer quadrant of right breast in female, estrogen receptor positive (HCC) 11/01/2012    ONSET DATE: 01/14/2023 (referral date)  REFERRING DIAG: G35 (ICD-10-CM) - Multiple sclerosis (HCC) R26.9 (ICD-10-CM) - Abnormal gait M62.81 (ICD-10-CM) - Generalized muscle weakness  THERAPY DIAG:  Muscle weakness (generalized)  Unsteadiness on feet  Other abnormalities of gait and mobility  Rationale for Evaluation and Treatment: Rehabilitation  SUBJECTIVE:  SUBJECTIVE STATEMENT:  Pt ambulated into clinic w/cane. Reports her friend brought her today. Very achy today. Feels ready to DC today due to transportation issues. Has her Access [place] paperwork filled out but has not mailed it yet.   Pt accompanied by: self   PERTINENT HISTORY: relapsing remitting MS and breast CA and seizures  PAIN:   Are you having pain? Yes: NPRS scale: 7/10 Pain location: legs, hips down into  thighs Pain description: pins and needles, burning sensation Aggravating factors: "nothing I can think of right away" Relieving factors: pain medication, movement  Took pain medicine before she came today.  PRECAUTIONS: Fall  WEIGHT BEARING RESTRICTIONS: No  FALLS: Has patient fallen in last 6 months? No, several near falls though  LIVING ENVIRONMENT: Lives with: lives with their daughter [name] Lives in: House/apartment Stairs: No Has following equipment at home: Counselling psychologist, Environmental consultant - 4 wheeled, and shower chair *needs new rollator and needs a BSC  PLOF: Independent with basic ADLs, Independent with gait, Independent with transfers, Requires assistive device for independence, and Needs assistance with homemaking  PATIENT GOALS: "to help me move around a little bit better and improve my balance, less pain"  OBJECTIVE:   DIAGNOSTIC FINDINGS:  B hip xrays 02/01/23: FINDINGS: SI joints are patent. Pubic symphysis and rami appear intact. Mild bilateral hip degenerative changes with minimal spurring and joint space narrowing   IMPRESSION: Mild bilateral hip degenerative changes.  Lumbar spine xray 02/01/23: FINDINGS: Five non rib-bearing lumbar type vertebra. Lumbar alignment within normal limits. Vertebral body heights are maintained. No suspicious change in alignment with flexion or extension. Mild disc space narrowing L2-L3 and L4-L5. Moderate facet degenerative changes worst at L4-L5 and L5-S1.   IMPRESSION: Multilevel degenerative changes as described above.  COGNITION: Overall cognitive status: Impaired "I'm not as sharp as I used to be"   SENSATION: Decreased light touch in R lateral knee  EDEMA:  Swelling in L knee most of the time, occasionally in L ankle  POSTURE: rounded shoulders, forward head, and posterior pelvic tilt  LOWER EXTREMITY ROM:     Passive  Right Eval Left Eval  Hip flexion   [facility]   [facility]  Hip extension    Hip abduction     Hip adduction    Hip internal rotation    Hip external rotation    Knee flexion    Knee extension    Ankle dorsiflexion    Ankle plantarflexion    Ankle inversion    Ankle eversion     (Blank rows = not tested)  LOWER EXTREMITY MMT:    MMT Right Eval Left Eval  Hip flexion 5 2-  Hip extension    Hip abduction    Hip adduction    Hip internal rotation    Hip external rotation    Knee flexion 5 2-  Knee extension 5 3  Ankle dorsiflexion 4 3  Ankle plantarflexion    Ankle inversion    Ankle eversion    (Blank rows = not tested)   TODAY'S TREATMENT:         Ther Act    LTG Assessment   [facility] PT Assessment - 07/05/23 1027       Transfers   Five time sit to stand comments  23.59s w/BUE support   LLE in kickstand position     Ambulation/Gait   Gait velocity 32.8' over 34.69s = 0.95 ft/s   w/rollator  Did not attempt floor transfer this date due to pt's high pain levels, but pt able to teach back proper technique (getting into quadruped, crawling to a piece of furniture, tall kneel > half kneel > stand).   Ther Ex  SciFit multi-peaks level 6 for 8 minutes using BUE/BLEs for neural priming for reciprocal movement, dynamic cardiovascular warmup and increased amplitude of stepping.   Gait pattern: step through pattern, decreased step length- Left, decreased stride length, decreased hip/knee flexion- Left, decreased ankle dorsiflexion- Left, antalgic, lateral hip instability, wide BOS, and poor foot clearance- Left Distance walked: Various clinic distances  Assistive device utilized: Walker - 4 wheeled and Hurrycane Level of assistance: SBA and CGA Comments: Pt ambulated into clinic w/cane and used rollator throughout session. Pt required CGA w/cane and SBA w/rollator due to posterior instability and weakness.                                                                     PATIENT EDUCATION:  Education details: Goal outcomes, plan to return to PT in a  few months, importance of using rollator at all times  Person educated: Patient Education method: Explanation Education comprehension: verbalized understanding  HOME EXERCISE PROGRAM:  From prior POC: Access Code: [email or web address] URL: [email or web address] Date: 02/15/2023 Prepared by: Peter Congo  Exercises - Sit to Stand Without Arm Support  - 1 x daily - 7 x weekly - 1 sets - 10 reps - Standing March with Counter Support  - 1 x daily - 7 x weekly - 1 sets - 10 reps - Standing Hip Abduction with Counter Support  - 1 x daily - 7 x weekly - 1 sets - 10 reps - Standing Hip Extension with Counter Support  - 1 x daily - 7 x weekly - 1 sets - 10 reps - Seated Ankle Pumps  - 1 x daily - 7 x weekly - 3 sets - 10 reps - Supine March  - 1 x daily - 7 x weekly - 3 sets - 5-10 reps - Supine Bridge  - 1 x daily - 7 x weekly - 1 sets - 10 reps - Seated Knee Flexion  - 1 x daily - 7 x weekly - 3 sets - 10 reps - Supine Heel Slide with Strap  - 1 x daily - 7 x weekly - 3 sets - 10 reps    GOALS: Goals reviewed with patient? Yes   NEW SHORT TERM GOALS:   Target date: 05/05/2023   Pt will improve 5 x STS to less than or equal to 20 seconds to demonstrate improved functional strength and transfer efficiency.  Baseline: 21.15 sec with heavy UE reliance on arms of chair (6/27), 19.34 sec with BUE support on arms of chair (7/30), 25.44 sec with BUE support on arms of chair (8/29); 29.19s w/BUE support and heavy retropulsion (10/3) Goal status: NOT MET  2.  Pt will improve gait velocity to at least 1.25 ft/sec for improved gait efficiency and performance at SBA level  Baseline: 0.83 ft/sec with SBQC (6/27), 1.13 ft/sec with SBQC (7/30), 0.99 ft/sec with SBQC (8/29); 0.91 ft/s w/SBQC (10/3) Goal status: NOT MET  3. Floor transfer to be assessed and LTG set Baseline: not assessed Goal status: INITIAL  NEW LONG TERM GOALS:  Target date: 06/30/2023 (updated to match last scheduled  appointment within cert. date)  Pt will improve 5 x STS to less than or equal to 22 seconds to demonstrate improved functional strength and transfer efficiency.  Baseline: 29.19s w/BUE support and heavy retropulsion; 19.06 seconds w/ heavy reliance on BUE support Goal status: MET  2.  Pt will improve gait velocity to at least 1.2 ft/sec for improved gait efficiency and performance at SBA level  Baseline: 0.91 ft/s w/SBQC (10/3) Goal status: REVISED   3.  Pt will demonstrate knowledge of fall recovery safety and perform fall recovery strategies at a Mod-I level to demonstrate ability to safely recover from a fall without physical assistance. Baseline: Min A w/ LLE out of L half-kneel Goal status: IN PROGRESS  NEW NEW LONG TERM GOALS:  Target date: 07/26/2023  1.  Pt will improve gait velocity to at least 1.2 ft/sec for improved gait efficiency and performance at SBA level  Baseline: 0.91 ft/s w/SBQC (10/3); 0.95 ft/s w/rollator  Goal status: NOT MET   2.  Pt will demonstrate knowledge of fall recovery safety and perform fall recovery strategies at a Mod-I level to demonstrate ability to safely recover from a fall without physical assistance. Baseline: Min A w/ LLE out of L half-kneel Goal status: NOT MET    ASSESSMENT:  CLINICAL IMPRESSION:  Emphasis of skilled PT session on LTG assessment and DC from PT. Pt did not meet any of her LTGs, but session limited as pt very fatigued and having more pain. Pt did improve her gait speed to 0.95 ft/s w/rollator, but not quite to goal level. Pt unable to perform floor transfer this date due to pain but was able to teach back proper technique to therapist. Pt would require min A to perform transfer due to LLE weakness and extensor tone. Pt in agreement to DC from PT this date as she no longer has transportation due to daughter's injury and would like to take a break from therapy until the new year. Pt continues to require rollator at all times for  safety and remains a high fall risk.     OBJECTIVE IMPAIRMENTS: Abnormal gait, cardiopulmonary status limiting activity, decreased activity tolerance, decreased balance, decreased endurance, decreased mobility, difficulty walking, decreased strength, increased edema, impaired sensation, impaired UE functional use, and pain.   ACTIVITY LIMITATIONS: carrying, lifting, bending, standing, squatting, stairs, transfers, and bed mobility  PARTICIPATION LIMITATIONS: meal prep, cleaning, laundry, driving, and community activity  PERSONAL FACTORS: Age, Sex, Time since onset of injury/illness/exacerbation, Transportation, and 1-2 comorbidities: RRMS, cancer, seizures  are also affecting patient's functional outcome.   REHAB POTENTIAL: Fair time since onset of MS, previous bouts of therapy  CLINICAL DECISION MAKING: Stable/uncomplicated  EVALUATION COMPLEXITY: Moderate  PLAN:  PT FREQUENCY: 2x/week  PT DURATION: 8 weeks + 12 weeks (recert-plan for 2x/week for 8 weeks but patient taking a one month break to figure out transportation) + 4 weeks (recert)  PLANNED INTERVENTIONS: Therapeutic exercises, Therapeutic activity, Neuromuscular re-education, Balance training, Gait training, Patient/Family education, Self Care, Joint mobilization, Stair training, Vestibular training, Canalith repositioning, Visual/preceptual remediation/compensation, Orthotic/Fit training, DME instructions, Aquatic Therapy, Dry Needling, Electrical stimulation, Cryotherapy, Moist heat, Taping, Manual therapy, and Re-evaluation   Annali Lybrand E Dawnelle Warman, PT, DPT  07/05/2023, 11:04 AM

## 2023-07-12 ENCOUNTER — Ambulatory Visit: Payer: Medicare Other | Admitting: Physical Therapy

## 2023-07-19 ENCOUNTER — Ambulatory Visit: Payer: Medicare Other | Admitting: Nurse Practitioner

## 2023-07-27 ENCOUNTER — Encounter
Payer: Medicare Other | Attending: Physical Medicine and Rehabilitation | Admitting: Physical Medicine and Rehabilitation

## 2023-07-27 ENCOUNTER — Encounter: Payer: Self-pay | Admitting: Physical Medicine and Rehabilitation

## 2023-07-27 VITALS — BP 131/78 | HR 91 | Ht 65.0 in | Wt 148.0 lb

## 2023-07-27 DIAGNOSIS — Z79891 Long term (current) use of opiate analgesic: Secondary | ICD-10-CM | POA: Insufficient documentation

## 2023-07-27 DIAGNOSIS — Z5181 Encounter for therapeutic drug level monitoring: Secondary | ICD-10-CM | POA: Insufficient documentation

## 2023-07-27 DIAGNOSIS — M5441 Lumbago with sciatica, right side: Secondary | ICD-10-CM | POA: Diagnosis not present

## 2023-07-27 DIAGNOSIS — G894 Chronic pain syndrome: Secondary | ICD-10-CM | POA: Insufficient documentation

## 2023-07-27 DIAGNOSIS — G8929 Other chronic pain: Secondary | ICD-10-CM | POA: Diagnosis not present

## 2023-07-27 DIAGNOSIS — G35 Multiple sclerosis: Secondary | ICD-10-CM | POA: Diagnosis not present

## 2023-07-27 DIAGNOSIS — M5442 Lumbago with sciatica, left side: Secondary | ICD-10-CM | POA: Insufficient documentation

## 2023-07-27 DIAGNOSIS — M792 Neuralgia and neuritis, unspecified: Secondary | ICD-10-CM | POA: Insufficient documentation

## 2023-07-27 MED ORDER — TRAMADOL HCL 50 MG PO TABS: 50.0000 mg | ORAL_TABLET | Freq: Two times a day (BID) | ORAL | 5 refills | Status: DC | PRN

## 2023-07-27 MED ORDER — HYDROCODONE-ACETAMINOPHEN 10-325 MG PO TABS: 1.0000 | ORAL_TABLET | Freq: Four times a day (QID) | ORAL | 0 refills | Status: DC | PRN

## 2023-07-27 NOTE — Addendum Note (Signed)
Addended by: Doreene Eland on: 07/27/2023 12:19 PM   Modules accepted: Orders

## 2023-07-27 NOTE — Patient Instructions (Signed)
Pt is a 73 yr old R handed female with hx of relapsing remitting MS and breast CA and seizures here for evaluation of chronic pain in B/L arms and chest due to mastectomy- dx'd 2014- and in remission.  A lot of nerve pain, likely from MS.  Here for f/u on chronic pain/MS.    Since pain meds helping ~ 25% -will try to Increase Norco to 10/325 mg up to 3x/day as needed- usually taking 1-2 tabs/day.    2. Takes Tramadol sometimes at night-  up to 2x/day-  will give 60 tabs with 5 refils- sent in   3. Needs Oral drug screen due to MS- needs oral today- per clinic policy   4. F/U in 2months with Eunice and 4months with me.

## 2023-07-27 NOTE — Progress Notes (Signed)
Subjective:    Patient ID: Selena Bender, female    DOB: 1951-07-01, 72 y.o.   MRN: [id number]  HPI   Pt is a 72 yr old R handed female with hx of relapsing remitting MS and breast CA and seizures here for evaluation of chronic pain in B/L arms and chest due to mastectomy- dx'd 2014- and in remission.  A lot of nerve pain, likely from MS.  Here for f/u on chronic pain/MS.     Got increase in norco at last appointment-  better than Norco 5 mg-   Definitely helps-  helps 25% in pain.   Just takes 1-2x/day.     Daughter tore achilles tendon and needs surgery, but couldn't come today Pain Inventory Average Pain 7 Pain Right Now 5 My pain is burning and tingling  In the last 24 hours, has pain interfered with the following? General activity 5 Relation with others 5 Enjoyment of life 5 What TIME of day is your pain at its worst? daytime Sleep (in general) Good  Pain is worse with: inactivity Pain improves with: therapy/exercise and medication Relief from Meds: 8  Family History  Problem Relation Age of Onset   Sickle cell anemia Father    Breast cancer Neg Hx    Social History   Socioeconomic History   Marital status: Widowed    Spouse name: Not on file   Number of children: 2   Years of education: Not on file   Highest education level: Not on file  Occupational History   Not on file  Tobacco Use   Smoking status: Former    Types: Cigarettes   Smokeless tobacco: Never  Vaping Use   Vaping status: Never Used  Substance and Sexual Activity   Alcohol use: Yes    Comment: occ   Drug use: Not Currently    Types: Marijuana    Comment: Gummies   Sexual activity: Not Currently  Other Topics Concern   Not on file  Social History Narrative   Not on file   Social Drivers of Health   Financial Resource Strain: Not on file  Food Insecurity: Not on file  Transportation Needs: Not on file  Physical Activity: Not on file  Stress: Not on file  Social  Connections: Not on file   Past Surgical History:  Procedure Laterality Date   BREAST SURGERY     MASTECTOMY Right 2014   Past Surgical History:  Procedure Laterality Date   BREAST SURGERY     MASTECTOMY Right 2014   Past Medical History:  Diagnosis Date   Asthma    Breast cancer (HCC) 2014   Right   Chicken pox    CKD (chronic kidney disease), stage III (HCC)    COVID-19 virus infection 12/2020   Hypertension    Multiple sclerosis (HCC)    Seizures (HCC)    BP 131/78   Pulse 91   Ht 5\' 5"  (1.651 m)   Wt 148 lb (67.1 kg)   SpO2 96%   BMI 24.63 kg/m   Opioid Risk Score:   Fall Risk Score:  `1  Depression screen [facility] 2/9     07/27/2023   10:59 AM 04/25/2023   11:20 AM 01/26/2023    9:47 AM 01/14/2023    1:18 PM 09/03/2022   10:54 AM 06/02/2022   11:17 AM 02/05/2022    1:42 PM  Depression screen PHQ 2/9  Decreased Interest 2 2 2 2  0 0 1  Down,  Depressed, Hopeless 2 2 1 2  0 0 1  PHQ - 2 Score 4 4 3 4  0 0 2  Altered sleeping    2     Tired, decreased energy    3     Change in appetite    3     Feeling bad or failure about yourself     0     Trouble concentrating    1     Moving slowly or fidgety/restless    0     Suicidal thoughts    0     PHQ-9 Score    13     Difficult doing work/chores    Very difficult         Review of Systems  Constitutional: Negative.   HENT: Negative.    Eyes: Negative.   Respiratory: Negative.    Cardiovascular: Negative.   Gastrointestinal: Negative.   Endocrine: Negative.   Genitourinary: Negative.   Musculoskeletal:  Positive for gait problem.       Leg pain  Skin: Negative.   Allergic/Immunologic: Negative.   Hematological: Negative.   Psychiatric/Behavioral: Negative.    All other systems reviewed and are negative.     Objective:   Physical Exam  Sitting on table- cousin here with her Leans slightly to left And has dysconjugate gaze       Assessment & Plan:    Pt is a 72 yr old R handed female with hx of  relapsing remitting MS and breast CA and seizures here for evaluation of chronic pain in B/L arms and chest due to mastectomy- dx'd 2014- and in remission.  A lot of nerve pain, likely from MS.  Here for f/u on chronic pain/MS.    Since pain meds helping ~ 25% -will try to Increase Norco to 10/325 mg up to 3x/day as needed- usually taking 1-2 tabs/day.    2. Takes Tramadol sometimes at night-  up to 2x/day-  will give 60 tabs with 5 refils- sent in   3. Needs Oral drug screen due to MS- needs oral today- per clinic policy   4. F/U in 2 months with Riley Lam and 4 months with me.    I spent a total of 20   minutes on total care today- >50% coordination of care- due to d/w pt about oral drug screen which she didn't remember doing as well as pain meds that we increased and new policy of being seen q2 months.

## 2023-08-02 LAB — DRUG TOX MONITOR 1 W/CONF, ORAL FLD
Amphetamines: NEGATIVE ng/mL (ref <10)
Barbiturates: NEGATIVE ng/mL (ref <10)
Benzodiazepines: NEGATIVE ng/mL (ref <0.50)
Buprenorphine: NEGATIVE ng/mL (ref <0.10)
Cocaine: NEGATIVE ng/mL (ref <5.0)
Codeine: NEGATIVE ng/mL (ref <2.5)
Dihydrocodeine: 22 ng/mL — ABNORMAL HIGH (ref <2.5)
Fentanyl: NEGATIVE ng/mL (ref <0.10)
Heroin Metabolite: NEGATIVE ng/mL (ref <1.0)
Hydrocodone: >250 ng/mL — ABNORMAL HIGH (ref <2.5)
Hydromorphone: NEGATIVE ng/mL (ref <2.5)
MARIJUANA: NEGATIVE ng/mL (ref <2.5)
MDMA: NEGATIVE ng/mL (ref <10)
Meprobamate: NEGATIVE ng/mL (ref <2.5)
Methadone: NEGATIVE ng/mL (ref <5.0)
Morphine: NEGATIVE ng/mL (ref <2.5)
Nicotine Metabolite: NEGATIVE ng/mL (ref <5.0)
Norhydrocodone: 39.1 ng/mL — ABNORMAL HIGH (ref <2.5)
Noroxycodone: NEGATIVE ng/mL (ref <2.5)
Opiates: POSITIVE ng/mL — AB (ref <2.5)
Oxycodone: NEGATIVE ng/mL (ref <2.5)
Oxymorphone: NEGATIVE ng/mL (ref <2.5)
Phencyclidine: NEGATIVE ng/mL (ref <10)
Tapentadol: NEGATIVE ng/mL (ref <5.0)
Tramadol: >500 ng/mL — ABNORMAL HIGH (ref <5.0)
Tramadol: POSITIVE ng/mL — AB (ref <5.0)
Zolpidem: NEGATIVE ng/mL (ref <5.0)

## 2023-08-02 LAB — DRUG TOX ALC METAB W/CON, ORAL FLD: Alcohol Metabolite: NEGATIVE ng/mL (ref <25)

## 2023-08-16 DIAGNOSIS — I10 Essential (primary) hypertension: Secondary | ICD-10-CM | POA: Diagnosis not present

## 2023-08-16 DIAGNOSIS — N1832 Chronic kidney disease, stage 3b: Secondary | ICD-10-CM | POA: Diagnosis not present

## 2023-08-16 DIAGNOSIS — Z9989 Dependence on other enabling machines and devices: Secondary | ICD-10-CM | POA: Diagnosis not present

## 2023-08-16 DIAGNOSIS — G35 Multiple sclerosis: Secondary | ICD-10-CM | POA: Diagnosis not present

## 2023-08-16 DIAGNOSIS — E7800 Pure hypercholesterolemia, unspecified: Secondary | ICD-10-CM | POA: Diagnosis not present

## 2023-09-02 ENCOUNTER — Telehealth: Payer: Self-pay

## 2023-09-02 MED ORDER — HYDROCODONE-ACETAMINOPHEN 10-325 MG PO TABS: 1.0000 | ORAL_TABLET | Freq: Four times a day (QID) | ORAL | 0 refills | Status: DC | PRN

## 2023-09-02 NOTE — Telephone Encounter (Signed)
Patient requested refill for Hydrocodone be sent to [facility] on [address] last fill per pmp:  Filled  Written  ID  Drug  QTY  Days  Prescriber  RX #  Dispenser  Refill  Daily Dose*  Pymt Type  PMP  07/27/2023 07/27/2023 3  Hydrocodone-Acetamin 10-325 Mg 30.00 7 Me Lov [id number] Nor (4142) 0/0 42.86 MME Comm Ins [place]

## 2023-09-26 DIAGNOSIS — H18513 Endothelial corneal dystrophy, bilateral: Secondary | ICD-10-CM | POA: Diagnosis not present

## 2023-09-26 DIAGNOSIS — B351 Tinea unguium: Secondary | ICD-10-CM | POA: Diagnosis not present

## 2023-09-26 DIAGNOSIS — H25813 Combined forms of age-related cataract, bilateral: Secondary | ICD-10-CM | POA: Diagnosis not present

## 2023-09-26 DIAGNOSIS — L608 Other nail disorders: Secondary | ICD-10-CM | POA: Diagnosis not present

## 2023-09-26 DIAGNOSIS — H50112 Monocular exotropia, left eye: Secondary | ICD-10-CM | POA: Diagnosis not present

## 2023-09-27 ENCOUNTER — Encounter: Payer: Self-pay | Admitting: Registered Nurse

## 2023-09-27 ENCOUNTER — Encounter: Payer: Medicare Other | Attending: Physical Medicine and Rehabilitation | Admitting: Registered Nurse

## 2023-09-27 VITALS — BP 138/87 | HR 83 | Ht 65.0 in | Wt 147.6 lb

## 2023-09-27 DIAGNOSIS — G629 Polyneuropathy, unspecified: Secondary | ICD-10-CM

## 2023-09-27 DIAGNOSIS — Z5181 Encounter for therapeutic drug level monitoring: Secondary | ICD-10-CM | POA: Diagnosis not present

## 2023-09-27 DIAGNOSIS — Z79891 Long term (current) use of opiate analgesic: Secondary | ICD-10-CM

## 2023-09-27 DIAGNOSIS — G894 Chronic pain syndrome: Secondary | ICD-10-CM | POA: Diagnosis not present

## 2023-09-27 NOTE — Progress Notes (Unsigned)
Virtual Visit via Video Note  Consent was obtained for video visit:  Yes.   Answered questions that patient had about telehealth interaction:  Yes.   I discussed the limitations, risks, security and privacy concerns of performing an evaluation and management service by telemedicine. I also discussed with the patient that there may be a patient responsible charge related to this service. The patient expressed understanding and agreed to proceed.  Pt location: Home Physician Location: office Name of referring provider:  Shon Hale, * I connected with Selena Bender at patients initiation/request on 09/28/2023 at 10:30 AM EST by video enabled telemedicine application and verified that I am speaking with the correct person using two identifiers. Pt MRN:  [id number] Pt DOB:  [birth year] Video Participants:  Selena Bender  Assessment/Plan:   1.  Multiple sclerosis 2.  Focal onset seizures with impaired consciousness, stable 3.  Cervical spinal stenosis.  There is some cord mass effect at C4-5 level.  Didn't see neurosurgery.  Denies any progression of symptoms over past year.  Unlikely contributing to her symptoms.  Will continue to monitor.    1.  Seizure prophylaxis: Lamotrigine 50mg  twice daily 3.  D3 4000 IU daily 4.  Follow up in one year or sooner if needed.  Subjective:  Selena Bender is a 73year old right-handed black female with hypertension and history of breast cancer who follows up for multiple sclerosis and seizure.  MRIs from April personally reviewed.   UPDATE: Current disease modifying therapy:  None Current seizure prophylaxis: lamotrigine 50mg  twice daily Current medications:  vitamin D 4000 IU daily  Restarted lamotrigine.  No seizures.  11/13/2022 MRI BRAIN & C-SPINE W WO:  1. Similar sequela of chronic demyelination intracranially and in the cervical spine. No new or enhancing lesions identified. 2. Similar multifocal degenerative  change including severe bilateral foraminal stenosis C4-C5 and C5-C6 with posterior disc osteophyte complexes causing mass effect on the cord at these levels.  09/27/2022 Vit D 93.88      Vision:  OK.  Dysconjugate gaze. Motor:  Left foot weakness.  Sometimes her left hand cramps and closes, toes on left foot cramp. She reports that when she stands, her left knee "goes back", causing balance problems.  No pain Sensory:  She has neuropathy involving the right leg.   Pain:  Worsening burning pain in the legs from the hips down.  No back pain.  Gabapentin ineffective.  Followed by pain management.  Taking tramadol.  Prescribed methadone but hasn't picked it up.   Gait:  Unsteady.  Tendency to lean backwards.  Left foot drags.  Poor balance Bowel/Bladder:  No issues. Fatigue:  Yes but manageable. Cognition:  Once in awhile she may have word-finding difficulty. Mood:  Some depression from time to time.  Not chronic.     HISTORY: Multiple Sclerosis: She had an initial flare up when she was in her 30s.  She had an episode of numbness from the waist down and difficulty with fine-finger movement, lasting 3-4 months.  After several years, she then started dragging her left foot.  She was formally diagnosed with multiple sclerosis in 2005 after MRI and CSF analysis.  She was started on Copaxone but stopped about 2 years ago.  She has not had any relapses but has had a gradual decline over the last several years, primary affecting her balance and gait.     Past disease modifying therapy:  Copaxone (stopped in 2017- 2018 because she no  longer had a neurologist)  Past pain medications:  gabapentin, pregablin, duloxetine    Seizure Disorder: She also has a seizure disorder, possibly secondary to MS.  She has had a total of 3 seizures.  Last seizure was in the mid-90s.   Past anti-epileptic medications:  Dilantin   Imaging: 06/06/2020 MRI BRAIN W WO:  Stable 06/06/2020 MRI CERVICAL W WO:  multifocal  MS cord lesions with similar multilevel degenerative changes causing similar mass effect on the left eccentric cord at C4-5 and C5-6 with severe bilateral foraminal stenosis.  05/11/2019 MRI BRAIN W WO:  Advanced chronic demyelinating disease involving the cerebral white matter and cortex as as well as suspected superimposed chronic small vessel ischemia particularly involving the right caudate and cerebellum. 05/11/2019 MRI CERVICAL W WO:  Chronic multifocal patchy demyelination involving the spinal cord and cervicomedullary junction, with severe involvement of left hemicord at C4 level.  Degenerative cervical spine disease with moderate spinal stenosis causing mass effect on left hemicord at C4-5 with moderate to severe bilateral foraminal stenosis; lesser spinal stenosis at C6-7; moderate to severe bilateral foraminal stenosis at C6-7 and C7-T1.   Past Medical History: Past Medical History:  Diagnosis Date   Asthma    Breast cancer (HCC) 2014   Right   Chicken pox    CKD (chronic kidney disease), stage III (HCC)    COVID-19 virus infection 12/2020   Hypertension    Multiple sclerosis (HCC)    Seizures (HCC)     Medications: Outpatient Encounter Medications as of 09/28/2023  Medication Sig   albuterol (VENTOLIN HFA) 108 (90 Base) MCG/ACT inhaler INHALE 1 TO 2 PUFFS EVERY 4 HOURS AS NEEDED   ascorbic acid (VITAMIN C) 500 MG/5ML syrup Take by mouth daily.   cetirizine (ZYRTEC) 10 MG tablet Take 1 tablet (10 mg total) by mouth daily.   cholecalciferol (VITAMIN D) 25 MCG (1000 UNIT) tablet Take 4 tablets (4,000 Units total) by mouth daily.   fluticasone (FLONASE) 50 MCG/ACT nasal spray Place 1 spray into both nostrils at bedtime.   HYDROcodone-acetaminophen (NORCO) 10-325 MG tablet Take 1 tablet by mouth every 6 (six) hours as needed.   lamoTRIgine (LAMICTAL) 25 MG tablet 2 tablets twice daily.   losartan (COZAAR) 50 MG tablet Take 50 mg by mouth daily.   traMADol (ULTRAM) 50 MG tablet Take  1 tablet (50 mg total) by mouth every 12 (twelve) hours as needed.   No facility-administered encounter medications on file as of 09/28/2023.    Allergies: No Known Allergies  Family History: Family History  Problem Relation Age of Onset   Sickle cell anemia Father    Breast cancer Neg Hx     Observations/Objective:   No acute distress.  Alert and oriented.  Speech fluent and not dysarthric.  Language intact.     Follow Up Instructions:    -I discussed the assessment and treatment plan with the patient. The patient was provided an opportunity to ask questions and all were answered. The patient agreed with the plan and demonstrated an understanding of the instructions.   The patient was advised to call back or seek an in-person evaluation if the symptoms worsen or if the condition fails to improve as anticipated.   Cira Servant, DO

## 2023-09-27 NOTE — Progress Notes (Signed)
 Subjective:    Patient ID: Selena Bender, female    DOB: 03/09/1951, 73 y.o.   MRN: [id number]  HPI: Selena Bender Selena Bender is a 73 y.o. female who returns for follow up appointment for chronic pain and medication refill. She states her pain is located in  her bilateral lower extremities with tingling and burning. She rates her pain 6. Her current exercise regime is walking and performing stretching exercises.  Selena Bender Morphine equivalent is 60.00 MME.   Last Oral Swab was Performed on 07/27/2023, it was consistent.    Pain Inventory Average Pain 8 Pain Right Now 6 My pain is intermittent and burning  In the last 24 hours, has pain interfered with the following? General activity 5 Relation with others 5 Enjoyment of life 5 What TIME of day is your pain at its worst? daytime Sleep (in general) Good  Pain is worse with: some activites Pain improves with: rest and medication Relief from Meds: 8  Family History  Problem Relation Age of Onset   Sickle cell anemia Father    Breast cancer Neg Hx    Social History   Socioeconomic History   Marital status: Widowed    Spouse name: Not on file   Number of children: 2   Years of education: Not on file   Highest education level: Not on file  Occupational History   Not on file  Tobacco Use   Smoking status: Former    Types: Cigarettes   Smokeless tobacco: Never  Vaping Use   Vaping status: Never Used  Substance and Sexual Activity   Alcohol use: Yes    Comment: occ   Drug use: Not Currently    Types: Marijuana    Comment: Gummies   Sexual activity: Not Currently  Other Topics Concern   Not on file  Social History Narrative   Not on file   Social Drivers of Health   Financial Resource Strain: Not on file  Food Insecurity: Not on file  Transportation Needs: Not on file  Physical Activity: Not on file  Stress: Not on file  Social Connections: Not on file   Past Surgical History:  Procedure Laterality  Date   BREAST SURGERY     MASTECTOMY Right 2014   Past Surgical History:  Procedure Laterality Date   BREAST SURGERY     MASTECTOMY Right 2014   Past Medical History:  Diagnosis Date   Asthma    Breast cancer (HCC) 2014   Right   Chicken pox    CKD (chronic kidney disease), stage III (HCC)    COVID-19 virus infection 12/2020   Hypertension    Multiple sclerosis (HCC)    Seizures (HCC)    BP (!) 142/85   Pulse 83   Ht 5\' 5"  (1.651 m)   Wt 147 lb 9.6 oz (67 kg)   SpO2 97%   BMI 24.56 kg/m   Opioid Risk Score:   Fall Risk Score:  `1  Depression screen [facility] 2/9     09/27/2023   11:53 AM 07/27/2023   10:59 AM 04/25/2023   11:20 AM 01/26/2023    9:47 AM 01/14/2023    1:18 PM 09/03/2022   10:54 AM 06/02/2022   11:17 AM  Depression screen PHQ 2/9  Decreased Interest 1 2 2 2 2  0 0  Down, Depressed, Hopeless 1 2 2 1 2  0 0  PHQ - 2 Score 2 4 4 3 4  0 0  Altered sleeping  2    Tired, decreased energy     3    Change in appetite     3    Feeling bad or failure about yourself      0    Trouble concentrating     1    Moving slowly or fidgety/restless     0    Suicidal thoughts     0    PHQ-9 Score     13    Difficult doing work/chores     Very difficult        Review of Systems  Musculoskeletal:  Positive for gait problem.       Bilateral legs  Psychiatric/Behavioral:  Positive for dysphoric mood.   All other systems reviewed and are negative.      Objective:   Physical Exam Vitals and nursing note reviewed.  Constitutional:      Appearance: Normal appearance.  Cardiovascular:     Rate and Rhythm: Normal rate and regular rhythm.     Pulses: Normal pulses.     Heart sounds: Normal heart sounds.  Pulmonary:     Effort: Pulmonary effort is normal.     Breath sounds: Normal breath sounds.  Musculoskeletal:     Comments: Normal Muscle Bulk and Muscle Testing Reveals:  Upper Extremities: Full ROM and Muscle Strength on Right 5/5 and Left 4/5   Lower  Extremities: Right: Full ROM and Muscle Strength 5/5 Left Lower Extremity: Full ROM and Muscle Strength 4/5 Arises from Table slowly Narrow Based Gait     Skin:    General: Skin is warm and dry.  Neurological:     Mental Status: She is alert and oriented to person, place, and time.  Psychiatric:        Mood and Affect: Mood normal.        Behavior: Behavior normal.           Assessment & Plan:  Polyneuropathy: Continue current medication regimen. Continue to monitor.  Chronic Pain Syndrome: Continue: Tramadol 50 mg one tablet every 12 hours as needed for pain #60. And : Hydrocodone 10/325 mg one tablet every 6 hours as needed for pain #120.  We will continue the opioid monitoring program, this consists of regular clinic visits, examinations, urine drug screen, pill counts as well as use of [place] Controlled Substance Reporting system. A 12 month History has been reviewed on the [place] Controlled Substance Reporting System on 09/27/2023  F/U in 2 months

## 2023-09-28 ENCOUNTER — Telehealth (INDEPENDENT_AMBULATORY_CARE_PROVIDER_SITE_OTHER): Payer: Medicare Other | Admitting: Neurology

## 2023-09-28 ENCOUNTER — Encounter: Payer: Self-pay | Admitting: Neurology

## 2023-09-28 DIAGNOSIS — M4802 Spinal stenosis, cervical region: Secondary | ICD-10-CM | POA: Diagnosis not present

## 2023-09-28 DIAGNOSIS — G40009 Localization-related (focal) (partial) idiopathic epilepsy and epileptic syndromes with seizures of localized onset, not intractable, without status epilepticus: Secondary | ICD-10-CM | POA: Diagnosis not present

## 2023-09-28 DIAGNOSIS — G35 Multiple sclerosis: Secondary | ICD-10-CM | POA: Diagnosis not present

## 2023-09-28 DIAGNOSIS — E559 Vitamin D deficiency, unspecified: Secondary | ICD-10-CM

## 2023-09-28 NOTE — Patient Instructions (Signed)
 Check labs

## 2023-09-28 NOTE — Addendum Note (Signed)
Addended by: Leida Lauth on: 09/28/2023 10:37 AM   Modules accepted: Orders

## 2023-10-10 ENCOUNTER — Encounter: Payer: Self-pay | Admitting: *Deleted

## 2023-10-11 ENCOUNTER — Other Ambulatory Visit: Payer: Self-pay | Admitting: Physical Medicine and Rehabilitation

## 2023-10-12 ENCOUNTER — Other Ambulatory Visit: Payer: Self-pay | Admitting: Physical Medicine and Rehabilitation

## 2023-10-12 ENCOUNTER — Telehealth: Payer: Self-pay | Admitting: Physical Medicine and Rehabilitation

## 2023-10-12 DIAGNOSIS — H538 Other visual disturbances: Secondary | ICD-10-CM

## 2023-10-12 DIAGNOSIS — G894 Chronic pain syndrome: Secondary | ICD-10-CM

## 2023-10-12 DIAGNOSIS — G35 Multiple sclerosis: Secondary | ICD-10-CM

## 2023-10-12 NOTE — Telephone Encounter (Signed)
 Dannielle Huh patient's son would like a call back wanting a referral for home health for patient.  Please call him at [phone].

## 2023-10-13 NOTE — Telephone Encounter (Signed)
 L/M for son Dannielle Huh- not clear exactly what needing, so cannot order h/H until d/w pt or son.

## 2023-10-18 NOTE — Telephone Encounter (Signed)
 Put in for HHA and nursing, as well and PT and OT For pt due to MS.  She also has visual deficits due to severe cataracts as well as chronic pain as well

## 2023-10-18 NOTE — Addendum Note (Signed)
 Addended by: Genice Rouge on: 10/18/2023 01:40 PM   Modules accepted: Orders

## 2023-10-22 DIAGNOSIS — E785 Hyperlipidemia, unspecified: Secondary | ICD-10-CM | POA: Diagnosis not present

## 2023-10-22 DIAGNOSIS — J45909 Unspecified asthma, uncomplicated: Secondary | ICD-10-CM | POA: Diagnosis not present

## 2023-10-22 DIAGNOSIS — I129 Hypertensive chronic kidney disease with stage 1 through stage 4 chronic kidney disease, or unspecified chronic kidney disease: Secondary | ICD-10-CM | POA: Diagnosis not present

## 2023-10-22 DIAGNOSIS — Z8673 Personal history of transient ischemic attack (TIA), and cerebral infarction without residual deficits: Secondary | ICD-10-CM | POA: Diagnosis not present

## 2023-10-22 DIAGNOSIS — R569 Unspecified convulsions: Secondary | ICD-10-CM | POA: Diagnosis not present

## 2023-10-22 DIAGNOSIS — N183 Chronic kidney disease, stage 3 unspecified: Secondary | ICD-10-CM | POA: Diagnosis not present

## 2023-10-22 DIAGNOSIS — Z79891 Long term (current) use of opiate analgesic: Secondary | ICD-10-CM | POA: Diagnosis not present

## 2023-10-22 DIAGNOSIS — G894 Chronic pain syndrome: Secondary | ICD-10-CM | POA: Diagnosis not present

## 2023-10-22 DIAGNOSIS — Z7951 Long term (current) use of inhaled steroids: Secondary | ICD-10-CM | POA: Diagnosis not present

## 2023-10-22 DIAGNOSIS — Z87891 Personal history of nicotine dependence: Secondary | ICD-10-CM | POA: Diagnosis not present

## 2023-10-22 DIAGNOSIS — Z8616 Personal history of COVID-19: Secondary | ICD-10-CM | POA: Diagnosis not present

## 2023-10-22 DIAGNOSIS — G629 Polyneuropathy, unspecified: Secondary | ICD-10-CM | POA: Diagnosis not present

## 2023-10-22 DIAGNOSIS — G35 Multiple sclerosis: Secondary | ICD-10-CM | POA: Diagnosis not present

## 2023-10-25 DIAGNOSIS — G35 Multiple sclerosis: Secondary | ICD-10-CM | POA: Diagnosis not present

## 2023-10-25 DIAGNOSIS — N1832 Chronic kidney disease, stage 3b: Secondary | ICD-10-CM | POA: Diagnosis not present

## 2023-10-25 DIAGNOSIS — E559 Vitamin D deficiency, unspecified: Secondary | ICD-10-CM | POA: Diagnosis not present

## 2023-10-25 DIAGNOSIS — Z7409 Other reduced mobility: Secondary | ICD-10-CM | POA: Diagnosis not present

## 2023-10-25 DIAGNOSIS — R5381 Other malaise: Secondary | ICD-10-CM | POA: Diagnosis not present

## 2023-10-25 DIAGNOSIS — M21372 Foot drop, left foot: Secondary | ICD-10-CM | POA: Diagnosis not present

## 2023-10-25 DIAGNOSIS — I10 Essential (primary) hypertension: Secondary | ICD-10-CM | POA: Diagnosis not present

## 2023-10-26 DIAGNOSIS — G35 Multiple sclerosis: Secondary | ICD-10-CM | POA: Diagnosis not present

## 2023-10-26 DIAGNOSIS — L601 Onycholysis: Secondary | ICD-10-CM | POA: Diagnosis not present

## 2023-10-26 DIAGNOSIS — G894 Chronic pain syndrome: Secondary | ICD-10-CM | POA: Diagnosis not present

## 2023-10-26 DIAGNOSIS — J45909 Unspecified asthma, uncomplicated: Secondary | ICD-10-CM | POA: Diagnosis not present

## 2023-10-26 DIAGNOSIS — N183 Chronic kidney disease, stage 3 unspecified: Secondary | ICD-10-CM | POA: Diagnosis not present

## 2023-10-26 DIAGNOSIS — I129 Hypertensive chronic kidney disease with stage 1 through stage 4 chronic kidney disease, or unspecified chronic kidney disease: Secondary | ICD-10-CM | POA: Diagnosis not present

## 2023-10-26 DIAGNOSIS — R569 Unspecified convulsions: Secondary | ICD-10-CM | POA: Diagnosis not present

## 2023-10-27 DIAGNOSIS — J45909 Unspecified asthma, uncomplicated: Secondary | ICD-10-CM | POA: Diagnosis not present

## 2023-10-27 DIAGNOSIS — N183 Chronic kidney disease, stage 3 unspecified: Secondary | ICD-10-CM | POA: Diagnosis not present

## 2023-10-27 DIAGNOSIS — G35 Multiple sclerosis: Secondary | ICD-10-CM | POA: Diagnosis not present

## 2023-10-27 DIAGNOSIS — G894 Chronic pain syndrome: Secondary | ICD-10-CM | POA: Diagnosis not present

## 2023-10-27 DIAGNOSIS — I129 Hypertensive chronic kidney disease with stage 1 through stage 4 chronic kidney disease, or unspecified chronic kidney disease: Secondary | ICD-10-CM | POA: Diagnosis not present

## 2023-10-27 DIAGNOSIS — R569 Unspecified convulsions: Secondary | ICD-10-CM | POA: Diagnosis not present

## 2023-10-31 DIAGNOSIS — G894 Chronic pain syndrome: Secondary | ICD-10-CM | POA: Diagnosis not present

## 2023-10-31 DIAGNOSIS — I129 Hypertensive chronic kidney disease with stage 1 through stage 4 chronic kidney disease, or unspecified chronic kidney disease: Secondary | ICD-10-CM | POA: Diagnosis not present

## 2023-10-31 DIAGNOSIS — G35 Multiple sclerosis: Secondary | ICD-10-CM | POA: Diagnosis not present

## 2023-10-31 DIAGNOSIS — R569 Unspecified convulsions: Secondary | ICD-10-CM | POA: Diagnosis not present

## 2023-10-31 DIAGNOSIS — J45909 Unspecified asthma, uncomplicated: Secondary | ICD-10-CM | POA: Diagnosis not present

## 2023-10-31 DIAGNOSIS — N183 Chronic kidney disease, stage 3 unspecified: Secondary | ICD-10-CM | POA: Diagnosis not present

## 2023-11-02 DIAGNOSIS — J45909 Unspecified asthma, uncomplicated: Secondary | ICD-10-CM | POA: Diagnosis not present

## 2023-11-02 DIAGNOSIS — G35 Multiple sclerosis: Secondary | ICD-10-CM | POA: Diagnosis not present

## 2023-11-02 DIAGNOSIS — I129 Hypertensive chronic kidney disease with stage 1 through stage 4 chronic kidney disease, or unspecified chronic kidney disease: Secondary | ICD-10-CM | POA: Diagnosis not present

## 2023-11-02 DIAGNOSIS — R569 Unspecified convulsions: Secondary | ICD-10-CM | POA: Diagnosis not present

## 2023-11-02 DIAGNOSIS — G894 Chronic pain syndrome: Secondary | ICD-10-CM | POA: Diagnosis not present

## 2023-11-02 DIAGNOSIS — N183 Chronic kidney disease, stage 3 unspecified: Secondary | ICD-10-CM | POA: Diagnosis not present

## 2023-11-03 DIAGNOSIS — R569 Unspecified convulsions: Secondary | ICD-10-CM | POA: Diagnosis not present

## 2023-11-03 DIAGNOSIS — N183 Chronic kidney disease, stage 3 unspecified: Secondary | ICD-10-CM | POA: Diagnosis not present

## 2023-11-03 DIAGNOSIS — G894 Chronic pain syndrome: Secondary | ICD-10-CM | POA: Diagnosis not present

## 2023-11-03 DIAGNOSIS — J45909 Unspecified asthma, uncomplicated: Secondary | ICD-10-CM | POA: Diagnosis not present

## 2023-11-03 DIAGNOSIS — I129 Hypertensive chronic kidney disease with stage 1 through stage 4 chronic kidney disease, or unspecified chronic kidney disease: Secondary | ICD-10-CM | POA: Diagnosis not present

## 2023-11-03 DIAGNOSIS — G35 Multiple sclerosis: Secondary | ICD-10-CM | POA: Diagnosis not present

## 2023-11-08 DIAGNOSIS — J45909 Unspecified asthma, uncomplicated: Secondary | ICD-10-CM | POA: Diagnosis not present

## 2023-11-08 DIAGNOSIS — R569 Unspecified convulsions: Secondary | ICD-10-CM | POA: Diagnosis not present

## 2023-11-08 DIAGNOSIS — G35 Multiple sclerosis: Secondary | ICD-10-CM | POA: Diagnosis not present

## 2023-11-08 DIAGNOSIS — I129 Hypertensive chronic kidney disease with stage 1 through stage 4 chronic kidney disease, or unspecified chronic kidney disease: Secondary | ICD-10-CM | POA: Diagnosis not present

## 2023-11-08 DIAGNOSIS — N183 Chronic kidney disease, stage 3 unspecified: Secondary | ICD-10-CM | POA: Diagnosis not present

## 2023-11-08 DIAGNOSIS — G894 Chronic pain syndrome: Secondary | ICD-10-CM | POA: Diagnosis not present

## 2023-11-10 DIAGNOSIS — R569 Unspecified convulsions: Secondary | ICD-10-CM | POA: Diagnosis not present

## 2023-11-10 DIAGNOSIS — N183 Chronic kidney disease, stage 3 unspecified: Secondary | ICD-10-CM | POA: Diagnosis not present

## 2023-11-10 DIAGNOSIS — J45909 Unspecified asthma, uncomplicated: Secondary | ICD-10-CM | POA: Diagnosis not present

## 2023-11-10 DIAGNOSIS — G894 Chronic pain syndrome: Secondary | ICD-10-CM | POA: Diagnosis not present

## 2023-11-10 DIAGNOSIS — I129 Hypertensive chronic kidney disease with stage 1 through stage 4 chronic kidney disease, or unspecified chronic kidney disease: Secondary | ICD-10-CM | POA: Diagnosis not present

## 2023-11-10 DIAGNOSIS — G35 Multiple sclerosis: Secondary | ICD-10-CM | POA: Diagnosis not present

## 2023-11-11 DIAGNOSIS — R569 Unspecified convulsions: Secondary | ICD-10-CM | POA: Diagnosis not present

## 2023-11-11 DIAGNOSIS — G894 Chronic pain syndrome: Secondary | ICD-10-CM | POA: Diagnosis not present

## 2023-11-11 DIAGNOSIS — N183 Chronic kidney disease, stage 3 unspecified: Secondary | ICD-10-CM | POA: Diagnosis not present

## 2023-11-11 DIAGNOSIS — I129 Hypertensive chronic kidney disease with stage 1 through stage 4 chronic kidney disease, or unspecified chronic kidney disease: Secondary | ICD-10-CM | POA: Diagnosis not present

## 2023-11-11 DIAGNOSIS — J45909 Unspecified asthma, uncomplicated: Secondary | ICD-10-CM | POA: Diagnosis not present

## 2023-11-11 DIAGNOSIS — G35 Multiple sclerosis: Secondary | ICD-10-CM | POA: Diagnosis not present

## 2023-11-14 DIAGNOSIS — G894 Chronic pain syndrome: Secondary | ICD-10-CM | POA: Diagnosis not present

## 2023-11-14 DIAGNOSIS — N183 Chronic kidney disease, stage 3 unspecified: Secondary | ICD-10-CM | POA: Diagnosis not present

## 2023-11-14 DIAGNOSIS — G35 Multiple sclerosis: Secondary | ICD-10-CM | POA: Diagnosis not present

## 2023-11-14 DIAGNOSIS — I129 Hypertensive chronic kidney disease with stage 1 through stage 4 chronic kidney disease, or unspecified chronic kidney disease: Secondary | ICD-10-CM | POA: Diagnosis not present

## 2023-11-14 DIAGNOSIS — R569 Unspecified convulsions: Secondary | ICD-10-CM | POA: Diagnosis not present

## 2023-11-14 DIAGNOSIS — J45909 Unspecified asthma, uncomplicated: Secondary | ICD-10-CM | POA: Diagnosis not present

## 2023-11-15 DIAGNOSIS — I129 Hypertensive chronic kidney disease with stage 1 through stage 4 chronic kidney disease, or unspecified chronic kidney disease: Secondary | ICD-10-CM | POA: Diagnosis not present

## 2023-11-15 DIAGNOSIS — D631 Anemia in chronic kidney disease: Secondary | ICD-10-CM | POA: Diagnosis not present

## 2023-11-15 DIAGNOSIS — N1832 Chronic kidney disease, stage 3b: Secondary | ICD-10-CM | POA: Diagnosis not present

## 2023-11-15 DIAGNOSIS — N189 Chronic kidney disease, unspecified: Secondary | ICD-10-CM | POA: Diagnosis not present

## 2023-11-15 DIAGNOSIS — G35 Multiple sclerosis: Secondary | ICD-10-CM | POA: Diagnosis not present

## 2023-11-17 DIAGNOSIS — N183 Chronic kidney disease, stage 3 unspecified: Secondary | ICD-10-CM | POA: Diagnosis not present

## 2023-11-17 DIAGNOSIS — G35 Multiple sclerosis: Secondary | ICD-10-CM | POA: Diagnosis not present

## 2023-11-17 DIAGNOSIS — G894 Chronic pain syndrome: Secondary | ICD-10-CM | POA: Diagnosis not present

## 2023-11-17 DIAGNOSIS — J45909 Unspecified asthma, uncomplicated: Secondary | ICD-10-CM | POA: Diagnosis not present

## 2023-11-17 DIAGNOSIS — I129 Hypertensive chronic kidney disease with stage 1 through stage 4 chronic kidney disease, or unspecified chronic kidney disease: Secondary | ICD-10-CM | POA: Diagnosis not present

## 2023-11-17 DIAGNOSIS — R569 Unspecified convulsions: Secondary | ICD-10-CM | POA: Diagnosis not present

## 2023-11-18 DIAGNOSIS — G894 Chronic pain syndrome: Secondary | ICD-10-CM | POA: Diagnosis not present

## 2023-11-18 DIAGNOSIS — J45909 Unspecified asthma, uncomplicated: Secondary | ICD-10-CM | POA: Diagnosis not present

## 2023-11-18 DIAGNOSIS — N183 Chronic kidney disease, stage 3 unspecified: Secondary | ICD-10-CM | POA: Diagnosis not present

## 2023-11-18 DIAGNOSIS — I129 Hypertensive chronic kidney disease with stage 1 through stage 4 chronic kidney disease, or unspecified chronic kidney disease: Secondary | ICD-10-CM | POA: Diagnosis not present

## 2023-11-18 DIAGNOSIS — G35 Multiple sclerosis: Secondary | ICD-10-CM | POA: Diagnosis not present

## 2023-11-18 DIAGNOSIS — R569 Unspecified convulsions: Secondary | ICD-10-CM | POA: Diagnosis not present

## 2023-11-21 ENCOUNTER — Ambulatory Visit: Payer: Medicare Other | Admitting: Physical Medicine and Rehabilitation

## 2023-11-21 DIAGNOSIS — J45909 Unspecified asthma, uncomplicated: Secondary | ICD-10-CM | POA: Diagnosis not present

## 2023-11-21 DIAGNOSIS — Z79891 Long term (current) use of opiate analgesic: Secondary | ICD-10-CM | POA: Diagnosis not present

## 2023-11-21 DIAGNOSIS — Z87891 Personal history of nicotine dependence: Secondary | ICD-10-CM | POA: Diagnosis not present

## 2023-11-21 DIAGNOSIS — Z8616 Personal history of COVID-19: Secondary | ICD-10-CM | POA: Diagnosis not present

## 2023-11-21 DIAGNOSIS — G35 Multiple sclerosis: Secondary | ICD-10-CM | POA: Diagnosis not present

## 2023-11-21 DIAGNOSIS — I129 Hypertensive chronic kidney disease with stage 1 through stage 4 chronic kidney disease, or unspecified chronic kidney disease: Secondary | ICD-10-CM | POA: Diagnosis not present

## 2023-11-21 DIAGNOSIS — Z7951 Long term (current) use of inhaled steroids: Secondary | ICD-10-CM | POA: Diagnosis not present

## 2023-11-21 DIAGNOSIS — G629 Polyneuropathy, unspecified: Secondary | ICD-10-CM | POA: Diagnosis not present

## 2023-11-21 DIAGNOSIS — R569 Unspecified convulsions: Secondary | ICD-10-CM | POA: Diagnosis not present

## 2023-11-21 DIAGNOSIS — Z8673 Personal history of transient ischemic attack (TIA), and cerebral infarction without residual deficits: Secondary | ICD-10-CM | POA: Diagnosis not present

## 2023-11-21 DIAGNOSIS — E785 Hyperlipidemia, unspecified: Secondary | ICD-10-CM | POA: Diagnosis not present

## 2023-11-21 DIAGNOSIS — G894 Chronic pain syndrome: Secondary | ICD-10-CM | POA: Diagnosis not present

## 2023-11-21 DIAGNOSIS — N183 Chronic kidney disease, stage 3 unspecified: Secondary | ICD-10-CM | POA: Diagnosis not present

## 2023-11-23 ENCOUNTER — Telehealth: Payer: Self-pay | Admitting: Physical Medicine and Rehabilitation

## 2023-11-23 DIAGNOSIS — G35 Multiple sclerosis: Secondary | ICD-10-CM | POA: Diagnosis not present

## 2023-11-23 DIAGNOSIS — I129 Hypertensive chronic kidney disease with stage 1 through stage 4 chronic kidney disease, or unspecified chronic kidney disease: Secondary | ICD-10-CM | POA: Diagnosis not present

## 2023-11-23 DIAGNOSIS — R569 Unspecified convulsions: Secondary | ICD-10-CM | POA: Diagnosis not present

## 2023-11-23 DIAGNOSIS — J45909 Unspecified asthma, uncomplicated: Secondary | ICD-10-CM | POA: Diagnosis not present

## 2023-11-23 DIAGNOSIS — N183 Chronic kidney disease, stage 3 unspecified: Secondary | ICD-10-CM | POA: Diagnosis not present

## 2023-11-23 DIAGNOSIS — G894 Chronic pain syndrome: Secondary | ICD-10-CM | POA: Diagnosis not present

## 2023-11-23 MED ORDER — HYDROCODONE-ACETAMINOPHEN 10-325 MG PO TABS: 1.0000 | ORAL_TABLET | Freq: Four times a day (QID) | ORAL | 0 refills | Status: DC | PRN

## 2023-11-23 MED ORDER — TRAMADOL HCL 50 MG PO TABS: 50.0000 mg | ORAL_TABLET | Freq: Four times a day (QID) | ORAL | 3 refills | Status: DC | PRN

## 2023-11-23 NOTE — Telephone Encounter (Signed)
 Patient is needing a refill on Hydrocodone and Tramadol sent to [facility] on [address].

## 2023-11-23 NOTE — Telephone Encounter (Signed)
 Rxes written and sent

## 2023-11-24 DIAGNOSIS — J45909 Unspecified asthma, uncomplicated: Secondary | ICD-10-CM | POA: Diagnosis not present

## 2023-11-24 DIAGNOSIS — N183 Chronic kidney disease, stage 3 unspecified: Secondary | ICD-10-CM | POA: Diagnosis not present

## 2023-11-24 DIAGNOSIS — G35 Multiple sclerosis: Secondary | ICD-10-CM | POA: Diagnosis not present

## 2023-11-24 DIAGNOSIS — I129 Hypertensive chronic kidney disease with stage 1 through stage 4 chronic kidney disease, or unspecified chronic kidney disease: Secondary | ICD-10-CM | POA: Diagnosis not present

## 2023-11-24 DIAGNOSIS — G894 Chronic pain syndrome: Secondary | ICD-10-CM | POA: Diagnosis not present

## 2023-11-24 DIAGNOSIS — R569 Unspecified convulsions: Secondary | ICD-10-CM | POA: Diagnosis not present

## 2023-11-29 DIAGNOSIS — G35 Multiple sclerosis: Secondary | ICD-10-CM | POA: Diagnosis not present

## 2023-11-29 DIAGNOSIS — J45909 Unspecified asthma, uncomplicated: Secondary | ICD-10-CM | POA: Diagnosis not present

## 2023-11-29 DIAGNOSIS — R569 Unspecified convulsions: Secondary | ICD-10-CM | POA: Diagnosis not present

## 2023-11-29 DIAGNOSIS — I129 Hypertensive chronic kidney disease with stage 1 through stage 4 chronic kidney disease, or unspecified chronic kidney disease: Secondary | ICD-10-CM | POA: Diagnosis not present

## 2023-11-29 DIAGNOSIS — G894 Chronic pain syndrome: Secondary | ICD-10-CM | POA: Diagnosis not present

## 2023-11-29 DIAGNOSIS — N183 Chronic kidney disease, stage 3 unspecified: Secondary | ICD-10-CM | POA: Diagnosis not present

## 2023-11-30 DIAGNOSIS — G894 Chronic pain syndrome: Secondary | ICD-10-CM | POA: Diagnosis not present

## 2023-11-30 DIAGNOSIS — R569 Unspecified convulsions: Secondary | ICD-10-CM | POA: Diagnosis not present

## 2023-11-30 DIAGNOSIS — J45909 Unspecified asthma, uncomplicated: Secondary | ICD-10-CM | POA: Diagnosis not present

## 2023-11-30 DIAGNOSIS — N183 Chronic kidney disease, stage 3 unspecified: Secondary | ICD-10-CM | POA: Diagnosis not present

## 2023-11-30 DIAGNOSIS — G35 Multiple sclerosis: Secondary | ICD-10-CM | POA: Diagnosis not present

## 2023-11-30 DIAGNOSIS — I129 Hypertensive chronic kidney disease with stage 1 through stage 4 chronic kidney disease, or unspecified chronic kidney disease: Secondary | ICD-10-CM | POA: Diagnosis not present

## 2023-12-05 ENCOUNTER — Encounter: Payer: Self-pay | Admitting: Physical Medicine and Rehabilitation

## 2023-12-05 ENCOUNTER — Encounter
Payer: Medicare Other | Attending: Physical Medicine and Rehabilitation | Admitting: Physical Medicine and Rehabilitation

## 2023-12-05 VITALS — BP 108/71 | HR 86 | Ht 65.0 in | Wt 147.0 lb

## 2023-12-05 DIAGNOSIS — R52 Pain, unspecified: Secondary | ICD-10-CM | POA: Diagnosis not present

## 2023-12-05 DIAGNOSIS — G894 Chronic pain syndrome: Secondary | ICD-10-CM | POA: Insufficient documentation

## 2023-12-05 DIAGNOSIS — G35 Multiple sclerosis: Secondary | ICD-10-CM | POA: Diagnosis not present

## 2023-12-05 DIAGNOSIS — Z79891 Long term (current) use of opiate analgesic: Secondary | ICD-10-CM | POA: Insufficient documentation

## 2023-12-05 DIAGNOSIS — G8929 Other chronic pain: Secondary | ICD-10-CM | POA: Insufficient documentation

## 2023-12-05 DIAGNOSIS — M792 Neuralgia and neuritis, unspecified: Secondary | ICD-10-CM | POA: Diagnosis not present

## 2023-12-05 DIAGNOSIS — R269 Unspecified abnormalities of gait and mobility: Secondary | ICD-10-CM | POA: Diagnosis not present

## 2023-12-05 DIAGNOSIS — M5410 Radiculopathy, site unspecified: Secondary | ICD-10-CM | POA: Insufficient documentation

## 2023-12-05 DIAGNOSIS — Z5181 Encounter for therapeutic drug level monitoring: Secondary | ICD-10-CM | POA: Diagnosis not present

## 2023-12-05 NOTE — Patient Instructions (Addendum)
   Pt is a 73 yr old R handed female with hx of relapsing remitting MS and breast CA and seizures here for evaluation of chronic pain in B/L arms and chest due to mastectomy- dx'd 2014- and in remission.  A lot of nerve pain, likely from MS.  Here for f/u on chronic pain/MS.    On Lamictal  for seizures- so can already help for nerve pain.   2.  Also takes Vit B and Vit D supplements.  Which sometimes can help nerve pain.    3. Only taking Tramadol -  1x/day and Norco 10/325 1- 1.5 /day-  I don't want you to feel bad for taking it 2x/day- for each tramadol  and Norco. Just got refills, so doesn't need right now   4. Last UDS was 07/26/24-  will see if can do oral drug screen- - today since hard to pee on command per pt.    5. con't Home Health- - already making a difference with safety!- con't H/H  6. F/U with Emilia Harbour in 2months and me in 4 months- for chronic pain secondary to MS  7. Still not interested in Methadone - insurance won't cover Nucynta-  The best way to avoid hyperalgesia-  is to use meds as needed- and to only take a higher amount when needed.

## 2023-12-05 NOTE — Progress Notes (Signed)
 Subjective:    Patient ID: Selena Bender, female    DOB: August 21, 1950, 73 y.o.   MRN: [id number]  HPI  Pt is a 73 yr old R handed female with hx of relapsing remitting MS and breast CA and seizures here for evaluation of chronic pain in B/L arms and chest due to mastectomy- dx'd 2014- and in remission.  A lot of nerve pain, likely from MS.  Here for f/u on chronic pain/MS.     Pt is on Tramadol  as well as Norco 10/325 mg Usually takes tramadol  1x/day- in AM- and takes Norco 1x/day-  in AM and if still has pain at night, takes 1/2 tab-  Scared of taking meds.  Last got refills 4/16 of both.    Just the usual aches and pains per pt, however pain is increasing. .   Getting pain when laying down-  burning pain feels like getting worse.  But at night, when laying down in bed.   Getting OT and PT coming to the home.  Won't do HHA- isn't eligible- also gets a nurse as well as therapy-  has started-  so far, going really well.   1x/week right now-  was hopeful to go to 2x/week.   Vit D level 1 year ago 93.88-    Pain Inventory Average Pain 7 Pain Right Now 7 My pain is burning, tingling, and aching  In the last 24 hours, has pain interfered with the following? General activity 6 Relation with others 7 Enjoyment of life 6 What TIME of day is your pain at its worst? daytime and night Sleep (in general) Fair  Pain is worse with: walking, bending, sitting, inactivity, and standing Pain improves with: medication Relief from Meds: 5  Family History  Problem Relation Age of Onset   Sickle cell anemia Father    Breast cancer Neg Hx    Social History   Socioeconomic History   Marital status: Widowed    Spouse name: Not on file   Number of children: 2   Years of education: Not on file   Highest education level: Not on file  Occupational History   Not on file  Tobacco Use   Smoking status: Former    Types: Cigarettes   Smokeless tobacco: Never  Vaping Use   Vaping  status: Never Used  Substance and Sexual Activity   Alcohol use: Yes    Comment: occ   Drug use: Not Currently    Types: Marijuana    Comment: Gummies   Sexual activity: Not Currently  Other Topics Concern   Not on file  Social History Narrative   Not on file   Social Drivers of Health   Financial Resource Strain: Not on file  Food Insecurity: Not on file  Transportation Needs: Not on file  Physical Activity: Not on file  Stress: Not on file  Social Connections: Not on file   Past Surgical History:  Procedure Laterality Date   BREAST SURGERY     MASTECTOMY Right 2014   Past Surgical History:  Procedure Laterality Date   BREAST SURGERY     MASTECTOMY Right 2014   Past Medical History:  Diagnosis Date   Asthma    Breast cancer (HCC) 2014   Right   Chicken pox    CKD (chronic kidney disease), stage III (HCC)    COVID-19 virus infection 12/2020   Hypertension    Multiple sclerosis (HCC)    Seizures (HCC)    BP  108/71   Pulse 86   Ht 5\' 5"  (1.651 m)   Wt 147 lb (66.7 kg)   SpO2 96%   BMI 24.46 kg/m   Opioid Risk Score:   Fall Risk Score:  `1  Depression screen [facility] 2/9     09/27/2023   11:53 AM 07/27/2023   10:59 AM 04/25/2023   11:20 AM 01/26/2023    9:47 AM 01/14/2023    1:18 PM 09/03/2022   10:54 AM 06/02/2022   11:17 AM  Depression screen PHQ 2/9  Decreased Interest 1 2 2 2 2  0 0  Down, Depressed, Hopeless 1 2 2 1 2  0 0  PHQ - 2 Score 2 4 4 3 4  0 0  Altered sleeping     2    Tired, decreased energy     3    Change in appetite     3    Feeling bad or failure about yourself      0    Trouble concentrating     1    Moving slowly or fidgety/restless     0    Suicidal thoughts     0    PHQ-9 Score     13    Difficult doing work/chores     Very difficult       Review of Systems  Musculoskeletal:        Bilateral leg pain to feet  All other systems reviewed and are negative.     Objective:   Physical Exam  Awake, alert, appropriate,  dysconjugate gaze, NAD accompanied by adult child Using small quad cane to get around      Assessment & Plan:     Pt is a 73 yr old R handed female with hx of relapsing remitting MS and breast CA and seizures here for evaluation of chronic pain in B/L arms and chest due to mastectomy- dx'd 2014- and in remission.  A lot of nerve pain, likely from MS.  Here for f/u on chronic pain/MS.    On Lamictal  for seizures- so can already help for nerve pain.   2.  Also takes Vit B and Vit D supplements.  Which sometimes can help nerve pain.    3. Only taking Tramadol -  1x/day and Norco 10/325 1- 1.5 /day-  I don't want you to feel bad for taking it 2x/day- for each tramadol  and Norco. Just got refills, so doesn't need right now   4. Last UDS was 07/26/24-  will see if can do oral drug screen- - today since hard to pee on command per pt.    5. con't Home Health- - already making a difference with safety!- con't H/H  6. F/U with Emilia Harbour in 2months and me in 4 months- for chronic pain secondary to MS  7. Still not interested in Methadone - insurance won't cover Nucynta- The best way to avoid hyperalgesia-  is to use meds as needed- and to only take a higher amount when needed.    I spent a total of 24   minutes on total care today- >50% coordination of care- due to d/w pt about Options for nerve pain- already on Lamictal  and tried all other nerve pain meds- on Norco- discussed hyperalgesia-and how to avoid as much as possible-

## 2023-12-05 NOTE — Addendum Note (Signed)
 Addended by: Avelina Bode T on: 12/05/2023 02:30 PM   Modules accepted: Orders

## 2023-12-06 DIAGNOSIS — G894 Chronic pain syndrome: Secondary | ICD-10-CM | POA: Diagnosis not present

## 2023-12-06 DIAGNOSIS — R569 Unspecified convulsions: Secondary | ICD-10-CM | POA: Diagnosis not present

## 2023-12-06 DIAGNOSIS — G35 Multiple sclerosis: Secondary | ICD-10-CM | POA: Diagnosis not present

## 2023-12-06 DIAGNOSIS — I129 Hypertensive chronic kidney disease with stage 1 through stage 4 chronic kidney disease, or unspecified chronic kidney disease: Secondary | ICD-10-CM | POA: Diagnosis not present

## 2023-12-06 DIAGNOSIS — J45909 Unspecified asthma, uncomplicated: Secondary | ICD-10-CM | POA: Diagnosis not present

## 2023-12-06 DIAGNOSIS — N183 Chronic kidney disease, stage 3 unspecified: Secondary | ICD-10-CM | POA: Diagnosis not present

## 2023-12-07 DIAGNOSIS — E7800 Pure hypercholesterolemia, unspecified: Secondary | ICD-10-CM | POA: Diagnosis not present

## 2023-12-07 DIAGNOSIS — N1832 Chronic kidney disease, stage 3b: Secondary | ICD-10-CM | POA: Diagnosis not present

## 2023-12-07 DIAGNOSIS — G35 Multiple sclerosis: Secondary | ICD-10-CM | POA: Diagnosis not present

## 2023-12-07 DIAGNOSIS — I10 Essential (primary) hypertension: Secondary | ICD-10-CM | POA: Diagnosis not present

## 2023-12-07 DIAGNOSIS — N183 Chronic kidney disease, stage 3 unspecified: Secondary | ICD-10-CM | POA: Diagnosis not present

## 2023-12-07 DIAGNOSIS — I129 Hypertensive chronic kidney disease with stage 1 through stage 4 chronic kidney disease, or unspecified chronic kidney disease: Secondary | ICD-10-CM | POA: Diagnosis not present

## 2023-12-07 DIAGNOSIS — R569 Unspecified convulsions: Secondary | ICD-10-CM | POA: Diagnosis not present

## 2023-12-07 DIAGNOSIS — G894 Chronic pain syndrome: Secondary | ICD-10-CM | POA: Diagnosis not present

## 2023-12-07 DIAGNOSIS — Z853 Personal history of malignant neoplasm of breast: Secondary | ICD-10-CM | POA: Diagnosis not present

## 2023-12-07 DIAGNOSIS — J45909 Unspecified asthma, uncomplicated: Secondary | ICD-10-CM | POA: Diagnosis not present

## 2023-12-11 LAB — DRUG TOX MONITOR 1 W/CONF, ORAL FLD
Amphetamines: NEGATIVE ng/mL (ref <10)
Barbiturates: NEGATIVE ng/mL (ref <10)
Benzodiazepines: NEGATIVE ng/mL (ref <0.50)
Buprenorphine: NEGATIVE ng/mL (ref <0.10)
Cocaine: NEGATIVE ng/mL (ref <5.0)
Codeine: NEGATIVE ng/mL (ref <2.5)
Dihydrocodeine: 23.3 ng/mL — ABNORMAL HIGH (ref <2.5)
Fentanyl: NEGATIVE ng/mL (ref <0.10)
Heroin Metabolite: NEGATIVE ng/mL (ref <1.0)
Hydrocodone: >250 ng/mL — ABNORMAL HIGH (ref <2.5)
Hydromorphone: NEGATIVE ng/mL (ref <2.5)
MARIJUANA: NEGATIVE ng/mL (ref <2.5)
MDMA: NEGATIVE ng/mL (ref <10)
Meprobamate: NEGATIVE ng/mL (ref <2.5)
Methadone: NEGATIVE ng/mL (ref <5.0)
Morphine: NEGATIVE ng/mL (ref <2.5)
Nicotine Metabolite: NEGATIVE ng/mL (ref <5.0)
Norhydrocodone: 38.4 ng/mL — ABNORMAL HIGH (ref <2.5)
Noroxycodone: NEGATIVE ng/mL (ref <2.5)
Opiates: POSITIVE ng/mL — AB (ref <2.5)
Oxycodone: NEGATIVE ng/mL (ref <2.5)
Oxymorphone: NEGATIVE ng/mL (ref <2.5)
Phencyclidine: NEGATIVE ng/mL (ref <10)
Tapentadol: NEGATIVE ng/mL (ref <5.0)
Tramadol: >500 ng/mL — ABNORMAL HIGH (ref <5.0)
Tramadol: POSITIVE ng/mL — AB (ref <5.0)
Zolpidem: NEGATIVE ng/mL (ref <5.0)

## 2023-12-11 LAB — DRUG TOX ALC METAB W/CON, ORAL FLD: Alcohol Metabolite: NEGATIVE ng/mL (ref <25)

## 2023-12-13 DIAGNOSIS — R569 Unspecified convulsions: Secondary | ICD-10-CM | POA: Diagnosis not present

## 2023-12-13 DIAGNOSIS — J45909 Unspecified asthma, uncomplicated: Secondary | ICD-10-CM | POA: Diagnosis not present

## 2023-12-13 DIAGNOSIS — G35 Multiple sclerosis: Secondary | ICD-10-CM | POA: Diagnosis not present

## 2023-12-13 DIAGNOSIS — N183 Chronic kidney disease, stage 3 unspecified: Secondary | ICD-10-CM | POA: Diagnosis not present

## 2023-12-13 DIAGNOSIS — I129 Hypertensive chronic kidney disease with stage 1 through stage 4 chronic kidney disease, or unspecified chronic kidney disease: Secondary | ICD-10-CM | POA: Diagnosis not present

## 2023-12-13 DIAGNOSIS — G894 Chronic pain syndrome: Secondary | ICD-10-CM | POA: Diagnosis not present

## 2023-12-15 DIAGNOSIS — J45909 Unspecified asthma, uncomplicated: Secondary | ICD-10-CM | POA: Diagnosis not present

## 2023-12-15 DIAGNOSIS — I129 Hypertensive chronic kidney disease with stage 1 through stage 4 chronic kidney disease, or unspecified chronic kidney disease: Secondary | ICD-10-CM | POA: Diagnosis not present

## 2023-12-15 DIAGNOSIS — N183 Chronic kidney disease, stage 3 unspecified: Secondary | ICD-10-CM | POA: Diagnosis not present

## 2023-12-15 DIAGNOSIS — G35 Multiple sclerosis: Secondary | ICD-10-CM | POA: Diagnosis not present

## 2023-12-15 DIAGNOSIS — R569 Unspecified convulsions: Secondary | ICD-10-CM | POA: Diagnosis not present

## 2023-12-15 DIAGNOSIS — G894 Chronic pain syndrome: Secondary | ICD-10-CM | POA: Diagnosis not present

## 2023-12-16 DIAGNOSIS — R569 Unspecified convulsions: Secondary | ICD-10-CM | POA: Diagnosis not present

## 2023-12-16 DIAGNOSIS — I129 Hypertensive chronic kidney disease with stage 1 through stage 4 chronic kidney disease, or unspecified chronic kidney disease: Secondary | ICD-10-CM | POA: Diagnosis not present

## 2023-12-16 DIAGNOSIS — J45909 Unspecified asthma, uncomplicated: Secondary | ICD-10-CM | POA: Diagnosis not present

## 2023-12-16 DIAGNOSIS — G35 Multiple sclerosis: Secondary | ICD-10-CM | POA: Diagnosis not present

## 2023-12-16 DIAGNOSIS — N183 Chronic kidney disease, stage 3 unspecified: Secondary | ICD-10-CM | POA: Diagnosis not present

## 2023-12-16 DIAGNOSIS — G894 Chronic pain syndrome: Secondary | ICD-10-CM | POA: Diagnosis not present

## 2023-12-19 DIAGNOSIS — N183 Chronic kidney disease, stage 3 unspecified: Secondary | ICD-10-CM | POA: Diagnosis not present

## 2023-12-19 DIAGNOSIS — G894 Chronic pain syndrome: Secondary | ICD-10-CM | POA: Diagnosis not present

## 2023-12-19 DIAGNOSIS — G35 Multiple sclerosis: Secondary | ICD-10-CM | POA: Diagnosis not present

## 2023-12-19 DIAGNOSIS — J45909 Unspecified asthma, uncomplicated: Secondary | ICD-10-CM | POA: Diagnosis not present

## 2023-12-19 DIAGNOSIS — R569 Unspecified convulsions: Secondary | ICD-10-CM | POA: Diagnosis not present

## 2023-12-19 DIAGNOSIS — I129 Hypertensive chronic kidney disease with stage 1 through stage 4 chronic kidney disease, or unspecified chronic kidney disease: Secondary | ICD-10-CM | POA: Diagnosis not present

## 2023-12-20 ENCOUNTER — Telehealth: Payer: Self-pay

## 2023-12-20 DIAGNOSIS — R569 Unspecified convulsions: Secondary | ICD-10-CM | POA: Diagnosis not present

## 2023-12-20 DIAGNOSIS — I129 Hypertensive chronic kidney disease with stage 1 through stage 4 chronic kidney disease, or unspecified chronic kidney disease: Secondary | ICD-10-CM | POA: Diagnosis not present

## 2023-12-20 DIAGNOSIS — G35 Multiple sclerosis: Secondary | ICD-10-CM | POA: Diagnosis not present

## 2023-12-20 DIAGNOSIS — J45909 Unspecified asthma, uncomplicated: Secondary | ICD-10-CM | POA: Diagnosis not present

## 2023-12-20 DIAGNOSIS — G894 Chronic pain syndrome: Secondary | ICD-10-CM | POA: Diagnosis not present

## 2023-12-20 DIAGNOSIS — N183 Chronic kidney disease, stage 3 unspecified: Secondary | ICD-10-CM | POA: Diagnosis not present

## 2023-12-20 NOTE — Telephone Encounter (Signed)
 Pt. Physical therapist wanted to extend her therapy to 1 time a wk for 8 wks. Approved.

## 2023-12-21 DIAGNOSIS — G35 Multiple sclerosis: Secondary | ICD-10-CM | POA: Diagnosis not present

## 2023-12-21 DIAGNOSIS — Z8616 Personal history of COVID-19: Secondary | ICD-10-CM | POA: Diagnosis not present

## 2023-12-21 DIAGNOSIS — G629 Polyneuropathy, unspecified: Secondary | ICD-10-CM | POA: Diagnosis not present

## 2023-12-21 DIAGNOSIS — E785 Hyperlipidemia, unspecified: Secondary | ICD-10-CM | POA: Diagnosis not present

## 2023-12-21 DIAGNOSIS — J45909 Unspecified asthma, uncomplicated: Secondary | ICD-10-CM | POA: Diagnosis not present

## 2023-12-21 DIAGNOSIS — Z8673 Personal history of transient ischemic attack (TIA), and cerebral infarction without residual deficits: Secondary | ICD-10-CM | POA: Diagnosis not present

## 2023-12-21 DIAGNOSIS — R569 Unspecified convulsions: Secondary | ICD-10-CM | POA: Diagnosis not present

## 2023-12-21 DIAGNOSIS — G894 Chronic pain syndrome: Secondary | ICD-10-CM | POA: Diagnosis not present

## 2023-12-21 DIAGNOSIS — Z87891 Personal history of nicotine dependence: Secondary | ICD-10-CM | POA: Diagnosis not present

## 2023-12-21 DIAGNOSIS — N183 Chronic kidney disease, stage 3 unspecified: Secondary | ICD-10-CM | POA: Diagnosis not present

## 2023-12-21 DIAGNOSIS — Z853 Personal history of malignant neoplasm of breast: Secondary | ICD-10-CM | POA: Diagnosis not present

## 2023-12-21 DIAGNOSIS — I129 Hypertensive chronic kidney disease with stage 1 through stage 4 chronic kidney disease, or unspecified chronic kidney disease: Secondary | ICD-10-CM | POA: Diagnosis not present

## 2023-12-30 DIAGNOSIS — N183 Chronic kidney disease, stage 3 unspecified: Secondary | ICD-10-CM | POA: Diagnosis not present

## 2023-12-30 DIAGNOSIS — J45909 Unspecified asthma, uncomplicated: Secondary | ICD-10-CM | POA: Diagnosis not present

## 2023-12-30 DIAGNOSIS — I129 Hypertensive chronic kidney disease with stage 1 through stage 4 chronic kidney disease, or unspecified chronic kidney disease: Secondary | ICD-10-CM | POA: Diagnosis not present

## 2023-12-30 DIAGNOSIS — G35 Multiple sclerosis: Secondary | ICD-10-CM | POA: Diagnosis not present

## 2023-12-30 DIAGNOSIS — G894 Chronic pain syndrome: Secondary | ICD-10-CM | POA: Diagnosis not present

## 2023-12-30 DIAGNOSIS — R569 Unspecified convulsions: Secondary | ICD-10-CM | POA: Diagnosis not present

## 2024-01-03 DIAGNOSIS — N183 Chronic kidney disease, stage 3 unspecified: Secondary | ICD-10-CM | POA: Diagnosis not present

## 2024-01-03 DIAGNOSIS — G35 Multiple sclerosis: Secondary | ICD-10-CM | POA: Diagnosis not present

## 2024-01-03 DIAGNOSIS — I129 Hypertensive chronic kidney disease with stage 1 through stage 4 chronic kidney disease, or unspecified chronic kidney disease: Secondary | ICD-10-CM | POA: Diagnosis not present

## 2024-01-03 DIAGNOSIS — J45909 Unspecified asthma, uncomplicated: Secondary | ICD-10-CM | POA: Diagnosis not present

## 2024-01-03 DIAGNOSIS — G894 Chronic pain syndrome: Secondary | ICD-10-CM | POA: Diagnosis not present

## 2024-01-03 DIAGNOSIS — R569 Unspecified convulsions: Secondary | ICD-10-CM | POA: Diagnosis not present

## 2024-01-07 DIAGNOSIS — E7800 Pure hypercholesterolemia, unspecified: Secondary | ICD-10-CM | POA: Diagnosis not present

## 2024-01-07 DIAGNOSIS — Z853 Personal history of malignant neoplasm of breast: Secondary | ICD-10-CM | POA: Diagnosis not present

## 2024-01-07 DIAGNOSIS — N1832 Chronic kidney disease, stage 3b: Secondary | ICD-10-CM | POA: Diagnosis not present

## 2024-01-07 DIAGNOSIS — I10 Essential (primary) hypertension: Secondary | ICD-10-CM | POA: Diagnosis not present

## 2024-01-10 DIAGNOSIS — N183 Chronic kidney disease, stage 3 unspecified: Secondary | ICD-10-CM | POA: Diagnosis not present

## 2024-01-10 DIAGNOSIS — J45909 Unspecified asthma, uncomplicated: Secondary | ICD-10-CM | POA: Diagnosis not present

## 2024-01-10 DIAGNOSIS — I129 Hypertensive chronic kidney disease with stage 1 through stage 4 chronic kidney disease, or unspecified chronic kidney disease: Secondary | ICD-10-CM | POA: Diagnosis not present

## 2024-01-10 DIAGNOSIS — G35 Multiple sclerosis: Secondary | ICD-10-CM | POA: Diagnosis not present

## 2024-01-10 DIAGNOSIS — R569 Unspecified convulsions: Secondary | ICD-10-CM | POA: Diagnosis not present

## 2024-01-10 DIAGNOSIS — G894 Chronic pain syndrome: Secondary | ICD-10-CM | POA: Diagnosis not present

## 2024-01-18 DIAGNOSIS — R569 Unspecified convulsions: Secondary | ICD-10-CM | POA: Diagnosis not present

## 2024-01-18 DIAGNOSIS — N183 Chronic kidney disease, stage 3 unspecified: Secondary | ICD-10-CM | POA: Diagnosis not present

## 2024-01-18 DIAGNOSIS — G35 Multiple sclerosis: Secondary | ICD-10-CM | POA: Diagnosis not present

## 2024-01-18 DIAGNOSIS — J45909 Unspecified asthma, uncomplicated: Secondary | ICD-10-CM | POA: Diagnosis not present

## 2024-01-18 DIAGNOSIS — G894 Chronic pain syndrome: Secondary | ICD-10-CM | POA: Diagnosis not present

## 2024-01-18 DIAGNOSIS — I129 Hypertensive chronic kidney disease with stage 1 through stage 4 chronic kidney disease, or unspecified chronic kidney disease: Secondary | ICD-10-CM | POA: Diagnosis not present

## 2024-01-20 DIAGNOSIS — N183 Chronic kidney disease, stage 3 unspecified: Secondary | ICD-10-CM | POA: Diagnosis not present

## 2024-01-20 DIAGNOSIS — G35 Multiple sclerosis: Secondary | ICD-10-CM | POA: Diagnosis not present

## 2024-01-20 DIAGNOSIS — Z87891 Personal history of nicotine dependence: Secondary | ICD-10-CM | POA: Diagnosis not present

## 2024-01-20 DIAGNOSIS — G894 Chronic pain syndrome: Secondary | ICD-10-CM | POA: Diagnosis not present

## 2024-01-20 DIAGNOSIS — Z8616 Personal history of COVID-19: Secondary | ICD-10-CM | POA: Diagnosis not present

## 2024-01-20 DIAGNOSIS — G629 Polyneuropathy, unspecified: Secondary | ICD-10-CM | POA: Diagnosis not present

## 2024-01-20 DIAGNOSIS — J45909 Unspecified asthma, uncomplicated: Secondary | ICD-10-CM | POA: Diagnosis not present

## 2024-01-20 DIAGNOSIS — Z853 Personal history of malignant neoplasm of breast: Secondary | ICD-10-CM | POA: Diagnosis not present

## 2024-01-20 DIAGNOSIS — R569 Unspecified convulsions: Secondary | ICD-10-CM | POA: Diagnosis not present

## 2024-01-20 DIAGNOSIS — I129 Hypertensive chronic kidney disease with stage 1 through stage 4 chronic kidney disease, or unspecified chronic kidney disease: Secondary | ICD-10-CM | POA: Diagnosis not present

## 2024-01-20 DIAGNOSIS — Z8673 Personal history of transient ischemic attack (TIA), and cerebral infarction without residual deficits: Secondary | ICD-10-CM | POA: Diagnosis not present

## 2024-01-20 DIAGNOSIS — E785 Hyperlipidemia, unspecified: Secondary | ICD-10-CM | POA: Diagnosis not present

## 2024-01-23 DIAGNOSIS — I129 Hypertensive chronic kidney disease with stage 1 through stage 4 chronic kidney disease, or unspecified chronic kidney disease: Secondary | ICD-10-CM | POA: Diagnosis not present

## 2024-01-23 DIAGNOSIS — J45909 Unspecified asthma, uncomplicated: Secondary | ICD-10-CM | POA: Diagnosis not present

## 2024-01-23 DIAGNOSIS — N183 Chronic kidney disease, stage 3 unspecified: Secondary | ICD-10-CM | POA: Diagnosis not present

## 2024-01-23 DIAGNOSIS — G894 Chronic pain syndrome: Secondary | ICD-10-CM | POA: Diagnosis not present

## 2024-01-23 DIAGNOSIS — R569 Unspecified convulsions: Secondary | ICD-10-CM | POA: Diagnosis not present

## 2024-01-23 DIAGNOSIS — G35 Multiple sclerosis: Secondary | ICD-10-CM | POA: Diagnosis not present

## 2024-01-29 ENCOUNTER — Other Ambulatory Visit: Payer: Self-pay | Admitting: Neurology

## 2024-02-02 DIAGNOSIS — I129 Hypertensive chronic kidney disease with stage 1 through stage 4 chronic kidney disease, or unspecified chronic kidney disease: Secondary | ICD-10-CM | POA: Diagnosis not present

## 2024-02-02 DIAGNOSIS — G894 Chronic pain syndrome: Secondary | ICD-10-CM | POA: Diagnosis not present

## 2024-02-02 DIAGNOSIS — R569 Unspecified convulsions: Secondary | ICD-10-CM | POA: Diagnosis not present

## 2024-02-02 DIAGNOSIS — N183 Chronic kidney disease, stage 3 unspecified: Secondary | ICD-10-CM | POA: Diagnosis not present

## 2024-02-02 DIAGNOSIS — G35 Multiple sclerosis: Secondary | ICD-10-CM | POA: Diagnosis not present

## 2024-02-02 DIAGNOSIS — J45909 Unspecified asthma, uncomplicated: Secondary | ICD-10-CM | POA: Diagnosis not present

## 2024-02-03 ENCOUNTER — Encounter: Attending: Physical Medicine and Rehabilitation | Admitting: Registered Nurse

## 2024-02-03 VITALS — BP 121/78 | HR 90 | Ht 65.0 in | Wt 142.0 lb

## 2024-02-03 DIAGNOSIS — Z5181 Encounter for therapeutic drug level monitoring: Secondary | ICD-10-CM | POA: Diagnosis not present

## 2024-02-03 DIAGNOSIS — G8929 Other chronic pain: Secondary | ICD-10-CM | POA: Insufficient documentation

## 2024-02-03 DIAGNOSIS — M792 Neuralgia and neuritis, unspecified: Secondary | ICD-10-CM | POA: Insufficient documentation

## 2024-02-03 DIAGNOSIS — G894 Chronic pain syndrome: Secondary | ICD-10-CM | POA: Diagnosis not present

## 2024-02-03 DIAGNOSIS — M545 Low back pain, unspecified: Secondary | ICD-10-CM | POA: Diagnosis not present

## 2024-02-03 MED ORDER — HYDROCODONE-ACETAMINOPHEN 10-325 MG PO TABS: 1.0000 | ORAL_TABLET | Freq: Two times a day (BID) | ORAL | 0 refills | Status: DC | PRN

## 2024-02-03 NOTE — Patient Instructions (Signed)
 My- Chart:   [phone]

## 2024-02-03 NOTE — Progress Notes (Addendum)
 Subjective:    Patient ID: Selena Bender, female    DOB: [birth year], 73 y.o.   MRN: [id number]  HPI: Selena Bender is a 73 y.o. female who returns for follow up appointment for chronic pain and medication refill. She states her pain is located in her bilateral lower extremities with tingling and burning. She rates her pain 7. Her current exercise regime is receiving physical therapy weekly., walking with walker  and performing stretching exercises.  Ms. Selena Bender Morphine equivalent is 40.00 MME., she takes her hydrocodone  1-2 tablets, daily as needed or pain    Last Oral Swab was Performed on 12/05/2023, it was consistent.     Pain Inventory Average Pain 7 Pain Right Now 7 My pain is burning, tingling, and aching  In the last 24 hours, has pain interfered with the following? General activity 5 Relation with others 6 Enjoyment of life 5 What TIME of day is your pain at its worst? varies Sleep (in general) Good  Pain is worse with: walking, standing, and some activites Pain improves with: rest, therapy/exercise, and medication Relief from Meds: 5  Family History  Problem Relation Age of Onset   Sickle cell anemia Father    Breast cancer Neg Hx    Social History   Socioeconomic History   Marital status: Widowed    Spouse name: Not on file   Number of children: 2   Years of education: Not on file   Highest education level: Not on file  Occupational History   Not on file  Tobacco Use   Smoking status: Former    Types: Cigarettes   Smokeless tobacco: Never  Vaping Use   Vaping status: Never Used  Substance and Sexual Activity   Alcohol use: Yes    Comment: occ   Drug use: Not Currently    Types: Marijuana    Comment: Gummies   Sexual activity: Not Currently  Other Topics Concern   Not on file  Social History Narrative   Not on file   Social Drivers of Health   Financial Resource Strain: Not on file  Food Insecurity: Not on file   Transportation Needs: Not on file  Physical Activity: Not on file  Stress: Not on file  Social Connections: Not on file   Past Surgical History:  Procedure Laterality Date   BREAST SURGERY     MASTECTOMY Right 2014   Past Surgical History:  Procedure Laterality Date   BREAST SURGERY     MASTECTOMY Right 2014   Past Medical History:  Diagnosis Date   Asthma    Breast cancer (HCC) 2014   Right   Chicken pox    CKD (chronic kidney disease), stage III (HCC)    COVID-19 virus infection 12/2020   Hypertension    Multiple sclerosis (HCC)    Seizures (HCC)    BP 121/78   Pulse 90   Ht 5' 5 (1.651 m)   Wt 142 lb (64.4 kg)   SpO2 94%   BMI 23.63 kg/m   Opioid Risk Score:   Fall Risk Score:  `1  Depression screen [facility] 2/9     09/27/2023   11:53 AM 07/27/2023   10:59 AM 04/25/2023   11:20 AM 01/26/2023    9:47 AM 01/14/2023    1:18 PM 09/03/2022   10:54 AM 06/02/2022   11:17 AM  Depression screen PHQ 2/9  Decreased Interest 1 2 2 2 2  0 0  Down, Depressed, Hopeless 1 2  2 1 2  0 0  PHQ - 2 Score 2 4 4 3 4  0 0  Altered sleeping     2    Tired, decreased energy     3    Change in appetite     3    Feeling bad or failure about yourself      0    Trouble concentrating     1    Moving slowly or fidgety/restless     0    Suicidal thoughts     0    PHQ-9 Score     13    Difficult doing work/chores     Very difficult       Review of Systems     Objective:   Physical Exam Vitals and nursing note reviewed.  Constitutional:      Appearance: Normal appearance.  Cardiovascular:     Rate and Rhythm: Normal rate and regular rhythm.     Pulses: Normal pulses.     Heart sounds: Normal heart sounds.  Pulmonary:     Effort: Pulmonary effort is normal.     Breath sounds: Normal breath sounds.  Musculoskeletal:     Comments: Normal Muscle Bulk and Muscle Testing Reveals:  Upper Extremities: Full ROM and Muscle Strength 55 Lower Extremities: Right: Full ROM and Muscle  Strength 5/5 Left Lower Extremity: Decreased ROM and Muscle Strength 5/5 Arises from Table slowly using cane or support Antalgic  Gait     Skin:    General: Skin is warm and dry.  Neurological:     Mental Status: She is alert and oriented to person, place, and time.  Psychiatric:        Mood and Affect: Mood normal.        Behavior: Behavior normal.          Assessment & Plan:  Polyneuropathy: Continue current medication regimen. Continue to monitor. 02/03/2024 Chronic Pain Syndrome: Continue: Tramadol  50 mg one tablet every 12 hours as needed for pain #60. And  refilled : Hydrocodone  10/325 mg one tablet twice a day  as needed for pain # 60.  We will continue the opioid monitoring program, this consists of regular clinic visits, examinations, urine drug screen, pill counts as well as use of [place]  Controlled Substance Reporting system. A 12 month History has been reviewed on the [place]  Controlled Substance Reporting System on 02/03/2024  MS: Right Foot Drop: Per Physical Therapist recommendation, Ms. Selena Bender needs Right foot AFO Brace : This was discussed with Dr Cornelio, she agrees with Brace : 02/17/2024 F/U in 2 months

## 2024-02-06 ENCOUNTER — Other Ambulatory Visit: Payer: Self-pay | Admitting: Neurology

## 2024-02-07 DIAGNOSIS — N183 Chronic kidney disease, stage 3 unspecified: Secondary | ICD-10-CM | POA: Diagnosis not present

## 2024-02-07 DIAGNOSIS — G894 Chronic pain syndrome: Secondary | ICD-10-CM | POA: Diagnosis not present

## 2024-02-07 DIAGNOSIS — J45909 Unspecified asthma, uncomplicated: Secondary | ICD-10-CM | POA: Diagnosis not present

## 2024-02-07 DIAGNOSIS — G35 Multiple sclerosis: Secondary | ICD-10-CM | POA: Diagnosis not present

## 2024-02-07 DIAGNOSIS — I129 Hypertensive chronic kidney disease with stage 1 through stage 4 chronic kidney disease, or unspecified chronic kidney disease: Secondary | ICD-10-CM | POA: Diagnosis not present

## 2024-02-07 DIAGNOSIS — R569 Unspecified convulsions: Secondary | ICD-10-CM | POA: Diagnosis not present

## 2024-02-11 ENCOUNTER — Encounter: Payer: Self-pay | Admitting: Registered Nurse

## 2024-02-16 DIAGNOSIS — N183 Chronic kidney disease, stage 3 unspecified: Secondary | ICD-10-CM | POA: Diagnosis not present

## 2024-02-16 DIAGNOSIS — R569 Unspecified convulsions: Secondary | ICD-10-CM | POA: Diagnosis not present

## 2024-02-16 DIAGNOSIS — G35 Multiple sclerosis: Secondary | ICD-10-CM | POA: Diagnosis not present

## 2024-02-16 DIAGNOSIS — G894 Chronic pain syndrome: Secondary | ICD-10-CM | POA: Diagnosis not present

## 2024-02-16 DIAGNOSIS — I129 Hypertensive chronic kidney disease with stage 1 through stage 4 chronic kidney disease, or unspecified chronic kidney disease: Secondary | ICD-10-CM | POA: Diagnosis not present

## 2024-02-16 DIAGNOSIS — J45909 Unspecified asthma, uncomplicated: Secondary | ICD-10-CM | POA: Diagnosis not present

## 2024-03-23 ENCOUNTER — Other Ambulatory Visit: Payer: Self-pay

## 2024-03-23 MED ORDER — HYDROCODONE-ACETAMINOPHEN 10-325 MG PO TABS: 1.0000 | ORAL_TABLET | Freq: Two times a day (BID) | ORAL | 0 refills | Status: DC | PRN

## 2024-03-23 NOTE — Telephone Encounter (Signed)
 Refill request. Next appt 04/11/24

## 2024-03-26 NOTE — Progress Notes (Unsigned)
 NEUROLOGY FOLLOW UP OFFICE NOTE  Selena Bender [id number]  Assessment/Plan:   1.  Multiple sclerosis 2.  Focal onset seizures with impaired consciousness, stable 3.  Cervical spinal stenosis    1.  Seizure prophylaxis: Lamotrigine  50mg  twice daily 2.  Check CBC, CMP and vit D 3.  D3 4000 IU daily 4.  Follow up in one year or sooner if needed. ***  Subjective:  Selena Bender is a 73 year old right-handed black female with hypertension and history of breast cancer who follows up for multiple sclerosis and seizure.  MRIs from April personally reviewed.   UPDATE: Current disease modifying therapy:  None Current seizure prophylaxis: lamotrigine  50mg  twice daily Current medications:  vitamin D  4000 IU daily    Vision:  OK.  Dysconjugate gaze. Motor:  Left foot weakness.  Sometimes her left hand cramps and closes, toes on left foot cramp. She reports that when she stands, her left knee goes back, causing balance problems.  No pain Sensory:  She has neuropathy involving the right leg.   Pain:  Worsening burning pain in the legs from the hips down.  No back pain.  Gabapentin  ineffective.  Followed by pain management.  Taking tramadol .  Prescribed methadone  but hasn't picked it up.   Gait:  Unsteady.  Tendency to lean backwards.  Left foot drags.  Poor balance Bowel/Bladder:  No issues. Fatigue:  Yes but manageable. Cognition:  Once in awhile she may have word-finding difficulty. Mood:  Some depression from time to time.  Not chronic.   Seizures:  No seizures.   HISTORY: Multiple Sclerosis: She had an initial flare up when she was in her 30s.  She had an episode of numbness from the waist down and difficulty with fine-finger movement, lasting 3-4 months.  After several years, she then started dragging her left foot.  She was formally diagnosed with multiple sclerosis in 2005 after MRI and CSF analysis.  She was started on Copaxone but stopped about 2 years ago.  She has  not had any relapses but has had a gradual decline over the last several years, primary affecting her balance and gait.     Past disease modifying therapy:  Copaxone (stopped in 2017- 2018 because she no longer had a neurologist)  Past pain medications:  gabapentin , pregablin, duloxetine     Seizure Disorder: She also has a seizure disorder, possibly secondary to MS.  She has had a total of 3 seizures.  Last seizure was in the mid-90s.   Past anti-epileptic medications:  Dilantin   Imaging: 11/13/2022 MRI BRAIN & C-SPINE W WO:  1. Similar sequela of chronic demyelination intracranially and in the cervical spine. No new or enhancing lesions identified. 2. Similar multifocal degenerative change including severe bilateral foraminal stenosis C4-C5 and C5-C6 with posterior disc osteophyte complexes causing mass effect on the cord at these levels. 06/06/2020 MRI BRAIN W WO:  Stable 06/06/2020 MRI CERVICAL W WO:  multifocal MS cord lesions with similar multilevel degenerative changes causing similar mass effect on the left eccentric cord at C4-5 and C5-6 with severe bilateral foraminal stenosis.  05/11/2019 MRI BRAIN W WO:  Advanced chronic demyelinating disease involving the cerebral white matter and cortex as as well as suspected superimposed chronic small vessel ischemia particularly involving the right caudate and cerebellum. 05/11/2019 MRI CERVICAL W WO:  Chronic multifocal patchy demyelination involving the spinal cord and cervicomedullary junction, with severe involvement of left hemicord at C4 level.  Degenerative cervical spine disease  with moderate spinal stenosis causing mass effect on left hemicord at C4-5 with moderate to severe bilateral foraminal stenosis; lesser spinal stenosis at C6-7; moderate to severe bilateral foraminal stenosis at C6-7 and C7-T1.   Reports that she has seen neurosurgery for cervical spinal stenosis and that surgery was not recommended.  Subjective:  ***  PAST  MEDICAL HISTORY: Past Medical History:  Diagnosis Date   Asthma    Breast cancer (HCC) 2014   Right   Chicken pox    CKD (chronic kidney disease), stage III (HCC)    COVID-19 virus infection 12/2020   Hypertension    Multiple sclerosis (HCC)    Seizures (HCC)     MEDICATIONS: Current Outpatient Medications on File Prior to Visit  Medication Sig Dispense Refill   albuterol (VENTOLIN HFA) 108 (90 Base) MCG/ACT inhaler INHALE 1 TO 2 PUFFS EVERY 4 HOURS AS NEEDED     ascorbic acid (VITAMIN C) 500 MG/5ML syrup Take by mouth daily.     cetirizine  (ZYRTEC ) 10 MG tablet Take 1 tablet (10 mg total) by mouth daily. 30 tablet 11   cholecalciferol (VITAMIN D ) 25 MCG (1000 UNIT) tablet Take 4 tablets (4,000 Units total) by mouth daily. 90 tablet 3   fluticasone  (FLONASE ) 50 MCG/ACT nasal spray Place 1 spray into both nostrils at bedtime. 16 g 5   HYDROcodone -acetaminophen  (NORCO) 10-325 MG tablet Take 1 tablet by mouth 2 (two) times daily as needed. 60 tablet 0   lamoTRIgine  (LAMICTAL ) 25 MG tablet TAKE 2 TABLETS BY MOUTH TWICE A DAY 360 tablet 0   losartan  (COZAAR ) 50 MG tablet Take 50 mg by mouth daily.     terbinafine (LAMISIL) 250 MG tablet Take 250 mg by mouth daily.     traMADol  (ULTRAM ) 50 MG tablet Take 1 tablet (50 mg total) by mouth every 6 (six) hours as needed. 120 tablet 3   No current facility-administered medications on file prior to visit.    ALLERGIES: No Known Allergies  FAMILY HISTORY: Family History  Problem Relation Age of Onset   Sickle cell anemia Father    Breast cancer Neg Hx       Objective:  *** General: No acute distress.  Patient appears ***-groomed.   Head:  Normocephalic/atraumatic Eyes:  Fundi examined but not visualized Neck: supple, no paraspinal tenderness, full range of motion Heart:  Regular rate and rhythm Neurological Exam: alert and oriented.  Speech fluent and not dysarthric, language intact.  CN II-XII intact. Bulk and tone normal, muscle  strength 5/5 throughout.  Sensation to light touch intact.  Deep tendon reflexes 2+ throughout, toes downgoing.  Finger to nose testing intact.  Gait normal, Romberg negative.   Juliene Dunnings, DO  CC: ***

## 2024-03-27 ENCOUNTER — Other Ambulatory Visit

## 2024-03-27 ENCOUNTER — Encounter: Payer: Self-pay | Admitting: Neurology

## 2024-03-27 ENCOUNTER — Ambulatory Visit (INDEPENDENT_AMBULATORY_CARE_PROVIDER_SITE_OTHER): Payer: Medicare Other | Admitting: Neurology

## 2024-03-27 VITALS — BP 138/73 | HR 94 | Ht 66.0 in | Wt 146.0 lb

## 2024-03-27 DIAGNOSIS — G35 Multiple sclerosis: Secondary | ICD-10-CM | POA: Diagnosis not present

## 2024-03-27 DIAGNOSIS — E559 Vitamin D deficiency, unspecified: Secondary | ICD-10-CM

## 2024-03-27 DIAGNOSIS — Z79899 Other long term (current) drug therapy: Secondary | ICD-10-CM | POA: Diagnosis not present

## 2024-03-27 DIAGNOSIS — G40009 Localization-related (focal) (partial) idiopathic epilepsy and epileptic syndromes with seizures of localized onset, not intractable, without status epilepticus: Secondary | ICD-10-CM

## 2024-03-27 LAB — CBC
HCT: 37.3 % (ref 35.0–45.0)
Hemoglobin: 11.8 g/dL (ref 11.7–15.5)
MCH: 27.1 pg (ref 27.0–33.0)
MCHC: 31.6 g/dL — ABNORMAL LOW (ref 32.0–36.0)
MCV: 85.7 fL (ref 80.0–100.0)
MPV: 11.8 fL (ref 7.5–12.5)
Platelets: 206 Thousand/uL (ref 140–400)
RBC: 4.35 Million/uL (ref 3.80–5.10)
RDW: 13.5 % (ref 11.0–15.0)
WBC: 6.2 Thousand/uL (ref 3.8–10.8)

## 2024-03-27 LAB — VITAMIN D 25 HYDROXY (VIT D DEFICIENCY, FRACTURES): Vit D, 25-Hydroxy: 64 ng/mL (ref 30–100)

## 2024-03-27 NOTE — Patient Instructions (Addendum)
 Lamotrigine  50mg  twice daily D3 4000 I U daily Check vit D, CBC Follow up one year

## 2024-03-28 ENCOUNTER — Ambulatory Visit: Payer: Self-pay | Admitting: Neurology

## 2024-03-28 NOTE — Progress Notes (Signed)
 Patient advised.

## 2024-04-06 DIAGNOSIS — Z0000 Encounter for general adult medical examination without abnormal findings: Secondary | ICD-10-CM | POA: Diagnosis not present

## 2024-04-06 DIAGNOSIS — G35 Multiple sclerosis: Secondary | ICD-10-CM | POA: Diagnosis not present

## 2024-04-06 DIAGNOSIS — G8929 Other chronic pain: Secondary | ICD-10-CM | POA: Diagnosis not present

## 2024-04-06 DIAGNOSIS — N1832 Chronic kidney disease, stage 3b: Secondary | ICD-10-CM | POA: Diagnosis not present

## 2024-04-06 DIAGNOSIS — Z853 Personal history of malignant neoplasm of breast: Secondary | ICD-10-CM | POA: Diagnosis not present

## 2024-04-06 DIAGNOSIS — E7800 Pure hypercholesterolemia, unspecified: Secondary | ICD-10-CM | POA: Diagnosis not present

## 2024-04-06 DIAGNOSIS — I10 Essential (primary) hypertension: Secondary | ICD-10-CM | POA: Diagnosis not present

## 2024-04-06 DIAGNOSIS — E2839 Other primary ovarian failure: Secondary | ICD-10-CM | POA: Diagnosis not present

## 2024-04-06 DIAGNOSIS — R569 Unspecified convulsions: Secondary | ICD-10-CM | POA: Diagnosis not present

## 2024-04-10 ENCOUNTER — Other Ambulatory Visit (HOSPITAL_BASED_OUTPATIENT_CLINIC_OR_DEPARTMENT_OTHER): Payer: Self-pay | Admitting: Family Medicine

## 2024-04-10 DIAGNOSIS — E2839 Other primary ovarian failure: Secondary | ICD-10-CM

## 2024-04-11 ENCOUNTER — Encounter: Payer: Self-pay | Admitting: Physical Medicine and Rehabilitation

## 2024-04-11 ENCOUNTER — Encounter: Attending: Physical Medicine and Rehabilitation | Admitting: Physical Medicine and Rehabilitation

## 2024-04-11 VITALS — BP 138/83 | HR 81 | Ht 66.0 in | Wt 146.0 lb

## 2024-04-11 DIAGNOSIS — N1832 Chronic kidney disease, stage 3b: Secondary | ICD-10-CM | POA: Diagnosis not present

## 2024-04-11 DIAGNOSIS — Z79891 Long term (current) use of opiate analgesic: Secondary | ICD-10-CM | POA: Insufficient documentation

## 2024-04-11 DIAGNOSIS — Z5181 Encounter for therapeutic drug level monitoring: Secondary | ICD-10-CM | POA: Insufficient documentation

## 2024-04-11 DIAGNOSIS — G894 Chronic pain syndrome: Secondary | ICD-10-CM | POA: Diagnosis not present

## 2024-04-11 DIAGNOSIS — I10 Essential (primary) hypertension: Secondary | ICD-10-CM | POA: Diagnosis not present

## 2024-04-11 DIAGNOSIS — E7800 Pure hypercholesterolemia, unspecified: Secondary | ICD-10-CM | POA: Diagnosis not present

## 2024-04-11 MED ORDER — HYDROCODONE-ACETAMINOPHEN 10-325 MG PO TABS: 1.0000 | ORAL_TABLET | Freq: Two times a day (BID) | ORAL | 0 refills | Status: DC | PRN

## 2024-04-11 MED ORDER — TRAMADOL HCL 50 MG PO TABS: 50.0000 mg | ORAL_TABLET | Freq: Four times a day (QID) | ORAL | 3 refills | Status: AC | PRN

## 2024-04-11 NOTE — Patient Instructions (Signed)
 Pt is a 73 yr old R handed female with hx of relapsing remitting MS and breast CA and seizures here for evaluation of chronic pain in B/L arms and chest due to mastectomy- dx'd 2014- and in remission.  A lot of nerve pain, likely from MS.  Here for f/u on chronic pain/MS.    Con't Tramadol - usually takes 1x/day -is due for tramadol  will send it- will be when sees [facility]-   2. Con't Norco 10/325 mg-  x 2- for G89.0 and G35- for MS related chronic pain- to be filled 9.15 and 10/12  3.  Oral drug screen- per clinic policy- not using meds inappropriately.  4. F/U in 2 months with Fidela and me In 4 months- f/u on MS related pain.

## 2024-04-11 NOTE — Progress Notes (Signed)
 Subjective:    Patient ID: Selena Bender, female    DOB: [birth year], 73 y.o.   MRN: [id number]  HPI  Pt is a 73 yr old R handed female with hx of relapsing remitting MS and breast CA and seizures here for evaluation of chronic pain in B/L arms and chest due to mastectomy- dx'd 2014- and in remission.  A lot of nerve pain, likely from MS.  Here for f/u on chronic pain/MS.    Things going pretty good- No complaints.  Pain doing better-  Took Tylenol  and Norco before left the house- is pretty good.  Is now 3-4 right now.   Usually gets up to 7-8/10 if hasn't taken pain meds.   Taking Norco usually 1x/day and Tramadol - 1 of each daily.   Sometimes taking 1/2 tab at night- at 11pm, but not always-  1-2x/week.    Done with H/H- but is going ot start back.  The wrong agency called.  Dr Chrystal ordered-     No other major issues.  Still walking with quad cane- small based quad cane.   Got labs today by PCP- - did oral drug screen today as well-    Was getting cholesterol checked.    Pain Inventory Average Pain 7 Pain Right Now 4 My pain is burning  In the last 24 hours, has pain interfered with the following? General activity 4 Relation with others 3 Enjoyment of life 3 What TIME of day is your pain at its worst? daytime Sleep (in general) NA  Pain is worse with: inactivity Pain improves with: pacing activities Relief from Meds: 8  Family History  Problem Relation Age of Onset   Sickle cell anemia Father    Breast cancer Neg Hx    Social History   Socioeconomic History   Marital status: Widowed    Spouse name: Not on file   Number of children: 2   Years of education: Not on file   Highest education level: Not on file  Occupational History   Not on file  Tobacco Use   Smoking status: Former    Types: Cigarettes   Smokeless tobacco: Never  Vaping Use   Vaping status: Never Used  Substance and Sexual Activity   Alcohol use: Yes    Comment:  occ   Drug use: Not Currently    Types: Marijuana    Comment: Gummies   Sexual activity: Not Currently  Other Topics Concern   Not on file  Social History Narrative   Not on file   Social Drivers of Health   Financial Resource Strain: Not on file  Food Insecurity: Not on file  Transportation Needs: Not on file  Physical Activity: Not on file  Stress: Not on file  Social Connections: Not on file   Past Surgical History:  Procedure Laterality Date   BREAST SURGERY     MASTECTOMY Right 2014   Past Surgical History:  Procedure Laterality Date   BREAST SURGERY     MASTECTOMY Right 2014   Past Medical History:  Diagnosis Date   Asthma    Breast cancer (HCC) 2014   Right   Chicken pox    CKD (chronic kidney disease), stage III (HCC)    COVID-19 virus infection 12/2020   Hypertension    Multiple sclerosis (HCC)    Seizures (HCC)    BP 138/83   Pulse 81   Ht 5' 6 (1.676 m)   Wt 146 lb (66.2 kg) Comment:  reported  SpO2 94%   BMI 23.57 kg/m   Opioid Risk Score:   Fall Risk Score:  `1  Depression screen [facility] 2/9     04/11/2024   10:58 AM 09/27/2023   11:53 AM 07/27/2023   10:59 AM 04/25/2023   11:20 AM 01/26/2023    9:47 AM 01/14/2023    1:18 PM 09/03/2022   10:54 AM  Depression screen PHQ 2/9  Decreased Interest 1 1 2 2 2 2  0  Down, Depressed, Hopeless 1 1 2 2 1 2  0  PHQ - 2 Score 2 2 4 4 3 4  0  Altered sleeping      2   Tired, decreased energy      3   Change in appetite      3   Feeling bad or failure about yourself       0   Trouble concentrating      1   Moving slowly or fidgety/restless      0   Suicidal thoughts      0   PHQ-9 Score      13   Difficult doing work/chores      Very difficult     Review of Systems  All other systems reviewed and are negative.      Objective:   Physical Exam  Awake, alert, appropriate, walking with small quad cane, NAD Accompanied by family      Assessment & Plan:   Pt is a 73 yr old R handed female with hx  of relapsing remitting MS and breast CA and seizures here for evaluation of chronic pain in B/L arms and chest due to mastectomy- dx'd 2014- and in remission.  A lot of nerve pain, likely from MS.  Here for f/u on chronic pain/MS.    Con't Tramadol - usually takes 1x/day -is due for tramadol  will send it- will be when sees [facility]-   2. Con't Norco 10/325 mg-  x 2- for G89.0 and G35- for MS related chronic pain- to be filled 9.15 and 10/12  3.  Oral drug screen- per clinic policy- not using meds inappropriately.  4. F/U in 2 months with Fidela and me In 4 months- f/u on MS related pain.

## 2024-04-13 LAB — DRUG TOX MONITOR 1 W/CONF, ORAL FLD
Amphetamines: NEGATIVE ng/mL (ref <10)
Barbiturates: NEGATIVE ng/mL (ref <10)
Benzodiazepines: NEGATIVE ng/mL (ref <0.50)
Buprenorphine: NEGATIVE ng/mL (ref <0.10)
Cocaine: NEGATIVE ng/mL (ref <5.0)
Dihydrocodeine: 16.5 ng/mL — ABNORMAL HIGH (ref <2.5)
Fentanyl: NEGATIVE ng/mL (ref <0.10)
Heroin Metabolite: NEGATIVE ng/mL (ref <1.0)
Hydrocodone: >250 ng/mL — ABNORMAL HIGH (ref <2.5)
Hydromorphone: NEGATIVE ng/mL (ref <2.5)
MARIJUANA: NEGATIVE ng/mL (ref <2.5)
MDMA: NEGATIVE ng/mL (ref <10)
Meprobamate: NEGATIVE ng/mL (ref <2.5)
Methadone: NEGATIVE ng/mL (ref <5.0)
Morphine: NEGATIVE ng/mL (ref <2.5)
Nicotine Metabolite: NEGATIVE ng/mL (ref <5.0)
Norhydrocodone: 23.8 ng/mL — ABNORMAL HIGH (ref <2.5)
Noroxycodone: NEGATIVE ng/mL (ref <2.5)
Opiates: POSITIVE ng/mL — AB (ref <2.5)
Oxycodone: NEGATIVE ng/mL (ref <2.5)
Oxymorphone: NEGATIVE ng/mL (ref <2.5)
Phencyclidine: NEGATIVE ng/mL (ref <10)
Tapentadol: NEGATIVE ng/mL (ref <5.0)
Tramadol: >500 ng/mL — ABNORMAL HIGH (ref <5.0)
Tramadol: POSITIVE ng/mL — AB (ref <5.0)
Zolpidem: NEGATIVE ng/mL (ref <5.0)

## 2024-04-13 LAB — DRUG TOX ALC METAB W/CON, ORAL FLD: Alcohol Metabolite: NEGATIVE ng/mL (ref <25)

## 2024-04-17 DIAGNOSIS — Z87891 Personal history of nicotine dependence: Secondary | ICD-10-CM | POA: Diagnosis not present

## 2024-04-17 DIAGNOSIS — G629 Polyneuropathy, unspecified: Secondary | ICD-10-CM | POA: Diagnosis not present

## 2024-04-17 DIAGNOSIS — E7800 Pure hypercholesterolemia, unspecified: Secondary | ICD-10-CM | POA: Diagnosis not present

## 2024-04-17 DIAGNOSIS — M21372 Foot drop, left foot: Secondary | ICD-10-CM | POA: Diagnosis not present

## 2024-04-17 DIAGNOSIS — Z8616 Personal history of COVID-19: Secondary | ICD-10-CM | POA: Diagnosis not present

## 2024-04-17 DIAGNOSIS — E2839 Other primary ovarian failure: Secondary | ICD-10-CM | POA: Diagnosis not present

## 2024-04-17 DIAGNOSIS — Z5982 Transportation insecurity: Secondary | ICD-10-CM | POA: Diagnosis not present

## 2024-04-17 DIAGNOSIS — G35 Multiple sclerosis: Secondary | ICD-10-CM | POA: Diagnosis not present

## 2024-04-17 DIAGNOSIS — Z556 Problems related to health literacy: Secondary | ICD-10-CM | POA: Diagnosis not present

## 2024-04-17 DIAGNOSIS — N1832 Chronic kidney disease, stage 3b: Secondary | ICD-10-CM | POA: Diagnosis not present

## 2024-04-17 DIAGNOSIS — R569 Unspecified convulsions: Secondary | ICD-10-CM | POA: Diagnosis not present

## 2024-04-17 DIAGNOSIS — I129 Hypertensive chronic kidney disease with stage 1 through stage 4 chronic kidney disease, or unspecified chronic kidney disease: Secondary | ICD-10-CM | POA: Diagnosis not present

## 2024-04-17 DIAGNOSIS — G894 Chronic pain syndrome: Secondary | ICD-10-CM | POA: Diagnosis not present

## 2024-04-17 DIAGNOSIS — J45909 Unspecified asthma, uncomplicated: Secondary | ICD-10-CM | POA: Diagnosis not present

## 2024-04-17 DIAGNOSIS — Z853 Personal history of malignant neoplasm of breast: Secondary | ICD-10-CM | POA: Diagnosis not present

## 2024-04-18 DIAGNOSIS — G894 Chronic pain syndrome: Secondary | ICD-10-CM | POA: Diagnosis not present

## 2024-04-18 DIAGNOSIS — N1832 Chronic kidney disease, stage 3b: Secondary | ICD-10-CM | POA: Diagnosis not present

## 2024-04-18 DIAGNOSIS — R569 Unspecified convulsions: Secondary | ICD-10-CM | POA: Diagnosis not present

## 2024-04-18 DIAGNOSIS — G35 Multiple sclerosis: Secondary | ICD-10-CM | POA: Diagnosis not present

## 2024-04-18 DIAGNOSIS — I129 Hypertensive chronic kidney disease with stage 1 through stage 4 chronic kidney disease, or unspecified chronic kidney disease: Secondary | ICD-10-CM | POA: Diagnosis not present

## 2024-04-18 DIAGNOSIS — J45909 Unspecified asthma, uncomplicated: Secondary | ICD-10-CM | POA: Diagnosis not present

## 2024-04-26 DIAGNOSIS — G894 Chronic pain syndrome: Secondary | ICD-10-CM | POA: Diagnosis not present

## 2024-04-26 DIAGNOSIS — I129 Hypertensive chronic kidney disease with stage 1 through stage 4 chronic kidney disease, or unspecified chronic kidney disease: Secondary | ICD-10-CM | POA: Diagnosis not present

## 2024-04-26 DIAGNOSIS — N1832 Chronic kidney disease, stage 3b: Secondary | ICD-10-CM | POA: Diagnosis not present

## 2024-04-26 DIAGNOSIS — G35 Multiple sclerosis: Secondary | ICD-10-CM | POA: Diagnosis not present

## 2024-04-26 DIAGNOSIS — R569 Unspecified convulsions: Secondary | ICD-10-CM | POA: Diagnosis not present

## 2024-04-26 DIAGNOSIS — J45909 Unspecified asthma, uncomplicated: Secondary | ICD-10-CM | POA: Diagnosis not present

## 2024-04-27 DIAGNOSIS — G894 Chronic pain syndrome: Secondary | ICD-10-CM | POA: Diagnosis not present

## 2024-04-27 DIAGNOSIS — N1832 Chronic kidney disease, stage 3b: Secondary | ICD-10-CM | POA: Diagnosis not present

## 2024-04-27 DIAGNOSIS — I129 Hypertensive chronic kidney disease with stage 1 through stage 4 chronic kidney disease, or unspecified chronic kidney disease: Secondary | ICD-10-CM | POA: Diagnosis not present

## 2024-04-27 DIAGNOSIS — J45909 Unspecified asthma, uncomplicated: Secondary | ICD-10-CM | POA: Diagnosis not present

## 2024-04-27 DIAGNOSIS — G35 Multiple sclerosis: Secondary | ICD-10-CM | POA: Diagnosis not present

## 2024-04-27 DIAGNOSIS — R569 Unspecified convulsions: Secondary | ICD-10-CM | POA: Diagnosis not present

## 2024-05-01 DIAGNOSIS — G35 Multiple sclerosis: Secondary | ICD-10-CM | POA: Diagnosis not present

## 2024-05-01 DIAGNOSIS — R569 Unspecified convulsions: Secondary | ICD-10-CM | POA: Diagnosis not present

## 2024-05-01 DIAGNOSIS — J45909 Unspecified asthma, uncomplicated: Secondary | ICD-10-CM | POA: Diagnosis not present

## 2024-05-01 DIAGNOSIS — G894 Chronic pain syndrome: Secondary | ICD-10-CM | POA: Diagnosis not present

## 2024-05-01 DIAGNOSIS — N1832 Chronic kidney disease, stage 3b: Secondary | ICD-10-CM | POA: Diagnosis not present

## 2024-05-01 DIAGNOSIS — I129 Hypertensive chronic kidney disease with stage 1 through stage 4 chronic kidney disease, or unspecified chronic kidney disease: Secondary | ICD-10-CM | POA: Diagnosis not present

## 2024-05-03 DIAGNOSIS — J45909 Unspecified asthma, uncomplicated: Secondary | ICD-10-CM | POA: Diagnosis not present

## 2024-05-03 DIAGNOSIS — G35 Multiple sclerosis: Secondary | ICD-10-CM | POA: Diagnosis not present

## 2024-05-03 DIAGNOSIS — G894 Chronic pain syndrome: Secondary | ICD-10-CM | POA: Diagnosis not present

## 2024-05-03 DIAGNOSIS — N1832 Chronic kidney disease, stage 3b: Secondary | ICD-10-CM | POA: Diagnosis not present

## 2024-05-03 DIAGNOSIS — R569 Unspecified convulsions: Secondary | ICD-10-CM | POA: Diagnosis not present

## 2024-05-03 DIAGNOSIS — I129 Hypertensive chronic kidney disease with stage 1 through stage 4 chronic kidney disease, or unspecified chronic kidney disease: Secondary | ICD-10-CM | POA: Diagnosis not present

## 2024-05-05 IMAGING — CR DG CHEST 2V
2 series · 2 of 2 positions shown · non-contrast
Comparison: 12/18/2020

CLINICAL DATA: Acute lower respiratory infection.

EXAM:
CHEST - 2 VIEW

[w chest pa]
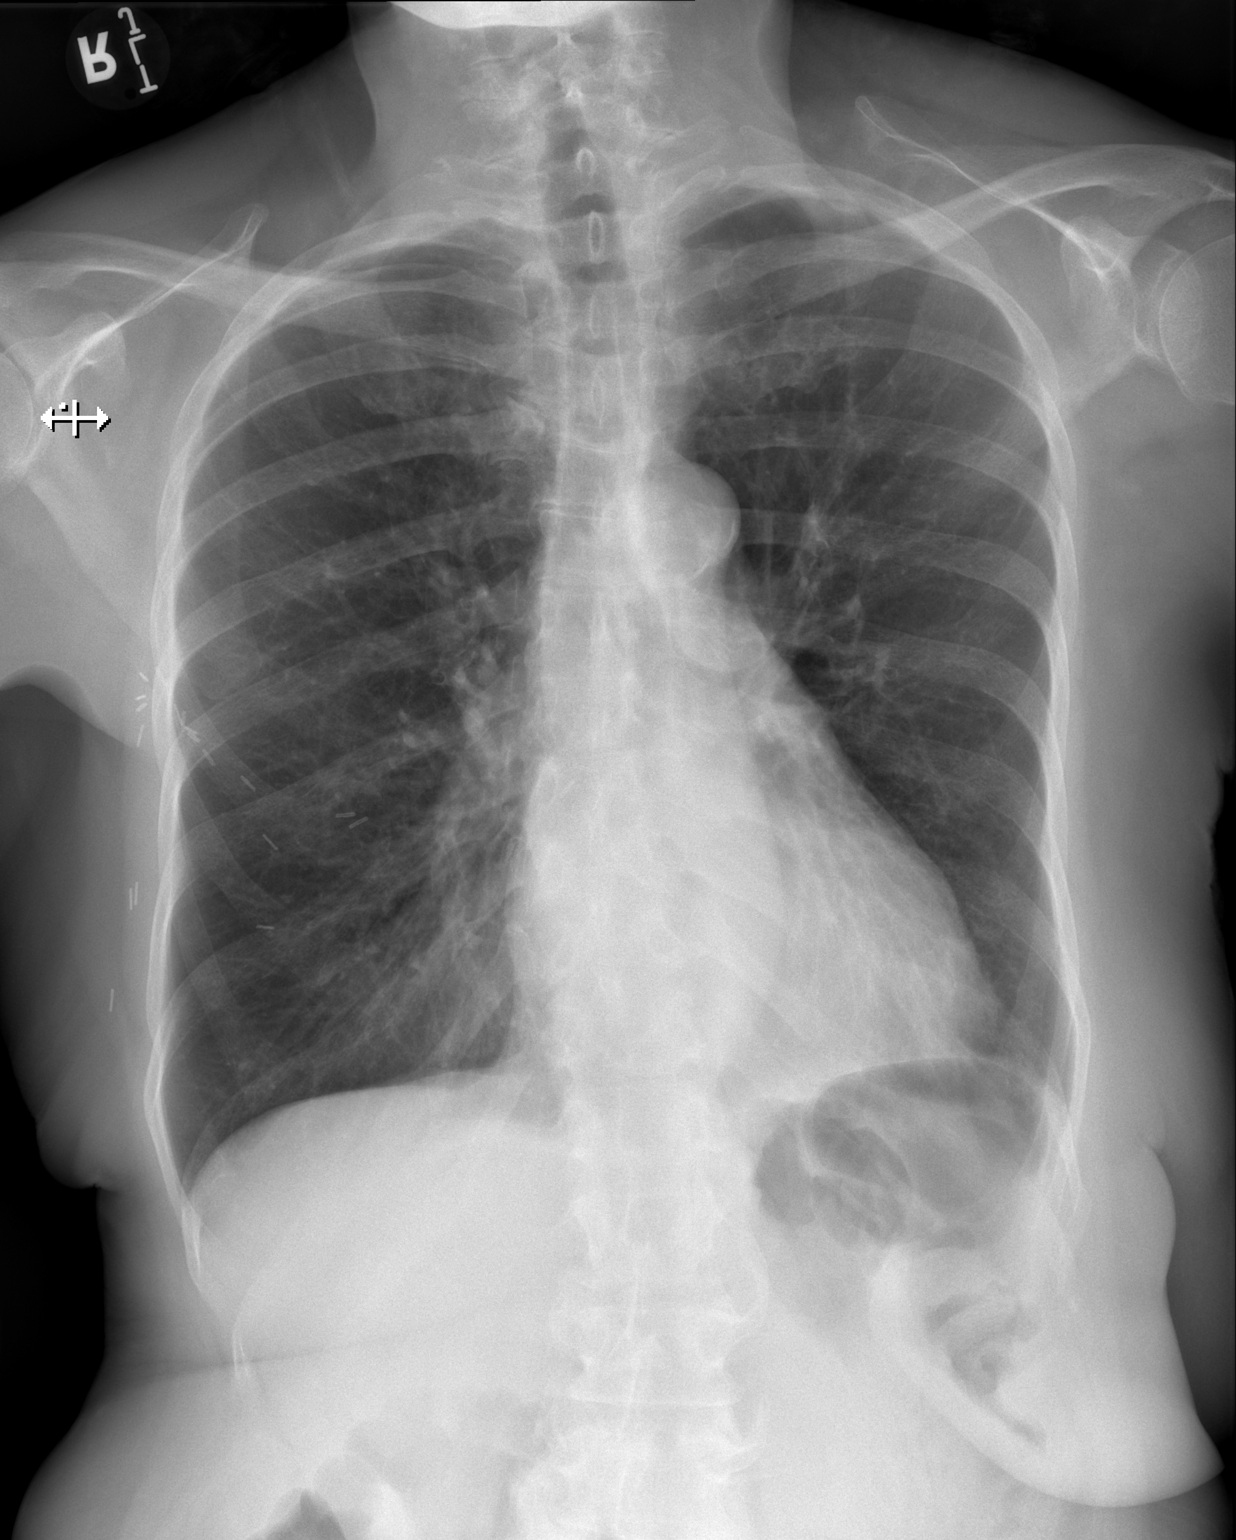

[w chest lat]
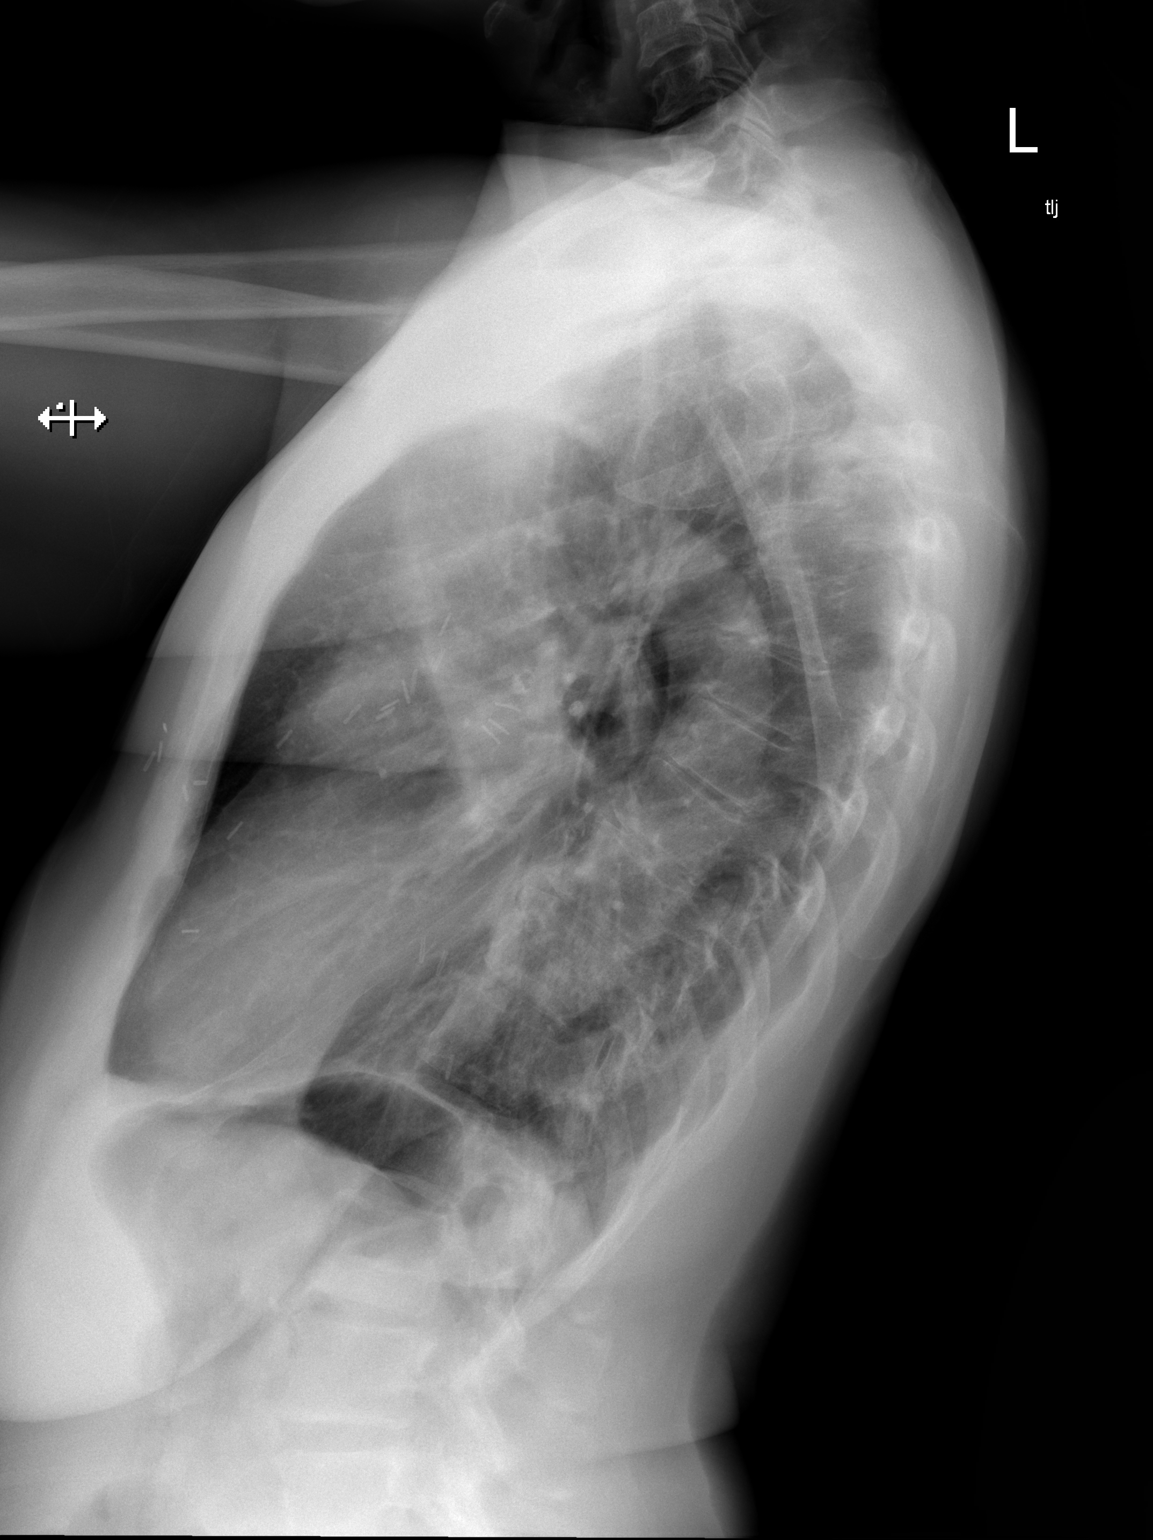

[2 of 2 positions shown; findings below may reference images not displayed]

FINDINGS: Normal cardiac silhouette. No effusion, infiltrate or pneumothorax.
Lymphadenectomy clips in the RIGHT axilla. Post RIGHT mastectomy
IMPRESSION: No acute cardiopulmonary process.

## 2024-05-09 DIAGNOSIS — R569 Unspecified convulsions: Secondary | ICD-10-CM | POA: Diagnosis not present

## 2024-05-09 DIAGNOSIS — N1832 Chronic kidney disease, stage 3b: Secondary | ICD-10-CM | POA: Diagnosis not present

## 2024-05-09 DIAGNOSIS — G894 Chronic pain syndrome: Secondary | ICD-10-CM | POA: Diagnosis not present

## 2024-05-09 DIAGNOSIS — G35 Multiple sclerosis: Secondary | ICD-10-CM | POA: Diagnosis not present

## 2024-05-09 DIAGNOSIS — J45909 Unspecified asthma, uncomplicated: Secondary | ICD-10-CM | POA: Diagnosis not present

## 2024-05-09 DIAGNOSIS — I129 Hypertensive chronic kidney disease with stage 1 through stage 4 chronic kidney disease, or unspecified chronic kidney disease: Secondary | ICD-10-CM | POA: Diagnosis not present

## 2024-05-10 DIAGNOSIS — I129 Hypertensive chronic kidney disease with stage 1 through stage 4 chronic kidney disease, or unspecified chronic kidney disease: Secondary | ICD-10-CM | POA: Diagnosis not present

## 2024-05-10 DIAGNOSIS — G35 Multiple sclerosis: Secondary | ICD-10-CM | POA: Diagnosis not present

## 2024-05-10 DIAGNOSIS — N1832 Chronic kidney disease, stage 3b: Secondary | ICD-10-CM | POA: Diagnosis not present

## 2024-05-10 DIAGNOSIS — G894 Chronic pain syndrome: Secondary | ICD-10-CM | POA: Diagnosis not present

## 2024-05-10 DIAGNOSIS — J45909 Unspecified asthma, uncomplicated: Secondary | ICD-10-CM | POA: Diagnosis not present

## 2024-05-10 DIAGNOSIS — R569 Unspecified convulsions: Secondary | ICD-10-CM | POA: Diagnosis not present

## 2024-05-14 DIAGNOSIS — N1832 Chronic kidney disease, stage 3b: Secondary | ICD-10-CM | POA: Diagnosis not present

## 2024-05-16 ENCOUNTER — Telehealth (HOSPITAL_BASED_OUTPATIENT_CLINIC_OR_DEPARTMENT_OTHER): Payer: Self-pay

## 2024-05-16 DIAGNOSIS — J45909 Unspecified asthma, uncomplicated: Secondary | ICD-10-CM | POA: Diagnosis not present

## 2024-05-16 DIAGNOSIS — I129 Hypertensive chronic kidney disease with stage 1 through stage 4 chronic kidney disease, or unspecified chronic kidney disease: Secondary | ICD-10-CM | POA: Diagnosis not present

## 2024-05-16 DIAGNOSIS — N1832 Chronic kidney disease, stage 3b: Secondary | ICD-10-CM | POA: Diagnosis not present

## 2024-05-16 DIAGNOSIS — G894 Chronic pain syndrome: Secondary | ICD-10-CM | POA: Diagnosis not present

## 2024-05-16 DIAGNOSIS — R569 Unspecified convulsions: Secondary | ICD-10-CM | POA: Diagnosis not present

## 2024-05-16 DIAGNOSIS — G35 Multiple sclerosis: Secondary | ICD-10-CM | POA: Diagnosis not present

## 2024-05-17 DIAGNOSIS — Z8616 Personal history of COVID-19: Secondary | ICD-10-CM | POA: Diagnosis not present

## 2024-05-17 DIAGNOSIS — E7800 Pure hypercholesterolemia, unspecified: Secondary | ICD-10-CM | POA: Diagnosis not present

## 2024-05-17 DIAGNOSIS — Z853 Personal history of malignant neoplasm of breast: Secondary | ICD-10-CM | POA: Diagnosis not present

## 2024-05-17 DIAGNOSIS — G629 Polyneuropathy, unspecified: Secondary | ICD-10-CM | POA: Diagnosis not present

## 2024-05-17 DIAGNOSIS — M21372 Foot drop, left foot: Secondary | ICD-10-CM | POA: Diagnosis not present

## 2024-05-17 DIAGNOSIS — I129 Hypertensive chronic kidney disease with stage 1 through stage 4 chronic kidney disease, or unspecified chronic kidney disease: Secondary | ICD-10-CM | POA: Diagnosis not present

## 2024-05-17 DIAGNOSIS — Z87891 Personal history of nicotine dependence: Secondary | ICD-10-CM | POA: Diagnosis not present

## 2024-05-17 DIAGNOSIS — Z556 Problems related to health literacy: Secondary | ICD-10-CM | POA: Diagnosis not present

## 2024-05-17 DIAGNOSIS — Z5982 Transportation insecurity: Secondary | ICD-10-CM | POA: Diagnosis not present

## 2024-05-17 DIAGNOSIS — G894 Chronic pain syndrome: Secondary | ICD-10-CM | POA: Diagnosis not present

## 2024-05-17 DIAGNOSIS — R569 Unspecified convulsions: Secondary | ICD-10-CM | POA: Diagnosis not present

## 2024-05-17 DIAGNOSIS — N1832 Chronic kidney disease, stage 3b: Secondary | ICD-10-CM | POA: Diagnosis not present

## 2024-05-17 DIAGNOSIS — E2839 Other primary ovarian failure: Secondary | ICD-10-CM | POA: Diagnosis not present

## 2024-05-17 DIAGNOSIS — G35D Multiple sclerosis, unspecified: Secondary | ICD-10-CM | POA: Diagnosis not present

## 2024-05-17 DIAGNOSIS — J45909 Unspecified asthma, uncomplicated: Secondary | ICD-10-CM | POA: Diagnosis not present

## 2024-05-22 DIAGNOSIS — I129 Hypertensive chronic kidney disease with stage 1 through stage 4 chronic kidney disease, or unspecified chronic kidney disease: Secondary | ICD-10-CM | POA: Diagnosis not present

## 2024-05-22 DIAGNOSIS — N1832 Chronic kidney disease, stage 3b: Secondary | ICD-10-CM | POA: Diagnosis not present

## 2024-05-22 DIAGNOSIS — R569 Unspecified convulsions: Secondary | ICD-10-CM | POA: Diagnosis not present

## 2024-05-22 DIAGNOSIS — J45909 Unspecified asthma, uncomplicated: Secondary | ICD-10-CM | POA: Diagnosis not present

## 2024-05-22 DIAGNOSIS — G894 Chronic pain syndrome: Secondary | ICD-10-CM | POA: Diagnosis not present

## 2024-05-22 DIAGNOSIS — G35D Multiple sclerosis, unspecified: Secondary | ICD-10-CM | POA: Diagnosis not present

## 2024-05-24 DIAGNOSIS — Z23 Encounter for immunization: Secondary | ICD-10-CM | POA: Diagnosis not present

## 2024-05-24 DIAGNOSIS — G894 Chronic pain syndrome: Secondary | ICD-10-CM | POA: Diagnosis not present

## 2024-05-24 DIAGNOSIS — G35D Multiple sclerosis, unspecified: Secondary | ICD-10-CM | POA: Diagnosis not present

## 2024-05-24 DIAGNOSIS — R569 Unspecified convulsions: Secondary | ICD-10-CM | POA: Diagnosis not present

## 2024-05-24 DIAGNOSIS — N1832 Chronic kidney disease, stage 3b: Secondary | ICD-10-CM | POA: Diagnosis not present

## 2024-05-24 DIAGNOSIS — J45909 Unspecified asthma, uncomplicated: Secondary | ICD-10-CM | POA: Diagnosis not present

## 2024-05-24 DIAGNOSIS — I129 Hypertensive chronic kidney disease with stage 1 through stage 4 chronic kidney disease, or unspecified chronic kidney disease: Secondary | ICD-10-CM | POA: Diagnosis not present

## 2024-05-31 DIAGNOSIS — G894 Chronic pain syndrome: Secondary | ICD-10-CM | POA: Diagnosis not present

## 2024-05-31 DIAGNOSIS — I129 Hypertensive chronic kidney disease with stage 1 through stage 4 chronic kidney disease, or unspecified chronic kidney disease: Secondary | ICD-10-CM | POA: Diagnosis not present

## 2024-05-31 DIAGNOSIS — G35D Multiple sclerosis, unspecified: Secondary | ICD-10-CM | POA: Diagnosis not present

## 2024-05-31 DIAGNOSIS — N1832 Chronic kidney disease, stage 3b: Secondary | ICD-10-CM | POA: Diagnosis not present

## 2024-05-31 DIAGNOSIS — J45909 Unspecified asthma, uncomplicated: Secondary | ICD-10-CM | POA: Diagnosis not present

## 2024-05-31 DIAGNOSIS — R569 Unspecified convulsions: Secondary | ICD-10-CM | POA: Diagnosis not present

## 2024-06-06 DIAGNOSIS — N1832 Chronic kidney disease, stage 3b: Secondary | ICD-10-CM | POA: Diagnosis not present

## 2024-06-06 DIAGNOSIS — J45909 Unspecified asthma, uncomplicated: Secondary | ICD-10-CM | POA: Diagnosis not present

## 2024-06-06 DIAGNOSIS — I129 Hypertensive chronic kidney disease with stage 1 through stage 4 chronic kidney disease, or unspecified chronic kidney disease: Secondary | ICD-10-CM | POA: Diagnosis not present

## 2024-06-06 DIAGNOSIS — G894 Chronic pain syndrome: Secondary | ICD-10-CM | POA: Diagnosis not present

## 2024-06-06 DIAGNOSIS — R569 Unspecified convulsions: Secondary | ICD-10-CM | POA: Diagnosis not present

## 2024-06-06 DIAGNOSIS — G35D Multiple sclerosis, unspecified: Secondary | ICD-10-CM | POA: Diagnosis not present

## 2024-06-08 NOTE — Progress Notes (Unsigned)
 Subjective:    Patient ID: Selena Bender, female    DOB: [birth year], 73 y.o.   MRN: [id number]  HPI: Selena Bender is a 73 y.o. female who returns for follow up appointment for chronic pain and medication refill. states *** pain is located in  ***. rates pain ***. current exercise regime is walking and performing stretching exercises.  Ms. Bender Morphine equivalent is *** MME.   Last Oral Swab was Performed on 04/11/2024,      Pain Inventory Average Pain 8 Pain Right Now 7 My pain is constant, burning, and tingling  In the last 24 hours, has pain interfered with the following? General activity 8 Relation with others 8 Enjoyment of life 6 What TIME of day is your pain at its worst? morning , daytime, and evening Sleep (in general) Good  Pain is worse with: sitting and some activites Pain improves with: rest, therapy/exercise, and medication Relief from Meds: 8  Family History  Problem Relation Age of Onset   Sickle cell anemia Father    Breast cancer Neg Hx    Social History   Socioeconomic History   Marital status: Widowed    Spouse name: Not on file   Number of children: 2   Years of education: Not on file   Highest education level: Not on file  Occupational History   Not on file  Tobacco Use   Smoking status: Former    Types: Cigarettes   Smokeless tobacco: Never  Vaping Use   Vaping status: Never Used  Substance and Sexual Activity   Alcohol use: Yes    Comment: occ   Drug use: Not Currently    Types: Marijuana    Comment: Gummies   Sexual activity: Not Currently  Other Topics Concern   Not on file  Social History Narrative   Not on file   Social Drivers of Health   Financial Resource Strain: Not on file  Food Insecurity: Not on file  Transportation Needs: Not on file  Physical Activity: Not on file  Stress: Not on file  Social Connections: Not on file   Past Surgical History:  Procedure Laterality Date   BREAST SURGERY      MASTECTOMY Right 2014   Past Surgical History:  Procedure Laterality Date   BREAST SURGERY     MASTECTOMY Right 2014   Past Medical History:  Diagnosis Date   Asthma    Breast cancer (HCC) 2014   Right   Chicken pox    CKD (chronic kidney disease), stage III (HCC)    COVID-19 virus infection 12/2020   Hypertension    Multiple sclerosis    Seizures (HCC)    There were no vitals taken for this visit.  Opioid Risk Score:   Fall Risk Score:  `1  Depression screen [facility] 2/9     04/11/2024   10:58 AM 09/27/2023   11:53 AM 07/27/2023   10:59 AM 04/25/2023   11:20 AM 01/26/2023    9:47 AM 01/14/2023    1:18 PM 09/03/2022   10:54 AM  Depression screen PHQ 2/9  Decreased Interest 1 1 2 2 2 2  0  Down, Depressed, Hopeless 1 1 2 2 1 2  0  PHQ - 2 Score 2 2 4 4 3 4  0  Altered sleeping      2   Tired, decreased energy      3   Change in appetite      3   Feeling bad  or failure about yourself       0   Trouble concentrating      1   Moving slowly or fidgety/restless      0   Suicidal thoughts      0   PHQ-9 Score      13   Difficult doing work/chores      Very difficult     Review of Systems  Musculoskeletal:  Positive for gait problem.       Pain in both hips down to both feet  All other systems reviewed and are negative.      Objective:   Physical Exam        Assessment & Plan:

## 2024-06-11 ENCOUNTER — Encounter: Attending: Physical Medicine and Rehabilitation | Admitting: Registered Nurse

## 2024-06-11 ENCOUNTER — Encounter: Payer: Self-pay | Admitting: Registered Nurse

## 2024-06-11 VITALS — BP 118/75 | HR 83 | Ht 66.0 in | Wt 142.0 lb

## 2024-06-11 DIAGNOSIS — Z5181 Encounter for therapeutic drug level monitoring: Secondary | ICD-10-CM | POA: Diagnosis not present

## 2024-06-11 DIAGNOSIS — M25552 Pain in left hip: Secondary | ICD-10-CM | POA: Insufficient documentation

## 2024-06-11 DIAGNOSIS — M5410 Radiculopathy, site unspecified: Secondary | ICD-10-CM | POA: Diagnosis not present

## 2024-06-11 DIAGNOSIS — Z79891 Long term (current) use of opiate analgesic: Secondary | ICD-10-CM | POA: Insufficient documentation

## 2024-06-11 DIAGNOSIS — M545 Low back pain, unspecified: Secondary | ICD-10-CM | POA: Insufficient documentation

## 2024-06-11 DIAGNOSIS — G8929 Other chronic pain: Secondary | ICD-10-CM | POA: Insufficient documentation

## 2024-06-11 DIAGNOSIS — G894 Chronic pain syndrome: Secondary | ICD-10-CM | POA: Insufficient documentation

## 2024-06-11 MED ORDER — HYDROCODONE-ACETAMINOPHEN 10-325 MG PO TABS: 1.0000 | ORAL_TABLET | Freq: Two times a day (BID) | ORAL | 0 refills | Status: DC | PRN

## 2024-06-12 DIAGNOSIS — J45909 Unspecified asthma, uncomplicated: Secondary | ICD-10-CM | POA: Diagnosis not present

## 2024-06-12 DIAGNOSIS — G894 Chronic pain syndrome: Secondary | ICD-10-CM | POA: Diagnosis not present

## 2024-06-12 DIAGNOSIS — R569 Unspecified convulsions: Secondary | ICD-10-CM | POA: Diagnosis not present

## 2024-06-12 DIAGNOSIS — I129 Hypertensive chronic kidney disease with stage 1 through stage 4 chronic kidney disease, or unspecified chronic kidney disease: Secondary | ICD-10-CM | POA: Diagnosis not present

## 2024-06-12 DIAGNOSIS — G35D Multiple sclerosis, unspecified: Secondary | ICD-10-CM | POA: Diagnosis not present

## 2024-06-12 DIAGNOSIS — N1832 Chronic kidney disease, stage 3b: Secondary | ICD-10-CM | POA: Diagnosis not present

## 2024-06-15 DIAGNOSIS — G35D Multiple sclerosis, unspecified: Secondary | ICD-10-CM | POA: Diagnosis not present

## 2024-06-15 DIAGNOSIS — I129 Hypertensive chronic kidney disease with stage 1 through stage 4 chronic kidney disease, or unspecified chronic kidney disease: Secondary | ICD-10-CM | POA: Diagnosis not present

## 2024-06-15 DIAGNOSIS — N1832 Chronic kidney disease, stage 3b: Secondary | ICD-10-CM | POA: Diagnosis not present

## 2024-06-15 DIAGNOSIS — G894 Chronic pain syndrome: Secondary | ICD-10-CM | POA: Diagnosis not present

## 2024-06-15 DIAGNOSIS — R569 Unspecified convulsions: Secondary | ICD-10-CM | POA: Diagnosis not present

## 2024-06-15 DIAGNOSIS — J45909 Unspecified asthma, uncomplicated: Secondary | ICD-10-CM | POA: Diagnosis not present

## 2024-07-02 ENCOUNTER — Other Ambulatory Visit: Payer: Self-pay | Admitting: Family Medicine

## 2024-07-02 DIAGNOSIS — Z1231 Encounter for screening mammogram for malignant neoplasm of breast: Secondary | ICD-10-CM

## 2024-07-09 ENCOUNTER — Ambulatory Visit

## 2024-07-27 ENCOUNTER — Inpatient Hospital Stay: Admission: RE | Admit: 2024-07-27 | Discharge: 2024-07-27 | Attending: Family Medicine

## 2024-07-27 DIAGNOSIS — Z1231 Encounter for screening mammogram for malignant neoplasm of breast: Secondary | ICD-10-CM

## 2024-08-07 ENCOUNTER — Other Ambulatory Visit: Payer: Self-pay

## 2024-08-07 MED ORDER — HYDROCODONE-ACETAMINOPHEN 10-325 MG PO TABS: 1.0000 | ORAL_TABLET | Freq: Two times a day (BID) | ORAL | 0 refills | Status: AC | PRN

## 2024-08-13 ENCOUNTER — Encounter: Attending: Physical Medicine and Rehabilitation | Admitting: Physical Medicine and Rehabilitation

## 2024-08-13 ENCOUNTER — Encounter: Payer: Self-pay | Admitting: Physical Medicine and Rehabilitation

## 2024-08-13 DIAGNOSIS — M545 Low back pain, unspecified: Secondary | ICD-10-CM | POA: Diagnosis not present

## 2024-08-13 DIAGNOSIS — M25552 Pain in left hip: Secondary | ICD-10-CM | POA: Diagnosis not present

## 2024-08-13 DIAGNOSIS — G8929 Other chronic pain: Secondary | ICD-10-CM | POA: Diagnosis present

## 2024-08-13 DIAGNOSIS — G894 Chronic pain syndrome: Secondary | ICD-10-CM | POA: Insufficient documentation

## 2024-08-13 MED ORDER — HYDROCODONE-ACETAMINOPHEN 10-325 MG PO TABS: 1.0000 | ORAL_TABLET | Freq: Two times a day (BID) | ORAL | 0 refills | Status: AC | PRN

## 2024-08-13 MED ORDER — BUPRENORPHINE 7.5 MCG/HR TD PTWK: 1.0000 | MEDICATED_PATCH | TRANSDERMAL | 5 refills | Status: AC

## 2024-08-13 NOTE — Patient Instructions (Signed)
 Pt is a 74 yr old R handed female with hx of relapsing remitting MS and breast CA and seizures here for evaluation of chronic pain in B/L arms and chest due to mastectomy- dx'd 2014- and in remission.  A lot of nerve pain, likely from MS.  Here for f/u on chronic pain/MS.       Butrans  patch- 7.5 mcg/day since has failed just Norco- don't want to increase Norco since would just keep increasing- since doesn't have Good coverage for nerve pain.  Will need to do prior auth for this medicine.  Have to get in your system- so should notice improvement in 2-3 days, maximal improvement at 1 week, when you change the patch.      2. Stop Tramadol -  but continue Norco-  as needed for pain-      3. Constipation- Colace is not enough- Senna - 1-3 tabs/day- if needs to go to 2-3 tabs/day space it out- so 1 tab 2x/day or 1 tab in AM and 2 tabs in evening.    4. Is running out of pain meds lately since hurting more, so I'm hopeful this will compensate for this. Since most pain is neuropathic for her, so this will work better than traditional opiates as well   5. F/U in 2 months with Fidela and 4 moths with me

## 2024-08-13 NOTE — Progress Notes (Signed)
 "  Subjective:    Patient ID: Selena Bender, female    DOB: April 28, 1951, 74 y.o.   MRN: [id number]  HPI Pt is a 74 yr old R handed female with hx of relapsing remitting MS and breast CA and seizures here for evaluation of chronic pain in B/L arms and chest due to mastectomy- dx'd 2014- and in remission.  A lot of nerve pain, likely from MS.  Here for f/u on chronic pain/MS.    Pain is getting worse.   Has pain that's getting worse- feels like crying sometimes.    Also having issues with constipation.     Pain Inventory Average Pain 8 Pain Right Now 7 My pain is na  In the last 24 hours, has pain interfered with the following? General activity 7 Relation with others 7 Enjoyment of life 7 What TIME of day is your pain at its worst? morning , daytime, and evening Sleep (in general) Good  Pain is worse with: walking, sitting, inactivity, and standing Pain improves with: rest and medication Relief from Meds: na  Family History  Problem Relation Age of Onset   Sickle cell anemia Father    Breast cancer Neg Hx    Social History   Socioeconomic History   Marital status: Widowed    Spouse name: Not on file   Number of children: 2   Years of education: Not on file   Highest education level: Not on file  Occupational History   Not on file  Tobacco Use   Smoking status: Former    Types: Cigarettes   Smokeless tobacco: Never  Vaping Use   Vaping status: Never Used  Substance and Sexual Activity   Alcohol use: Yes    Comment: occ   Drug use: Not Currently    Types: Marijuana    Comment: Gummies   Sexual activity: Not Currently  Other Topics Concern   Not on file  Social History Narrative   Not on file   Social Drivers of Health   Tobacco Use: Medium Risk (08/13/2024)   Patient History    Smoking Tobacco Use: Former    Smokeless Tobacco Use: Never    Passive Exposure: Not on Actuary Strain: Not on file  Food Insecurity: Not on file   Transportation Needs: Not on file  Physical Activity: Not on file  Stress: Not on file  Social Connections: Not on file  Depression (PHQ2-9): Low Risk (08/13/2024)   Depression (PHQ2-9)    PHQ-2 Score: 2  Alcohol Screen: Not on file  Housing: Not on file  Utilities: Not on file  Health Literacy: Not on file   Past Surgical History:  Procedure Laterality Date   BREAST SURGERY     MASTECTOMY Right 2014   Past Surgical History:  Procedure Laterality Date   BREAST SURGERY     MASTECTOMY Right 2014   Past Medical History:  Diagnosis Date   Asthma    Breast cancer (HCC) 2014   Right   Chicken pox    CKD (chronic kidney disease), stage III (HCC)    COVID-19 virus infection 12/2020   Hypertension    Multiple sclerosis    Seizures (HCC)    BP (!) 150/82   Pulse 93   Ht 5' 6 (1.676 m)   Wt 140 lb 12.8 oz (63.9 kg)   SpO2 95%   BMI 22.73 kg/m   Opioid Risk Score:   Fall Risk Score:  `1  Depression screen  PHQ 2/9     08/13/2024   10:23 AM 06/11/2024   11:06 AM 04/11/2024   10:58 AM 09/27/2023   11:53 AM 07/27/2023   10:59 AM 04/25/2023   11:20 AM 01/26/2023    9:47 AM  Depression screen PHQ 2/9  Decreased Interest 1 1 1 1 2 2 2   Down, Depressed, Hopeless 1 1 1 1 2 2 1   PHQ - 2 Score 2 2 2 2 4 4 3      Review of Systems  Musculoskeletal:  Positive for gait problem.  All other systems reviewed and are negative.      Objective:   Physical Exam   Awake, alert- appropriate, using quad cane to walk; accompanied by daughter, NAD Main pain is neuropathic      Assessment & Plan:   Pt is a 74 yr old R handed female with hx of relapsing remitting MS and breast CA and seizures here for evaluation of chronic pain in B/L arms and chest due to mastectomy- dx'd 2014- and in remission.  A lot of nerve pain, likely from MS.  Here for f/u on chronic pain/MS.     Butrans  patch- 7.5 mcg/day since has failed just Norco- don't want to increase Norco since would just keep  increasing- since doesn't have Good coverage for nerve pain.  Will need to do prior auth for this medicine.  Have to get in your system- so should notice improvement in 2-3 days, maximal improvement at 1 week, when you change the patch.    2. Stop Tramadol -  but continue Norco-  as needed for pain-    3. Constipation- Colace is not enough- Senna - 1-3 tabs/day- if needs to go to 2-3 tabs/day space it out- so 1 tab 2x/day or 1 tab in AM and 2 tabs in evening.   4. Is running out of pain meds lately since hurting more, so I'm hopeful this will compensate for this. Since most pain is neuropathic for her, so this will work better than traditional opiates as well  5. F/U in 2 months with Fidela and 4 moths with me   I spent a total of 23   minutes on total care today- >50% coordination of care- due to d/w pt about options for pain meds- decided to do Butrans  and discussed risks/benefits-    "

## 2024-10-11 ENCOUNTER — Encounter: Admitting: Registered Nurse

## 2024-12-10 ENCOUNTER — Other Ambulatory Visit (HOSPITAL_BASED_OUTPATIENT_CLINIC_OR_DEPARTMENT_OTHER)

## 2024-12-12 ENCOUNTER — Encounter: Admitting: Physical Medicine and Rehabilitation

## 2025-03-28 ENCOUNTER — Ambulatory Visit: Admitting: Neurology
